# Patient Record
Sex: Male | Born: 1940
Health system: Southern US, Community
[De-identification: ages and names within clinical notes are randomized; demographics above are authoritative.]

## PROBLEM LIST (undated history)

## (undated) DIAGNOSIS — M1710 Unilateral primary osteoarthritis, unspecified knee: Secondary | ICD-10-CM

## (undated) DIAGNOSIS — Z86010 Personal history of colonic polyps: Secondary | ICD-10-CM

## (undated) DIAGNOSIS — E119 Type 2 diabetes mellitus without complications: Secondary | ICD-10-CM

## (undated) DIAGNOSIS — E785 Hyperlipidemia, unspecified: Secondary | ICD-10-CM

## (undated) DIAGNOSIS — H47019 Ischemic optic neuropathy, unspecified eye: Secondary | ICD-10-CM

## (undated) HISTORY — DX: Hyperlipidemia, unspecified: E78.5

## (undated) HISTORY — DX: Personal history of colonic polyps: Z86.010

## (undated) HISTORY — DX: Unilateral primary osteoarthritis, unspecified knee: M17.10

## (undated) HISTORY — DX: Ischemic optic neuropathy, unspecified eye: H47.019

## (undated) HISTORY — DX: Type 2 diabetes mellitus without complications: E11.9

---

## 2002-09-20 ENCOUNTER — Encounter: Payer: Self-pay | Admitting: Orthopedic Surgery

## 2002-09-20 ENCOUNTER — Inpatient Hospital Stay (HOSPITAL_COMMUNITY): Admission: RE | Admit: 2002-09-20 | Discharge: 2002-09-23 | Payer: Self-pay | Admitting: Orthopedic Surgery

## 2002-12-14 ENCOUNTER — Encounter: Payer: Self-pay | Admitting: Orthopedic Surgery

## 2002-12-14 ENCOUNTER — Encounter: Admission: RE | Admit: 2002-12-14 | Discharge: 2002-12-14 | Payer: Self-pay | Admitting: Orthopedic Surgery

## 2003-12-29 DIAGNOSIS — Z96652 Presence of left artificial knee joint: Secondary | ICD-10-CM | POA: Insufficient documentation

## 2003-12-29 HISTORY — PX: TOTAL KNEE ARTHROPLASTY: SHX125

## 2004-12-28 DIAGNOSIS — H47019 Ischemic optic neuropathy, unspecified eye: Secondary | ICD-10-CM

## 2004-12-28 HISTORY — DX: Ischemic optic neuropathy, unspecified eye: H47.019

## 2006-07-21 ENCOUNTER — Ambulatory Visit: Payer: Self-pay

## 2008-02-23 ENCOUNTER — Ambulatory Visit (HOSPITAL_COMMUNITY): Admission: RE | Admit: 2008-02-23 | Discharge: 2008-02-23 | Payer: Self-pay

## 2008-07-16 ENCOUNTER — Ambulatory Visit: Payer: Self-pay | Admitting: Internal Medicine

## 2008-07-30 ENCOUNTER — Ambulatory Visit: Payer: Self-pay | Admitting: Internal Medicine

## 2008-07-30 ENCOUNTER — Encounter: Payer: Self-pay | Admitting: Internal Medicine

## 2008-07-30 DIAGNOSIS — Z860100 Personal history of colon polyps, unspecified: Secondary | ICD-10-CM

## 2008-07-30 HISTORY — DX: Personal history of colonic polyps: Z86.010

## 2008-07-30 HISTORY — DX: Personal history of colon polyps, unspecified: Z86.0100

## 2008-08-01 ENCOUNTER — Encounter: Payer: Self-pay | Admitting: Internal Medicine

## 2008-09-27 ENCOUNTER — Ambulatory Visit: Payer: Self-pay | Admitting: Ophthalmology

## 2008-10-31 ENCOUNTER — Ambulatory Visit: Payer: Self-pay | Admitting: Ophthalmology

## 2011-04-09 ENCOUNTER — Ambulatory Visit: Payer: Self-pay

## 2011-05-15 NOTE — Op Note (Signed)
NAME:  Keith Jordan, Keith Jordan                          ACCOUNT NO.:  [id number]   MEDICAL RECORD NO.:  [id number]                   PATIENT TYPE:  INP   LOCATION:  5039                                 FACILITY:  [facility]   PHYSICIAN:  Elana Alm. Thurston Hole, M.D.              DATE OF BIRTH:  1941-05-11   DATE OF PROCEDURE:  09/20/2002  DATE OF DISCHARGE:                                 OPERATIVE REPORT   PREOPERATIVE DIAGNOSIS:  Left knee degenerative joint disease.   POSTOPERATIVE DIAGNOSIS:  Left knee degenerative joint disease.   PROCEDURE:  Left total knee replacement using Osteonics Scorpio total knee  system with #9 cemented femoral component, #9 cemented tibial component with  12 mm polyethylene tibial flex spacer, and 26-mm polyethylene cemented  patella.   SURGEON:  Elana Alm. Thurston Hole, M.D.   ASSISTANT:  Julien Girt, P.A.   ANESTHESIA:  General.   OPERATIVE TIME:  One hour and 20 minutes.   COMPLICATIONS:  None.   DESCRIPTION OF PROCEDURE:  The patient was brought to the operating room on  September 20, 2002, and placed on the operative table in the supine  position.  After an adequate level of general anesthesia was obtained, his  left knee was examined under anesthesia.  He had range of motion from -5 to  135 degrees with moderate varus deformity.  The stable was stable to  ligamentous exam with normal patella tracking.  He had a Foley catheter  placed under sterile conditions and received Ancef 1 g IV preoperatively for  prophylaxis.  His left leg was prepped using sterile DuraPrep and draped  using sterile technique.  The leg was exsanguinated and a tourniquet  elevated 350 mm.  Initially through a 20 cm longitudinal incision based over  the patella, initial exposure was made.  The underlying subcutaneous tissues  were incised in line with the skin incision.  A median arthrotomy was  performed revealing an excessive amount of normal appearing joint fluid.  The articular  surfaces were inspected.  He had grade IV changes medially,  grade III changes laterally, and grade III and IV changes in the  patellofemoral joint.  The medial and lateral meniscal remnants were removed  as well as the anterior cruciate ligament and the osteophytes removed off  the femoral condyles and tibial plateau.  Intramedullary drill was drilled  up the femoral canal for placement of the distal femoral cutting jig which  was placed in the appropriate amount of rotation and a distal 12 mm cut was  made.  The distal femur was incised.  The #9 was found to be the appropriate  size and a #9 cutting jig was placed and these cuts were made.  The proximal  tibia was exposed.  The tibial spines were removed with an oscillating saw.  An intramedullary drill drilled down the tibial canal for placement of the  proximal tibial cutting jig  and then a 4 mm proximal tibial cut was made in  the appropriate rotation.  After this was done, the Scorpio PCL cutter was  placed back on the femur and these cuts were made.  At this point then, the  #9 femoral trial was placed and the #9 tibial baseplate trial was placed  with a 12 mm polyethylene spacer.  This gave excellent alignment, excellent  stability and range of motion from 0 to 130 degrees.  The tibial baseplate  was then marked for rotation and the keel cut was made.  After this was  done, the patella was sized.  A 26 mm was found to be appropriate size and a  recessed 10 mm x 26 mm cut was made and three locking holes were placed.  After this was done, it was felt that all trial components were excellent  size, fit and stability and they were removed.  The knee was then jet  lavaged irrigated with three liters of saline solution.  The proximal tibia  was then exposed.  The #9 tibial baseplate was cemented back and was then  hammered into position with an excellent fit with excess cement being  removed from around the edges.  The #9 femoral  component was cemented back  and was hammered into position, also with an excellent fit with excess  cement being removed from around the edges.  The 12 mm polyethylene spacer  Omniflex was placed and locked on the tibial baseplate with an excellent  fit.  Range of motion tested 0 to 130 degrees with excellent stability and  correction of his varus malalignment.  The 26 mm polyethylene patella was  then locked into its recessed hole and held there with a clamp.  After the  cement hardened, patellofemoral tracking was evaluated and this was found to  be normal.  At this point, it was felt that all the components were with  excellent size, fit and stability.  Then he was further irrigated with  antibiotic solution.  The arthrotomy was closed with #1 Ethilon suture over  two median Hemovac drains.  Subcutaneous tissue was closed with 0 and 2-0  Vicryl, skin closed with skin staples, and sterile dressings were applied.  The Hemovac injected with 0.25% Marcaine with epinephrine and clamped.  The  tourniquet was released.  The patient then had a femoral nerve block placed  by Anesthesia for postoperative pain control.  He was then awakened and  taken to the recovery room in stable condition.  Needle and sponge counts  were correct x2 at the end of the case.                                               Robert A. Thurston Hole, M.D.    RAW/MEDQ  D:  09/20/2002  T:  09/22/2002  Job:  [id number]

## 2011-05-15 NOTE — Discharge Summary (Signed)
NAME:  LACEY, DOTSON                          ACCOUNT NO.:  [id number]   MEDICAL RECORD NO.:  [id number]                   PATIENT TYPE:  INP   LOCATION:  5039                                 FACILITY:  [facility]   PHYSICIAN:  Elana Alm. Thurston Hole, M.D.              DATE OF BIRTH:  14-Apr-1941   DATE OF ADMISSION:  09/20/2002  DATE OF DISCHARGE:  09/23/2002                                 DISCHARGE SUMMARY   ADMISSION DIAGNOSES:  1. Left knee end-stage degenerative joint disease.  2. High cholesterol.   DISCHARGE DIAGNOSES:  1. End-stage degenerative joint disease, left knee.  2. High cholesterol.   HISTORY OF PRESENT ILLNESS:  The patient is a 70 year old white male with a  history of bilateral end-stage DJD, left greater than right.  He has tried  anti-inflammatories, cortisone injections, and rest without success.  At  this point in time, he has pain at night, pain during the day, and pain at  rest.  He understands the risks, benefits, and possible complications of a  left total knee replacement and is without question.   HOSPITAL COURSE:  On September 20, 2002, the patient underwent a left total  knee replacement by Molly Maduro A. Thurston Hole, M.D.  He tolerated the procedure well.  On postoperative day #1, the patient was doing well without complaints.  Pain was under control with a PCA.  The hemoglobin was 12.7.  The INR was  1.3.  His drain was discontinued.  The dressing was changed.  The surgical  wound was well approximated.  TED hose were placed.  On postoperative day  #2, the patient took five steps, temperature maximum 101.1 degrees,  temperature now 98.2 degrees, the surgical wound was well approximated, his  hemoglobin was 12.6, his INR was 1.3, and he was metabolically stable with a  slightly high glucose of 173.  On postoperative day #3, the patient  progressed well, had no complaints, his surgical wound was well  approximated, his hemoglobin was 11.5, his INR was 1.2, he was  metabolically  stable with a slightly high hemoglobin of 121, and no other abnormalities.   DISPOSITION:  He was discharged to home with home health physical therapy,  home health nursing, a three-in-one bedside commode, a tub seat, and a  standard walker.  He was discharged to his son's home in [place], [place].   FOLLOW-UP:  He will follow up with Molly Maduro A. Thurston Hole, M.D., in two weeks.  The results of his next Coumadin draw will be Tuesday with results called to  Julien Girt, P.A., Dr. Eliott Nine., for Coumadin adjustment.  He  will follow up in our office in a week-and-a-half.     Kirstin Shepperson, P.A.                  Robert A. Thurston Hole, M.D.    KS/MEDQ  D:  10/03/2002  T:  10/06/2002  Job:  [id number]

## 2011-09-18 LAB — CREATININE, SERUM: Creatinine, Ser: 1

## 2011-09-18 LAB — BUN: BUN: 21

## 2012-02-09 ENCOUNTER — Ambulatory Visit (HOSPITAL_COMMUNITY)
Admission: RE | Admit: 2012-02-09 | Discharge: 2012-02-09 | Disposition: A | Discharge status: Home / Self Care | Payer: Medicare Other | Source: Ambulatory Visit

## 2012-02-09 ENCOUNTER — Other Ambulatory Visit (HOSPITAL_COMMUNITY): Payer: Self-pay

## 2012-02-09 DIAGNOSIS — R05 Cough: Secondary | ICD-10-CM

## 2012-02-09 DIAGNOSIS — R0989 Other specified symptoms and signs involving the circulatory and respiratory systems: Secondary | ICD-10-CM | POA: Insufficient documentation

## 2012-02-09 DIAGNOSIS — R059 Cough, unspecified: Secondary | ICD-10-CM | POA: Insufficient documentation

## 2013-07-27 ENCOUNTER — Encounter: Payer: Self-pay | Admitting: Internal Medicine

## 2013-09-26 ENCOUNTER — Encounter: Payer: Self-pay | Admitting: Internal Medicine

## 2013-10-18 ENCOUNTER — Encounter: Payer: Self-pay | Admitting: Internal Medicine

## 2013-12-07 ENCOUNTER — Encounter: Payer: Medicare Other | Admitting: Internal Medicine

## 2013-12-15 ENCOUNTER — Ambulatory Visit (AMBULATORY_SURGERY_CENTER): Payer: Medicare Other

## 2013-12-15 VITALS — Ht 70.0 in | Wt 160.0 lb

## 2013-12-15 DIAGNOSIS — Z860100 Personal history of colon polyps, unspecified: Secondary | ICD-10-CM

## 2013-12-15 MED ORDER — SUPREP BOWEL PREP KIT 17.5-3.13-1.6 GM/177ML PO SOLN: 1.0000 | Freq: Once | ORAL | Status: DC

## 2013-12-18 ENCOUNTER — Encounter: Payer: Self-pay | Admitting: Internal Medicine

## 2013-12-28 HISTORY — PX: COLONOSCOPY: SHX174

## 2014-01-05 ENCOUNTER — Ambulatory Visit (AMBULATORY_SURGERY_CENTER): Payer: Medicare HMO | Admitting: Internal Medicine

## 2014-01-05 ENCOUNTER — Encounter: Payer: Self-pay | Admitting: Internal Medicine

## 2014-01-05 VITALS — BP 107/69 | HR 64 | Temp 97.7°F | Resp 15 | Ht 70.0 in | Wt 160.0 lb

## 2014-01-05 DIAGNOSIS — K5730 Diverticulosis of large intestine without perforation or abscess without bleeding: Secondary | ICD-10-CM

## 2014-01-05 DIAGNOSIS — Z86010 Personal history of colonic polyps: Secondary | ICD-10-CM

## 2014-01-05 MED ORDER — SODIUM CHLORIDE 0.9 % IV SOLN: 500.0000 mL | INTRAVENOUS | Status: DC

## 2014-01-05 NOTE — Assessment & Plan Note (Addendum)
07/2008 - 6 mm sigmoid adenoma 01/05/2014 no polyps - does not need further routine colonoscopy given age and hx of only one small adenoma Gatha Mayer, MD, [facility]

## 2014-01-05 NOTE — Progress Notes (Signed)
Stable to RR 

## 2014-01-05 NOTE — Patient Instructions (Addendum)
No polyps today! You do have a condition called diverticulosis - common and not usually a problem. Please read the handout provided.  I do not recommend further routine colonoscopy given your age and history.  I appreciate the opportunity to care for you. Gatha Mayer, MD, FACG  YOU HAD AN ENDOSCOPIC PROCEDURE TODAY AT [facility]: Refer to the procedure report that was given to you for any specific questions about what was found during the examination.  If the procedure report does not answer your questions, please call your gastroenterologist to clarify.  If you requested that your care partner not be given the details of your procedure findings, then the procedure report has been included in a sealed envelope for you to review at your convenience later.  YOU SHOULD EXPECT: Some feelings of bloating in the abdomen. Passage of more gas than usual.  Walking can help get rid of the air that was put into your GI tract during the procedure and reduce the bloating. If you had a lower endoscopy (such as a colonoscopy or flexible sigmoidoscopy) you may notice spotting of blood in your stool or on the toilet paper. If you underwent a bowel prep for your procedure, then you may not have a normal bowel movement for a few days.  DIET: Your first meal following the procedure should be a light meal and then it is ok to progress to your normal diet.  A half-sandwich or bowl of soup is an example of a good first meal.  Heavy or fried foods are harder to digest and may make you feel nauseous or bloated.  Likewise meals heavy in dairy and vegetables can cause extra gas to form and this can also increase the bloating.  Drink plenty of fluids but you should avoid alcoholic beverages for 24 hours.  ACTIVITY: Your care partner should take you home directly after the procedure.  You should plan to take it easy, moving slowly for the rest of the day.  You can resume normal activity the day after the  procedure however you should NOT DRIVE or use heavy machinery for 24 hours (because of the sedation medicines used during the test).    SYMPTOMS TO REPORT IMMEDIATELY: A gastroenterologist can be reached at any hour.  During normal business hours, 8:30 AM to 5:00 PM Monday through Friday, call [phone].  After hours and on weekends, please call the GI answering service at [phone] who will take a message and have the physician on call contact you.   Following lower endoscopy (colonoscopy or flexible sigmoidoscopy):  Excessive amounts of blood in the stool  Significant tenderness or worsening of abdominal pains  Swelling of the abdomen that is new, acute  Fever of 100F or higher   FOLLOW UP: If any biopsies were taken you will be contacted by phone or by letter within the next 1-3 weeks.  Call your gastroenterologist if you have not heard about the biopsies in 3 weeks.  Our staff will call the home number listed on your records the next business day following your procedure to check on you and address any questions or concerns that you may have at that time regarding the information given to you following your procedure. This is a courtesy call and so if there is no answer at the home number and we have not heard from you through the emergency physician on call, we will assume that you have returned to your regular daily activities without  incident.  SIGNATURES/CONFIDENTIALITY: You and/or your care partner have signed paperwork which will be entered into your electronic medical record.  These signatures attest to the fact that that the information above on your After Visit Summary has been reviewed and is understood.  Full responsibility of the confidentiality of this discharge information lies with you and/or your care-partner.  Diverticulosis-handout given  Given age and last to two colonoscopies, routing screening colonoscopy is not recommended.

## 2014-01-05 NOTE — Op Note (Signed)
[address] [place], [address]   COLONOSCOPY PROCEDURE REPORT  PATIENT: Keith Jordan, Keith Jordan  MR#: [id number] BIRTHDATE: 1941/09/12 , 72  yrs. old GENDER: Male ENDOSCOPIST: Gatha Mayer, MD, [facility] PROCEDURE DATE:  01/05/2014 PROCEDURE:   Colonoscopy, surveillance First Screening Colonoscopy - Avg.  risk and is 50 yrs.  old or older - No.  Prior Negative Screening - Now for repeat screening. N/A  History of Adenoma - Now for follow-up colonoscopy & has been > or = to 3 yrs.  Yes hx of adenoma.  Has been 3 or more years since last colonoscopy.  Polyps Removed Today? No.  Recommend repeat exam, <10 yrs? No. ASA CLASS:   Class II INDICATIONS:Patient's personal history of adenomatous colon polyps.  MEDICATIONS: propofol (Diprivan) 200mg  IV, MAC sedation, administered by CRNA, and These medications were titrated to patient response per physician's verbal order  DESCRIPTION OF PROCEDURE:   After the risks benefits and alternatives of the procedure were thoroughly explained, informed consent was obtained.  A digital rectal exam revealed no abnormalities of the rectum, A digital rectal exam revealed no prostatic nodules, and A digital rectal exam revealed the prostate was not enlarged.   The LB [id number]  endoscope was introduced through the anus and advanced to the cecum, which was identified by both the appendix and ileocecal valve. No adverse events experienced.   The quality of the prep was excellent using Suprep  The instrument was then slowly withdrawn as the colon was fully examined.   COLON FINDINGS: Moderate diverticulosis was noted in the sigmoid colon.   The colon mucosa was otherwise normal.   A right colon retroflexion was performed.  Retroflexed views revealed no abnormalities. The time to cecum=1 minutes 54 seconds.  Withdrawal time=8 minutes 12 seconds.  The scope was withdrawn and the procedure completed. COMPLICATIONS: There were  no complications.  ENDOSCOPIC IMPRESSION: 1.   Moderate diverticulosis was noted in the sigmoid colon 2.   The colon mucosa was otherwise normal - excellent prep - hx 6 mm sigmoid adenoma 2009  RECOMMENDATIONS: GI and colonoscopy f/u prn at his age and with last 2 colonoscopy results would not recommend routine repeat colonoscopy per current guidelines   eSigned:  Gatha Mayer, MD, [facility] 01/05/2014 10:05 AM   cc: The Patient and Laurian Brim, MD

## 2014-01-08 ENCOUNTER — Telehealth: Payer: Self-pay | Admitting: *Deleted

## 2014-01-08 NOTE — Telephone Encounter (Signed)
Number identifier, left message, follow-up  

## 2014-04-16 LAB — HEMOGLOBIN A1C: HEMOGLOBIN A1C: 6.5

## 2014-10-16 LAB — PSA: PSA: 3.05

## 2015-01-15 LAB — HEPATIC FUNCTION PANEL
ALT: 13
AST: 21 U/L
Alkaline Phosphatase: 73 U/L
BILIRUBIN, TOTAL: 0.7

## 2015-04-16 LAB — LIPID PANEL
CHOLESTEROL: 140
Cholesterol: 140
HDL: 35 mg/dL (ref 35–70)
HDL: 35 mg/dL (ref 35–70)
LDL (calc): 86
LDL CALC: 86
TRIGLYCERIDES: 94
Triglycerides: 94

## 2015-04-16 LAB — BASIC METABOLIC PANEL
BUN: 31 mg/dL — AB (ref 4–21)
CREATININE: 1.1
Calcium: 9 mg/dL
Creat: 1.1
GLUCOSE: 118
Glucose: 118
POTASSIUM: 4.5 mmol/L
Potassium: 4.5 mmol/L
SODIUM: 139
SODIUM: 139

## 2015-07-30 ENCOUNTER — Encounter: Payer: Self-pay | Admitting: Internal Medicine

## 2015-10-03 DIAGNOSIS — Z23 Encounter for immunization: Secondary | ICD-10-CM | POA: Diagnosis not present

## 2015-10-15 DIAGNOSIS — I10 Essential (primary) hypertension: Secondary | ICD-10-CM | POA: Diagnosis not present

## 2015-10-15 DIAGNOSIS — E119 Type 2 diabetes mellitus without complications: Secondary | ICD-10-CM | POA: Diagnosis not present

## 2015-10-15 DIAGNOSIS — E785 Hyperlipidemia, unspecified: Secondary | ICD-10-CM | POA: Diagnosis not present

## 2016-03-04 DIAGNOSIS — R69 Illness, unspecified: Secondary | ICD-10-CM | POA: Diagnosis not present

## 2016-03-18 DIAGNOSIS — H47011 Ischemic optic neuropathy, right eye: Secondary | ICD-10-CM | POA: Diagnosis not present

## 2016-05-08 DIAGNOSIS — I10 Essential (primary) hypertension: Secondary | ICD-10-CM | POA: Diagnosis not present

## 2016-06-01 ENCOUNTER — Encounter: Payer: Self-pay | Admitting: Family Medicine

## 2016-06-01 ENCOUNTER — Ambulatory Visit (INDEPENDENT_AMBULATORY_CARE_PROVIDER_SITE_OTHER): Payer: Medicare HMO | Admitting: Family Medicine

## 2016-06-01 VITALS — BP 124/70 | HR 69 | Temp 98.1°F | Ht 70.0 in | Wt 162.8 lb

## 2016-06-01 DIAGNOSIS — H47019 Ischemic optic neuropathy, unspecified eye: Secondary | ICD-10-CM | POA: Diagnosis not present

## 2016-06-01 DIAGNOSIS — E1169 Type 2 diabetes mellitus with other specified complication: Secondary | ICD-10-CM | POA: Insufficient documentation

## 2016-06-01 DIAGNOSIS — M1710 Unilateral primary osteoarthritis, unspecified knee: Secondary | ICD-10-CM

## 2016-06-01 DIAGNOSIS — E785 Hyperlipidemia, unspecified: Secondary | ICD-10-CM

## 2016-06-01 MED ORDER — ATORVASTATIN CALCIUM 10 MG PO TABS: 10.0000 mg | ORAL_TABLET | Freq: Every day | ORAL | Status: DC

## 2016-06-01 NOTE — Assessment & Plan Note (Signed)
Will review records he brings. States last eye exam ~02/2016.

## 2016-06-01 NOTE — Patient Instructions (Signed)
I'll review records from Dr Hardin Negus and return when done. Return as needed or in 3-4 months for medicare wellness visit Good to meet you today, call us with questions.

## 2016-06-01 NOTE — Assessment & Plan Note (Signed)
Continue f/u with ortho. Will let us know if needs new ortho referral.

## 2016-06-01 NOTE — Progress Notes (Signed)
   BP 124/70 mmHg  Pulse 69  Temp(Src) 98.1 F (36.7 C)  Ht 5\' 10"  (1.778 m)  Wt 162 lb 12.8 oz (73.846 kg)  BMI 23.36 kg/m2  SpO2 97%   CC: new pt to establish  Subjective:    Patient ID: Keith Jordan, male    DOB: Nov 20, 1941, 75 y.o.   MRN: [id number]  HPI: Keith Jordan is a 76 y.o. male presenting on 06/01/2016 for Establish Care   Prior saw Dr Hardin Negus who has passed away. Brings records with him, requests we return when completed.  HLD - compliant with atorvastatin 10mg  daily without myalgias.   Some R vision loss - ?due to ministroke. Sees [facility].   Knee OA - s/p L knee replacement. Dr Noemi Chapel may not receive Aenta. Receiving lubricating shots into R knee.   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 Interim medical history since our last visit reviewed. Allergies and medications reviewed and updated. No current outpatient prescriptions on file prior to visit.   No current facility-administered medications on file prior to visit.    Review of Systems Per HPI unless specifically indicated in ROS section     Objective:    BP 124/70 mmHg  Pulse 69  Temp(Src) 98.1 F (36.7 C)  Ht 5\' 10"  (1.778 m)  Wt 162 lb 12.8 oz (73.846 kg)  BMI 23.36 kg/m2  SpO2 97%  Wt Readings from Last 3 Encounters:  06/01/16 162 lb 12.8 oz (73.846 kg)  01/05/14 160 lb (72.576 kg)  12/15/13 160 lb (72.576 kg)    Physical Exam  Constitutional: He appears well-developed and well-nourished. No distress.  HENT:  Mouth/Throat: Oropharynx is clear and moist. No oropharyngeal exudate.  Eyes: Conjunctivae and EOM are normal. Pupils are equal, round, and reactive to light. No scleral icterus.  Neck: Normal range of motion. Neck  supple. Carotid bruit is not present. No thyromegaly present.  Cardiovascular: Normal rate, regular rhythm, normal heart sounds and intact distal pulses.   No murmur heard. Pulmonary/Chest: Effort normal and breath sounds normal. No respiratory distress. He has no wheezes. He has no rales.  Musculoskeletal: He exhibits no edema.  Lymphadenopathy:    He has no cervical adenopathy.  Skin: Skin is warm and dry. No rash noted.  Psychiatric: He has a normal mood and affect.  Nursing note and vitals reviewed.     Assessment & Plan:   Problem List Items Addressed This Visit    Hyperlipidemia - Primary    Chronic, stable on lipitor. Check FLP next visit.      Relevant Medications   atorvastatin (LIPITOR) 10 MG tablet   Primary osteoarthritis of knee    Continue f/u with ortho. Will let us know if needs new ortho referral.      Ischemic optic neuropathy    Will review records he brings. States last eye exam ~02/2016.           Follow up plan: Return in about 3 months (around 09/01/2016), or as needed, for medicare wellness visit.  [name], MD

## 2016-06-01 NOTE — Progress Notes (Signed)
Pre visit review using our clinic review tool, if applicable. No additional management support is needed unless otherwise documented below in the visit note. 

## 2016-06-01 NOTE — Assessment & Plan Note (Signed)
Chronic, stable on lipitor. Check FLP next visit.

## 2016-07-05 ENCOUNTER — Encounter: Payer: Self-pay | Admitting: Family Medicine

## 2016-07-05 DIAGNOSIS — E119 Type 2 diabetes mellitus without complications: Secondary | ICD-10-CM

## 2016-07-05 DIAGNOSIS — R7303 Prediabetes: Secondary | ICD-10-CM

## 2016-07-05 DIAGNOSIS — E1169 Type 2 diabetes mellitus with other specified complication: Secondary | ICD-10-CM | POA: Insufficient documentation

## 2016-07-05 HISTORY — DX: Type 2 diabetes mellitus without complications: E11.9

## 2016-07-05 HISTORY — DX: Prediabetes: R73.03

## 2016-07-28 ENCOUNTER — Encounter: Payer: Self-pay | Admitting: *Deleted

## 2016-08-19 ENCOUNTER — Encounter: Payer: Self-pay | Admitting: *Deleted

## 2016-08-24 ENCOUNTER — Other Ambulatory Visit: Payer: Self-pay | Admitting: Family Medicine

## 2016-08-24 DIAGNOSIS — E785 Hyperlipidemia, unspecified: Secondary | ICD-10-CM

## 2016-08-24 DIAGNOSIS — Z125 Encounter for screening for malignant neoplasm of prostate: Secondary | ICD-10-CM

## 2016-08-24 DIAGNOSIS — R7303 Prediabetes: Secondary | ICD-10-CM

## 2016-08-26 ENCOUNTER — Other Ambulatory Visit (INDEPENDENT_AMBULATORY_CARE_PROVIDER_SITE_OTHER): Payer: Medicare HMO

## 2016-08-26 ENCOUNTER — Other Ambulatory Visit: Payer: Medicare HMO

## 2016-08-26 ENCOUNTER — Ambulatory Visit: Payer: Medicare HMO

## 2016-08-26 DIAGNOSIS — R7303 Prediabetes: Secondary | ICD-10-CM

## 2016-08-26 DIAGNOSIS — Z125 Encounter for screening for malignant neoplasm of prostate: Secondary | ICD-10-CM | POA: Diagnosis not present

## 2016-08-26 DIAGNOSIS — E785 Hyperlipidemia, unspecified: Secondary | ICD-10-CM

## 2016-08-26 LAB — LIPID PANEL
CHOL/HDL RATIO: 5
CHOLESTEROL: 175 mg/dL (ref 0–200)
HDL: 33.5 mg/dL — AB (ref >39.00)
LDL CALC: 114 mg/dL — AB (ref 0–99)
NonHDL: 141.96
Triglycerides: 142 mg/dL (ref 0.0–149.0)
VLDL: 28.4 mg/dL (ref 0.0–40.0)

## 2016-08-26 LAB — BASIC METABOLIC PANEL
BUN: 24 mg/dL — AB (ref 6–23)
CALCIUM: 9.3 mg/dL (ref 8.4–10.5)
CO2: 34 meq/L — AB (ref 19–32)
Chloride: 100 mEq/L (ref 96–112)
Creatinine, Ser: 1.11 mg/dL (ref 0.40–1.50)
GFR: 68.74 mL/min (ref >60.00)
GLUCOSE: 123 mg/dL — AB (ref 70–99)
Potassium: 4.6 mEq/L (ref 3.5–5.1)
SODIUM: 140 meq/L (ref 135–145)

## 2016-08-26 LAB — HEMOGLOBIN A1C: Hgb A1c MFr Bld: 6.3 % (ref 4.6–6.5)

## 2016-08-26 LAB — PSA, MEDICARE: PSA: 3.23 ng/ml (ref 0.10–4.00)

## 2016-09-01 ENCOUNTER — Encounter: Payer: Self-pay | Admitting: Family Medicine

## 2016-09-01 ENCOUNTER — Ambulatory Visit (INDEPENDENT_AMBULATORY_CARE_PROVIDER_SITE_OTHER): Payer: Medicare HMO | Admitting: Family Medicine

## 2016-09-01 VITALS — BP 142/76 | HR 70 | Temp 98.4°F | Ht 68.25 in | Wt 163.0 lb

## 2016-09-01 DIAGNOSIS — E785 Hyperlipidemia, unspecified: Secondary | ICD-10-CM

## 2016-09-01 DIAGNOSIS — Z0000 Encounter for general adult medical examination without abnormal findings: Secondary | ICD-10-CM | POA: Insufficient documentation

## 2016-09-01 DIAGNOSIS — R7303 Prediabetes: Secondary | ICD-10-CM

## 2016-09-01 DIAGNOSIS — Z7189 Other specified counseling: Secondary | ICD-10-CM | POA: Diagnosis not present

## 2016-09-01 DIAGNOSIS — Z0001 Encounter for general adult medical examination with abnormal findings: Secondary | ICD-10-CM | POA: Insufficient documentation

## 2016-09-01 HISTORY — DX: Encounter for general adult medical examination without abnormal findings: Z00.00

## 2016-09-01 HISTORY — DX: Other specified counseling: Z71.89

## 2016-09-01 NOTE — Progress Notes (Signed)
Pre visit review using our clinic review tool, if applicable. No additional management support is needed unless otherwise documented below in the visit note. 

## 2016-09-01 NOTE — Assessment & Plan Note (Signed)
Chronic, stable. Discussed avoiding added sugars in diet.

## 2016-09-01 NOTE — Patient Instructions (Addendum)
Flu shot today Check at [facility] on recent immunizations and let us know to update your chart (pneumonia shot).  Call your insurance about the shingles shot to see if it is covered or how much it would cost and where is cheaper (here or pharmacy).  If you want to receive here, call for nurse visit.  Bring me copy of your advanced directive.  You are doing well today Return as needed or in 1 year for next physical.  Health Maintenance, Male A healthy lifestyle and preventative care can promote health and wellness.  Maintain regular health, dental, and eye exams.  Eat a healthy diet. Foods like vegetables, fruits, whole grains, low-fat dairy products, and lean protein foods contain the nutrients you need and are low in calories. Decrease your intake of foods high in solid fats, added sugars, and salt. Get information about a proper diet from your health care provider, if necessary.  Regular physical exercise is one of the most important things you can do for your health. Most adults should get at least 150 minutes of moderate-intensity exercise (any activity that increases your heart rate and causes you to sweat) each week. In addition, most adults need muscle-strengthening exercises on 2 or more days a week.   Maintain a healthy weight. The body mass index (BMI) is a screening tool to identify possible weight problems. It provides an estimate of body fat based on height and weight. Your health care provider can find your BMI and can help you achieve or maintain a healthy weight. For males 20 years and older:  A BMI below 18.5 is considered underweight.  A BMI of 18.5 to 24.9 is normal.  A BMI of 25 to 29.9 is considered overweight.  A BMI of 30 and above is considered obese.  Maintain normal blood lipids and cholesterol by exercising and minimizing your intake of saturated fat. Eat a balanced diet with plenty of fruits and vegetables. Blood tests for lipids and cholesterol should begin at age 63  and be repeated every 5 years. If your lipid or cholesterol levels are high, you are over age 28, or you are at high risk for heart disease, you may need your cholesterol levels checked more frequently.Ongoing high lipid and cholesterol levels should be treated with medicines if diet and exercise are not working.  If you smoke, find out from your health care provider how to quit. If you do not use tobacco, do not start.  Lung cancer screening is recommended for adults aged 34-80 years who are at high risk for developing lung cancer because of a history of smoking. A yearly low-dose CT scan of the lungs is recommended for people who have at least a 30-pack-year history of smoking and are current smokers or have quit within the past 15 years. A pack year of smoking is smoking an average of 1 pack of cigarettes a day for 1 year (for example, a 30-pack-year history of smoking could mean smoking 1 pack a day for 30 years or 2 packs a day for 15 years). Yearly screening should continue until the smoker has stopped smoking for at least 15 years. Yearly screening should be stopped for people who develop a health problem that would prevent them from having lung cancer treatment.  If you choose to drink alcohol, do not have more than 2 drinks per day. One drink is considered to be 12 oz (360 mL) of beer, 5 oz (150 mL) of wine, or 1.5 oz (45 mL)  of liquor.  Avoid the use of street drugs. Do not share needles with anyone. Ask for help if you need support or instructions about stopping the use of drugs.  High blood pressure causes heart disease and increases the risk of stroke. High blood pressure is more likely to develop in:  People who have blood pressure in the end of the normal range (100-139/85-89 mm Hg).  People who are overweight or obese.  People who are African American.  If you are 34-51 years of age, have your blood pressure checked every 3-5 years. If you are 65 years of age or older, have your  blood pressure checked every year. You should have your blood pressure measured twice--once when you are at a hospital or clinic, and once when you are not at a hospital or clinic. Record the average of the two measurements. To check your blood pressure when you are not at a hospital or clinic, you can use:  An automated blood pressure machine at a pharmacy.  A home blood pressure monitor.  If you are 62-50 years old, ask your health care provider if you should take aspirin to prevent heart disease.  Diabetes screening involves taking a blood sample to check your fasting blood sugar level. This should be done once every 3 years after age 45 if you are at a normal weight and without risk factors for diabetes. Testing should be considered at a younger age or be carried out more frequently if you are overweight and have at least 1 risk factor for diabetes.  Colorectal cancer can be detected and often prevented. Most routine colorectal cancer screening begins at the age of 74 and continues through age 54. However, your health care provider may recommend screening at an earlier age if you have risk factors for colon cancer. On a yearly basis, your health care provider may provide home test kits to check for hidden blood in the stool. A small camera at the end of a tube may be used to directly examine the colon (sigmoidoscopy or colonoscopy) to detect the earliest forms of colorectal cancer. Talk to your health care provider about this at age 49 when routine screening begins. A direct exam of the colon should be repeated every 5-10 years through age 86, unless early forms of precancerous polyps or small growths are found.  People who are at an increased risk for hepatitis B should be screened for this virus. You are considered at high risk for hepatitis B if:  You were born in a country where hepatitis B occurs often. Talk with your health care provider about which countries are considered high risk.  Your  parents were born in a high-risk country and you have not received a shot to protect against hepatitis B (hepatitis B vaccine).  You have HIV or AIDS.  You use needles to inject street drugs.  You live with, or have sex with, someone who has hepatitis B.  You are a man who has sex with other men (MSM).  You get hemodialysis treatment.  You take certain medicines for conditions like cancer, organ transplantation, and autoimmune conditions.  Hepatitis C blood testing is recommended for all people born from [birth year] through 1964 and any individual with known risk factors for hepatitis C.  Healthy men should no longer receive prostate-specific antigen (PSA) blood tests as part of routine cancer screening. Talk to your health care provider about prostate cancer screening.  Testicular cancer screening is not recommended for adolescents or  adult males who have no symptoms. Screening includes self-exam, a health care provider exam, and other screening tests. Consult with your health care provider about any symptoms you have or any concerns you have about testicular cancer.  Practice safe sex. Use condoms and avoid high-risk sexual practices to reduce the spread of sexually transmitted infections (STIs).  You should be screened for STIs, including gonorrhea and chlamydia if:  You are sexually active and are younger than 24 years.  You are older than 24 years, and your health care provider tells you that you are at risk for this type of infection.  Your sexual activity has changed since you were last screened, and you are at an increased risk for chlamydia or gonorrhea. Ask your health care provider if you are at risk.  If you are at risk of being infected with HIV, it is recommended that you take a prescription medicine daily to prevent HIV infection. This is called pre-exposure prophylaxis (PrEP). You are considered at risk if:  You are a man who has sex with other men (MSM).  You are a  heterosexual man who is sexually active with multiple partners.  You take drugs by injection.  You are sexually active with a partner who has HIV.  Talk with your health care provider about whether you are at high risk of being infected with HIV. If you choose to begin PrEP, you should first be tested for HIV. You should then be tested every 3 months for as long as you are taking PrEP.  Use sunscreen. Apply sunscreen liberally and repeatedly throughout the day. You should seek shade when your shadow is shorter than you. Protect yourself by wearing long sleeves, pants, a wide-brimmed hat, and sunglasses year round whenever you are outdoors.  Tell your health care provider of new moles or changes in moles, especially if there is a change in shape or color. Also, tell your health care provider if a mole is larger than the size of a pencil eraser.  A one-time screening for abdominal aortic aneurysm (AAA) and surgical repair of large AAAs by ultrasound is recommended for men aged 13-75 years who are current or former smokers.  Stay current with your vaccines (immunizations).   This information is not intended to replace advice given to you by your health care provider. Make sure you discuss any questions you have with your health care provider.   Document Released: 06/11/2008 Document Revised: 01/04/2015 Document Reviewed: 05/11/2011 Elsevier Interactive Patient Education [employer].

## 2016-09-01 NOTE — Assessment & Plan Note (Signed)
Advanced directive discussion - has at home. Son has copy. Son is Quincey Gornick is HCPOA. Asked to bring copy.  

## 2016-09-01 NOTE — Progress Notes (Signed)
BP (!) 142/76   Pulse 70   Temp 98.4 F (36.9 C) (Oral)   Ht 5' 8.25" (1.734 m)   Wt 163 lb (73.9 kg)   SpO2 97%   BMI 24.60 kg/m    CC: medicare wellness visit Subjective:    Patient ID: Keith Jordan, male    DOB: 1941-04-30, 75 y.o.   MRN: [id number]  HPI: Keith Jordan is a 75 y.o. male presenting on 09/01/2016 for Annual Exam   Pending appt with Keith Jordan for medicare wellness visit next week.   Preventative: COLONOSCOPY Date: 12/2013 no polyps, no rpt due Keith Jordan) Prostate cancer screening - discussed. Will consider DRE Lung cancer screening - never smoker Flu shot - yearly Tetanus shot - unsure [facility], unsure type Shingles shot - advised to check with insurance.  Advanced directive discussion - has at home. Son has copy. Son is Keith Jordan is HCPOA. Asked to bring copy.  Seat belt use discussed Sunscreen use and skin screen discussed  Non smoker Alcohol use - 1 glass wine in evening  Lives with wife, 2 cats  Occ: retired Personal assistant  Activity: works in yard  Diet: some water, fruits/vegetables daily   Relevant past medical, surgical, family and social history reviewed and updated as indicated. Interim medical history since our last visit reviewed. Allergies and medications reviewed and updated. Current Outpatient Prescriptions on File Prior to Visit  Medication Sig  . atorvastatin (LIPITOR) 10 MG tablet Take 1 tablet (10 mg total) by mouth daily.   No current facility-administered medications on file prior to visit.     Review of Systems  Constitutional: Negative for activity change, appetite change, chills, fatigue, fever and unexpected weight change.  HENT: Negative for hearing loss.   Eyes: Negative for visual disturbance.  Respiratory: Negative for cough, chest tightness, shortness of breath and wheezing.   Cardiovascular: Negative for chest pain, palpitations and leg swelling.  Gastrointestinal: Negative for abdominal distention,  abdominal pain, blood in stool, constipation, diarrhea, nausea and vomiting.  Genitourinary: Negative for difficulty urinating and hematuria.  Musculoskeletal: Negative for arthralgias, myalgias and neck pain.  Skin: Negative for rash.  Neurological: Negative for dizziness, seizures, syncope and headaches.  Hematological: Negative for adenopathy. Does not bruise/bleed easily.  Psychiatric/Behavioral: Negative for dysphoric mood. The patient is not nervous/anxious.    Per HPI unless specifically indicated in ROS section     Objective:    BP (!) 142/76   Pulse 70   Temp 98.4 F (36.9 C) (Oral)   Ht 5' 8.25" (1.734 m)   Wt 163 lb (73.9 kg)   SpO2 97%   BMI 24.60 kg/m   Wt Readings from Last 3 Encounters:  09/01/16 163 lb (73.9 kg)  06/01/16 162 lb 12.8 oz (73.8 kg)  01/05/14 160 lb (72.6 kg)    Physical Exam  Constitutional: He is oriented to person, place, and time. He appears well-developed and well-nourished. No distress.  HENT:  Head: Normocephalic and atraumatic.  Right Ear: Hearing, tympanic membrane, external ear and ear canal normal.  Left Ear: Hearing, tympanic membrane, external ear and ear canal normal.  Nose: Nose normal.  Mouth/Throat: Uvula is midline, oropharynx is clear and moist and mucous membranes are normal. No oropharyngeal exudate, posterior oropharyngeal edema or posterior oropharyngeal erythema.  Eyes: Conjunctivae and EOM are normal. Pupils are equal, round, and reactive to light. No scleral icterus.  Neck: Normal range of motion. Neck supple. Carotid bruit is not present. No  thyromegaly present.  Cardiovascular: Normal rate, regular rhythm, normal heart sounds and intact distal pulses.   No murmur heard. Pulses:      Radial pulses are 2+ on the right side, and 2+ on the left side.  Pulmonary/Chest: Effort normal and breath sounds normal. No respiratory distress. He has no wheezes. He has no rales.  Abdominal: Soft. Bowel sounds are normal. He exhibits  no distension and no mass. There is no tenderness. There is no rebound and no guarding.  Genitourinary: Rectum normal and prostate normal. Rectal exam shows no external hemorrhoid, no internal hemorrhoid, no fissure, no mass, no tenderness and anal tone normal. Prostate is not enlarged (20gm) and not tender.  Musculoskeletal: Normal range of motion. He exhibits no edema.  Lymphadenopathy:    He has no cervical adenopathy.  Neurological: He is alert and oriented to person, place, and time.  CN grossly intact, station and gait intact  Skin: Skin is warm and dry. No rash noted.  Psychiatric: He has a normal mood and affect. His behavior is normal. Judgment and thought content normal.  Nursing note and vitals reviewed.  Results for orders placed or performed in visit on 08/26/16  Lipid panel  Result Value Ref Range   Cholesterol 175 0 - 200 mg/dL   Triglycerides 142.0 0.0 - 149.0 mg/dL   HDL 33.50 (L) >39.00 mg/dL   VLDL 28.4 0.0 - 40.0 mg/dL   LDL Cholesterol 114 (H) 0 - 99 mg/dL   Total CHOL/HDL Ratio 5    NonHDL 141.96   Hemoglobin A1c  Result Value Ref Range   Hgb A1c MFr Bld 6.3 4.6 - 6.5 %  Basic metabolic panel  Result Value Ref Range   Sodium 140 135 - 145 mEq/L   Potassium 4.6 3.5 - 5.1 mEq/L   Chloride 100 96 - 112 mEq/L   CO2 34 (H) 19 - 32 mEq/L   Glucose, Bld 123 (H) 70 - 99 mg/dL   BUN 24 (H) 6 - 23 mg/dL   Creatinine, Ser 1.11 0.40 - 1.50 mg/dL   Calcium 9.3 8.4 - 10.5 mg/dL   GFR 68.74 >60.00 mL/min  PSA, Medicare  Result Value Ref Range   PSA 3.23 0.10 - 4.00 ng/ml      Assessment & Plan:   Problem List Items Addressed This Visit    Advanced care planning/counseling discussion    Advanced directive discussion - has at home. Son has copy. Son is Keith Jordan is HCPOA. Asked to bring copy.       Health maintenance examination - Primary    Preventative protocols reviewed and updated unless pt declined. Discussed healthy diet and lifestyle.        Hyperlipidemia    Chronic, stable. Continue lipitor 10mg  daily. Not regular with this.       Prediabetes    Chronic, stable. Discussed avoiding added sugars in diet.        Other Visit Diagnoses   None.      Follow up plan: Return in about 1 year (around 09/01/2017), or as needed, for medicare wellness visit.  [name], MD

## 2016-09-01 NOTE — Assessment & Plan Note (Signed)
Preventative protocols reviewed and updated unless pt declined. Discussed healthy diet and lifestyle.  

## 2016-09-01 NOTE — Assessment & Plan Note (Signed)
Chronic, stable. Continue lipitor 10mg  daily. Not regular with this.

## 2016-09-08 DIAGNOSIS — Z23 Encounter for immunization: Secondary | ICD-10-CM | POA: Diagnosis not present

## 2016-09-09 ENCOUNTER — Ambulatory Visit: Payer: Medicare HMO

## 2016-09-09 DIAGNOSIS — R69 Illness, unspecified: Secondary | ICD-10-CM | POA: Diagnosis not present

## 2016-09-11 ENCOUNTER — Ambulatory Visit (INDEPENDENT_AMBULATORY_CARE_PROVIDER_SITE_OTHER): Payer: Medicare HMO

## 2016-09-11 VITALS — BP 130/78 | HR 79 | Temp 98.8°F | Ht 68.0 in | Wt 162.8 lb

## 2016-09-11 DIAGNOSIS — Z0000 Encounter for general adult medical examination without abnormal findings: Secondary | ICD-10-CM | POA: Diagnosis not present

## 2016-09-11 NOTE — Patient Instructions (Signed)
Mr. Keith Jordan , Thank you for taking time to come for your Medicare Wellness Visit. I appreciate your ongoing commitment to your health goals. Please review the following plan we discussed and let me know if I can assist you in the future.   These are the goals we discussed: Goals    . Increase physical activity          Starting 09/11/2016, I will continue to exercise for at least 30 min 3 days per week.        This is a list of the screening recommended for you and due dates:  Health Maintenance  Topic Date Due  . DTaP/Tdap/Td vaccine (1 - Tdap) 09/11/2017*  . Complete foot exam   09/11/2017*  . Urine Protein Check  09/11/2017*  . Shingles Vaccine  09/11/2017*  . Tetanus Vaccine  09/11/2017*  . Hemoglobin A1C  02/24/2017  . Eye exam for diabetics  03/19/2017  . Pneumonia vaccines (2 of 2 - PPSV23) 09/08/2017  . Colon Cancer Screening  01/06/2024  . Flu Shot  Completed  *Topic was postponed. The date shown is not the original due date.   Preventive Care for Adults  A healthy lifestyle and preventive care can promote health and wellness. Preventive health guidelines for adults include the following key practices.  . A routine yearly physical is a good way to check with your health care provider about your health and preventive screening. It is a chance to share any concerns and updates on your health and to receive a thorough exam.  . Visit your dentist for a routine exam and preventive care every 6 months. Brush your teeth twice a day and floss once a day. Good oral hygiene prevents tooth decay and gum disease.  . The frequency of eye exams is based on your age, health, family medical history, use  of contact lenses, and other factors. Follow your health care provider's ecommendations for frequency of eye exams.  . Eat a healthy diet. Foods like vegetables, fruits, whole grains, low-fat dairy products, and lean protein foods contain the nutrients you need without too many  calories. Decrease your intake of foods high in solid fats, added sugars, and salt. Eat the right amount of calories for you. Get information about a proper diet from your health care provider, if necessary.  . Regular physical exercise is one of the most important things you can do for your health. Most adults should get at least 150 minutes of moderate-intensity exercise (any activity that increases your heart rate and causes you to sweat) each week. In addition, most adults need muscle-strengthening exercises on 2 or more days a week.  Silver Sneakers may be a benefit available to you. To determine eligibility, you may visit the website: www.silversneakers.com or contact program at 361-355-6530 Mon-Fri between 8AM-8PM.   . Maintain a healthy weight. The body mass index (BMI) is a screening tool to identify possible weight problems. It provides an estimate of body fat based on height and weight. Your health care provider can find your BMI and can help you achieve or maintain a healthy weight.   For adults 20 years and older: ? A BMI below 18.5 is considered underweight. ? A BMI of 18.5 to 24.9 is normal. ? A BMI of 25 to 29.9 is considered overweight. ? A BMI of 30 and above is considered obese.   . Maintain normal blood lipids and cholesterol levels by exercising and minimizing your intake of saturated fat. Eat a  balanced diet with plenty of fruit and vegetables. Blood tests for lipids and cholesterol should begin at age 44 and be repeated every 5 years. If your lipid or cholesterol levels are high, you are over 50, or you are at high risk for heart disease, you may need your cholesterol levels checked more frequently. Ongoing high lipid and cholesterol levels should be treated with medicines if diet and exercise are not working.  . If you smoke, find out from your health care provider how to quit. If you do not use tobacco, please do not start.  . If you choose to drink alcohol, please do  not consume more than 2 drinks per day. One drink is considered to be 12 ounces (355 mL) of beer, 5 ounces (148 mL) of wine, or 1.5 ounces (44 mL) of liquor.  . If you are 29-15 years old, ask your health care provider if you should take aspirin to prevent strokes.  . Use sunscreen. Apply sunscreen liberally and repeatedly throughout the day. You should seek shade when your shadow is shorter than you. Protect yourself by wearing long sleeves, pants, a wide-brimmed hat, and sunglasses year round, whenever you are outdoors.  . Once a month, do a whole body skin exam, using a mirror to look at the skin on your back. Tell your health care provider of new moles, moles that have irregular borders, moles that are larger than a pencil eraser, or moles that have changed in shape or color.

## 2016-09-11 NOTE — Progress Notes (Signed)
Subjective:   Keith Jordan is a 75 y.o. male who presents for an Initial Medicare Annual Wellness Visit.  Review of Systems  N/A Cardiac Risk Factors include: advanced age (>31men, >53 women);male gender;dyslipidemia    Objective:    Today's Vitals   09/11/16 1452  BP: 130/78  Pulse: 79  Temp: 98.8 F (37.1 C)  TempSrc: Oral  SpO2: 95%  Weight: 162 lb 12 oz (73.8 kg)  Height: 5\' 8"  (1.727 m)  PainSc: 0-No pain   Body mass index is 24.75 kg/m.  Current Medications (verified) Outpatient Encounter Prescriptions as of 09/11/2016  Medication Sig  . atorvastatin (LIPITOR) 10 MG tablet Take 1 tablet (10 mg total) by mouth daily.   No facility-administered encounter medications on file as of 09/11/2016.     Allergies (verified) Review of patient's allergies indicates no known allergies.   History: Past Medical History:  Diagnosis Date  . Hyperlipidemia   . Ischemic optic neuropathy 2006   [place] at [facility]  . Personal history of colonic adenoma 07/30/2008  . Prediabetes 07/05/2016   A1c 6.5% 03/2014   . Primary osteoarthritis of knee    bilateral s/p L knee replacement, receives R knee injections Francis Gaines)   Past Surgical History:  Procedure Laterality Date  . COLONOSCOPY  12/2013   no polyps, no rpt due [name])  . TOTAL KNEE ARTHROPLASTY Left 2005   [name]   Family History  Problem Relation Age of Onset  . Blindness Mother 7    ?stroke in eyes  . Lung disease Father     Ecologist  . Arthritis Father   . Colon cancer Neg Hx   . Pancreatic cancer Neg Hx   . Stomach cancer Neg Hx   . Esophageal cancer Neg Hx   . Rectal cancer Neg Hx   . Cancer Neg Hx   . Diabetes Neg Hx   . CAD Neg Hx   . Stroke Neg Hx    Social History   Occupational History  . Not on file.   Social History Main Topics  . Smoking status: Never Smoker  . Smokeless tobacco: Never Used  . Alcohol use Yes     Comment: occasional  . Drug use: No  . Sexual activity:  Not on file   Tobacco Counseling Counseling given: No   Activities of Daily Living In your present state of health, do you have any difficulty performing the following activities: 09/11/2016  Hearing? N  Vision? N  Difficulty concentrating or making decisions? N  Walking or climbing stairs? N  Dressing or bathing? N  Doing errands, shopping? N  Preparing Food and eating ? N  Using the Toilet? N  In the past six months, have you accidently leaked urine? N  Do you have problems with loss of bowel control? N  Managing your Medications? N  Managing your Finances? N  Housekeeping or managing your Housekeeping? N  Some recent data might be hidden    Immunizations and Health Maintenance Immunization History  Administered Date(s) Administered  . Influenza Split 10/30/2013   There are no preventive care reminders to display for this patient.  Patient Care Team: [name], MD as PCP - General (Family Medicine) [facility] as Consulting Physician (Orthopedic Surgery)    Assessment:   This is a routine wellness examination for Keith Jordan.   Hearing/Vision screen  Hearing Screening   125Hz  250Hz  500Hz  1000Hz  2000Hz  3000Hz  4000Hz  6000Hz  8000Hz   Right ear:  40 0 0  0    Left ear:   40 0 0  0    Vision Screening Comments: Last vision exam in March 2017 @ [facility]  Dietary issues and exercise activities discussed: Current Exercise Habits: Home exercise routine, Type of exercise: strength training/weights;stretching, Time (Minutes): 30, Frequency (Times/Week): 3, Weekly Exercise (Minutes/Week): 90, Intensity: Moderate, Exercise limited by: None identified  Goals    . Increase physical activity          Starting 09/11/2016, I will continue to exercise for at least 30 min 3 days per week.       Depression Screen PHQ 2/9 Scores 09/11/2016  PHQ - 2 Score 0    Fall Risk Fall Risk  09/11/2016  Falls in the past year? No    Cognitive  Function: MMSE - Mini Mental State Exam 09/11/2016  Orientation to time 5  Orientation to Place 5  Registration 3  Attention/ Calculation 0  Recall 3  Language- name 2 objects 0  Language- repeat 1  Language- follow 3 step command 3  Language- read & follow direction 0  Write a sentence 0  Copy design 0  Total score 20   PLEASE NOTE: A Mini-Cog screen was completed. Maximum score is 20. A value of 0 denotes this part of Folstein MMSE was not completed or the patient failed this part of the Mini-Cog screening.   Mini-Cog Screening Orientation to Time - Max 5 pts Orientation to Place - Max 5 pts Registration - Max 3 pts Recall - Max 3 pts Language Repeat - Max 1 pts Language Follow 3 Step Command - Max 3 pts  Screening Tests Health Maintenance  Topic Date Due  . DTaP/Tdap/Td (1 - Tdap) 09/11/2017 (Originally 03/17/1961)  . FOOT EXAM  09/11/2017 (Originally [birth year]17/1951)  . URINE MICROALBUMIN  09/11/2017 (Originally 03/17/1951)  . ZOSTAVAX  09/11/2017 (Originally 03/17/2002)  . TETANUS/TDAP  09/11/2017 (Originally 03/17/1961)  . HEMOGLOBIN A1C  02/24/2017  . OPHTHALMOLOGY EXAM  03/19/2017  . PNA vac Low Risk Adult (2 of 2 - PPSV23) 09/08/2017  . COLONOSCOPY  01/06/2024  . INFLUENZA VACCINE  Completed        Plan:      I have personally reviewed and addressed the Medicare Annual Wellness questionnaire and have noted the following in the patient's chart:  A. Medical and social history B. Use of alcohol, tobacco or illicit drugs  C. Current medications and supplements D. Functional ability and status E.  Nutritional status F.  Physical activity G. Advance directives H. List of other physicians I.  Hospitalizations, surgeries, and ER visits in previous 12 months J.  [place] to include hearing, vision, cognitive, depression L. Referrals and appointments - none  In addition, I have reviewed and discussed with patient certain preventive protocols, quality  metrics, and best practice recommendations. A written personalized care plan for preventive services as well as general preventive health recommendations were provided to patient.  See attached scanned questionnaire for additional information.   Signed,   Lindell Noe, MHA, BS, LPN Health Advisor

## 2016-09-11 NOTE — Progress Notes (Signed)
Pre visit review using our clinic review tool, if applicable. No additional management support is needed unless otherwise documented below in the visit note. 

## 2016-09-11 NOTE — Progress Notes (Signed)
PCP notes:   Health maintenance:  Tetanus - will discuss with insurance Shingles - will discuss with insurance PCV13 - obtained through pharmacy Flu - received at CPE  Abnormal screenings:   Hearing - failed  Patient concerns:   None  Nurse concerns:  None  Next PCP appt:   None at this time

## 2016-09-12 NOTE — Progress Notes (Signed)
I reviewed health advisor's note, was available for consultation, and agree with documentation and plan.  

## 2017-03-10 DIAGNOSIS — R69 Illness, unspecified: Secondary | ICD-10-CM | POA: Diagnosis not present

## 2017-05-21 ENCOUNTER — Telehealth: Payer: Self-pay

## 2017-05-21 NOTE — Telephone Encounter (Signed)
Left pt message asking to call Ebony Hail back directly at [phone] to schedule AWV.+ labs with Katha Cabal and CPE with PCP.  Due after 09/11/17

## 2017-05-21 NOTE — Telephone Encounter (Signed)
Patient is on the list for [employer] 2018 and may be a good candidate for an AWV. Due around September.  Please let me know if/when appt is scheduled.

## 2017-05-25 NOTE — Telephone Encounter (Signed)
Scheduled 09/14/17 °

## 2017-06-14 ENCOUNTER — Ambulatory Visit (INDEPENDENT_AMBULATORY_CARE_PROVIDER_SITE_OTHER): Payer: Medicare HMO | Admitting: Family Medicine

## 2017-06-14 ENCOUNTER — Encounter: Payer: Self-pay | Admitting: Family Medicine

## 2017-06-14 VITALS — BP 120/64 | HR 78 | Temp 98.8°F | Ht 68.25 in | Wt 163.8 lb

## 2017-06-14 DIAGNOSIS — W57XXXA Bitten or stung by nonvenomous insect and other nonvenomous arthropods, initial encounter: Secondary | ICD-10-CM

## 2017-06-14 DIAGNOSIS — R42 Dizziness and giddiness: Secondary | ICD-10-CM

## 2017-06-14 DIAGNOSIS — L570 Actinic keratosis: Secondary | ICD-10-CM

## 2017-06-14 DIAGNOSIS — S80861A Insect bite (nonvenomous), right lower leg, initial encounter: Secondary | ICD-10-CM

## 2017-06-14 MED ORDER — MECLIZINE HCL 25 MG PO TABS: 25.0000 mg | ORAL_TABLET | Freq: Four times a day (QID) | ORAL | 1 refills | Status: DC | PRN

## 2017-06-14 NOTE — Progress Notes (Signed)
Dr. Frederico Hamman T. Jarrod Bodkins, MD, [facility] [place], [address] Phone: [phone] Fax: [phone]  06/14/2017  Patient: Keith Jordan, MRN: [id number], DOB: March 28, 1941, 76 y.o.  Primary Physician:  [name], MD   Chief Complaint  Patient presents with  . Insect Bite    Tick -Behind Right knee   Subjective:   Keith Jordan is a 76 y.o. very pleasant male patient who presents with the following:  R knee - tick. He pulled it out himself and has not had any systemic fevers or myalgias or arthralgia.   He did get some vertigo on Saturday. Just relaxed in his easy chair.   Tick bite on sat. DOI: 06/13/2015.   He also asks me about several skin lesions  R arm, hand 2 lesions. AK R leg. AK.   Past Medical History, Surgical History, Social History, Family History, Problem List, Medications, and Allergies have been reviewed and updated if relevant.  Patient Active Problem List   Diagnosis Date Noted  . Health maintenance examination 09/01/2016  . Advanced care planning/counseling discussion 09/01/2016  . Prediabetes 07/05/2016  . Hyperlipidemia   . Primary osteoarthritis of knee   . Ischemic optic neuropathy   . Personal history of colonic adenoma 07/30/2008    Past Medical History:  Diagnosis Date  . Hyperlipidemia   . Ischemic optic neuropathy 2006   [place] at [facility]  . Personal history of colonic adenoma 07/30/2008  . Prediabetes 07/05/2016   A1c 6.5% 03/2014   . Primary osteoarthritis of knee    bilateral s/p L knee replacement, receives R knee injections Francis Gaines)    Past Surgical History:  Procedure Laterality Date  . COLONOSCOPY  12/2013   no polyps, no rpt due [name])  . TOTAL KNEE ARTHROPLASTY Left 2005   [name]    Social History   Social History  . Marital status: Married    Spouse name: N/A  . Number of children: N/A  . Years of education: N/A   Occupational  History  . Not on file.   Social History Main Topics  . Smoking status: Never Smoker  . Smokeless tobacco: Never Used  . Alcohol use Yes     Comment: occasional  . Drug use: No  . Sexual activity: Not on file   Other Topics Concern  . Not on file   Social History Narrative   Lives with wife, 2 cats   Occ: retired Personal assistant   Activity: works in yard   Diet: some water, fruits/vegetables daily    Family History  Problem Relation Age of Onset  . Blindness Mother 6       ?stroke in eyes  . Lung disease Father        Ecologist  . Arthritis Father   . Colon cancer Neg Hx   . Pancreatic cancer Neg Hx   . Stomach cancer Neg Hx   . Esophageal cancer Neg Hx   . Rectal cancer Neg Hx   . Cancer Neg Hx   . Diabetes Neg Hx   . CAD Neg Hx   . Stroke Neg Hx     No Known Allergies  Medication list reviewed and updated in full in [place].   GEN: No acute illnesses, no fevers, chills. GI: No n/v/d, eating normally Pulm: No SOB Interactive and getting along well at home.  Otherwise, ROS is as per the HPI.  Objective:  BP 120/64   Pulse 78   Temp 98.8 F (37.1 C) (Oral)   Ht 5' 8.25" (1.734 m)   Wt 163 lb 12 oz (74.3 kg)   BMI 24.72 kg/m   GEN: WDWN, NAD, Non-toxic, A & O x 3 HEENT: Atraumatic, Normocephalic. Neck supple. No masses, No LAD. Ears and Nose: No external deformity. CV: RRR, No M/G/R. No JVD. No thrill. No extra heart sounds. PULM: CTA B, no wheezes, crackles, rhonchi. No retractions. No resp. distress. No accessory muscle use. EXTR: No c/c/e NEURO Normal gait.  PSYCH: Normally interactive. Conversant. Not depressed or anxious appearing.  Calm demeanor.   Skin: R distal forearm, there are 2 areas crusty in character. R lateral distal LE, crusty lesion.  Laboratory and Imaging Data:  Assessment and Plan:   Tick bite of lower leg, right, initial encounter  Actinic keratoses  Vertigo  Not concerning for systemic tick borne illness. Call  if symptomatic. ? If vertigo is related. Vs BPPV.  Skin lesions concerning for AK - recommend removal / cryo or some type of definitive care. If they return, then surgical removal.   Cryotherapy  Reason: Probable AK's based on appearance Location: distal R forearm x 2, R lower leg  Liquid nitrogen was applied using the liquid nitrogen gun without difficulty with an otoscope tip for concentration. Tolerated well without complications.   Follow-up: prn  Future Appointments Date Time Provider [place]  09/14/2017 9:00 AM Eustace Pen, LPN [facility]  09/24/2017 10:30 AM [name], MD [facility]    Meds ordered this encounter  Medications  . meclizine (ANTIVERT) 25 MG tablet    Sig: Take 1 tablet (25 mg total) by mouth 4 (four) times daily as needed for dizziness.    Dispense:  40 tablet    Refill:  1   Signed,  Deaven Barron T. Zakiyyah Savannah, MD   Allergies as of 06/14/2017   No Known Allergies     Medication List       Accurate as of 06/14/17 10:36 AM. Always use your most recent med list.          atorvastatin 10 MG tablet Commonly known as:  LIPITOR Take 1 tablet (10 mg total) by mouth daily.   meclizine 25 MG tablet Commonly known as:  ANTIVERT Take 1 tablet (25 mg total) by mouth 4 (four) times daily as needed for dizziness.

## 2017-06-27 LAB — HM DIABETES EYE EXAM

## 2017-07-01 DIAGNOSIS — M25561 Pain in right knee: Secondary | ICD-10-CM | POA: Diagnosis not present

## 2017-07-01 DIAGNOSIS — Z96652 Presence of left artificial knee joint: Secondary | ICD-10-CM | POA: Diagnosis not present

## 2017-07-01 DIAGNOSIS — M1711 Unilateral primary osteoarthritis, right knee: Secondary | ICD-10-CM | POA: Diagnosis not present

## 2017-07-02 ENCOUNTER — Other Ambulatory Visit: Payer: Self-pay | Admitting: Family Medicine

## 2017-07-08 DIAGNOSIS — M1711 Unilateral primary osteoarthritis, right knee: Secondary | ICD-10-CM | POA: Diagnosis not present

## 2017-07-08 DIAGNOSIS — R262 Difficulty in walking, not elsewhere classified: Secondary | ICD-10-CM | POA: Diagnosis not present

## 2017-07-08 DIAGNOSIS — M25561 Pain in right knee: Secondary | ICD-10-CM | POA: Diagnosis not present

## 2017-07-15 DIAGNOSIS — M25561 Pain in right knee: Secondary | ICD-10-CM | POA: Diagnosis not present

## 2017-07-15 DIAGNOSIS — M1711 Unilateral primary osteoarthritis, right knee: Secondary | ICD-10-CM | POA: Diagnosis not present

## 2017-07-22 DIAGNOSIS — M25561 Pain in right knee: Secondary | ICD-10-CM | POA: Diagnosis not present

## 2017-07-22 DIAGNOSIS — M1711 Unilateral primary osteoarthritis, right knee: Secondary | ICD-10-CM | POA: Diagnosis not present

## 2017-07-27 DIAGNOSIS — H2513 Age-related nuclear cataract, bilateral: Secondary | ICD-10-CM | POA: Diagnosis not present

## 2017-07-27 DIAGNOSIS — H02051 Trichiasis without entropian right upper eyelid: Secondary | ICD-10-CM | POA: Diagnosis not present

## 2017-07-29 DIAGNOSIS — M25561 Pain in right knee: Secondary | ICD-10-CM | POA: Diagnosis not present

## 2017-07-29 DIAGNOSIS — M1711 Unilateral primary osteoarthritis, right knee: Secondary | ICD-10-CM | POA: Diagnosis not present

## 2017-08-05 DIAGNOSIS — M1711 Unilateral primary osteoarthritis, right knee: Secondary | ICD-10-CM | POA: Diagnosis not present

## 2017-08-05 DIAGNOSIS — M25561 Pain in right knee: Secondary | ICD-10-CM | POA: Diagnosis not present

## 2017-09-14 ENCOUNTER — Other Ambulatory Visit: Payer: Self-pay | Admitting: Family Medicine

## 2017-09-14 ENCOUNTER — Ambulatory Visit (INDEPENDENT_AMBULATORY_CARE_PROVIDER_SITE_OTHER): Payer: Medicare HMO

## 2017-09-14 VITALS — BP 114/70 | HR 75 | Temp 98.6°F | Ht 68.5 in | Wt 164.2 lb

## 2017-09-14 DIAGNOSIS — Z125 Encounter for screening for malignant neoplasm of prostate: Secondary | ICD-10-CM

## 2017-09-14 DIAGNOSIS — R7303 Prediabetes: Secondary | ICD-10-CM | POA: Diagnosis not present

## 2017-09-14 DIAGNOSIS — E785 Hyperlipidemia, unspecified: Secondary | ICD-10-CM | POA: Diagnosis not present

## 2017-09-14 DIAGNOSIS — Z23 Encounter for immunization: Secondary | ICD-10-CM | POA: Diagnosis not present

## 2017-09-14 DIAGNOSIS — Z0000 Encounter for general adult medical examination without abnormal findings: Secondary | ICD-10-CM

## 2017-09-14 LAB — LIPID PANEL
CHOL/HDL RATIO: 4
Cholesterol: 166 mg/dL (ref 0–200)
HDL: 37.5 mg/dL — AB (ref >39.00)
LDL CALC: 94 mg/dL (ref 0–99)
NonHDL: 128.17
TRIGLYCERIDES: 171 mg/dL — AB (ref 0.0–149.0)
VLDL: 34.2 mg/dL (ref 0.0–40.0)

## 2017-09-14 LAB — COMPREHENSIVE METABOLIC PANEL
ALT: 11 U/L (ref 0–53)
AST: 19 U/L (ref 0–37)
Albumin: 4.1 g/dL (ref 3.5–5.2)
Alkaline Phosphatase: 66 U/L (ref 39–117)
BUN: 25 mg/dL — ABNORMAL HIGH (ref 6–23)
CALCIUM: 9.5 mg/dL (ref 8.4–10.5)
CHLORIDE: 102 meq/L (ref 96–112)
CO2: 32 meq/L (ref 19–32)
Creatinine, Ser: 1.08 mg/dL (ref 0.40–1.50)
GFR: 70.75 mL/min (ref >60.00)
Glucose, Bld: 133 mg/dL — ABNORMAL HIGH (ref 70–99)
Potassium: 4.1 mEq/L (ref 3.5–5.1)
Sodium: 141 mEq/L (ref 135–145)
Total Bilirubin: 0.8 mg/dL (ref 0.2–1.2)
Total Protein: 7 g/dL (ref 6.0–8.3)

## 2017-09-14 LAB — PSA, MEDICARE: PSA: 3.7 ng/ml (ref 0.10–4.00)

## 2017-09-14 LAB — HEMOGLOBIN A1C: Hgb A1c MFr Bld: 6.5 % (ref 4.6–6.5)

## 2017-09-14 NOTE — Patient Instructions (Signed)
Keith Jordan , Thank you for taking time to come for your Medicare Wellness Visit. I appreciate your ongoing commitment to your health goals. Please review the following plan we discussed and let me know if I can assist you in the future.   These are the goals we discussed: Goals    . Increase physical activity          Starting 09/14/2017, I will continue to exercise for at least 30 min 3 days per week.        This is a list of the screening recommended for you and due dates:  Health Maintenance  Topic Date Due  . DTaP/Tdap/Td vaccine (1 - Tdap) 09/15/2027*  . Tetanus Vaccine  09/15/2027*  . Colon Cancer Screening  01/06/2024  . Flu Shot  Completed  . Pneumonia vaccines  Completed  *Topic was postponed. The date shown is not the original due date.   Preventive Care for Adults  A healthy lifestyle and preventive care can promote health and wellness. Preventive health guidelines for adults include the following key practices.  . A routine yearly physical is a good way to check with your health care provider about your health and preventive screening. It is a chance to share any concerns and updates on your health and to receive a thorough exam.  . Visit your dentist for a routine exam and preventive care every 6 months. Brush your teeth twice a day and floss once a day. Good oral hygiene prevents tooth decay and gum disease.  . The frequency of eye exams is based on your age, health, family medical history, use  of contact lenses, and other factors. Follow your health care provider's ecommendations for frequency of eye exams.  . Eat a healthy diet. Foods like vegetables, fruits, whole grains, low-fat dairy products, and lean protein foods contain the nutrients you need without too many calories. Decrease your intake of foods high in solid fats, added sugars, and salt. Eat the right amount of calories for you. Get information about a proper diet from your health care provider, if  necessary.  . Regular physical exercise is one of the most important things you can do for your health. Most adults should get at least 150 minutes of moderate-intensity exercise (any activity that increases your heart rate and causes you to sweat) each week. In addition, most adults need muscle-strengthening exercises on 2 or more days a week.  Silver Sneakers may be a benefit available to you. To determine eligibility, you may visit the website: www.silversneakers.com or contact program at 660-597-3576 Mon-Fri between 8AM-8PM.   . Maintain a healthy weight. The body mass index (BMI) is a screening tool to identify possible weight problems. It provides an estimate of body fat based on height and weight. Your health care provider can find your BMI and can help you achieve or maintain a healthy weight.   For adults 20 years and older: ? A BMI below 18.5 is considered underweight. ? A BMI of 18.5 to 24.9 is normal. ? A BMI of 25 to 29.9 is considered overweight. ? A BMI of 30 and above is considered obese.   . Maintain normal blood lipids and cholesterol levels by exercising and minimizing your intake of saturated fat. Eat a balanced diet with plenty of fruit and vegetables. Blood tests for lipids and cholesterol should begin at age 35 and be repeated every 5 years. If your lipid or cholesterol levels are high, you are over 50, or you  are at high risk for heart disease, you may need your cholesterol levels checked more frequently. Ongoing high lipid and cholesterol levels should be treated with medicines if diet and exercise are not working.  . If you smoke, find out from your health care provider how to quit. If you do not use tobacco, please do not start.  . If you choose to drink alcohol, please do not consume more than 2 drinks per day. One drink is considered to be 12 ounces (355 mL) of beer, 5 ounces (148 mL) of wine, or 1.5 ounces (44 mL) of liquor.  . If you are 29-16 years old, ask your  health care provider if you should take aspirin to prevent strokes.  . Use sunscreen. Apply sunscreen liberally and repeatedly throughout the day. You should seek shade when your shadow is shorter than you. Protect yourself by wearing long sleeves, pants, a wide-brimmed hat, and sunglasses year round, whenever you are outdoors.  . Once a month, do a whole body skin exam, using a mirror to look at the skin on your back. Tell your health care provider of new moles, moles that have irregular borders, moles that are larger than a pencil eraser, or moles that have changed in shape or color.

## 2017-09-14 NOTE — Progress Notes (Signed)
Subjective:   Keith Jordan is a 76 y.o. male who presents for Medicare Annual/Subsequent preventive examination.  Review of Systems:  N/A Cardiac Risk Factors include: advanced age (>29men, >2 women);dyslipidemia;male gender     Objective:    Vitals: BP 114/70 (BP Location: Right Arm, Patient Position: Sitting, Cuff Size: Normal)   Pulse 75   Temp 98.6 F (37 C) (Oral)   Ht 5' 8.5" (1.74 m) Comment: no shoes  Wt 164 lb 4 oz (74.5 kg)   SpO2 94%   BMI 24.61 kg/m   Body mass index is 24.61 kg/m.  Tobacco History  Smoking Status  . Never Smoker  Smokeless Tobacco  . Never Used     Counseling given: No   Past Medical History:  Diagnosis Date  . Hyperlipidemia   . Ischemic optic neuropathy 2006   [place] at [facility]  . Personal history of colonic adenoma 07/30/2008  . Prediabetes 07/05/2016   A1c 6.5% 03/2014   . Primary osteoarthritis of knee    bilateral s/p L knee replacement, receives R knee injections Francis Gaines)   Past Surgical History:  Procedure Laterality Date  . COLONOSCOPY  12/2013   no polyps, no rpt due [name])  . TOTAL KNEE ARTHROPLASTY Left 2005   [name]   Family History  Problem Relation Age of Onset  . Blindness Mother 31       ?stroke in eyes  . Lung disease Father        Ecologist  . Arthritis Father   . Colon cancer Neg Hx   . Pancreatic cancer Neg Hx   . Stomach cancer Neg Hx   . Esophageal cancer Neg Hx   . Rectal cancer Neg Hx   . Cancer Neg Hx   . Diabetes Neg Hx   . CAD Neg Hx   . Stroke Neg Hx    History  Sexual Activity  . Sexual activity: Not on file    Outpatient Encounter Prescriptions as of 09/14/2017  Medication Sig  . atorvastatin (LIPITOR) 10 MG tablet TAKE 1 TABLET BY MOUTH DAILY  . meclizine (ANTIVERT) 25 MG tablet Take 1 tablet (25 mg total) by mouth 4 (four) times daily as needed for dizziness.   No facility-administered encounter medications on file as of 09/14/2017.     Activities of Daily  Living In your present state of health, do you have any difficulty performing the following activities: 09/14/2017  Hearing? N  Vision? N  Difficulty concentrating or making decisions? N  Walking or climbing stairs? Y  Comment knee pain when walking long distances  Dressing or bathing? N  Doing errands, shopping? N  Preparing Food and eating ? N  Using the Toilet? N  In the past six months, have you accidently leaked urine? N  Do you have problems with loss of bowel control? N  Managing your Medications? N  Managing your Finances? N  Housekeeping or managing your Housekeeping? N  Some recent data might be hidden    Patient Care Team: [name], MD as PCP - General (Family Medicine) Special[facility] as Consulting Physician (Orthopedic Surgery)   Assessment:     Hearing Screening   125Hz  250Hz  500Hz  1000Hz  2000Hz  3000Hz  4000Hz  6000Hz  8000Hz   Right ear:   40 0 0  0    Left ear:   40 0 0  0    Vision Screening Comments: Last vision exam in August 2018 @ [facility]  Exercise Activities and Dietary recommendations Current Exercise Habits: Home exercise routine, Type of exercise: strength training/weights;stretching, Time (Minutes): 30, Frequency (Times/Week): 3, Weekly Exercise (Minutes/Week): 90, Intensity: Moderate, Exercise limited by: None identified  Goals    . Increase physical activity          Starting 09/14/2017, I will continue to exercise for at least 30 min 3 days per week.       Fall Risk Fall Risk  09/14/2017 09/11/2016  Falls in the past year? No No   Depression Screen PHQ 2/9 Scores 09/14/2017 09/11/2016  PHQ - 2 Score 0 0  PHQ- 9 Score 1 -    Cognitive Function MMSE - Mini Mental State Exam 09/14/2017 09/11/2016  Orientation to time 5 5  Orientation to Place 5 5  Registration 3 3  Attention/ Calculation 0 0  Recall 3 3  Language- name 2 objects 0 0  Language- repeat 1 1  Language- follow 3 step command 3 3    Language- read & follow direction 0 0  Write a sentence 0 0  Copy design 0 0  Total score 20 20       PLEASE NOTE: A Mini-Cog screen was completed. Maximum score is 20. A value of 0 denotes this part of Folstein MMSE was not completed or the patient failed this part of the Mini-Cog screening.   Mini-Cog Screening Orientation to Time - Max 5 pts Orientation to Place - Max 5 pts Registration - Max 3 pts Recall - Max 3 pts Language Repeat - Max 1 pts Language Follow 3 Step Command - Max 3 pts   Immunization History  Administered Date(s) Administered  . Influenza Split 10/30/2013  . Influenza,inj,Quad PF,6+ Mos 09/14/2017  . Influenza-Unspecified 09/01/2016  . Pneumococcal Polysaccharide-23 09/14/2017   Screening Tests Health Maintenance  Topic Date Due  . DTaP/Tdap/Td (1 - Tdap) 09/15/2027 (Originally 03/17/1961)  . TETANUS/TDAP  09/15/2027 (Originally 03/17/1961)  . COLONOSCOPY  01/06/2024  . INFLUENZA VACCINE  Completed  . PNA vac Low Risk Adult  Completed      Plan:     I have personally reviewed and addressed the Medicare Annual Wellness questionnaire and have noted the following in the patient's chart:  A. Medical and social history B. Use of alcohol, tobacco or illicit drugs  C. Current medications and supplements D. Functional ability and status E.  Nutritional status F.  Physical activity G. Advance directives H. List of other physicians I.  Hospitalizations, surgeries, and ER visits in previous 12 months J.  [place] to include hearing, vision, cognitive, depression L. Referrals and appointments - none  In addition, I have reviewed and discussed with patient certain preventive protocols, quality metrics, and best practice recommendations. A written personalized care plan for preventive services as well as general preventive health recommendations were provided to patient.  See attached scanned questionnaire for additional information.   Signed,    Lindell Noe, MHA, BS, LPN Health Coach

## 2017-09-14 NOTE — Progress Notes (Signed)
PCP notes:   Health maintenance:  Flu vaccine - administered PPSV23 - administered Tetanus - postponed/insurance  Abnormal screenings:   Hearing - failed  Hearing Screening   125Hz  250Hz  500Hz  1000Hz  2000Hz  3000Hz  4000Hz  6000Hz  8000Hz   Right ear:   40 0 0  0    Left ear:   40 0 0  0     Depression score: 1  Patient concerns:   None  Nurse concerns:  None  Next PCP appt:   09/24/17 @ 1030

## 2017-09-14 NOTE — Progress Notes (Signed)
I reviewed health advisor's note, was available for consultation, and agree with documentation and plan.  

## 2017-09-14 NOTE — Progress Notes (Signed)
Pre visit review using our clinic review tool, if applicable. No additional management support is needed unless otherwise documented below in the visit note. 

## 2017-09-24 ENCOUNTER — Ambulatory Visit (INDEPENDENT_AMBULATORY_CARE_PROVIDER_SITE_OTHER): Payer: Medicare HMO | Admitting: Family Medicine

## 2017-09-24 ENCOUNTER — Encounter: Payer: Self-pay | Admitting: Family Medicine

## 2017-09-24 VITALS — BP 118/60 | HR 64 | Temp 98.3°F | Ht 69.0 in | Wt 164.8 lb

## 2017-09-24 DIAGNOSIS — Z0000 Encounter for general adult medical examination without abnormal findings: Secondary | ICD-10-CM | POA: Diagnosis not present

## 2017-09-24 DIAGNOSIS — Z7189 Other specified counseling: Secondary | ICD-10-CM

## 2017-09-24 DIAGNOSIS — M1710 Unilateral primary osteoarthritis, unspecified knee: Secondary | ICD-10-CM

## 2017-09-24 DIAGNOSIS — E785 Hyperlipidemia, unspecified: Secondary | ICD-10-CM | POA: Diagnosis not present

## 2017-09-24 DIAGNOSIS — E118 Type 2 diabetes mellitus with unspecified complications: Secondary | ICD-10-CM

## 2017-09-24 NOTE — Assessment & Plan Note (Signed)
Chronic, stable. Reviewed sugar levels. rec return 6 mo sooner f/u given A1c in diabetes range.

## 2017-09-24 NOTE — Assessment & Plan Note (Signed)
Chronic, stable. Continue current regimen. The 10-year ASCVD risk score Mikey Bussing DC Brooke Bonito., et al., 2013) is: 22.5%   Values used to calculate the score:     Age: 76 years     Sex: Male     Is Non-Hispanic African American: No     Diabetic: No     Tobacco smoker: No     Systolic Blood Pressure: 056 mmHg     Is BP treated: No     HDL Cholesterol: 37.5 mg/dL     Total Cholesterol: 166 mg/dL

## 2017-09-24 NOTE — Assessment & Plan Note (Signed)
Advanced directive discussion - has at home. Son has copy. Son is Keith Jordan is HCPOA. Asked to bring copy.

## 2017-09-24 NOTE — Assessment & Plan Note (Signed)
S/p L knee replacement 12-14 yrs ago Keith Jordan) noticing return of discomfort. Will return to ortho if needed.

## 2017-09-24 NOTE — Assessment & Plan Note (Signed)
Preventative protocols reviewed and updated unless pt declined. Discussed healthy diet and lifestyle.  

## 2017-09-24 NOTE — Patient Instructions (Addendum)
You are doing well today. Keep an eye on sugar in diet to help control diabetes.  If interested, check with pharmacy about new 2 shot shingles series (shingrix).  Bring [place] copy of your advanced directive.  Bring in copy of [facility].  Good to see you today.  Return in 6 months for diabetes check.   Health Maintenance, Male A healthy lifestyle and preventive care is important for your health and wellness. Ask your health care provider about what schedule of regular examinations is right for you. What should I know about weight and diet? Eat a Healthy Diet  Eat plenty of vegetables, fruits, whole grains, low-fat dairy products, and lean protein.  Do not eat a lot of foods high in solid fats, added sugars, or salt.  Maintain a Healthy Weight Regular exercise can help you achieve or maintain a healthy weight. You should:  Do at least 150 minutes of exercise each week. The exercise should increase your heart rate and make you sweat (moderate-intensity exercise).  Do strength-training exercises at least twice a week.  Watch Your Levels of Cholesterol and Blood Lipids  Have your blood tested for lipids and cholesterol every 5 years starting at 75 years of age. If you are at high risk for heart disease, you should start having your blood tested when you are 75 years old. You may need to have your cholesterol levels checked more often if: ? Your lipid or cholesterol levels are high. ? You are older than 75 years of age. ? You are at high risk for heart disease.  What should I know about cancer screening? Many types of cancers can be detected early and may often be prevented. Lung Cancer  You should be screened every year for lung cancer if: ? You are a current smoker who has smoked for at least 30 years. ? You are a former smoker who has quit within the past 15 years.  Talk to your health care provider about your screening options, when you should start screening, and how often you  should be screened.  Colorectal Cancer  Routine colorectal cancer screening usually begins at 75 years of age and should be repeated every 5-10 years until you are 75 years old. You may need to be screened more often if early forms of precancerous polyps or small growths are found. Your health care provider may recommend screening at an earlier age if you have risk factors for colon cancer.  Your health care provider may recommend using home test kits to check for hidden blood in the stool.  A small camera at the end of a tube can be used to examine your colon (sigmoidoscopy or colonoscopy). This checks for the earliest forms of colorectal cancer.  Prostate and Testicular Cancer  Depending on your age and overall health, your health care provider may do certain tests to screen for prostate and testicular cancer.  Talk to your health care provider about any symptoms or concerns you have about testicular or prostate cancer.  Skin Cancer  Check your skin from head to toe regularly.  Tell your health care provider about any new moles or changes in moles, especially if: ? There is a change in a mole's size, shape, or color. ? You have a mole that is larger than a pencil eraser.  Always use sunscreen. Apply sunscreen liberally and repeat throughout the day.  Protect yourself by wearing long sleeves, pants, a wide-brimmed hat, and sunglasses when outside.  What should I  know about heart disease, diabetes, and high blood pressure?  If you are 20-46 years of age, have your blood pressure checked every 3-5 years. If you are 58 years of age or older, have your blood pressure checked every year. You should have your blood pressure measured twice-once when you are at a hospital or clinic, and once when you are not at a hospital or clinic. Record the average of the two measurements. To check your blood pressure when you are not at a hospital or clinic, you can use: ? An automated blood pressure  machine at a pharmacy. ? A home blood pressure monitor.  Talk to your health care provider about your target blood pressure.  If you are between 21-56 years old, ask your health care provider if you should take aspirin to prevent heart disease.  Have regular diabetes screenings by checking your fasting blood sugar level. ? If you are at a normal weight and have a low risk for diabetes, have this test once every three years after the age of 53. ? If you are overweight and have a high risk for diabetes, consider being tested at a younger age or more often.  A one-time screening for abdominal aortic aneurysm (AAA) by ultrasound is recommended for men aged 46-75 years who are current or former smokers. What should I know about preventing infection? Hepatitis B If you have a higher risk for hepatitis B, you should be screened for this virus. Talk with your health care provider to find out if you are at risk for hepatitis B infection. Hepatitis C Blood testing is recommended for:  Everyone born from [birth year] through 1964.  Anyone with known risk factors for hepatitis C.  Sexually Transmitted Diseases (STDs)  You should be screened each year for STDs including gonorrhea and chlamydia if: ? You are sexually active and are younger than 75 years of age. ? You are older than 75 years of age and your health care provider tells you that you are at risk for this type of infection. ? Your sexual activity has changed since you were last screened and you are at an increased risk for chlamydia or gonorrhea. Ask your health care provider if you are at risk.  Talk with your health care provider about whether you are at high risk of being infected with HIV. Your health care provider may recommend a prescription medicine to help prevent HIV infection.  What else can I do?  Schedule regular health, dental, and eye exams.  Stay current with your vaccines (immunizations).  Do not use any tobacco products,  such as cigarettes, chewing tobacco, and e-cigarettes. If you need help quitting, ask your health care provider.  Limit alcohol intake to no more than 2 drinks per day. One drink equals 12 ounces of beer, 5 ounces of wine, or 1 ounces of hard liquor.  Do not use street drugs.  Do not share needles.  Ask your health care provider for help if you need support or information about quitting drugs.  Tell your health care provider if you often feel depressed.  Tell your health care provider if you have ever been abused or do not feel safe at home. This information is not intended to replace advice given to you by your health care provider. Make sure you discuss any questions you have with your health care provider. Document Released: 06/11/2008 Document Revised: 08/12/2016 Document Reviewed: 09/17/2015 Elsevier Interactive Patient Education  [employer].

## 2017-09-24 NOTE — Progress Notes (Signed)
BP 118/60 (BP Location: Left Arm, Patient Position: Sitting, Cuff Size: Normal)   Pulse 64   Temp 98.3 F (36.8 C) (Oral)   Ht 5\' 9"  (1.753 m)   Wt 164 lb 12 oz (74.7 kg)   SpO2 94%   BMI 24.33 kg/m    CC: CPE Subjective:    Patient ID: Keith Jordan, male    DOB: 30-Dec-1940, 76 y.o.   MRN: [id number]  HPI: Keith Jordan is a 76 y.o. male presenting on 09/24/2017 for Annual Exam (Pt 2)   Saw Katha Cabal last week for medicare wellness visit. Note reviewed. Failed hearing screen. Discussed audiologist eval - declines at this time.   Preventative: COLONOSCOPY Date: 12/2013 no polyps, no rpt due Keith Jordan) Prostate cancer screening - discussed. DRE reassuring last year. Nocturia x2-3 Lung cancer screening - never smoker Flu shot yearly Tetanus shot unsure [facility], [facility] 08/2017.  Shingrix - discussed Advanced directive discussion - has at home. Son has copy. Son Keith Jordan is HCPOA. Asked to bring copy.  Seat belt use discussed Sunscreen use and skin screen discussed  Non smoker  Alcohol use - 1 glass wine in evening  Lives with wife, 2 cats  Occ: retired Personal assistant  Activity: works in yard  Diet: some water, fruits/vegetables daily   Relevant past medical, surgical, family and social history reviewed and updated as indicated. Interim medical history since our last visit reviewed. Allergies and medications reviewed and updated. Outpatient Medications Prior to Visit  Medication Sig Dispense Refill  . atorvastatin (LIPITOR) 10 MG tablet TAKE 1 TABLET BY MOUTH DAILY 90 tablet 0  . meclizine (ANTIVERT) 25 MG tablet Take 1 tablet (25 mg total) by mouth 4 (four) times daily as needed for dizziness. 40 tablet 1   No facility-administered medications prior to visit.      Per HPI unless specifically indicated in ROS section below Review of Systems  Constitutional: Negative for activity change, appetite change, chills, fatigue, fever and unexpected  weight change.  HENT: Negative for hearing loss.   Eyes: Negative for visual disturbance.  Respiratory: Negative for cough, chest tightness, shortness of breath and wheezing.   Cardiovascular: Positive for leg swelling (left ankle, mild). Negative for chest pain and palpitations.  Gastrointestinal: Negative for abdominal distention, abdominal pain, blood in stool, constipation, diarrhea, nausea and vomiting.  Genitourinary: Negative for difficulty urinating and hematuria.  Musculoskeletal: Negative for arthralgias, myalgias and neck pain.  Skin: Negative for rash.  Neurological: Negative for dizziness, seizures, syncope and headaches.  Hematological: Negative for adenopathy. Does not bruise/bleed easily.  Psychiatric/Behavioral: Negative for dysphoric mood. The patient is not nervous/anxious.        Objective:    BP 118/60 (BP Location: Left Arm, Patient Position: Sitting, Cuff Size: Normal)   Pulse 64   Temp 98.3 F (36.8 C) (Oral)   Ht 5\' 9"  (1.753 m)   Wt 164 lb 12 oz (74.7 kg)   SpO2 94%   BMI 24.33 kg/m   Wt Readings from Last 3 Encounters:  09/24/17 164 lb 12 oz (74.7 kg)  09/14/17 164 lb 4 oz (74.5 kg)  06/14/17 163 lb 12 oz (74.3 kg)    Physical Exam  Constitutional: He is oriented to person, place, and time. He appears well-developed and well-nourished. No distress.  HENT:  Head: Normocephalic and atraumatic.  Right Ear: Hearing, tympanic membrane, external ear and ear canal normal.  Left Ear: Hearing, tympanic membrane, external ear and ear  canal normal.  Nose: Nose normal.  Mouth/Throat: Uvula is midline, oropharynx is clear and moist and mucous membranes are normal. No oropharyngeal exudate, posterior oropharyngeal edema or posterior oropharyngeal erythema.  Eyes: Pupils are equal, round, and reactive to light. Conjunctivae and EOM are normal. No scleral icterus.  Neck: Normal range of motion. Neck supple. No thyromegaly present.  Cardiovascular: Normal rate,  regular rhythm, normal heart sounds and intact distal pulses.   No murmur heard. Pulses:      Radial pulses are 2+ on the right side, and 2+ on the left side.  Pulmonary/Chest: Effort normal and breath sounds normal. No respiratory distress. He has no wheezes. He has no rales.  Abdominal: Soft. Bowel sounds are normal. He exhibits no distension and no mass. There is no tenderness. There is no rebound and no guarding.  Genitourinary: Rectum normal and prostate normal. Rectal exam shows no external hemorrhoid, no fissure, no mass, no tenderness and anal tone normal. Prostate is not enlarged (20gm) and not tender.  Musculoskeletal: Normal range of motion. He exhibits no edema.  Lymphadenopathy:    He has no cervical adenopathy.  Neurological: He is alert and oriented to person, place, and time.  CN grossly intact, station and gait intact  Skin: Skin is warm and dry. No rash noted.  Psychiatric: He has a normal mood and affect. His behavior is normal. Judgment and thought content normal.  Nursing note and vitals reviewed.  Results for orders placed or performed in visit on 09/14/17  PSA, Medicare  Result Value Ref Range   PSA 3.70 0.10 - 4.00 ng/ml  Lipid panel  Result Value Ref Range   Cholesterol 166 0 - 200 mg/dL   Triglycerides 171.0 (H) 0.0 - 149.0 mg/dL   HDL 37.50 (L) >39.00 mg/dL   VLDL 34.2 0.0 - 40.0 mg/dL   LDL Cholesterol 94 0 - 99 mg/dL   Total CHOL/HDL Ratio 4    NonHDL 128.17   Comprehensive metabolic panel  Result Value Ref Range   Sodium 141 135 - 145 mEq/L   Potassium 4.1 3.5 - 5.1 mEq/L   Chloride 102 96 - 112 mEq/L   CO2 32 19 - 32 mEq/L   Glucose, Bld 133 (H) 70 - 99 mg/dL   BUN 25 (H) 6 - 23 mg/dL   Creatinine, Ser 1.08 0.40 - 1.50 mg/dL   Total Bilirubin 0.8 0.2 - 1.2 mg/dL   Alkaline Phosphatase 66 39 - 117 U/L   AST 19 0 - 37 U/L   ALT 11 0 - 53 U/L   Total Protein 7.0 6.0 - 8.3 g/dL   Albumin 4.1 3.5 - 5.2 g/dL   Calcium 9.5 8.4 - 10.5 mg/dL   GFR  70.75 >60.00 mL/min  Hemoglobin A1c  Result Value Ref Range   Hgb A1c MFr Bld 6.5 4.6 - 6.5 %      Assessment & Plan:   Problem List Items Addressed This Visit    Advanced care planning/counseling discussion    Advanced directive discussion - has at home. Son has copy. Son is Keith Jordan is HCPOA. Asked to bring copy.       Controlled diabetes mellitus type 2 with complications (HCC)    Chronic, stable. Reviewed sugar levels. rec return 6 mo sooner f/u given A1c in diabetes range.       Health maintenance examination - Primary    Preventative protocols reviewed and updated unless pt declined. Discussed healthy diet and lifestyle.  Hyperlipidemia    Chronic, stable. Continue current regimen. The 10-year ASCVD risk score Mikey Bussing DC Brooke Bonito., et al., 2013) is: 22.5%   Values used to calculate the score:     Age: 89 years     Sex: Male     Is Non-Hispanic African American: No     Diabetic: No     Tobacco smoker: No     Systolic Blood Pressure: 726 mmHg     Is BP treated: No     HDL Cholesterol: 37.5 mg/dL     Total Cholesterol: 166 mg/dL       Primary osteoarthritis of knee    S/p L knee replacement 12-14 yrs ago Keith Jordan) noticing return of discomfort. Will return to ortho if needed.          Follow up plan: Return in about 6 months (around 03/24/2018) for follow up visit.  [name], MD

## 2017-09-28 DIAGNOSIS — R69 Illness, unspecified: Secondary | ICD-10-CM | POA: Diagnosis not present

## 2017-09-29 ENCOUNTER — Other Ambulatory Visit: Payer: Self-pay | Admitting: Family Medicine

## 2017-11-12 DIAGNOSIS — Z0101 Encounter for examination of eyes and vision with abnormal findings: Secondary | ICD-10-CM | POA: Diagnosis not present

## 2018-03-25 ENCOUNTER — Encounter: Payer: Self-pay | Admitting: Family Medicine

## 2018-03-25 ENCOUNTER — Ambulatory Visit (INDEPENDENT_AMBULATORY_CARE_PROVIDER_SITE_OTHER): Payer: Medicare HMO | Admitting: Family Medicine

## 2018-03-25 VITALS — BP 134/62 | HR 73 | Temp 97.9°F | Wt 163.0 lb

## 2018-03-25 DIAGNOSIS — E118 Type 2 diabetes mellitus with unspecified complications: Secondary | ICD-10-CM | POA: Diagnosis not present

## 2018-03-25 LAB — MICROALBUMIN / CREATININE URINE RATIO
CREATININE, U: 175.9 mg/dL
MICROALB UR: 0.8 mg/dL (ref 0.0–1.9)
MICROALB/CREAT RATIO: 0.4 mg/g (ref 0.0–30.0)

## 2018-03-25 LAB — POCT GLYCOSYLATED HEMOGLOBIN (HGB A1C): HEMOGLOBIN A1C: 6.4

## 2018-03-25 NOTE — Patient Instructions (Addendum)
Labs today - A1c and urine microalbumin Check with either your pharmacy or your insurance about your preferred glucose meter.  Depending on A1c results we may refer you to diabetes classes in [place].  Return in 6 months for physical.

## 2018-03-25 NOTE — Assessment & Plan Note (Addendum)
Chronic, stable. Reviewed healthy diet choices to maintain glycemic control. Does not check sugars. Will check on preferred brand for glucometer. POCT A1c today. If elevated, we will refer to diabetes education. pt agrees with plan.

## 2018-03-25 NOTE — Progress Notes (Signed)
BP 134/62 (BP Location: Left Arm, Patient Position: Sitting, Cuff Size: Normal)   Pulse 73   Temp 97.9 F (36.6 C) (Oral)   Wt 163 lb (73.9 kg)   SpO2 96%   BMI 24.07 kg/m    CC: 25mo f/u visit Subjective:    Patient ID: Keith Jordan, male    DOB: 08-16-1941, 77 y.o.   MRN: [id number]  HPI: Keith Jordan is a 77 y.o. male presenting on 03/25/2018 for 6 mo follow-up   DM - does not regularly check sugars. Compliant with antihyperglycemic regimen which includes: diet controlled diabetes. Denies low sugars or hypoglycemic symptoms. Denies paresthesias. Last diabetic eye exam sees yearly. H/o optic neuropathy. Pneumovax: 08/2017. Prevnar: due. Glucometer brand: doesn't have. DSME: he did go to prediabetes classes at [facility].  Lab Results  Component Value Date   HGBA1C 6.5 09/14/2017   Diabetic Foot Exam - Simple   Simple Foot Form Diabetic Foot exam was performed with the following findings:  Yes 03/25/2018 10:04 AM  Visual Inspection No deformities, no ulcerations, no other skin breakdown bilaterally:  Yes Sensation Testing Intact to touch and monofilament testing bilaterally:  Yes Pulse Check Posterior Tibialis and Dorsalis pulse intact bilaterally:  Yes Comments Reticular veins bilateral feet    No results found for: Derl Barrow   Relevant past medical, surgical, family and social history reviewed and updated as indicated. Interim medical history since our last visit reviewed. Allergies and medications reviewed and updated. Outpatient Medications Prior to Visit  Medication Sig Dispense Refill  . atorvastatin (LIPITOR) 10 MG tablet TAKE 1 TABLET BY MOUTH DAILY 90 tablet 3   No facility-administered medications prior to visit.      Per HPI unless specifically indicated in ROS section below Review of Systems     Objective:    BP 134/62 (BP Location: Left Arm, Patient Position: Sitting, Cuff Size: Normal)   Pulse 73   Temp 97.9 F (36.6 C) (Oral)   Wt  163 lb (73.9 kg)   SpO2 96%   BMI 24.07 kg/m   Wt Readings from Last 3 Encounters:  03/25/18 163 lb (73.9 kg)  09/24/17 164 lb 12 oz (74.7 kg)  09/14/17 164 lb 4 oz (74.5 kg)    Physical Exam  Constitutional: He appears well-developed and well-nourished. No distress.  HENT:  Head: Normocephalic and atraumatic.  Right Ear: External ear normal.  Left Ear: External ear normal.  Nose: Nose normal.  Mouth/Throat: Oropharynx is clear and moist. No oropharyngeal exudate.  Eyes: Pupils are equal, round, and reactive to light. Conjunctivae and EOM are normal. No scleral icterus.  Neck: Normal range of motion. Neck supple. No thyromegaly present.  Cardiovascular: Normal rate, regular rhythm, normal heart sounds and intact distal pulses.  No murmur heard. Pulmonary/Chest: Effort normal and breath sounds normal. No respiratory distress. He has no wheezes. He has no rales.  Musculoskeletal: He exhibits no edema.  See HPI for foot exam if done  Lymphadenopathy:    He has no cervical adenopathy.  Skin: Skin is warm and dry. No rash noted.  Psychiatric: He has a normal mood and affect.  Nursing note and vitals reviewed.  Results for orders placed or performed in visit on 03/25/18  HM DIABETES EYE EXAM  Result Value Ref Range   HM Diabetic Eye Exam No Retinopathy No Retinopathy      Assessment & Plan:   Problem List Items Addressed This Visit    Controlled diabetes mellitus type 2  with complications ([facility]) - Primary    Chronic, stable. Reviewed healthy diet choices to maintain glycemic control. Does not check sugars. Will check on preferred brand for glucometer. POCT A1c today. If elevated, we will refer to diabetes education. pt agrees with plan.       Relevant Orders   Hemoglobin A1c   Microalbumin / creatinine urine ratio       No orders of the defined types were placed in this encounter.  Orders Placed This Encounter  Procedures  . Hemoglobin A1c  . Microalbumin / creatinine  urine ratio  . [facility] DIABETES EYE EXAM    This external order was created through the Results Console.    Follow up plan: Return in about 6 months (around 09/25/2018) for annual exam, prior fasting for blood work.  [name], MD

## 2018-03-25 NOTE — Addendum Note (Signed)
Addended by: Ellamae Sia on: 03/25/2018 10:14 AM   Modules accepted: Orders

## 2018-04-07 DIAGNOSIS — R69 Illness, unspecified: Secondary | ICD-10-CM | POA: Diagnosis not present

## 2018-08-31 DIAGNOSIS — H353132 Nonexudative age-related macular degeneration, bilateral, intermediate dry stage: Secondary | ICD-10-CM | POA: Diagnosis not present

## 2018-09-16 ENCOUNTER — Other Ambulatory Visit: Payer: Self-pay | Admitting: Family Medicine

## 2018-09-25 ENCOUNTER — Other Ambulatory Visit: Payer: Self-pay | Admitting: Family Medicine

## 2018-09-25 DIAGNOSIS — E118 Type 2 diabetes mellitus with unspecified complications: Secondary | ICD-10-CM

## 2018-09-25 DIAGNOSIS — E785 Hyperlipidemia, unspecified: Secondary | ICD-10-CM

## 2018-09-25 DIAGNOSIS — Z125 Encounter for screening for malignant neoplasm of prostate: Secondary | ICD-10-CM

## 2018-09-26 ENCOUNTER — Ambulatory Visit (INDEPENDENT_AMBULATORY_CARE_PROVIDER_SITE_OTHER): Payer: Medicare HMO

## 2018-09-26 ENCOUNTER — Encounter (INDEPENDENT_AMBULATORY_CARE_PROVIDER_SITE_OTHER): Payer: Self-pay

## 2018-09-26 VITALS — BP 118/70 | HR 85 | Temp 98.5°F | Ht 68.5 in | Wt 152.5 lb

## 2018-09-26 DIAGNOSIS — Z125 Encounter for screening for malignant neoplasm of prostate: Secondary | ICD-10-CM | POA: Diagnosis not present

## 2018-09-26 DIAGNOSIS — E785 Hyperlipidemia, unspecified: Secondary | ICD-10-CM

## 2018-09-26 DIAGNOSIS — Z0000 Encounter for general adult medical examination without abnormal findings: Secondary | ICD-10-CM | POA: Diagnosis not present

## 2018-09-26 DIAGNOSIS — Z23 Encounter for immunization: Secondary | ICD-10-CM

## 2018-09-26 DIAGNOSIS — E118 Type 2 diabetes mellitus with unspecified complications: Secondary | ICD-10-CM | POA: Diagnosis not present

## 2018-09-26 LAB — COMPREHENSIVE METABOLIC PANEL
ALBUMIN: 4 g/dL (ref 3.5–5.2)
ALT: 15 U/L (ref 0–53)
AST: 25 U/L (ref 0–37)
Alkaline Phosphatase: 72 U/L (ref 39–117)
BILIRUBIN TOTAL: 0.6 mg/dL (ref 0.2–1.2)
BUN: 30 mg/dL — ABNORMAL HIGH (ref 6–23)
CALCIUM: 9.2 mg/dL (ref 8.4–10.5)
CO2: 33 meq/L — AB (ref 19–32)
CREATININE: 1.09 mg/dL (ref 0.40–1.50)
Chloride: 102 mEq/L (ref 96–112)
GFR: 69.81 mL/min (ref >60.00)
Glucose, Bld: 121 mg/dL — ABNORMAL HIGH (ref 70–99)
Potassium: 4.3 mEq/L (ref 3.5–5.1)
Sodium: 141 mEq/L (ref 135–145)
Total Protein: 6.6 g/dL (ref 6.0–8.3)

## 2018-09-26 LAB — LIPID PANEL
CHOL/HDL RATIO: 5
CHOLESTEROL: 137 mg/dL (ref 0–200)
HDL: 28.5 mg/dL — ABNORMAL LOW (ref >39.00)
LDL Cholesterol: 88 mg/dL (ref 0–99)
NonHDL: 108.34
TRIGLYCERIDES: 101 mg/dL (ref 0.0–149.0)
VLDL: 20.2 mg/dL (ref 0.0–40.0)

## 2018-09-26 LAB — MICROALBUMIN / CREATININE URINE RATIO
Creatinine,U: 221.1 mg/dL
Microalb Creat Ratio: 1.3 mg/g (ref 0.0–30.0)
Microalb, Ur: 2.9 mg/dL — ABNORMAL HIGH (ref 0.0–1.9)

## 2018-09-26 LAB — HEMOGLOBIN A1C: Hgb A1c MFr Bld: 6.2 % (ref 4.6–6.5)

## 2018-09-26 LAB — PSA: PSA: 2.94 ng/mL (ref 0.10–4.00)

## 2018-09-26 NOTE — Progress Notes (Signed)
PCP notes:   Health maintenance:  Flu vaccine - adminstered A1C - completed Microalbumin - completed  Abnormal screenings:   Hearing - failed  Hearing Screening   125Hz  250Hz  500Hz  1000Hz  2000Hz  3000Hz  4000Hz  6000Hz  8000Hz   Right ear:   40 0 40  0    Left ear:   40 0 40  0     Patient concerns:   None  Nurse concerns:  None  Next PCP appt:   09/29/2018 @ 0930

## 2018-09-26 NOTE — Patient Instructions (Signed)
Keith Jordan , Thank you for taking time to come for your Medicare Wellness Visit. I appreciate your ongoing commitment to your health goals. Please review the following plan we discussed and let me know if I can assist you in the future.   These are the goals we discussed: Goals    . Increase physical activity     Starting 09/26/2018, I will continue to exercise for at least 30 min 3 days per week.        This is a list of the screening recommended for you and due dates:  Health Maintenance  Topic Date Due  . DTaP/Tdap/Td vaccine (1 - Tdap) 09/15/2027*  . Tetanus Vaccine  09/15/2027*  . Complete foot exam   03/26/2019  . Hemoglobin A1C  03/27/2019  . Eye exam for diabetics  07/29/2019  . Urine Protein Check  09/27/2019  . Flu Shot  Completed  . Pneumonia vaccines  Completed  *Topic was postponed. The date shown is not the original due date.   Preventive Care for Adults  A healthy lifestyle and preventive care can promote health and wellness. Preventive health guidelines for adults include the following key practices.  . A routine yearly physical is a good way to check with your health care provider about your health and preventive screening. It is a chance to share any concerns and updates on your health and to receive a thorough exam.  . Visit your dentist for a routine exam and preventive care every 6 months. Brush your teeth twice a day and floss once a day. Good oral hygiene prevents tooth decay and gum disease.  . The frequency of eye exams is based on your age, health, family medical history, use  of contact lenses, and other factors. Follow your health care provider's recommendations for frequency of eye exams.  . Eat a healthy diet. Foods like vegetables, fruits, whole grains, low-fat dairy products, and lean protein foods contain the nutrients you need without too many calories. Decrease your intake of foods high in solid fats, added sugars, and salt. Eat the right amount  of calories for you. Get information about a proper diet from your health care provider, if necessary.  . Regular physical exercise is one of the most important things you can do for your health. Most adults should get at least 150 minutes of moderate-intensity exercise (any activity that increases your heart rate and causes you to sweat) each week. In addition, most adults need muscle-strengthening exercises on 2 or more days a week.  Silver Sneakers may be a benefit available to you. To determine eligibility, you may visit the website: www.silversneakers.com or contact program at 224-205-2479 Mon-Fri between 8AM-8PM.   . Maintain a healthy weight. The body mass index (BMI) is a screening tool to identify possible weight problems. It provides an estimate of body fat based on height and weight. Your health care provider can find your BMI and can help you achieve or maintain a healthy weight.   For adults 20 years and older: ? A BMI below 18.5 is considered underweight. ? A BMI of 18.5 to 24.9 is normal. ? A BMI of 25 to 29.9 is considered overweight. ? A BMI of 30 and above is considered obese.   . Maintain normal blood lipids and cholesterol levels by exercising and minimizing your intake of saturated fat. Eat a balanced diet with plenty of fruit and vegetables. Blood tests for lipids and cholesterol should begin at age 4 and be repeated  every 5 years. If your lipid or cholesterol levels are high, you are over 50, or you are at high risk for heart disease, you may need your cholesterol levels checked more frequently. Ongoing high lipid and cholesterol levels should be treated with medicines if diet and exercise are not working.  . If you smoke, find out from your health care provider how to quit. If you do not use tobacco, please do not start.  . If you choose to drink alcohol, please do not consume more than 2 drinks per day. One drink is considered to be 12 ounces (355 mL) of beer, 5 ounces  (148 mL) of wine, or 1.5 ounces (44 mL) of liquor.  . If you are 74-53 years old, ask your health care provider if you should take aspirin to prevent strokes.  . Use sunscreen. Apply sunscreen liberally and repeatedly throughout the day. You should seek shade when your shadow is shorter than you. Protect yourself by wearing long sleeves, pants, a wide-brimmed hat, and sunglasses year round, whenever you are outdoors.  . Once a month, do a whole body skin exam, using a mirror to look at the skin on your back. Tell your health care provider of new moles, moles that have irregular borders, moles that are larger than a pencil eraser, or moles that have changed in shape or color.

## 2018-09-26 NOTE — Progress Notes (Signed)
Subjective:   Keith Jordan is a 77 y.o. male who presents for Medicare Annual/Subsequent preventive examination.  Review of Systems:  N/A Cardiac Risk Factors include: advanced age (>15men, >15 women);dyslipidemia;male gender     Objective:    Vitals: BP 118/70 (BP Location: Right Arm, Patient Position: Sitting, Cuff Size: Normal)   Pulse 85   Temp 98.5 F (36.9 C) (Oral)   Ht 5' 8.5" (1.74 m) Comment: no shoes  Wt 152 lb 8 oz (69.2 kg)   SpO2 95%   BMI 22.85 kg/m   Body mass index is 22.85 kg/m.  Advanced Directives 09/26/2018 09/14/2017 09/11/2016  Does Patient Have a Medical Advance Directive? Yes Yes Yes  Type of Paramedic of [place];Living will [place];Living will [place];Living will  Does patient want to make changes to medical advance directive? - - No - Patient declined  Copy of [place] in Chart? No - copy requested No - copy requested No - copy requested    Tobacco Social History   Tobacco Use  Smoking Status Never Smoker  Smokeless Tobacco Never Used     Counseling given: No   Clinical Intake:  Pre-visit preparation completed: Yes  Pain : No/denies pain Pain Score: 0-No pain     Nutritional Status: BMI 25 -29 Overweight Nutritional Risks: None Diabetes: No  How often do you need to have someone help you when you read instructions, pamphlets, or other written materials from your doctor or pharmacy?: 1 - Never What is the last grade level you completed in school?: 12th grade  Interpreter Needed?: No  Comments: pt lives with spouse Information entered by :: LPinson, LPN  Past Medical History:  Diagnosis Date  . Hyperlipidemia   . Ischemic optic neuropathy 2006   [place] at [facility]  . Personal history of colonic adenoma 07/30/2008  . Prediabetes 07/05/2016   A1c 6.5% 03/2014   . Primary osteoarthritis of knee    bilateral s/p L knee replacement, receives  R knee injections Francis Gaines)   Past Surgical History:  Procedure Laterality Date  . COLONOSCOPY  12/2013   no polyps, no rpt due [name])  . TOTAL KNEE ARTHROPLASTY Left 2005   [name]   Family History  Problem Relation Age of Onset  . Blindness Mother 45       ?stroke in eyes  . Lung disease Father        Ecologist  . Arthritis Father   . Colon cancer Neg Hx   . Pancreatic cancer Neg Hx   . Stomach cancer Neg Hx   . Esophageal cancer Neg Hx   . Rectal cancer Neg Hx   . Cancer Neg Hx   . Diabetes Neg Hx   . CAD Neg Hx   . Stroke Neg Hx    Social History   Socioeconomic History  . Marital status: Married    Spouse name: Not on file  . Number of children: Not on file  . Years of education: Not on file  . Highest education level: Not on file  Occupational History  . Not on file  Social Needs  . Financial resource strain: Not on file  . Food insecurity:    Worry: Not on file    Inability: Not on file  . Transportation needs:    Medical: Not on file    Non-medical: Not on file  Tobacco Use  . Smoking status: Never Smoker  .  Smokeless tobacco: Never Used  Substance and Sexual Activity  . Alcohol use: Yes    Comment: occasional  . Drug use: No  . Sexual activity: Not Currently  Lifestyle  . Physical activity:    Days per week: Not on file    Minutes per session: Not on file  . Stress: Not on file  Relationships  . Social connections:    Talks on phone: Not on file    Gets together: Not on file    Attends religious service: Not on file    Active member of club or organization: Not on file    Attends meetings of clubs or organizations: Not on file    Relationship status: Not on file  Other Topics Concern  . Not on file  Social History Narrative   Lives with wife, 2 cats   Occ: retired Personal assistant   Activity: works in yard   Diet: some water, fruits/vegetables daily    Outpatient Encounter Medications as of 09/26/2018  Medication Sig  . atorvastatin  (LIPITOR) 10 MG tablet TAKE 1 TABLET BY MOUTH DAILY   No facility-administered encounter medications on file as of 09/26/2018.     Activities of Daily Living In your present state of health, do you have any difficulty performing the following activities: 09/26/2018  Hearing? N  Vision? N  Difficulty concentrating or making decisions? N  Walking or climbing stairs? N  Dressing or bathing? N  Doing errands, shopping? N  Preparing Food and eating ? N  Using the Toilet? N  In the past six months, have you accidently leaked urine? N  Do you have problems with loss of bowel control? N  Managing your Medications? N  Managing your Finances? N  Housekeeping or managing your Housekeeping? N  Some recent data might be hidden    Patient Care Team: [name], MD as PCP - General (Family Medicine) Specialists, [place] as Consulting Physician (Orthopedic Surgery)   Assessment:   This is a routine wellness examination for Keith Jordan.   Hearing Screening   125Hz  250Hz  500Hz  1000Hz  2000Hz  3000Hz  4000Hz  6000Hz  8000Hz   Right ear:   40 0 40  0    Left ear:   40 0 40  0    Vision Screening Comments: Vision exam in August 2019 @ [facility]   Exercise Activities and Dietary recommendations Current Exercise Habits: Home exercise routine, Type of exercise: strength training/weights, Time (Minutes): 10, Frequency (Times/Week): 3, Weekly Exercise (Minutes/Week): 30, Intensity: Mild, Exercise limited by: None identified  Goals    . Increase physical activity     Starting 09/26/2018, I will continue to exercise for at least 30 min 3 days per week.        Fall Risk Fall Risk  09/26/2018 09/14/2017 09/11/2016  Falls in the past year? No No No   Depression Screen PHQ 2/9 Scores 09/26/2018 09/14/2017 09/11/2016  PHQ - 2 Score 0 0 0  PHQ- 9 Score 0 1 -    Cognitive Function MMSE - Mini Mental State Exam 09/26/2018 09/14/2017 09/11/2016  Orientation to time 5 5 5   Orientation  to Place 5 5 5   Registration 3 3 3   Attention/ Calculation 0 0 0  Recall 3 3 3   Language- name 2 objects 0 0 0  Language- repeat 1 1 1   Language- follow 3 step command 3 3 3   Language- read & follow direction 0 0 0  Write a sentence 0 0 0  Copy design  0 0 0  Total score 20 20 20      PLEASE NOTE: A Mini-Cog screen was completed. Maximum score is 20. A value of 0 denotes this part of [place]stein MMSE was not completed or the patient failed this part of the Mini-Cog screening.   Mini-Cog Screening Orientation to Time - Max 5 pts Orientation to Place - Max 5 pts Registration - Max 3 pts Recall - Max 3 pts Language Repeat - Max 1 pts Language Follow 3 Step Command - Max 3 pts     Immunization History  Administered Date(s) Administered  . Influenza Split 10/30/2013  . Influenza,inj,Quad PF,6+ Mos 09/14/2017, 09/26/2018  . Influenza-Unspecified 09/01/2016  . Pneumococcal Polysaccharide-23 09/14/2017    Screening Tests Health Maintenance  Topic Date Due  . DTaP/Tdap/Td (1 - Tdap) 09/15/2027 (Originally 03/17/1960)  . TETANUS/TDAP  09/15/2027 (Originally 03/17/1960)  . FOOT EXAM  03/26/2019  . HEMOGLOBIN A1C  03/27/2019  . OPHTHALMOLOGY EXAM  07/29/2019  . URINE MICROALBUMIN  09/27/2019  . INFLUENZA VACCINE  Completed  . PNA vac Low Risk Adult  Completed       Plan:   I have personally reviewed, addressed, and noted the following in the patient's chart:  A. Medical and social history B. Use of alcohol, tobacco or illicit drugs  C. Current medications and supplements D. Functional ability and status E.  Nutritional status F.  Physical activity G. Advance directives H. List of other physicians I.  Hospitalizations, surgeries, and ER visits in previous 12 months J.  [place] to include hearing, vision, cognitive, depression L. Referrals and appointments - none  In addition, I have reviewed and discussed with patient certain preventive protocols, quality  metrics, and best practice recommendations. A written personalized care plan for preventive services as well as general preventive health recommendations were provided to patient.  See attached scanned questionnaire for additional information.   Signed,   Lindell Noe, MHA, BS, LPN Health Coach

## 2018-09-27 NOTE — Progress Notes (Signed)
I reviewed health advisor's note, was available for consultation, and agree with documentation and plan.  

## 2018-09-29 ENCOUNTER — Encounter: Payer: Self-pay | Admitting: Family Medicine

## 2018-09-29 ENCOUNTER — Ambulatory Visit (INDEPENDENT_AMBULATORY_CARE_PROVIDER_SITE_OTHER): Payer: Medicare HMO | Admitting: Family Medicine

## 2018-09-29 VITALS — BP 120/62 | HR 76 | Temp 98.3°F | Ht 68.5 in | Wt 152.8 lb

## 2018-09-29 DIAGNOSIS — Z0000 Encounter for general adult medical examination without abnormal findings: Secondary | ICD-10-CM | POA: Diagnosis not present

## 2018-09-29 DIAGNOSIS — E785 Hyperlipidemia, unspecified: Secondary | ICD-10-CM | POA: Diagnosis not present

## 2018-09-29 DIAGNOSIS — N401 Enlarged prostate with lower urinary tract symptoms: Secondary | ICD-10-CM

## 2018-09-29 DIAGNOSIS — R351 Nocturia: Secondary | ICD-10-CM

## 2018-09-29 DIAGNOSIS — N400 Enlarged prostate without lower urinary tract symptoms: Secondary | ICD-10-CM

## 2018-09-29 DIAGNOSIS — R7303 Prediabetes: Secondary | ICD-10-CM

## 2018-09-29 DIAGNOSIS — Z7189 Other specified counseling: Secondary | ICD-10-CM

## 2018-09-29 HISTORY — DX: Benign prostatic hyperplasia without lower urinary tract symptoms: N40.0

## 2018-09-29 MED ORDER — ATORVASTATIN CALCIUM 10 MG PO TABS: 10.0000 mg | ORAL_TABLET | Freq: Every day | ORAL | 3 refills | Status: DC

## 2018-09-29 NOTE — Assessment & Plan Note (Signed)
Preventative protocols reviewed and updated unless pt declined. Discussed healthy diet and lifestyle.  

## 2018-09-29 NOTE — Assessment & Plan Note (Signed)
Advanced directive discussion - has at home. Son has copy. Son Shalik Massingale is HCPOA. Asked to bring copy.  

## 2018-09-29 NOTE — Assessment & Plan Note (Signed)
Noted today. Pt not bothered by nocturia. Will continue to monitor.

## 2018-09-29 NOTE — Assessment & Plan Note (Addendum)
Chronic, stable. Tolerating statin without myalgias. Weight loss contributing to better control. Continue lipitor. The 10-year ASCVD risk score Mikey Bussing DC Brooke Bonito., et al., 2013) is: 42.9%   Values used to calculate the score:     Age: 77 years     Sex: Male     Is Non-Hispanic African American: No     Diabetic: Yes     Tobacco smoker: No     Systolic Blood Pressure: 964 mmHg     Is BP treated: No     HDL Cholesterol: 28.5 mg/dL     Total Cholesterol: 137 mg/dL

## 2018-09-29 NOTE — Assessment & Plan Note (Signed)
A1c continues to improve - will space out visits to [place]. Encouraged limiting simple carbs in diet.

## 2018-09-29 NOTE — Progress Notes (Signed)
BP 120/62 (BP Location: Left Arm, Patient Position: Sitting, Cuff Size: Normal)   Pulse 76   Temp 98.3 F (36.8 C) (Oral)   Ht 5' 8.5" (1.74 m)   Wt 152 lb 12 oz (69.3 kg)   SpO2 96%   BMI 22.89 kg/m    CC: CPE Subjective:    Patient ID: Keith Jordan, male    DOB: [birth year], 77 y.o.   MRN: [id number]  HPI: Keith Jordan is a 77 y.o. male presenting on 09/29/2018 for Annual Exam (Pt 2. )   Saw Katha Cabal last week for medicare wellness visit. Note reviewed.    Preventative: COLONOSCOPY Date: 12/2013 no polyps, no rpt due [name]) Prostate cancer screening - discussed. desires continued screening. Nocturia x2-3  Lung cancer screening - never smoker  Flu shot yearly Tetanus shot unsure Prevnar 2017, pneumovax 2018 Shingrix - discussed Advanced directive discussion - has at home. Son has copy. Son Keith Jordan is HCPOA. Asked to bring copy.  Seat belt use discussed  Sunscreen use discussed, no changing moles on skin  Non smoker  Alcohol use - few glasses of wine, 1 beer a week Dentist Q6 mo  Eye exam yearly   Lives with wife, 2 cats  Occ: retired Personal assistant  Activity: works in yard, new stationary bicycle  Diet: some water, likes flavored sparkling water, fruits/vegetables daily   Relevant past medical, surgical, family and social history reviewed and updated as indicated. Interim medical history since our last visit reviewed. Allergies and medications reviewed and updated. Outpatient Medications Prior to Visit  Medication Sig Dispense Refill  . atorvastatin (LIPITOR) 10 MG tablet TAKE 1 TABLET BY MOUTH DAILY 90 tablet 0   No facility-administered medications prior to visit.      Per HPI unless specifically indicated in ROS section below Review of Systems  Constitutional: Negative for activity change, appetite change, chills, fatigue, fever and unexpected weight change.  HENT: Negative for hearing loss.   Eyes: Negative for visual disturbance.  Respiratory: Negative  for cough, chest tightness, shortness of breath and wheezing.   Cardiovascular: Negative for chest pain, palpitations and leg swelling.  Gastrointestinal: Negative for abdominal distention, abdominal pain, blood in stool, constipation, diarrhea, nausea and vomiting.  Genitourinary: Negative for difficulty urinating and hematuria.  Musculoskeletal: Negative for arthralgias, myalgias and neck pain.  Skin: Negative for rash.  Neurological: Negative for dizziness, seizures, syncope and headaches.  Hematological: Negative for adenopathy. Does not bruise/bleed easily.  Psychiatric/Behavioral: Negative for dysphoric mood. The patient is not nervous/anxious.        Objective:    BP 120/62 (BP Location: Left Arm, Patient Position: Sitting, Cuff Size: Normal)   Pulse 76   Temp 98.3 F (36.8 C) (Oral)   Ht 5' 8.5" (1.74 m)   Wt 152 lb 12 oz (69.3 kg)   SpO2 96%   BMI 22.89 kg/m   Wt Readings from Last 3 Encounters:  09/29/18 152 lb 12 oz (69.3 kg)  09/26/18 152 lb 8 oz (69.2 kg)  03/25/18 163 lb (73.9 kg)    Physical Exam  Constitutional: He is oriented to person, place, and time. He appears well-developed and well-nourished. No distress.  HENT:  Head: Normocephalic and atraumatic.  Right Ear: Hearing, tympanic membrane, external ear and ear canal normal.  Left Ear: Hearing, tympanic membrane, external ear and ear canal normal.  Nose: Nose normal.  Mouth/Throat: Uvula is midline, oropharynx is clear and moist and mucous membranes are normal. No oropharyngeal exudate, posterior  oropharyngeal edema or posterior oropharyngeal erythema.  Eyes: Pupils are equal, round, and reactive to light. Conjunctivae and EOM are normal. No scleral icterus.  Neck: Normal range of motion. Neck supple. Carotid bruit is not present. No thyromegaly present.  Cardiovascular: Normal rate, regular rhythm, normal heart sounds and intact distal pulses.  No murmur heard. Pulses:      Radial pulses are 2+ on the  right side, and 2+ on the left side.  Pulmonary/Chest: Effort normal and breath sounds normal. No respiratory distress. He has no wheezes. He has no rales.  Abdominal: Soft. Bowel sounds are normal. He exhibits no distension and no mass. There is no tenderness. There is no rebound and no guarding.  Genitourinary: Rectum normal. Rectal exam shows no external hemorrhoid, no internal hemorrhoid, no fissure, no mass, no tenderness and anal tone normal. Prostate is enlarged (30gm). Prostate is not tender.  Musculoskeletal: Normal range of motion. He exhibits no edema.  Lymphadenopathy:    He has no cervical adenopathy.  Neurological: He is alert and oriented to person, place, and time.  CN grossly intact, station and gait intact  Skin: Skin is warm and dry. No rash noted.  Psychiatric: He has a normal mood and affect. His behavior is normal. Judgment and thought content normal.  Nursing note and vitals reviewed.  Results for orders placed or performed in visit on 09/26/18  PSA  Result Value Ref Range   PSA 2.94 0.10 - 4.00 ng/mL  Microalbumin / creatinine urine ratio  Result Value Ref Range   Microalb, Ur 2.9 (H) 0.0 - 1.9 mg/dL   Creatinine,U 221.1 mg/dL   Microalb Creat Ratio 1.3 0.0 - 30.0 mg/g  Hemoglobin A1c  Result Value Ref Range   Hgb A1c MFr Bld 6.2 4.6 - 6.5 %  Comprehensive metabolic panel  Result Value Ref Range   Sodium 141 135 - 145 mEq/L   Potassium 4.3 3.5 - 5.1 mEq/L   Chloride 102 96 - 112 mEq/L   CO2 33 (H) 19 - 32 mEq/L   Glucose, Bld 121 (H) 70 - 99 mg/dL   BUN 30 (H) 6 - 23 mg/dL   Creatinine, Ser 1.09 0.40 - 1.50 mg/dL   Total Bilirubin 0.6 0.2 - 1.2 mg/dL   Alkaline Phosphatase 72 39 - 117 U/L   AST 25 0 - 37 U/L   ALT 15 0 - 53 U/L   Total Protein 6.6 6.0 - 8.3 g/dL   Albumin 4.0 3.5 - 5.2 g/dL   Calcium 9.2 8.4 - 10.5 mg/dL   GFR 69.81 >60.00 mL/min  Lipid panel  Result Value Ref Range   Cholesterol 137 0 - 200 mg/dL   Triglycerides 101.0 0.0 - 149.0  mg/dL   HDL 28.50 (L) >39.00 mg/dL   VLDL 20.2 0.0 - 40.0 mg/dL   LDL Cholesterol 88 0 - 99 mg/dL   Total CHOL/HDL Ratio 5    NonHDL 108.34       Assessment & Plan:   Problem List Items Addressed This Visit    Prediabetes    A1c continues to improve - will space out visits to [place]. Encouraged limiting simple carbs in diet.       Hyperlipidemia    Chronic, stable. Tolerating statin without myalgias. Weight loss contributing to better control. Continue lipitor. The 10-year ASCVD risk score Mikey Bussing DC Jr., et al., 2013) is: 42.9%   Values used to calculate the score:     Age: 64 years  Sex: Male     Is Non-Hispanic African American: No     Diabetic: Yes     Tobacco smoker: No     Systolic Blood Pressure: 355 mmHg     Is BP treated: No     HDL Cholesterol: 28.5 mg/dL     Total Cholesterol: 137 mg/dL       Relevant Medications   atorvastatin (LIPITOR) 10 MG tablet   Health maintenance examination - Primary    Preventative protocols reviewed and updated unless pt declined. Discussed healthy diet and lifestyle.       BPH (benign prostatic hyperplasia)    Noted today. Pt not bothered by nocturia. Will continue to monitor.       Advanced care planning/counseling discussion    Advanced directive discussion - has at home. Son has copy. Son Keith Jordan is HCPOA. Asked to bring copy.           Meds ordered this encounter  Medications  . atorvastatin (LIPITOR) 10 MG tablet    Sig: Take 1 tablet (10 mg total) by mouth daily.    Dispense:  90 tablet    Refill:  3   No orders of the defined types were placed in this encounter.   Follow up plan: Return in about 1 year (around 09/30/2019) for annual exam, prior fasting for blood work, medicare wellness visit.  [name], MD

## 2018-09-29 NOTE — Patient Instructions (Addendum)
If interested, check with pharmacy about new 2 shot shingles series (shingrix).  Bring [place] copy of your advanced directive to update your chart Watch weight - let me know if continued loss.  Labs were looking good today - better sugar and cholesterol control. You are doing well today Return as needed or in 1 year for next physical/wellness visit  Health Maintenance, Male A healthy lifestyle and preventive care is important for your health and wellness. Ask your health care provider about what schedule of regular examinations is right for you. What should I know about weight and diet? Eat a Healthy Diet  Eat plenty of vegetables, fruits, whole grains, low-fat dairy products, and lean protein.  Do not eat a lot of foods high in solid fats, added sugars, or salt.  Maintain a Healthy Weight Regular exercise can help you achieve or maintain a healthy weight. You should:  Do at least 150 minutes of exercise each week. The exercise should increase your heart rate and make you sweat (moderate-intensity exercise).  Do strength-training exercises at least twice a week.  Watch Your Levels of Cholesterol and Blood Lipids  Have your blood tested for lipids and cholesterol every 5 years starting at 76 years of age. If you are at high risk for heart disease, you should start having your blood tested when you are 76 years old. You may need to have your cholesterol levels checked more often if: ? Your lipid or cholesterol levels are high. ? You are older than 76 years of age. ? You are at high risk for heart disease.  What should I know about cancer screening? Many types of cancers can be detected early and may often be prevented. Lung Cancer  You should be screened every year for lung cancer if: ? You are a current smoker who has smoked for at least 30 years. ? You are a former smoker who has quit within the past 15 years.  Talk to your health care provider about your screening options, when you  should start screening, and how often you should be screened.  Colorectal Cancer  Routine colorectal cancer screening usually begins at 76 years of age and should be repeated every 5-10 years until you are 76 years old. You may need to be screened more often if early forms of precancerous polyps or small growths are found. Your health care provider may recommend screening at an earlier age if you have risk factors for colon cancer.  Your health care provider may recommend using home test kits to check for hidden blood in the stool.  A small camera at the end of a tube can be used to examine your colon (sigmoidoscopy or colonoscopy). This checks for the earliest forms of colorectal cancer.  Prostate and Testicular Cancer  Depending on your age and overall health, your health care provider may do certain tests to screen for prostate and testicular cancer.  Talk to your health care provider about any symptoms or concerns you have about testicular or prostate cancer.  Skin Cancer  Check your skin from head to toe regularly.  Tell your health care provider about any new moles or changes in moles, especially if: ? There is a change in a mole's size, shape, or color. ? You have a mole that is larger than a pencil eraser.  Always use sunscreen. Apply sunscreen liberally and repeat throughout the day.  Protect yourself by wearing long sleeves, pants, a wide-brimmed hat, and sunglasses when outside.  What  should I know about heart disease, diabetes, and high blood pressure?  If you are 17-40 years of age, have your blood pressure checked every 3-5 years. If you are 46 years of age or older, have your blood pressure checked every year. You should have your blood pressure measured twice-once when you are at a hospital or clinic, and once when you are not at a hospital or clinic. Record the average of the two measurements. To check your blood pressure when you are not at a hospital or clinic, you  can use: ? An automated blood pressure machine at a pharmacy. ? A home blood pressure monitor.  Talk to your health care provider about your target blood pressure.  If you are between 18-44 years old, ask your health care provider if you should take aspirin to prevent heart disease.  Have regular diabetes screenings by checking your fasting blood sugar level. ? If you are at a normal weight and have a low risk for diabetes, have this test once every three years after the age of 49. ? If you are overweight and have a high risk for diabetes, consider being tested at a younger age or more often.  A one-time screening for abdominal aortic aneurysm (AAA) by ultrasound is recommended for men aged 49-75 years who are current or former smokers. What should I know about preventing infection? Hepatitis B If you have a higher risk for hepatitis B, you should be screened for this virus. Talk with your health care provider to find out if you are at risk for hepatitis B infection. Hepatitis C Blood testing is recommended for:  Everyone born from [birth year] through 1964.  Anyone with known risk factors for hepatitis C.  Sexually Transmitted Diseases (STDs)  You should be screened each year for STDs including gonorrhea and chlamydia if: ? You are sexually active and are younger than 76 years of age. ? You are older than 76 years of age and your health care provider tells you that you are at risk for this type of infection. ? Your sexual activity has changed since you were last screened and you are at an increased risk for chlamydia or gonorrhea. Ask your health care provider if you are at risk.  Talk with your health care provider about whether you are at high risk of being infected with HIV. Your health care provider may recommend a prescription medicine to help prevent HIV infection.  What else can I do?  Schedule regular health, dental, and eye exams.  Stay current with your vaccines  (immunizations).  Do not use any tobacco products, such as cigarettes, chewing tobacco, and e-cigarettes. If you need help quitting, ask your health care provider.  Limit alcohol intake to no more than 2 drinks per day. One drink equals 12 ounces of beer, 5 ounces of wine, or 1 ounces of hard liquor.  Do not use street drugs.  Do not share needles.  Ask your health care provider for help if you need support or information about quitting drugs.  Tell your health care provider if you often feel depressed.  Tell your health care provider if you have ever been abused or do not feel safe at home. This information is not intended to replace advice given to you by your health care provider. Make sure you discuss any questions you have with your health care provider. Document Released: 06/11/2008 Document Revised: 08/12/2016 Document Reviewed: 09/17/2015 Elsevier Interactive Patient Education  [employer].

## 2018-10-25 DIAGNOSIS — R69 Illness, unspecified: Secondary | ICD-10-CM | POA: Diagnosis not present

## 2019-02-09 DIAGNOSIS — M25561 Pain in right knee: Secondary | ICD-10-CM | POA: Diagnosis not present

## 2019-02-09 DIAGNOSIS — M1711 Unilateral primary osteoarthritis, right knee: Secondary | ICD-10-CM | POA: Diagnosis not present

## 2019-02-20 DIAGNOSIS — M25561 Pain in right knee: Secondary | ICD-10-CM | POA: Diagnosis not present

## 2019-02-20 DIAGNOSIS — M1711 Unilateral primary osteoarthritis, right knee: Secondary | ICD-10-CM | POA: Diagnosis not present

## 2019-02-27 DIAGNOSIS — M25561 Pain in right knee: Secondary | ICD-10-CM | POA: Diagnosis not present

## 2019-02-27 DIAGNOSIS — M1711 Unilateral primary osteoarthritis, right knee: Secondary | ICD-10-CM | POA: Diagnosis not present

## 2019-03-02 DIAGNOSIS — H1851 Endothelial corneal dystrophy: Secondary | ICD-10-CM | POA: Diagnosis not present

## 2019-03-06 DIAGNOSIS — M1711 Unilateral primary osteoarthritis, right knee: Secondary | ICD-10-CM | POA: Diagnosis not present

## 2019-03-06 DIAGNOSIS — M25561 Pain in right knee: Secondary | ICD-10-CM | POA: Diagnosis not present

## 2019-03-13 DIAGNOSIS — M25561 Pain in right knee: Secondary | ICD-10-CM | POA: Diagnosis not present

## 2019-03-13 DIAGNOSIS — M1711 Unilateral primary osteoarthritis, right knee: Secondary | ICD-10-CM | POA: Diagnosis not present

## 2019-09-27 ENCOUNTER — Other Ambulatory Visit: Payer: Self-pay | Admitting: Family Medicine

## 2019-09-27 DIAGNOSIS — N401 Enlarged prostate with lower urinary tract symptoms: Secondary | ICD-10-CM

## 2019-09-27 DIAGNOSIS — R7303 Prediabetes: Secondary | ICD-10-CM

## 2019-09-27 DIAGNOSIS — E785 Hyperlipidemia, unspecified: Secondary | ICD-10-CM

## 2019-09-28 ENCOUNTER — Other Ambulatory Visit (INDEPENDENT_AMBULATORY_CARE_PROVIDER_SITE_OTHER): Payer: Medicare HMO

## 2019-09-28 DIAGNOSIS — R351 Nocturia: Secondary | ICD-10-CM

## 2019-09-28 DIAGNOSIS — N401 Enlarged prostate with lower urinary tract symptoms: Secondary | ICD-10-CM

## 2019-09-28 DIAGNOSIS — R7303 Prediabetes: Secondary | ICD-10-CM | POA: Diagnosis not present

## 2019-09-28 DIAGNOSIS — E785 Hyperlipidemia, unspecified: Secondary | ICD-10-CM

## 2019-09-28 LAB — COMPREHENSIVE METABOLIC PANEL
ALT: 15 U/L (ref 0–53)
AST: 24 U/L (ref 0–37)
Albumin: 4 g/dL (ref 3.5–5.2)
Alkaline Phosphatase: 74 U/L (ref 39–117)
BUN: 25 mg/dL — ABNORMAL HIGH (ref 6–23)
CO2: 31 mEq/L (ref 19–32)
Calcium: 9.4 mg/dL (ref 8.4–10.5)
Chloride: 102 mEq/L (ref 96–112)
Creatinine, Ser: 1.07 mg/dL (ref 0.40–1.50)
GFR: 66.92 mL/min (ref >60.00)
Glucose, Bld: 115 mg/dL — ABNORMAL HIGH (ref 70–99)
Potassium: 4.3 mEq/L (ref 3.5–5.1)
Sodium: 140 mEq/L (ref 135–145)
Total Bilirubin: 0.8 mg/dL (ref 0.2–1.2)
Total Protein: 6.5 g/dL (ref 6.0–8.3)

## 2019-09-28 LAB — LIPID PANEL
Cholesterol: 123 mg/dL (ref 0–200)
HDL: 32.4 mg/dL — ABNORMAL LOW (ref >39.00)
LDL Cholesterol: 72 mg/dL (ref 0–99)
NonHDL: 90.18
Total CHOL/HDL Ratio: 4
Triglycerides: 93 mg/dL (ref 0.0–149.0)
VLDL: 18.6 mg/dL (ref 0.0–40.0)

## 2019-09-28 LAB — PSA: PSA: 3.12 ng/mL (ref 0.10–4.00)

## 2019-09-28 LAB — HEMOGLOBIN A1C: Hgb A1c MFr Bld: 6.3 % (ref 4.6–6.5)

## 2019-10-04 ENCOUNTER — Ambulatory Visit: Payer: Medicare HMO

## 2019-10-04 ENCOUNTER — Other Ambulatory Visit: Payer: Self-pay

## 2019-10-04 ENCOUNTER — Ambulatory Visit (INDEPENDENT_AMBULATORY_CARE_PROVIDER_SITE_OTHER): Payer: Medicare HMO | Admitting: Family Medicine

## 2019-10-04 ENCOUNTER — Encounter: Payer: Self-pay | Admitting: Family Medicine

## 2019-10-04 VITALS — BP 134/68 | HR 67 | Temp 98.3°F | Ht 68.5 in | Wt 155.3 lb

## 2019-10-04 DIAGNOSIS — Z23 Encounter for immunization: Secondary | ICD-10-CM | POA: Diagnosis not present

## 2019-10-04 DIAGNOSIS — R7303 Prediabetes: Secondary | ICD-10-CM

## 2019-10-04 DIAGNOSIS — Z0000 Encounter for general adult medical examination without abnormal findings: Secondary | ICD-10-CM

## 2019-10-04 DIAGNOSIS — N401 Enlarged prostate with lower urinary tract symptoms: Secondary | ICD-10-CM

## 2019-10-04 DIAGNOSIS — E785 Hyperlipidemia, unspecified: Secondary | ICD-10-CM

## 2019-10-04 DIAGNOSIS — Z7189 Other specified counseling: Secondary | ICD-10-CM

## 2019-10-04 DIAGNOSIS — R351 Nocturia: Secondary | ICD-10-CM

## 2019-10-04 HISTORY — DX: Encounter for general adult medical examination without abnormal findings: Z00.00

## 2019-10-04 NOTE — Assessment & Plan Note (Signed)
Advanced directive discussion - has at home. Son has copy. Son Ziad Sult is HCPOA. Asked to bring copy.  

## 2019-10-04 NOTE — Progress Notes (Signed)
This visit was conducted in person.  BP 134/68 (BP Location: Left Arm, Patient Position: Sitting, Cuff Size: Normal)    Pulse 67    Temp 98.3 F (36.8 C) (Temporal)    Ht 5' 8.5" (1.74 m)    Wt 155 lb 5 oz (70.4 kg)    SpO2 96%    BMI 23.27 kg/m    CC: AMW/CPE Subjective:    Patient ID: Keith Jordan, male    DOB: 04/25/1941, 78 y.o.   MRN: [id number]  HPI: Keith Jordan is a 78 y.o. male presenting on 10/04/2019 for Medicare Wellness   Did not see health advisor this year.    Hearing Screening   125Hz  250Hz  500Hz  1000Hz  2000Hz  3000Hz  4000Hz  6000Hz  8000Hz   Right ear:   40 0 0  0    Left ear:   20 0 0  0    Comments: Pt was previously recommended to have hearing checked.   Visual Acuity Screening   Right eye Left eye Both eyes  Without correction: 20/40 20/20 20/20   With correction:     Comments: Has eye exam scheduled 10/2019.     Office Visit from 10/04/2019 in [place] at [place]  PHQ-2 Total Score  0      Fall Risk  10/04/2019 09/26/2018 09/14/2017 09/11/2016  Falls in the past year? 0 No No No   Noted memory trouble - 1/3 with cue, difficulty with calculation as well.   Preventative: COLONOSCOPY Date: 12/2013 no polyps, no rpt due Keith Jordan) Prostate cancer screening - discussed.desires continued screening. Nocturia x2-3. Denies trouble with stream.  Lung cancer screening - never smoker  Flu shot yearly Tetanus shot unsure Prevnar 2017, pneumovax 2018 Shingrix - discussed - to check with pharmacy. He had mild shingles in the past  Advanced directive discussion - has at home. Keith has copy. Keith Jordan is HCPOA. Asked to bring copy.  Seat belt use discussed  Sunscreen use discussed, no changing moles on skin  Non smoker  Alcohol use - few glasses of wine, 1 beer a week  Dentist Q73mo  Eye exam yearly  Bowel - no constipation Bladder - no incontinence  Lives with wife, 2 cats  Occ: retired Personal assistant  Activity: works in yard, new stationary  bicycle  Diet: some water, likes flavored sparkling water, fruits/vegetables daily     Relevant past medical, surgical, family and social history reviewed and updated as indicated. Interim medical history since our last visit reviewed. Allergies and medications reviewed and updated. Outpatient Medications Prior to Visit  Medication Sig Dispense Refill   atorvastatin (LIPITOR) 10 MG tablet Take 1 tablet (10 mg total) by mouth daily. 90 tablet 3   No facility-administered medications prior to visit.      Per HPI unless specifically indicated in ROS section below Review of Systems  Constitutional: Negative for activity change, appetite change, chills, fatigue, fever and unexpected weight change.  HENT: Negative for hearing loss.   Eyes: Negative for visual disturbance.  Respiratory: Negative for cough, chest tightness, shortness of breath and wheezing.   Cardiovascular: Negative for chest pain, palpitations and leg swelling.  Gastrointestinal: Negative for abdominal distention, abdominal pain, blood in stool, constipation, diarrhea, nausea and vomiting.  Genitourinary: Negative for difficulty urinating and hematuria.  Musculoskeletal: Negative for arthralgias, myalgias and neck pain.  Skin: Negative for rash.  Neurological: Negative for dizziness, seizures, syncope and headaches.  Hematological: Negative for adenopathy. Bruises/bleeds easily.  Psychiatric/Behavioral: Negative for dysphoric mood.  The patient is not nervous/anxious.    Objective:    BP 134/68 (BP Location: Left Arm, Patient Position: Sitting, Cuff Size: Normal)    Pulse 67    Temp 98.3 F (36.8 C) (Temporal)    Ht 5' 8.5" (1.74 m)    Wt 155 lb 5 oz (70.4 kg)    SpO2 96%    BMI 23.27 kg/m   Wt Readings from Last 3 Encounters:  10/04/19 155 lb 5 oz (70.4 kg)  09/29/18 152 lb 12 oz (69.3 kg)  09/26/18 152 lb 8 oz (69.2 kg)    Physical Exam Vitals signs and nursing note reviewed.  Constitutional:      General: He is  not in acute distress.    Appearance: Normal appearance. He is well-developed. He is not ill-appearing.  HENT:     Head: Normocephalic and atraumatic.     Right Ear: Hearing, tympanic membrane, ear canal and external ear normal.     Left Ear: Hearing, tympanic membrane, ear canal and external ear normal.     Nose: Nose normal.     Mouth/Throat:     Mouth: Mucous membranes are moist.     Pharynx: Oropharynx is clear. Uvula midline. No posterior oropharyngeal erythema.  Eyes:     General: No scleral icterus.    Conjunctiva/sclera: Conjunctivae normal.     Pupils: Pupils are equal, round, and reactive to light.  Neck:     Musculoskeletal: Normal range of motion and neck supple.     Vascular: No carotid bruit.  Cardiovascular:     Rate and Rhythm: Normal rate and regular rhythm.     Pulses: Normal pulses.          Radial pulses are 2+ on the right side and 2+ on the left side.     Heart sounds: Normal heart sounds. No murmur.  Pulmonary:     Effort: Pulmonary effort is normal. No respiratory distress.     Breath sounds: Normal breath sounds. No wheezing, rhonchi or rales.  Abdominal:     General: Abdomen is flat. Bowel sounds are normal. There is no distension.     Palpations: Abdomen is soft. There is no mass.     Tenderness: There is no abdominal tenderness. There is no guarding or rebound.     Hernia: No hernia is present.  Musculoskeletal: Normal range of motion.     Right lower leg: No edema.     Left lower leg: No edema.  Lymphadenopathy:     Cervical: No cervical adenopathy.  Skin:    General: Skin is warm and dry.     Findings: No rash.  Neurological:     General: No focal deficit present.     Mental Status: He is alert and oriented to person, place, and time.     Comments:  CN grossly intact, station and gait intact Recall 0/3, 1/3 with cue Calculation 2/5 D-O-L-W - endorses difficulty with spelling  Psychiatric:        Mood and Affect: Mood normal.         Behavior: Behavior normal.        Thought Content: Thought content normal.        Judgment: Judgment normal.       Results for orders placed or performed in visit on 09/28/19  PSA  Result Value Ref Range   PSA 3.12 0.10 - 4.00 ng/mL  Hemoglobin A1c  Result Value Ref Range   Hgb A1c MFr Bld 6.3  4.6 - 6.5 %  Comprehensive metabolic panel  Result Value Ref Range   Sodium 140 135 - 145 mEq/L   Potassium 4.3 3.5 - 5.1 mEq/L   Chloride 102 96 - 112 mEq/L   CO2 31 19 - 32 mEq/L   Glucose, Bld 115 (H) 70 - 99 mg/dL   BUN 25 (H) 6 - 23 mg/dL   Creatinine, Ser 1.07 0.40 - 1.50 mg/dL   Total Bilirubin 0.8 0.2 - 1.2 mg/dL   Alkaline Phosphatase 74 39 - 117 U/L   AST 24 0 - 37 U/L   ALT 15 0 - 53 U/L   Total Protein 6.5 6.0 - 8.3 g/dL   Albumin 4.0 3.5 - 5.2 g/dL   Calcium 9.4 8.4 - 10.5 mg/dL   GFR 66.92 >60.00 mL/min  Lipid panel  Result Value Ref Range   Cholesterol 123 0 - 200 mg/dL   Triglycerides 93.0 0.0 - 149.0 mg/dL   HDL 32.40 (L) >39.00 mg/dL   VLDL 18.6 0.0 - 40.0 mg/dL   LDL Cholesterol 72 0 - 99 mg/dL   Total CHOL/HDL Ratio 4    NonHDL 90.18    Assessment & Plan:   Problem List Items Addressed This Visit    Prediabetes    Encouraged watching added sugars in diet.       Medicare annual wellness visit, subsequent - Primary    I have personally reviewed the Medicare Annual Wellness questionnaire and have noted 1. The patient's medical and social history 2. Their use of alcohol, tobacco or illicit drugs 3. Their current medications and supplements 4. The patient's functional ability including ADL's, fall risks, home safety risks and hearing or visual impairment. Cognitive function has been assessed and addressed as indicated.  5. Diet and physical activity 6. Evidence for depression or mood disorders The patients weight, height, BMI have been recorded in the chart. I have made referrals, counseling and provided education to the patient based on review of the above  and I have provided the pt with a written personalized care plan for preventive services. Provider list updated.. See scanned questionairre as needed for further documentation. Reviewed preventative protocols and updated unless pt declined.       Hyperlipidemia    Chronic, stable on low dose lipitor- continue. The ASCVD Risk score Mikey Bussing DC Jr., et al., 2013) failed to calculate for the following reasons:   The valid total cholesterol range is 130 to 320 mg/dL       Health maintenance examination    Preventative protocols reviewed and updated unless pt declined. Discussed healthy diet and lifestyle.       BPH (benign prostatic hyperplasia)    Stable period. Denies significant daytime or bothersome symptoms. Discussed medication if needed. DRE deferred.      Advanced care planning/counseling discussion    Advanced directive discussion - has at home. Keith has copy. Keith Jordan is HCPOA. Asked to bring copy.        Other Visit Diagnoses    Need for influenza vaccination       Relevant Orders   Flu Vaccine QUAD High Dose(Fluad) (Completed)       No orders of the defined types were placed in this encounter.  Orders Placed This Encounter  Procedures   Flu Vaccine QUAD High Dose(Fluad)    Patient instructions: Flu shot today.  Let us know when ready to see audiologist and we will place referral.  If interested, check with pharmacy about new 2  shot shingles series (shingrix).  Bring [place] copy of your living will.  Try mucinex or guaifenesin for congestion.  We noted a little trouble with the memory today. Continue regular reading, healthy diet, regular exercise routine. If noticing more trouble, let me know sooner for formal memory testing.  You are doing well today. Return as needed or in 1 year for next physical.   Follow up plan: Return in about 1 year (around 10/03/2020) for annual exam, prior fasting for blood work, medicare wellness visit.  [name], MD

## 2019-10-04 NOTE — Assessment & Plan Note (Signed)
Preventative protocols reviewed and updated unless pt declined. Discussed healthy diet and lifestyle.  

## 2019-10-04 NOTE — Assessment & Plan Note (Signed)

## 2019-10-04 NOTE — Assessment & Plan Note (Signed)
Encouraged watching added sugars in diet.  

## 2019-10-04 NOTE — Patient Instructions (Addendum)
Flu shot today.  Let us know when ready to see audiologist and we will place referral.  If interested, check with pharmacy about new 2 shot shingles series (shingrix).  Bring [place] copy of your living will.  Try mucinex or guaifenesin for congestion.  We noted a little trouble with the memory today. Continue regular reading, healthy diet, regular exercise routine. If noticing more trouble, let me know sooner for formal memory testing.  You are doing well today. Return as needed or in 1 year for next physical.   Health Maintenance After Age 77 After age 82, you are at a higher risk for certain long-term diseases and infections as well as injuries from falls. Falls are a major cause of broken bones and head injuries in people who are older than age 68. Getting regular preventive care can help to keep you healthy and well. Preventive care includes getting regular testing and making lifestyle changes as recommended by your health care provider. Talk with your health care provider about:  Which screenings and tests you should have. A screening is a test that checks for a disease when you have no symptoms.  A diet and exercise plan that is right for you. What should I know about screenings and tests to prevent falls? Screening and testing are the best ways to find a health problem early. Early diagnosis and treatment give you the best chance of managing medical conditions that are common after age 55. Certain conditions and lifestyle choices may make you more likely to have a fall. Your health care provider may recommend:  Regular vision checks. Poor vision and conditions such as cataracts can make you more likely to have a fall. If you wear glasses, make sure to get your prescription updated if your vision changes.  Medicine review. Work with your health care provider to regularly review all of the medicines you are taking, including over-the-counter medicines. Ask your health care provider about any side  effects that may make you more likely to have a fall. Tell your health care provider if any medicines that you take make you feel dizzy or sleepy.  Osteoporosis screening. Osteoporosis is a condition that causes the bones to get weaker. This can make the bones weak and cause them to break more easily.  Blood pressure screening. Blood pressure changes and medicines to control blood pressure can make you feel dizzy.  Strength and balance checks. Your health care provider may recommend certain tests to check your strength and balance while standing, walking, or changing positions.  Foot health exam. Foot pain and numbness, as well as not wearing proper footwear, can make you more likely to have a fall.  Depression screening. You may be more likely to have a fall if you have a fear of falling, feel emotionally low, or feel unable to do activities that you used to do.  Alcohol use screening. Using too much alcohol can affect your balance and may make you more likely to have a fall. What actions can I take to lower my risk of falls? General instructions  Talk with your health care provider about your risks for falling. Tell your health care provider if: ? You fall. Be sure to tell your health care provider about all falls, even ones that seem minor. ? You feel dizzy, sleepy, or off-balance.  Take over-the-counter and prescription medicines only as told by your health care provider. These include any supplements.  Eat a healthy diet and maintain a healthy weight. A  healthy diet includes low-fat dairy products, low-fat (lean) meats, and fiber from whole grains, beans, and lots of fruits and vegetables. Home safety  Remove any tripping hazards, such as rugs, cords, and clutter.  Install safety equipment such as grab bars in bathrooms and safety rails on stairs.  Keep rooms and walkways well-lit. Activity   Follow a regular exercise program to stay fit. This will help you maintain your  balance. Ask your health care provider what types of exercise are appropriate for you.  If you need a cane or walker, use it as recommended by your health care provider.  Wear supportive shoes that have nonskid soles. Lifestyle  Do not drink alcohol if your health care provider tells you not to drink.  If you drink alcohol, limit how much you have: ? 0-1 drink a day for women. ? 0-2 drinks a day for men.  Be aware of how much alcohol is in your drink. In the [place] one drink equals one typical bottle of beer (12 oz), one-half glass of wine (5 oz), or one shot of hard liquor (1 oz).  Do not use any products that contain nicotine or tobacco, such as cigarettes and e-cigarettes. If you need help quitting, ask your health care provider. Summary  Having a healthy lifestyle and getting preventive care can help to protect your health and wellness after age 73.  Screening and testing are the best way to find a health problem early and help you avoid having a fall. Early diagnosis and treatment give you the best chance for managing medical conditions that are more common for people who are older than age 70.  Falls are a major cause of broken bones and head injuries in people who are older than age 69. Take precautions to prevent a fall at home.  Work with your health care provider to learn what changes you can make to improve your health and wellness and to prevent falls. This information is not intended to replace advice given to you by your health care provider. Make sure you discuss any questions you have with your health care provider. Document Released: 10/27/2017 Document Revised: 04/06/2019 Document Reviewed: 10/27/2017 Elsevier Patient Education  2020 [employer].

## 2019-10-04 NOTE — Assessment & Plan Note (Signed)
Chronic, stable on low dose lipitor - continue. The ASCVD Risk score (Goff DC Jr., et al., 2013) failed to calculate for the following reasons:   The valid total cholesterol range is 130 to 320 mg/dL  

## 2019-10-04 NOTE — Assessment & Plan Note (Signed)
Stable period. Denies significant daytime or bothersome symptoms. Discussed medication if needed. DRE deferred.

## 2019-11-01 DIAGNOSIS — H18513 Endothelial corneal dystrophy, bilateral: Secondary | ICD-10-CM | POA: Diagnosis not present

## 2019-11-01 DIAGNOSIS — H353132 Nonexudative age-related macular degeneration, bilateral, intermediate dry stage: Secondary | ICD-10-CM | POA: Diagnosis not present

## 2019-12-13 ENCOUNTER — Other Ambulatory Visit: Payer: Self-pay | Admitting: Family Medicine

## 2020-01-23 ENCOUNTER — Other Ambulatory Visit: Payer: Self-pay

## 2020-01-23 ENCOUNTER — Encounter: Payer: Self-pay | Admitting: General Surgery

## 2020-01-23 ENCOUNTER — Ambulatory Visit: Payer: Medicare HMO | Admitting: General Surgery

## 2020-01-23 VITALS — BP 152/76 | HR 91 | Temp 98.1°F | Ht 68.5 in | Wt 160.0 lb

## 2020-01-23 DIAGNOSIS — L723 Sebaceous cyst: Secondary | ICD-10-CM | POA: Diagnosis not present

## 2020-01-23 MED ORDER — CEPHALEXIN 500 MG PO CAPS: 500.0000 mg | ORAL_CAPSULE | Freq: Four times a day (QID) | ORAL | 0 refills | Status: AC

## 2020-01-23 NOTE — Progress Notes (Signed)
Patient ID: LITO PENT, male   DOB: 1941-12-21, 79 y.o.   MRN: [id number]  Chief Complaint  Patient presents with  . New Patient (Initial Visit)    cyst on hip    HPI Keith Jordan is a 79 y.o. male.  He is here today for evaluation of a cyst on his right hip.  He states that it started out as about the size of a pinhead, but has progressively enlarged.  It is tender and rubs against his clothing.  He also finds it difficult to sleep due to the discomfort.  He reports having a similar cyst on the back of his neck that does not bother him.  He also reports having had one removed from the back of his neck in the past.  He denies any fevers or chills.  No nausea or vomiting.  There has been no drainage from the site.  The discomfort is localized to the area of the cyst.   Past Medical History:  Diagnosis Date  . Hyperlipidemia   . Ischemic optic neuropathy 2006   [place] at [facility]  . Personal history of colonic adenoma 07/30/2008  . Prediabetes 07/05/2016   A1c 6.5% 03/2014   . Primary osteoarthritis of knee    bilateral s/p L knee replacement, receives R knee injections Francis Gaines)    Past Surgical History:  Procedure Laterality Date  . COLONOSCOPY  12/2013   no polyps, no rpt due [name])  . TOTAL KNEE ARTHROPLASTY Left 2005   [name]    Family History  Problem Relation Age of Onset  . Blindness Mother 58       ?stroke in eyes  . Lung disease Father        Ecologist  . Arthritis Father   . Colon cancer Neg Hx   . Pancreatic cancer Neg Hx   . Stomach cancer Neg Hx   . Esophageal cancer Neg Hx   . Rectal cancer Neg Hx   . Cancer Neg Hx   . Diabetes Neg Hx   . CAD Neg Hx   . Stroke Neg Hx     Social History Social History   Tobacco Use  . Smoking status: Never Smoker  . Smokeless tobacco: Never Used  Substance Use Topics  . Alcohol use: Yes    Comment: occasional  . Drug use: No    No Known Allergies  Current Outpatient Medications  Medication  Sig Dispense Refill  . aspirin EC 81 MG tablet Take 81 mg by mouth daily.    Marland Kitchen atorvastatin (LIPITOR) 10 MG tablet TAKE 1 TABLET BY MOUTH EVERY DAY 90 tablet 3  . Multiple Vitamins-Minerals (PRESERVISION AREDS) CAPS daily.    . cephALEXin (KEFLEX) 500 MG capsule Take 1 capsule (500 mg total) by mouth 4 (four) times daily for 10 days. 40 capsule 0   No current facility-administered medications for this visit.    Review of Systems Review of Systems  All other systems reviewed and are negative. Or as discussed in the history of present illness  Blood pressure (!) 152/76, pulse 91, temperature 98.1 F (36.7 C), height 5' 8.5" (1.74 m), weight 160 lb (72.6 kg), SpO2 95 %.  Physical Exam Physical Exam Constitutional:      General: He is not in acute distress.    Appearance: He is normal weight.  HENT:     Head: Normocephalic and atraumatic.     Nose:     Comments: Covered with a mask  secondary to COVID-19 precautions    Mouth/Throat:     Comments: Covered with a mask secondary to COVID-19 precautions Eyes:     General: No scleral icterus.       Right eye: No discharge.        Left eye: No discharge.  Cardiovascular:     Rate and Rhythm: Normal rate and regular rhythm.  Pulmonary:     Effort: Pulmonary effort is normal. No respiratory distress.  Abdominal:     General: Abdomen is flat. Bowel sounds are normal.     Palpations: Abdomen is soft.  Genitourinary:    Comments: Deferred Musculoskeletal:     Cervical back: Normal range of motion. No rigidity.     Comments: Trace bilateral lower extremity ankle edema.  Lymphadenopathy:     Cervical: No cervical adenopathy.  Skin:         Comments: On the back of the neck, there is a small, approximately 1 cm, well-circumscribed mobile mass in the subcutaneum.  It is most consistent with a sebaceous cyst.  On the posterior right hip, there is a similar lesion, however it is about 6 cm in diameter and has some surrounding erythema and  induration.  Neurological:     General: No focal deficit present.     Mental Status: He is alert and oriented to person, place, and time.  Psychiatric:        Mood and Affect: Mood normal.        Behavior: Behavior normal.     Data Reviewed There are no pertinent data available for review  Assessment This is a 79 year old man with the lesion on his posterior right hip most consistent with an infected sebaceous cyst.  I have offered him excision today in clinic.  The risks of the procedure were discussed with him, including bleeding, infection, recurrence, scar, pain, need for additional procedures.  He would like to proceed.  Plan Sebaceous Cyst Excision Procedure Note  Pre-operative Diagnosis: Sebaceous cyst  Post-operative Diagnosis: same, infected  Locations:right Posterior hip  Indications: Tender and growing sebaceous cyst  Anesthesia: Lidocaine 1% with epinephrine without added sodium bicarbonate  Procedure Details  History of allergy to iodine: no  Patient informed of the risks (including bleeding and infection) and benefits of the  procedure and Written informed consent obtained.  The lesion and surrounding area was given a sterile prep using chlorhexidine and draped in the usual sterile fashion. An incision was made over the cyst, which was dissected free of the surrounding tissue and removed.  The cyst was filled with typical sebaceous material, as well as malodorous purulent material.  A culture was obtained.  The wound was closed with 3-0 Nylon using a baseball stitch. Antibiotic ointment and a sterile dressing applied.  The specimen was not sent for pathologic examination. The patient tolerated the procedure well.  EBL: Less than 2 ml  Findings: Cyst removed with capsule intact.  Purulent drainage identified and cultured.  Condition: Stable  Complications: none.  Plan: 1. Instructed to keep the wound dry and covered for 24-48h and clean thereafter. 2.  Warning signs of infection were reviewed.   3. Recommended that the patient use OTC analgesics as needed for pain.  4. Return for suture removal in 1 week. 5.  10-day course of Keflex prescribed.  Fredirick Maudlin 01/23/2020, 11:14 AM

## 2020-01-23 NOTE — Patient Instructions (Addendum)
We have removed a Cyst in our office today.  Take you antibiotics as prescribed. You may take Ibuprofen or Tylenol as needed. May use an ice pack to the area.   You may shower in 48 hours, this is on 01/25/20-remove the dressing before showering. Rinse well, and pat dry. Place a dry dressing over the area and change once daily and as needed. May use antibiotic ointment to the area if needed.   Avoid Strenuous activities that will make you sweat during the next 48 hours to avoid the glue coming off prematurely. Avoid activities that will place pressure to this area of the body for 1-2 weeks to avoid re-injury to incision site. Follow up here in one week for suture removal.     Excision of Skin Cysts or Lesions Excision of a skin lesion refers to the removal of a section of skin by making small cuts (incisions) in the skin. This procedure may be done to remove a cancerous (malignant) or noncancerous (benign) growth on the skin. It is typically done to treat or prevent cancer or infection. It may also be done to improve cosmetic appearance. The procedure may be done to remove:  Cancerous growths, such as basal cell carcinoma, squamous cell carcinoma, or melanoma.  Noncancerous growths, such as a cyst or lipoma.  Growths, such as moles or skin tags, which may be removed for cosmetic reasons.  Various excision or surgical techniques may be used depending on your condition, the location of the lesion, and your overall health. Tell a health care provider about:  Any allergies you have.  All medicines you are taking, including vitamins, herbs, eye drops, creams, and over-the-counter medicines.  Any problems you or family members have had with anesthetic medicines.  Any blood disorders you have.  Any surgeries you have had.  Any medical conditions you have.  Whether you are pregnant or may be pregnant. What are the risks? Generally, this is a safe procedure. However, problems may occur,  including:  Bleeding.  Infection.  Scarring.  Recurrence of the cyst, lipoma, or cancer.  Changes in skin sensation or appearance, such as discoloration or swelling.  Reaction to the anesthetics.  Allergic reaction to surgical materials or ointments.  Damage to nerves, blood vessels, muscles, or other structures.  Continued pain.  What happens before the procedure?  Ask your health care provider about: ? Changing or stopping your regular medicines. This is especially important if you are taking diabetes medicines or blood thinners. ? Taking medicines such as aspirin and ibuprofen. These medicines can thin your blood. Do not take these medicines before your procedure if your health care provider instructs you not to.  You may be asked to take certain medicines.  You may be asked to stop smoking.  You may have an exam or testing.  Plan to have someone take you home after the procedure.  Plan to have someone help you with activities during recovery. What happens during the procedure?  To reduce your risk of infection: ? Your health care team will wash or sanitize their hands. ? Your skin will be washed with soap.  You will be given a medicine to numb the area (local anesthetic).  One of the following excision techniques will be performed.  At the end of any of these procedures, antibiotic ointment will be applied as needed. Each of the following techniques may vary among health care providers and hospitals. Complete Surgical Excision The area of skin that needs to be  removed will be marked with a pen. Using a small scalpel or scissors, the surgeon will gently cut around and under the lesion until it is completely removed. The lesion will be placed in a fluid and sent to the lab for examination. If necessary, bleeding will be controlled with a device that delivers heat (electrocautery). The edges of the wound may be stitched (sutured) together, and a bandage (dressing)  will be applied. This procedure may be performed to treat a cancerous growth or a noncancerous cyst or lesion. Excision of a Cyst The surgeon will make an incision on the cyst. The entire cyst will be removed through the incision. The incision may be closed with sutures. Shave Excision During shave excision, the surgeon will use a small blade or an electrically heated loop instrument to shave off the lesion. This may be done to remove a mole or a skin tag. The wound will usually be left to heal on its own without sutures. Punch Excision During punch excision, the surgeon will use a small tool that is like a cookie cutter or a hole punch to cut a circle shape out of the skin. The outer edges of the skin will be sutured together. This may be done to remove a mole or a scar or to perform a biopsy of the lesion. Mohs Micrographic Surgery During Mohs micrographic surgery, layers of the lesion will be removed with a scalpel or a loop instrument and will be examined right away under a microscope. Layers will be removed until all of the abnormal or cancerous tissue has been removed. This procedure is minimally invasive, and it ensures the best cosmetic outcome. It involves the removal of as little normal tissue as possible. Mohs is usually done to treat skin cancer, such as basal cell carcinoma or squamous cell carcinoma, particularly on the face and ears. Depending on the size of the surgical wound, it may be sutured closed. What happens after the procedure?  Return to your normal activities as told by your health care provider.  Talk with your health care provider to discuss any test results, treatment options, and if necessary, the need for more tests. This information is not intended to replace advice given to you by your health care provider. Make sure you discuss any questions you have with your health care provider. Document Released: 03/10/2010 Document Revised: 05/21/2016 Document Reviewed:  01/30/2015 Elsevier Interactive Patient Education  [employer].

## 2020-01-29 HISTORY — PX: CYST EXCISION: SHX5701

## 2020-01-30 ENCOUNTER — Other Ambulatory Visit: Payer: Self-pay

## 2020-01-30 ENCOUNTER — Ambulatory Visit (INDEPENDENT_AMBULATORY_CARE_PROVIDER_SITE_OTHER): Payer: Self-pay | Admitting: General Surgery

## 2020-01-30 ENCOUNTER — Encounter: Payer: Self-pay | Admitting: General Surgery

## 2020-01-30 VITALS — BP 132/79 | HR 67 | Temp 97.7°F | Resp 15 | Ht 68.5 in | Wt 159.6 lb

## 2020-01-30 DIAGNOSIS — L723 Sebaceous cyst: Secondary | ICD-10-CM | POA: Insufficient documentation

## 2020-01-30 HISTORY — DX: Sebaceous cyst: L72.3

## 2020-01-30 NOTE — Progress Notes (Signed)
Keith Jordan is here today for a wound check.  He had an infected sebaceous cyst that I excised last week in clinic.  Due to the infection, the wound was closed loosely with nylon suture.  He is here to have those removed today.  He says the area is still somewhat tender, particularly when he sits on it.  There has been a small amount of serosanguineous drainage on the gauze dressing.  He still has a few days left of Keflex.  Cultures taken at the time of the excision were negative for any aerobic organisms, but the anaerobic culture is still pending.  He denies any fevers or chills.  No nausea or vomiting.  Today's Vitals   01/30/20 1000  BP: 132/79  Pulse: 67  Resp: 15  Temp: 97.7 F (36.5 C)  SpO2: 100%  Weight: 159 lb 9.6 oz (72.4 kg)  Height: 5' 8.5" (1.74 m)   Body mass index is 23.91 kg/m. Focused examination of the surgical site: The gauze dressing was removed.  There is no purulent or serosanguineous drainage present.  The sutures were intact.  The area surrounding them is bruised.  The sutures were removed and the skin is well approximated.  Steri-Strips were applied, followed by a gauze dressing.  Impression and plan: This is a 79 year old man who had an infected sebaceous cyst on his right buttock.  I suspect that there may be a small hematoma beneath the surface and reassured [name] that even if it self-expresses, there is nothing to worry about.  I anticipate that the bruising and swelling will continue to resolve with time.  He should complete his course of antibiotics.  He may shower normally and allow the Steri-Strips to fall off on their own.  If things are not improving in 2 to 3 weeks, he was instructed to contact our office for recheck.  At this time, I plan to see him on an as-needed basis.

## 2020-01-30 NOTE — Patient Instructions (Signed)
We have placed steri strips on the wound. These will start to fall of on their own in 1-2 weeks. You may shower. Just do not scrub the area. Finish all of your antibiotics.   Call if not improved in about 2 weeks  Follow-up with our office as needed.  Please call and ask to speak with a nurse if you develop questions or concerns.

## 2020-02-01 DIAGNOSIS — R69 Illness, unspecified: Secondary | ICD-10-CM | POA: Diagnosis not present

## 2020-02-01 LAB — ANAEROBIC AND AEROBIC CULTURE: Result 2: NEGATIVE — AB

## 2020-03-11 DIAGNOSIS — Z0100 Encounter for examination of eyes and vision without abnormal findings: Secondary | ICD-10-CM | POA: Diagnosis not present

## 2020-05-01 DIAGNOSIS — H353132 Nonexudative age-related macular degeneration, bilateral, intermediate dry stage: Secondary | ICD-10-CM | POA: Diagnosis not present

## 2020-07-18 DIAGNOSIS — M25561 Pain in right knee: Secondary | ICD-10-CM | POA: Diagnosis not present

## 2020-07-18 DIAGNOSIS — M1711 Unilateral primary osteoarthritis, right knee: Secondary | ICD-10-CM | POA: Diagnosis not present

## 2020-08-01 DIAGNOSIS — R69 Illness, unspecified: Secondary | ICD-10-CM | POA: Diagnosis not present

## 2020-08-09 DIAGNOSIS — M1711 Unilateral primary osteoarthritis, right knee: Secondary | ICD-10-CM | POA: Diagnosis not present

## 2020-08-09 DIAGNOSIS — M25561 Pain in right knee: Secondary | ICD-10-CM | POA: Diagnosis not present

## 2020-08-15 DIAGNOSIS — M1711 Unilateral primary osteoarthritis, right knee: Secondary | ICD-10-CM | POA: Diagnosis not present

## 2020-08-15 DIAGNOSIS — M25561 Pain in right knee: Secondary | ICD-10-CM | POA: Diagnosis not present

## 2020-08-22 DIAGNOSIS — M1711 Unilateral primary osteoarthritis, right knee: Secondary | ICD-10-CM | POA: Diagnosis not present

## 2020-08-22 DIAGNOSIS — M25561 Pain in right knee: Secondary | ICD-10-CM | POA: Diagnosis not present

## 2020-08-29 DIAGNOSIS — M25561 Pain in right knee: Secondary | ICD-10-CM | POA: Diagnosis not present

## 2020-08-29 DIAGNOSIS — M1711 Unilateral primary osteoarthritis, right knee: Secondary | ICD-10-CM | POA: Diagnosis not present

## 2020-10-02 NOTE — Progress Notes (Signed)
    Keith Jordan T. Danaye Sobh, MD, [place]  Primary Care and Sports Medicine [facility] at [facility] [place], [address]  Phone: [phone]  FAX: [phone]  Keith Jordan - 79 y.o. male  MRN [id number]  Date of Birth: 08-23-1941  Date: 10/03/2020  PCP: [name], MD  Referral: [name], MD  Chief Complaint  Patient presents with  . Ankle Pain    Left-Dropped wrench on ankle last week    This visit occurred during the SARS-CoV-2 public health emergency.  Safety protocols were in place, including screening questions prior to the visit, additional usage of staff PPE, and extensive cleaning of exam room while observing appropriate contact time as indicated for disinfecting solutions.   Subjective:   Keith Jordan is a 79 y.o. very pleasant male patient with Body mass index is 22.89 kg/m. who presents with the following:  He is a very nice young 79 year old gentleman and he presents today with some left ankle pain.  About 1 week ago he dropped a wrench on his medial ankle and since then he has had some swelling and pain near the medial malleolus.  He is able to walk without much of a significant limp or pain.  He has had to take some Tylenol a few times, but that is it.  He does not have any pain in the midfoot or forefoot.  No significant pain at the toes or proximal to the ankle.  Dropped a wrench on it.  Review of Systems is noted in the HPI, as appropriate   Objective:   BP 130/70   Pulse 68   Temp 97.8 F (36.6 C) (Temporal)   Ht 5' 8.5" (1.74 m)   Wt 152 lb 12 oz (69.3 kg)   SpO2 99%   BMI 22.89 kg/m   Patient has full range of motion at the ankle as well as in the forefoot.  Nontender throughout the entirety of the forefoot and midfoot.  Calcaneus is nontender.  No tenderness at the ATFL, CFL, or deltoid ligaments.  Drawer testing is intact.  He does have a palpable hematoma on the medial aspect of  the lower extremity, and this is fairly adjacent to the medial malleolus.  The lateral malleolus and every other piece of anatomy that there is right there is entirely nontender.  Radiology: No results found.  Assessment and Plan:     ICD-10-CM   1. Acute left ankle pain  M25.572 DG Ankle Complete Left   The patient's plain film is entirely normal without any evidence of fracture or dislocation. We reviewed these face-to-face in the office.  Hematoma medial ankle. Light compression with continue range of motion walking. This should ultimately do well.  No orders of the defined types were placed in this encounter.  There are no discontinued medications. Orders Placed This Encounter  Procedures  . DG Ankle Complete Left    Follow-up: No follow-ups on file.  Signed,  Maud Deed. Eveleigh Crumpler, MD   Outpatient Encounter Medications as of 10/03/2020  Medication Sig  . aspirin EC 81 MG tablet Take 81 mg by mouth daily.  Marland Kitchen atorvastatin (LIPITOR) 10 MG tablet TAKE 1 TABLET BY MOUTH EVERY DAY  . Multiple Vitamins-Minerals (PRESERVISION AREDS) CAPS daily.   No facility-administered encounter medications on file as of 10/03/2020.

## 2020-10-03 ENCOUNTER — Ambulatory Visit (INDEPENDENT_AMBULATORY_CARE_PROVIDER_SITE_OTHER): Payer: Medicare HMO | Admitting: Family Medicine

## 2020-10-03 ENCOUNTER — Other Ambulatory Visit: Payer: Self-pay | Admitting: Family Medicine

## 2020-10-03 ENCOUNTER — Encounter: Payer: Self-pay | Admitting: Family Medicine

## 2020-10-03 ENCOUNTER — Other Ambulatory Visit: Payer: Self-pay

## 2020-10-03 ENCOUNTER — Ambulatory Visit (INDEPENDENT_AMBULATORY_CARE_PROVIDER_SITE_OTHER)
Admission: RE | Admit: 2020-10-03 | Discharge: 2020-10-03 | Disposition: A | Discharge status: Home / Self Care | Payer: Medicare HMO | Source: Ambulatory Visit | Attending: Family Medicine | Admitting: Family Medicine

## 2020-10-03 ENCOUNTER — Other Ambulatory Visit (INDEPENDENT_AMBULATORY_CARE_PROVIDER_SITE_OTHER): Payer: Medicare HMO

## 2020-10-03 VITALS — BP 130/70 | HR 68 | Temp 97.8°F | Ht 68.5 in | Wt 152.8 lb

## 2020-10-03 DIAGNOSIS — E785 Hyperlipidemia, unspecified: Secondary | ICD-10-CM | POA: Diagnosis not present

## 2020-10-03 DIAGNOSIS — R7303 Prediabetes: Secondary | ICD-10-CM

## 2020-10-03 DIAGNOSIS — M25572 Pain in left ankle and joints of left foot: Secondary | ICD-10-CM

## 2020-10-03 DIAGNOSIS — N401 Enlarged prostate with lower urinary tract symptoms: Secondary | ICD-10-CM

## 2020-10-03 DIAGNOSIS — M7989 Other specified soft tissue disorders: Secondary | ICD-10-CM | POA: Diagnosis not present

## 2020-10-03 DIAGNOSIS — R351 Nocturia: Secondary | ICD-10-CM

## 2020-10-03 LAB — COMPREHENSIVE METABOLIC PANEL
ALT: 10 U/L (ref 0–53)
AST: 22 U/L (ref 0–37)
Albumin: 3.9 g/dL (ref 3.5–5.2)
Alkaline Phosphatase: 81 U/L (ref 39–117)
BUN: 24 mg/dL — ABNORMAL HIGH (ref 6–23)
CO2: 34 mEq/L — ABNORMAL HIGH (ref 19–32)
Calcium: 9.1 mg/dL (ref 8.4–10.5)
Chloride: 102 mEq/L (ref 96–112)
Creatinine, Ser: 1.1 mg/dL (ref 0.40–1.50)
GFR: 63.71 mL/min (ref >60.00)
Glucose, Bld: 110 mg/dL — ABNORMAL HIGH (ref 70–99)
Potassium: 4.3 mEq/L (ref 3.5–5.1)
Sodium: 143 mEq/L (ref 135–145)
Total Bilirubin: 0.8 mg/dL (ref 0.2–1.2)
Total Protein: 6.3 g/dL (ref 6.0–8.3)

## 2020-10-03 LAB — LIPID PANEL
Cholesterol: 120 mg/dL (ref 0–200)
HDL: 27.9 mg/dL — ABNORMAL LOW (ref >39.00)
LDL Cholesterol: 71 mg/dL (ref 0–99)
NonHDL: 91.81
Total CHOL/HDL Ratio: 4
Triglycerides: 106 mg/dL (ref 0.0–149.0)
VLDL: 21.2 mg/dL (ref 0.0–40.0)

## 2020-10-03 LAB — PSA: PSA: 3.01 ng/mL (ref 0.10–4.00)

## 2020-10-03 LAB — HEMOGLOBIN A1C: Hgb A1c MFr Bld: 6.3 % (ref 4.6–6.5)

## 2020-10-04 ENCOUNTER — Ambulatory Visit (INDEPENDENT_AMBULATORY_CARE_PROVIDER_SITE_OTHER): Payer: Medicare HMO

## 2020-10-04 DIAGNOSIS — Z0000 Encounter for general adult medical examination without abnormal findings: Secondary | ICD-10-CM | POA: Diagnosis not present

## 2020-10-04 NOTE — Progress Notes (Signed)
PCP notes:  Health Maintenance: Flu- due   Abnormal Screenings: none   Patient concerns: none   Nurse concerns: none   Next PCP appt.: 10/11/2020 @ 10:30 am

## 2020-10-04 NOTE — Progress Notes (Signed)
Subjective:   Keith Jordan is a 79 y.o. male who presents for Medicare Annual/Subsequent preventive examination.  Review of Systems: N/A      I connected with the patient today by telephone and verified that I am speaking with the correct person using two identifiers. Location patient: home Location nurse: work Persons participating in the telephone visit: patient, nurse.   I discussed the limitations, risks, security and privacy concerns of performing an evaluation and management service by telephone and the availability of in person appointments. I also discussed with the patient that there may be a patient responsible charge related to this service. The patient expressed understanding and verbally consented to this telephonic visit.        Cardiac Risk Factors include: advanced age (>64men, >28 women);male gender;Other (see comment), Risk factor comments: hyperlipidemia     Objective:    Today's Vitals   There is no height or weight on file to calculate BMI.  Advanced Directives 10/04/2020 09/26/2018 09/14/2017 09/11/2016  Does Patient Have a Medical Advance Directive? Yes Yes Yes Yes  Type of Paramedic of [place];Living will [place];Living will [place];Living will [place];Living will  Does patient want to make changes to medical advance directive? - - - No - Patient declined  Copy of [place] in Chart? No - copy requested No - copy requested No - copy requested No - copy requested    Current Medications (verified) Outpatient Encounter Medications as of 10/04/2020  Medication Sig  . aspirin EC 81 MG tablet Take 81 mg by mouth daily.  Marland Kitchen atorvastatin (LIPITOR) 10 MG tablet TAKE 1 TABLET BY MOUTH EVERY DAY  . Multiple Vitamins-Minerals (PRESERVISION AREDS) CAPS daily.   No facility-administered encounter medications on file as of 10/04/2020.    Allergies  (verified) Patient has no known allergies.   History: Past Medical History:  Diagnosis Date  . Hyperlipidemia   . Ischemic optic neuropathy 2006   [place] at [facility]  . Personal history of colonic adenoma 07/30/2008  . Prediabetes 07/05/2016   A1c 6.5% 03/2014   . Primary osteoarthritis of knee    bilateral s/p L knee replacement, receives R knee injections Francis Gaines)   Past Surgical History:  Procedure Laterality Date  . COLONOSCOPY  12/2013   no polyps, no rpt due [name])  . CYST EXCISION  01/2020  . TOTAL KNEE ARTHROPLASTY Left 2005   [name]   Family History  Problem Relation Age of Onset  . Blindness Mother 16       ?stroke in eyes  . Lung disease Father        Ecologist  . Arthritis Father   . Colon cancer Neg Hx   . Pancreatic cancer Neg Hx   . Stomach cancer Neg Hx   . Esophageal cancer Neg Hx   . Rectal cancer Neg Hx   . Cancer Neg Hx   . Diabetes Neg Hx   . CAD Neg Hx   . Stroke Neg Hx    Social History   Socioeconomic History  . Marital status: Married    Spouse name: Not on file  . Number of children: Not on file  . Years of education: Not on file  . Highest education level: Not on file  Occupational History  . Not on file  Tobacco Use  . Smoking status: Never Smoker  . Smokeless tobacco: Never Used  Vaping Use  .  Vaping Use: Never used  Substance and Sexual Activity  . Alcohol use: Yes    Comment: occasional  . Drug use: No  . Sexual activity: Not Currently  Other Topics Concern  . Not on file  Social History Narrative   Lives with wife, 2 cats   Occ: retired Personal assistant   Activity: works in yard   Diet: some water, fruits/vegetables daily   Social Determinants of Radio broadcast assistant Strain: [place]   . Difficulty of Paying Living Expenses: Not hard at all  Food Insecurity: No Food Insecurity  . Worried About Charity fundraiser in the Last Year: Never true  . Ran Out of Food in the Last Year: Never true   Transportation Needs: No Transportation Needs  . Lack of Transportation (Medical): No  . Lack of Transportation (Non-Medical): No  Physical Activity: Inactive  . Days of Exercise per Week: 0 days  . Minutes of Exercise per Session: 0 min  Stress: No Stress Concern Present  . Feeling of Stress : Not at all  Social Connections:   . Frequency of Communication with Friends and Family: Not on file  . Frequency of Social Gatherings with Friends and Family: Not on file  . Attends Religious Services: Not on file  . Active Member of Clubs or Organizations: Not on file  . Attends Archivist Meetings: Not on file  . Marital Status: Not on file    Tobacco Counseling Counseling given: Not Answered   Clinical Intake:  Pre-visit preparation completed: Yes  Pain : 0-10 Pain Type: Acute pain Pain Location: Ankle Pain Orientation: Left Pain Descriptors / Indicators: Aching Pain Onset: 1 to 4 weeks ago Pain Frequency: Intermittent     Nutritional Risks: None Diabetes: No  How often do you need to have someone help you when you read instructions, pamphlets, or other written materials from your doctor or pharmacy?: 1 - Never What is the last grade level you completed in school?: 12th  Diabetic: No Nutrition Risk Assessment:  Has the patient had any N/V/D within the last 2 months?  No  Does the patient have any non-healing wounds?  No  Has the patient had any unintentional weight loss or weight gain?  No   Diabetes:  Is the patient diabetic?  No  If diabetic, was a CBG obtained today?  N/A Did the patient bring in their glucometer from home?  N/A How often do you monitor your CBG's? N/A.   Financial Strains and Diabetes Management:  Are you having any financial strains with the device, your supplies or your medication? N/A.  Does the patient want to be seen by Chronic Care Management for management of their diabetes?  N/A Would the patient like to be referred to a  Nutritionist or for Diabetic Management?  N/A   Interpreter Needed?: No  Information entered by :: CJohnson, LPN   Activities of Daily Living In your present state of health, do you have any difficulty performing the following activities: 10/04/2020  Hearing? Y  Comment wears hearing aids to watch TV  Vision? N  Difficulty concentrating or making decisions? N  Walking or climbing stairs? N  Dressing or bathing? N  Doing errands, shopping? N  Preparing Food and eating ? N  Using the Toilet? N  In the past six months, have you accidently leaked urine? Y  Do you have problems with loss of bowel control? N  Managing your Medications? N  Managing your Finances?  N  Housekeeping or managing your Housekeeping? N  Some recent data might be hidden    Patient Care Team: [name], MD as PCP - General (Family Medicine) Specialists, [place] as Consulting Physician (Orthopedic Surgery)  Indicate any recent Medical Services you may have received from other than [facility] providers in the past year (date may be approximate).     Assessment:   This is a routine wellness examination for [place].  Hearing/Vision screen  Hearing Screening   125Hz  250Hz  500Hz  1000Hz  2000Hz  3000Hz  4000Hz  6000Hz  8000Hz   Right ear:           Left ear:           Vision Screening Comments: Patient gets annual eye exams   Dietary issues and exercise activities discussed: Current Exercise Habits: The patient does not participate in regular exercise at present, Exercise limited by: None identified  Goals    . Increase physical activity     Starting 09/26/2018, I will continue to exercise for at least 30 min 3 days per week.     . Patient Stated     10/04/2020, I will maintain and continue medications as prescribed.       Depression Screen PHQ 2/9 Scores 10/04/2020 10/04/2019 09/26/2018 09/14/2017 09/11/2016  PHQ - 2 Score 0 0 0 0 0  PHQ- 9 Score 0 - 0 1 -    Fall Risk Fall Risk  10/04/2020  01/23/2020 10/04/2019 09/26/2018 09/14/2017  Falls in the past year? 0 0 0 No No  Number falls in past yr: 0 0 - - -  Injury with Fall? 0 0 - - -  Risk for fall due to : Medication side effect - - - -  Follow up Falls evaluation completed;Falls prevention discussed - - - -    Any stairs in or around the home? Yes  If so, are there any without handrails? No  Home free of loose throw rugs in walkways, pet beds, electrical cords, etc? Yes  Adequate lighting in your home to reduce risk of falls? Yes   ASSISTIVE DEVICES UTILIZED TO PREVENT FALLS:  Life alert? No  Use of a cane, walker or w/c? No  Grab bars in the bathroom? No  Shower chair or bench in shower? No  Elevated toilet seat or a handicapped toilet? No   TIMED UP AND GO:  Was the test performed? N/A, telephonic visit .    Cognitive Function: MMSE - Mini Mental State Exam 10/04/2020 09/26/2018 09/14/2017 09/11/2016  Orientation to time 5 5 5 5   Orientation to Place 5 5 5 5   Registration 3 3 3 3   Attention/ Calculation 5 0 0 0  Recall 3 3 3 3   Language- name 2 objects - 0 0 0  Language- repeat 1 1 1 1   Language- follow 3 step command - 3 3 3   Language- read & follow direction - 0 0 0  Write a sentence - 0 0 0  Copy design - 0 0 0  Total score - 20 20 20   Mini Cog  Mini-Cog screen was completed. Maximum score is 22. A value of 0 denotes this part of the MMSE was not completed or the patient failed this part of the Mini-Cog screening.       Immunizations Immunization History  Administered Date(s) Administered  . Fluad Quad(high Dose 65+) 10/04/2019  . Influenza Split 10/30/2013  . Influenza,inj,Quad PF,6+ Mos 09/14/2017, 09/26/2018  . Influenza-Unspecified 09/01/2016  . PFIZER SARS-COV-2 Vaccination 02/03/2020,  02/24/2020  . Pneumococcal Conjugate-13 09/08/2016  . Pneumococcal Polysaccharide-23 09/14/2017    TDAP status: Due, Education has been provided regarding the importance of this vaccine. Advised may receive  this vaccine at local pharmacy or Health Dept. Aware to provide a copy of the vaccination record if obtained from local pharmacy or Health Dept. Verbalized acceptance and understanding. Flu Vaccine status: due, will get at upcoming physical Pneumococcal vaccine status: Up to date Covid-19 vaccine status: Completed vaccines  Qualifies for Shingles Vaccine? Yes   Zostavax completed No   Shingrix Completed?: No.    Education has been provided regarding the importance of this vaccine. Patient has been advised to call insurance company to determine out of pocket expense if they have not yet received this vaccine. Advised may also receive vaccine at local pharmacy or Health Dept. Verbalized acceptance and understanding.  Screening Tests Health Maintenance  Topic Date Due  . Hepatitis C Screening  Never done  . URINE MICROALBUMIN  09/27/2019  . INFLUENZA VACCINE  07/28/2020  . TETANUS/TDAP  09/15/2027 (Originally 03/17/1960)  . COVID-19 Vaccine  Completed  . PNA vac Low Risk Adult  Completed    Health Maintenance  Health Maintenance Due  Topic Date Due  . Hepatitis C Screening  Never done  . URINE MICROALBUMIN  09/27/2019  . INFLUENZA VACCINE  07/28/2020    Colorectal cancer screening: No longer required.   Lung Cancer Screening: (Low Dose CT Chest recommended if Age 47-80 years, 30 pack-year currently smoking OR have quit w/in 15 years.) does not qualify.    Additional Screening:  Hepatitis C Screening: does qualify; Completed due  Vision Screening: Recommended annual ophthalmology exams for early detection of glaucoma and other disorders of the eye. Is the patient up to date with their annual eye exam?  Yes  Who is the provider or what is the name of the office in which the patient attends annual eye exams? Dr. Edison Pace, [facility] If pt is not established with a provider, would they like to be referred to a provider to establish care? No .   Dental Screening: Recommended  annual dental exams for proper oral hygiene  Community Resource Referral / Chronic Care Management: CRR required this visit?  No   CCM required this visit?  No      Plan:     I have personally reviewed and noted the following in the patient's chart:   . Medical and social history . Use of alcohol, tobacco or illicit drugs  . Current medications and supplements . Functional ability and status . Nutritional status . Physical activity . Advanced directives . List of other physicians . Hospitalizations, surgeries, and ER visits in previous 12 months . Vitals . Screenings to include cognitive, depression, and falls . Referrals and appointments  In addition, I have reviewed and discussed with patient certain preventive protocols, quality metrics, and best practice recommendations. A written personalized care plan for preventive services as well as general preventive health recommendations were provided to patient.   Due to this being a telephonic visit, the after visit summary with patients personalized plan was offered to patient via office or my-chart. Patient preferred to pick up at office at next visit or via mychart.   Andrez Grime, LPN   27/0/3500

## 2020-10-04 NOTE — Patient Instructions (Signed)
Mr. Keith Jordan , Thank you for taking time to come for your Medicare Wellness Visit. I appreciate your ongoing commitment to your health goals. Please review the following plan we discussed and let me know if I can assist you in the future.   Screening recommendations/referrals: Colonoscopy: no longer required Recommended yearly ophthalmology/optometry visit for glaucoma screening and checkup Recommended yearly dental visit for hygiene and checkup  Vaccinations: Influenza vaccine: due, will get at upcoming physical  Pneumococcal vaccine: Completed series Tdap vaccine: decline (insurance) Shingles vaccine: due, check with your insurance regarding coverage if interested   Covid-19: Completed series  Advanced directives: Please bring a copy of your POA (Power of [place]) and/or Living Will to your next appointment.  Conditions/risks identified: hyperlipidemia  Next appointment: Follow up in one year for your annual wellness visit.   Preventive Care 15 Years and Older, Male Preventive care refers to lifestyle choices and visits with your health care provider that can promote health and wellness. What does preventive care include?  A yearly physical exam. This is also called an annual well check.  Dental exams once or twice a year.  Routine eye exams. Ask your health care provider how often you should have your eyes checked.  Personal lifestyle choices, including:  Daily care of your teeth and gums.  Regular physical activity.  Eating a healthy diet.  Avoiding tobacco and drug use.  Limiting alcohol use.  Practicing safe sex.  Taking low doses of aspirin every day.  Taking vitamin and mineral supplements as recommended by your health care provider. What happens during an annual well check? The services and screenings done by your health care provider during your annual well check will depend on your age, overall health, lifestyle risk factors, and family history of  disease. Counseling  Your health care provider may ask you questions about your:  Alcohol use.  Tobacco use.  Drug use.  Emotional well-being.  Home and relationship well-being.  Sexual activity.  Eating habits.  History of falls.  Memory and ability to understand (cognition).  Work and work Statistician. Screening  You may have the following tests or measurements:  Height, weight, and BMI.  Blood pressure.  Lipid and cholesterol levels. These may be checked every 5 years, or more frequently if you are over 74 years old.  Skin check.  Lung cancer screening. You may have this screening every year starting at age 75 if you have a 30-pack-year history of smoking and currently smoke or have quit within the past 15 years.  Fecal occult blood test (FOBT) of the stool. You may have this test every year starting at age 70.  Flexible sigmoidoscopy or colonoscopy. You may have a sigmoidoscopy every 5 years or a colonoscopy every 10 years starting at age 108.  Prostate cancer screening. Recommendations will vary depending on your family history and other risks.  Hepatitis C blood test.  Hepatitis B blood test.  Sexually transmitted disease (STD) testing.  Diabetes screening. This is done by checking your blood sugar (glucose) after you have not eaten for a while (fasting). You may have this done every 1-3 years.  Abdominal aortic aneurysm (AAA) screening. You may need this if you are a current or former smoker.  Osteoporosis. You may be screened starting at age 31 if you are at high risk. Talk with your health care provider about your test results, treatment options, and if necessary, the need for more tests. Vaccines  Your health care provider may recommend certain  vaccines, such as:  Influenza vaccine. This is recommended every year.  Tetanus, diphtheria, and acellular pertussis (Tdap, Td) vaccine. You may need a Td booster every 10 years.  Zoster vaccine. You may  need this after age 63.  Pneumococcal 13-valent conjugate (PCV13) vaccine. One dose is recommended after age 25.  Pneumococcal polysaccharide (PPSV23) vaccine. One dose is recommended after age 22. Talk to your health care provider about which screenings and vaccines you need and how often you need them. This information is not intended to replace advice given to you by your health care provider. Make sure you discuss any questions you have with your health care provider. Document Released: 01/10/2016 Document Revised: 09/02/2016 Document Reviewed: 10/15/2015 Elsevier Interactive Patient Education  2017 [employer] Prevention in the Home Falls can cause injuries. They can happen to people of all ages. There are many things you can do to make your home safe and to help prevent falls. What can I do on the outside of my home?  Regularly fix the edges of walkways and driveways and fix any cracks.  Remove anything that might make you trip as you walk through a door, such as a raised step or threshold.  Trim any bushes or trees on the path to your home.  Use bright outdoor lighting.  Clear any walking paths of anything that might make someone trip, such as rocks or tools.  Regularly check to see if handrails are loose or broken. Make sure that both sides of any steps have handrails.  Any raised decks and porches should have guardrails on the edges.  Have any leaves, snow, or ice cleared regularly.  Use sand or salt on walking paths during winter.  Clean up any spills in your garage right away. This includes oil or grease spills. What can I do in the bathroom?  Use night lights.  Install grab bars by the toilet and in the tub and shower. Do not use towel bars as grab bars.  Use non-skid mats or decals in the tub or shower.  If you need to sit down in the shower, use a plastic, non-slip stool.  Keep the floor dry. Clean up any water that spills on the floor as soon as it  happens.  Remove soap buildup in the tub or shower regularly.  Attach bath mats securely with double-sided non-slip rug tape.  Do not have throw rugs and other things on the floor that can make you trip. What can I do in the bedroom?  Use night lights.  Make sure that you have a light by your bed that is easy to reach.  Do not use any sheets or blankets that are too big for your bed. They should not hang down onto the floor.  Have a firm chair that has side arms. You can use this for support while you get dressed.  Do not have throw rugs and other things on the floor that can make you trip. What can I do in the kitchen?  Clean up any spills right away.  Avoid walking on wet floors.  Keep items that you use a lot in easy-to-reach places.  If you need to reach something above you, use a strong step stool that has a grab bar.  Keep electrical cords out of the way.  Do not use floor polish or wax that makes floors slippery. If you must use wax, use non-skid floor wax.  Do not have throw rugs and other things  on the floor that can make you trip. What can I do with my stairs?  Do not leave any items on the stairs.  Make sure that there are handrails on both sides of the stairs and use them. Fix handrails that are broken or loose. Make sure that handrails are as long as the stairways.  Check any carpeting to make sure that it is firmly attached to the stairs. Fix any carpet that is loose or worn.  Avoid having throw rugs at the top or bottom of the stairs. If you do have throw rugs, attach them to the floor with carpet tape.  Make sure that you have a light switch at the top of the stairs and the bottom of the stairs. If you do not have them, ask someone to add them for you. What else can I do to help prevent falls?  Wear shoes that:  Do not have high heels.  Have rubber bottoms.  Are comfortable and fit you well.  Are closed at the toe. Do not wear sandals.  If you  use a stepladder:  Make sure that it is fully opened. Do not climb a closed stepladder.  Make sure that both sides of the stepladder are locked into place.  Ask someone to hold it for you, if possible.  Clearly mark and make sure that you can see:  Any grab bars or handrails.  First and last steps.  Where the edge of each step is.  Use tools that help you move around (mobility aids) if they are needed. These include:  Canes.  Walkers.  Scooters.  Crutches.  Turn on the lights when you go into a dark area. Replace any light bulbs as soon as they burn out.  Set up your furniture so you have a clear path. Avoid moving your furniture around.  If any of your floors are uneven, fix them.  If there are any pets around you, be aware of where they are.  Review your medicines with your doctor. Some medicines can make you feel dizzy. This can increase your chance of falling. Ask your doctor what other things that you can do to help prevent falls. This information is not intended to replace advice given to you by your health care provider. Make sure you discuss any questions you have with your health care provider. Document Released: 10/10/2009 Document Revised: 05/21/2016 Document Reviewed: 01/18/2015 Elsevier Interactive Patient Education  2017 [employer].

## 2020-10-11 ENCOUNTER — Encounter: Payer: Self-pay | Admitting: Family Medicine

## 2020-10-11 ENCOUNTER — Ambulatory Visit (INDEPENDENT_AMBULATORY_CARE_PROVIDER_SITE_OTHER): Payer: Medicare HMO | Admitting: Family Medicine

## 2020-10-11 ENCOUNTER — Other Ambulatory Visit: Payer: Self-pay

## 2020-10-11 ENCOUNTER — Ambulatory Visit (INDEPENDENT_AMBULATORY_CARE_PROVIDER_SITE_OTHER)
Admission: RE | Admit: 2020-10-11 | Discharge: 2020-10-11 | Disposition: A | Discharge status: Home / Self Care | Payer: Medicare HMO | Source: Ambulatory Visit | Attending: Family Medicine | Admitting: Family Medicine

## 2020-10-11 VITALS — BP 128/60 | HR 71 | Temp 97.9°F | Ht 68.0 in | Wt 151.1 lb

## 2020-10-11 DIAGNOSIS — E785 Hyperlipidemia, unspecified: Secondary | ICD-10-CM | POA: Diagnosis not present

## 2020-10-11 DIAGNOSIS — Z0000 Encounter for general adult medical examination without abnormal findings: Secondary | ICD-10-CM | POA: Diagnosis not present

## 2020-10-11 DIAGNOSIS — H47019 Ischemic optic neuropathy, unspecified eye: Secondary | ICD-10-CM | POA: Diagnosis not present

## 2020-10-11 DIAGNOSIS — Z7189 Other specified counseling: Secondary | ICD-10-CM

## 2020-10-11 DIAGNOSIS — Z23 Encounter for immunization: Secondary | ICD-10-CM

## 2020-10-11 DIAGNOSIS — R351 Nocturia: Secondary | ICD-10-CM | POA: Diagnosis not present

## 2020-10-11 DIAGNOSIS — R0989 Other specified symptoms and signs involving the circulatory and respiratory systems: Secondary | ICD-10-CM | POA: Diagnosis not present

## 2020-10-11 DIAGNOSIS — N401 Enlarged prostate with lower urinary tract symptoms: Secondary | ICD-10-CM | POA: Diagnosis not present

## 2020-10-11 DIAGNOSIS — R0689 Other abnormalities of breathing: Secondary | ICD-10-CM

## 2020-10-11 DIAGNOSIS — R7303 Prediabetes: Secondary | ICD-10-CM | POA: Diagnosis not present

## 2020-10-11 HISTORY — DX: Other abnormalities of breathing: R06.89

## 2020-10-11 MED ORDER — PRESERVISION AREDS PO CAPS: 1.0000 | ORAL_CAPSULE | Freq: Two times a day (BID) | ORAL | 3 refills | Status: DC

## 2020-10-11 MED ORDER — ATORVASTATIN CALCIUM 10 MG PO TABS
10.0000 mg | ORAL_TABLET | Freq: Every day | ORAL | 3 refills | Status: DC
Start: 2020-10-11 — End: 2021-10-13

## 2020-10-11 MED ORDER — ASPIRIN EC 81 MG PO TBEC
81.0000 mg | DELAYED_RELEASE_TABLET | ORAL | Status: DC
Start: 2020-10-14 — End: 2021-05-14

## 2020-10-11 NOTE — Assessment & Plan Note (Signed)
Stable period off medication. PSA stable. Discussed stopping PSA screening at this time and just monitoring symptoms.

## 2020-10-11 NOTE — Progress Notes (Signed)
This visit was conducted in person.  BP 128/60 (BP Location: Left Arm, Patient Position: Sitting, Cuff Size: Normal)   Pulse 71   Temp 97.9 F (36.6 C) (Temporal)   Ht 5\' 8"  (1.727 m)   Wt 151 lb 2 oz (68.5 kg)   SpO2 95%   BMI 22.98 kg/m    CC: CPE Subjective:    Patient ID: Keith Jordan, male    DOB: 10-06-1941, 79 y.o.   MRN: [id number]  HPI: Keith Jordan is a 79 y.o. male presenting on 10/11/2020 for Annual Exam (Prt 2. )   Saw health advisor last week for medicare wellness visit. Note reviewed.   No exam data present    Clinical Support from 10/04/2020 in [place] at [facility] Total Score 0      Fall Risk  10/04/2020 01/23/2020 10/04/2019 09/26/2018 09/14/2017  Falls in the past year? 0 0 0 No No  Number falls in past yr: 0 0 - - -  Injury with Fall? 0 0 - - -  Risk for fall due to : Medication side effect - - - -  Follow up Falls evaluation completed;Falls prevention discussed - - - -   Asks about aspirin dosing - on baby aspirin over the last 10 years. No fmhx CAD or stroke. No personal h/o heart attack or stroke.   Asks about podiatry referral. Great toe can lock L>R.  Seen last week by sports medicine after dropping wrench on medial malleolus of left foot with residual swelling/hematoma. Recovering slowly.   Preventative: COLONOSCOPY Date: 12/2013 no polyps, no rpt due [name]) Prostate cancer screening - discussed.Desires continued screening.Nocturia x2-3. Denies trouble with stream. Nocturia x2.  Lung cancer screening - never smoker  Flu shot yearly Tetanus shot unsure Prevnar 2017, pneumovax 2018 [place] 01/2020 x2  Shingrix - discussed - too expensive. He had mild shingles in the past.  Advanced directive discussion - has at home. Son has copy. Son Arlyn Buerkle is HCPOA. Asked to bring copy.  Seat belt use discussed  Sunscreen usediscussed, no changing moles on skin Non smoker  Alcohol use - about once a  week Dentist Q31mo  Eye exam yearly Bowel - no constipation  Bladder - occasional leaking   Lives with wife, 2 cats  Occ: retired Personal assistant  Activity: works in yard, new stationary bicycle Diet: some water,likes flavored sparkling water,fruits/vegetables daily     Relevant past medical, surgical, family and social history reviewed and updated as indicated. Interim medical history since our last visit reviewed. Allergies and medications reviewed and updated. Outpatient Medications Prior to Visit  Medication Sig Dispense Refill  . aspirin EC 81 MG tablet Take 81 mg by mouth daily.    Marland Kitchen atorvastatin (LIPITOR) 10 MG tablet TAKE 1 TABLET BY MOUTH EVERY DAY 90 tablet 3  . Multiple Vitamins-Minerals (PRESERVISION AREDS) CAPS daily.     No facility-administered medications prior to visit.     Per HPI unless specifically indicated in ROS section below Review of Systems  Constitutional: Negative for activity change, appetite change, chills, fatigue, fever and unexpected weight change.  HENT: Negative for hearing loss.   Eyes: Negative for visual disturbance.  Respiratory: Positive for cough (coughs up mucous). Negative for chest tightness, shortness of breath and wheezing.   Cardiovascular: Negative for chest pain, palpitations and leg swelling.  Gastrointestinal: Negative for abdominal distention, abdominal pain, blood in stool, constipation, diarrhea, nausea and vomiting.  Genitourinary: Negative for  difficulty urinating and hematuria.  Musculoskeletal: Negative for arthralgias, myalgias and neck pain.  Skin: Negative for rash.  Neurological: Negative for dizziness, seizures, syncope and headaches.  Hematological: Negative for adenopathy. Bruises/bleeds easily (?aspirin related).  Psychiatric/Behavioral: Negative for dysphoric mood. The patient is not nervous/anxious.    Objective:  BP 128/60 (BP Location: Left Arm, Patient Position: Sitting, Cuff Size: Normal)   Pulse 71   Temp  97.9 F (36.6 C) (Temporal)   Ht 5\' 8"  (1.727 m)   Wt 151 lb 2 oz (68.5 kg)   SpO2 95%   BMI 22.98 kg/m   Wt Readings from Last 3 Encounters:  10/11/20 151 lb 2 oz (68.5 kg)  10/03/20 152 lb 12 oz (69.3 kg)  01/30/20 159 lb 9.6 oz (72.4 kg)      Physical Exam Vitals and nursing note reviewed.  Constitutional:      General: He is not in acute distress.    Appearance: Normal appearance. He is well-developed. He is not ill-appearing.  HENT:     Head: Normocephalic and atraumatic.     Right Ear: Hearing normal.     Left Ear: Hearing normal.     Ears:     Comments: Wearing hearing aides    Mouth/Throat:     Pharynx: Uvula midline.  Eyes:     General: No scleral icterus.    Extraocular Movements: Extraocular movements intact.     Conjunctiva/sclera: Conjunctivae normal.     Pupils: Pupils are equal, round, and reactive to light.  Cardiovascular:     Rate and Rhythm: Normal rate and regular rhythm.     Pulses: Normal pulses.          Radial pulses are 2+ on the right side and 2+ on the left side.     Heart sounds: Normal heart sounds. No murmur heard.   Pulmonary:     Effort: Pulmonary effort is normal. No respiratory distress.     Breath sounds: No wheezing, rhonchi or rales.     Comments: Coarse crackles throughout lungs Abdominal:     General: Abdomen is flat. Bowel sounds are normal. There is no distension.     Palpations: Abdomen is soft. There is no mass.     Tenderness: There is no abdominal tenderness. There is no guarding or rebound.     Hernia: No hernia is present.  Musculoskeletal:        General: Normal range of motion.     Cervical back: Normal range of motion and neck supple.     Right lower leg: No edema.     Left lower leg: No edema.  Lymphadenopathy:     Cervical: No cervical adenopathy.  Skin:    General: Skin is warm and dry.     Findings: No rash.  Neurological:     General: No focal deficit present.     Mental Status: He is alert and oriented  to person, place, and time.     Comments: CN grossly intact, station and gait intact  Psychiatric:        Mood and Affect: Mood normal.        Behavior: Behavior normal.        Thought Content: Thought content normal.        Judgment: Judgment normal.       Results for orders placed or performed in visit on 10/03/20  PSA  Result Value Ref Range   PSA 3.01 0.10 - 4.00 ng/mL  Hemoglobin A1c  Result Value Ref Range   Hgb A1c MFr Bld 6.3 4.6 - 6.5 %  Comprehensive metabolic panel  Result Value Ref Range   Sodium 143 135 - 145 mEq/L   Potassium 4.3 3.5 - 5.1 mEq/L   Chloride 102 96 - 112 mEq/L   CO2 34 (H) 19 - 32 mEq/L   Glucose, Bld 110 (H) 70 - 99 mg/dL   BUN 24 (H) 6 - 23 mg/dL   Creatinine, Ser 1.10 0.40 - 1.50 mg/dL   Total Bilirubin 0.8 0.2 - 1.2 mg/dL   Alkaline Phosphatase 81 39 - 117 U/L   AST 22 0 - 37 U/L   ALT 10 0 - 53 U/L   Total Protein 6.3 6.0 - 8.3 g/dL   Albumin 3.9 3.5 - 5.2 g/dL   GFR 63.71 >60.00 mL/min   Calcium 9.1 8.4 - 10.5 mg/dL  Lipid panel  Result Value Ref Range   Cholesterol 120 0 - 200 mg/dL   Triglycerides 106.0 0 - 149 mg/dL   HDL 27.90 (L) >39.00 mg/dL   VLDL 21.2 0.0 - 40.0 mg/dL   LDL Cholesterol 71 0 - 99 mg/dL   Total CHOL/HDL Ratio 4    NonHDL 91.81    Assessment & Plan:  This visit occurred during the SARS-CoV-2 public health emergency.  Safety protocols were in place, including screening questions prior to the visit, additional usage of staff PPE, and extensive cleaning of exam room while observing appropriate contact time as indicated for disinfecting solutions.   Problem List Items Addressed This Visit    Prediabetes    Encouraged limiting added sugars and sweetened beverages.       Ischemic optic neuropathy    Continues seeing eye doctor.       Hyperlipidemia    Chronic, stable on low dose lipitor - continue. The ASCVD Risk score Mikey Bussing DC Jr., et al., 2013) failed to calculate for the following reasons:   The valid  total cholesterol range is 130 to 320 mg/dL       Relevant Medications   aspirin EC 81 MG tablet (Start on 10/14/2020)   atorvastatin (LIPITOR) 10 MG tablet   Health maintenance examination - Primary    Preventative protocols reviewed and updated unless pt declined. Discussed healthy diet and lifestyle.       BPH (benign prostatic hyperplasia)    Stable period off medication. PSA stable. Discussed stopping PSA screening at this time and just monitoring symptoms.       Advanced care planning/counseling discussion    Advanced directive discussion - has at home. Son has copy. Son Torin Modica is HCPOA. Asked to bring copy.       Abnormal breath sounds    Lung exam suspicious for ILD - check CXR. Pt largely asxs, endorses intermittent productive cough.       Relevant Orders   DG Chest 2 View    Other Visit Diagnoses    Need for influenza vaccination       Relevant Orders   Flu Vaccine QUAD High Dose(Fluad) (Completed)       Meds ordered this encounter  Medications  . aspirin EC 81 MG tablet    Sig: Take 1 tablet (81 mg total) by mouth 2 (two) times a week.  Marland Kitchen atorvastatin (LIPITOR) 10 MG tablet    Sig: Take 1 tablet (10 mg total) by mouth daily.    Dispense:  90 tablet    Refill:  3  . Multiple Vitamins-Minerals (PRESERVISION AREDS)  CAPS    Sig: Take 1 capsule by mouth in the morning and at bedtime.    Dispense:  180 capsule    Refill:  3   Orders Placed This Encounter  Procedures  . DG Chest 2 View    Standing Status:   Future    Number of Occurrences:   1    Standing Expiration Date:   10/11/2021    Order Specific Question:   Reason for Exam (SYMPTOM  OR DIAGNOSIS REQUIRED)    Answer:   coarse breath sounds throughout eval for ILD    Order Specific Question:   Preferred imaging location?    Answer:   [facility]  . Flu Vaccine QUAD High Dose(Fluad)    Patient instructions: Flu shot today  Bring [place] copy of your living will.  Chest xray today. Let me  know if any new breathing symptoms develop.  You are doing well today. Return as needed or in 1 year for next physical.   Follow up plan: Return in about 1 year (around 10/11/2021) for annual exam, prior fasting for blood work, medicare wellness visit.  [name], MD

## 2020-10-11 NOTE — Assessment & Plan Note (Signed)
Encouraged limiting added sugars and sweetened beverages 

## 2020-10-11 NOTE — Progress Notes (Signed)
Refill left on vm at pharmacy.  

## 2020-10-11 NOTE — Patient Instructions (Addendum)
Flu shot today  Bring [place] copy of your living will.  Chest xray today. Let me know if any new breathing symptoms develop.  You are doing well today. Return as needed or in 1 year for next physical.   Health Maintenance After Age 78 After age 14, you are at a higher risk for certain long-term diseases and infections as well as injuries from falls. Falls are a major cause of broken bones and head injuries in people who are older than age 17. Getting regular preventive care can help to keep you healthy and well. Preventive care includes getting regular testing and making lifestyle changes as recommended by your health care provider. Talk with your health care provider about:  Which screenings and tests you should have. A screening is a test that checks for a disease when you have no symptoms.  A diet and exercise plan that is right for you. What should I know about screenings and tests to prevent falls? Screening and testing are the best ways to find a health problem early. Early diagnosis and treatment give you the best chance of managing medical conditions that are common after age 54. Certain conditions and lifestyle choices may make you more likely to have a fall. Your health care provider may recommend:  Regular vision checks. Poor vision and conditions such as cataracts can make you more likely to have a fall. If you wear glasses, make sure to get your prescription updated if your vision changes.  Medicine review. Work with your health care provider to regularly review all of the medicines you are taking, including over-the-counter medicines. Ask your health care provider about any side effects that may make you more likely to have a fall. Tell your health care provider if any medicines that you take make you feel dizzy or sleepy.  Osteoporosis screening. Osteoporosis is a condition that causes the bones to get weaker. This can make the bones weak and cause them to break more easily.  Blood  pressure screening. Blood pressure changes and medicines to control blood pressure can make you feel dizzy.  Strength and balance checks. Your health care provider may recommend certain tests to check your strength and balance while standing, walking, or changing positions.  Foot health exam. Foot pain and numbness, as well as not wearing proper footwear, can make you more likely to have a fall.  Depression screening. You may be more likely to have a fall if you have a fear of falling, feel emotionally low, or feel unable to do activities that you used to do.  Alcohol use screening. Using too much alcohol can affect your balance and may make you more likely to have a fall. What actions can I take to lower my risk of falls? General instructions  Talk with your health care provider about your risks for falling. Tell your health care provider if: ? You fall. Be sure to tell your health care provider about all falls, even ones that seem minor. ? You feel dizzy, sleepy, or off-balance.  Take over-the-counter and prescription medicines only as told by your health care provider. These include any supplements.  Eat a healthy diet and maintain a healthy weight. A healthy diet includes low-fat dairy products, low-fat (lean) meats, and fiber from whole grains, beans, and lots of fruits and vegetables. Home safety  Remove any tripping hazards, such as rugs, cords, and clutter.  Install safety equipment such as grab bars in bathrooms and safety rails on stairs.  Keep  rooms and walkways well-lit. Activity   Follow a regular exercise program to stay fit. This will help you maintain your balance. Ask your health care provider what types of exercise are appropriate for you.  If you need a cane or walker, use it as recommended by your health care provider.  Wear supportive shoes that have nonskid soles. Lifestyle  Do not drink alcohol if your health care provider tells you not to drink.  If you  drink alcohol, limit how much you have: ? 0-1 drink a day for women. ? 0-2 drinks a day for men.  Be aware of how much alcohol is in your drink. In the [place] one drink equals one typical bottle of beer (12 oz), one-half glass of wine (5 oz), or one shot of hard liquor (1 oz).  Do not use any products that contain nicotine or tobacco, such as cigarettes and e-cigarettes. If you need help quitting, ask your health care provider. Summary  Having a healthy lifestyle and getting preventive care can help to protect your health and wellness after age 9.  Screening and testing are the best way to find a health problem early and help you avoid having a fall. Early diagnosis and treatment give you the best chance for managing medical conditions that are more common for people who are older than age 31.  Falls are a major cause of broken bones and head injuries in people who are older than age 49. Take precautions to prevent a fall at home.  Work with your health care provider to learn what changes you can make to improve your health and wellness and to prevent falls. This information is not intended to replace advice given to you by your health care provider. Make sure you discuss any questions you have with your health care provider. Document Revised: 04/06/2019 Document Reviewed: 10/27/2017 Elsevier Patient Education  2020 [employer].

## 2020-10-11 NOTE — Assessment & Plan Note (Signed)
Advanced directive discussion - has at home. Son has copy. Son Keith Jordan is HCPOA. Asked to bring copy.

## 2020-10-11 NOTE — Assessment & Plan Note (Signed)
Continues seeing eye doctor.

## 2020-10-11 NOTE — Assessment & Plan Note (Signed)
Preventative protocols reviewed and updated unless pt declined. Discussed healthy diet and lifestyle.  

## 2020-10-11 NOTE — Assessment & Plan Note (Signed)
Chronic, stable on low dose lipitor - continue. The ASCVD Risk score (Goff DC Jr., et al., 2013) failed to calculate for the following reasons:   The valid total cholesterol range is 130 to 320 mg/dL  

## 2020-10-11 NOTE — Assessment & Plan Note (Signed)
Lung exam suspicious for ILD - check CXR. Pt largely asxs, endorses intermittent productive cough.

## 2020-11-07 DIAGNOSIS — H353132 Nonexudative age-related macular degeneration, bilateral, intermediate dry stage: Secondary | ICD-10-CM | POA: Diagnosis not present

## 2021-01-29 ENCOUNTER — Other Ambulatory Visit: Payer: Self-pay

## 2021-01-29 ENCOUNTER — Telehealth (INDEPENDENT_AMBULATORY_CARE_PROVIDER_SITE_OTHER): Payer: Medicare HMO | Admitting: Family Medicine

## 2021-01-29 ENCOUNTER — Encounter: Payer: Self-pay | Admitting: Family Medicine

## 2021-01-29 DIAGNOSIS — J0100 Acute maxillary sinusitis, unspecified: Secondary | ICD-10-CM | POA: Diagnosis not present

## 2021-01-29 DIAGNOSIS — J019 Acute sinusitis, unspecified: Secondary | ICD-10-CM | POA: Insufficient documentation

## 2021-01-29 HISTORY — DX: Acute sinusitis, unspecified: J01.90

## 2021-01-29 MED ORDER — AMOXICILLIN-POT CLAVULANATE 875-125 MG PO TABS: 1.0000 | ORAL_TABLET | Freq: Two times a day (BID) | ORAL | 0 refills | Status: DC

## 2021-01-29 NOTE — Patient Instructions (Signed)
Take the augmentin for sinus infection (take it with food) Drink lots of fluids mucinex is helpful for congestion  Try nasal saline spray for congestion as well  Breathing steam can help also   Update if not starting to improve in a week or if worsening

## 2021-01-29 NOTE — Progress Notes (Signed)
Virtual Visit via Telephone Note  I connected with Keith Jordan on 01/29/21 at 11:00 AM EST by telephone and verified that I am speaking with the correct person using two identifiers.  Location: Patient: home Provider: office   I discussed the limitations, risks, security and privacy concerns of performing an evaluation and management service by telephone and the availability of in person appointments. I also discussed with the patient that there may be a patient responsible charge related to this service. The patient expressed understanding and agreed to proceed.  Parties involved in encounter  Patient: Keith Jordan  Provider:  Loura Pardon MD   History of Present Illness: 80 yo pr of Dr Darnell Level presents with congestion and facial pressure for 2 weeks     Over 3 weeks ago he had congestion in his chest and some phlegm Then it went into his head and caused sinus and ear congestion   This am more sinus symptoms   (chest feels better overall- just a little tender) Sinus pain this am -not getting better  Both sides equal - right around his eyes (and eyes feel sensitive and heavy)   Very little nasal d/c - some mucous, clear to yellow Runny at times   He did not get tested for covid    Otc: ny quil to start  mucinex -helped  No nasal sprays     covid immunized -last feb with booster   Patient Active Problem List   Diagnosis Date Noted  . Acute sinusitis 01/29/2021  . Abnormal breath sounds 10/11/2020  . Sebaceous cyst 01/30/2020  . Medicare annual wellness visit, subsequent 10/04/2019  . BPH (benign prostatic hyperplasia) 09/29/2018  . Health maintenance examination 09/01/2016  . Advanced care planning/counseling discussion 09/01/2016  . Prediabetes 07/05/2016  . Hyperlipidemia   . Primary osteoarthritis of knee   . Ischemic optic neuropathy   . Personal history of colonic adenoma 07/30/2008   Past Medical History:  Diagnosis Date  . Hyperlipidemia   . Ischemic optic  neuropathy 2006   [place] at [facility]  . Personal history of colonic adenoma 07/30/2008  . Prediabetes 07/05/2016   A1c 6.5% 03/2014   . Primary osteoarthritis of knee    bilateral s/p L knee replacement, receives R knee injections Francis Gaines)   Past Surgical History:  Procedure Laterality Date  . COLONOSCOPY  12/2013   no polyps, no rpt due [name])  . CYST EXCISION  01/2020  . TOTAL KNEE ARTHROPLASTY Left 2005   [name]   Social History   Tobacco Use  . Smoking status: Never Smoker  . Smokeless tobacco: Never Used  Vaping Use  . Vaping Use: Never used  Substance Use Topics  . Alcohol use: Yes    Comment: occasional  . Drug use: No   Family History  Problem Relation Age of Onset  . Blindness Mother 4       ?stroke in eyes  . Lung disease Father        Ecologist  . Arthritis Father   . Colon cancer Neg Hx   . Pancreatic cancer Neg Hx   . Stomach cancer Neg Hx   . Esophageal cancer Neg Hx   . Rectal cancer Neg Hx   . Cancer Neg Hx   . Diabetes Neg Hx   . CAD Neg Hx   . Stroke Neg Hx    No Known Allergies Current Outpatient Medications on File Prior to Visit  Medication Sig Dispense Refill  .  aspirin EC 81 MG tablet Take 1 tablet (81 mg total) by mouth 2 (two) times a week.    Marland Kitchen atorvastatin (LIPITOR) 10 MG tablet Take 1 tablet (10 mg total) by mouth daily. 90 tablet 3  . Multiple Vitamins-Minerals (PRESERVISION AREDS) CAPS Take 1 capsule by mouth in the morning and at bedtime. 180 capsule 3   No current facility-administered medications on file prior to visit.   Review of Systems  Constitutional: Negative for chills, fever and malaise/fatigue.  HENT: Positive for congestion and sinus pain. Negative for ear pain and sore throat.   Eyes: Negative for blurred vision, discharge and redness.  Respiratory: Positive for cough and sputum production. Negative for shortness of breath, wheezing and stridor.   Cardiovascular: Negative for chest pain, palpitations and  leg swelling.  Gastrointestinal: Negative for abdominal pain, diarrhea, nausea and vomiting.  Musculoskeletal: Negative for myalgias.  Skin: Negative for rash.  Neurological: Positive for headaches. Negative for dizziness.    Observations/Objective: Pt sounds well , in no distress Slightly hoarse voice No cough or wheeze audible in conversation  Nl cognition/good historian Nl mood   Assessment and Plan: Problem List Items Addressed This Visit      Respiratory   Acute sinusitis    3 weeks s/p uri (never ruled out covid)  Sinus pain/pressure persist  Taking mucinex Suggested nasal saline and steam for congestion  Px augmentin to take bid Update if not starting to improve in a week or if worsening    Meds ordered this encounter  Medications  . amoxicillin-clavulanate (AUGMENTIN) 875-125 MG tablet    Sig: Take 1 tablet by mouth 2 (two) times daily.    Dispense:  14 tablet    Refill:  0         Relevant Medications   amoxicillin-clavulanate (AUGMENTIN) 875-125 MG tablet       Follow Up Instructions: Take the augmentin for sinus infection (take it with food) Drink lots of fluids mucinex is helpful for congestion  Try nasal saline spray for congestion as well  Breathing steam can help also   Update if not starting to improve in a week or if worsening     I discussed the assessment and treatment plan with the patient. The patient was provided an opportunity to ask questions and all were answered. The patient agreed with the plan and demonstrated an understanding of the instructions.   The patient was advised to call back or seek an in-person evaluation if the symptoms worsen or if the condition fails to improve as anticipated.  I provided 16 minutes of non-face-to-face time during this encounter.   Loura Pardon, MD

## 2021-01-29 NOTE — Assessment & Plan Note (Signed)
3 weeks s/p uri (never ruled out covid)  Sinus pain/pressure persist  Taking mucinex Suggested nasal saline and steam for congestion  Px augmentin to take bid Update if not starting to improve in a week or if worsening    Meds ordered this encounter  Medications  . amoxicillin-clavulanate (AUGMENTIN) 875-125 MG tablet    Sig: Take 1 tablet by mouth 2 (two) times daily.    Dispense:  14 tablet    Refill:  0

## 2021-01-30 ENCOUNTER — Encounter (HOSPITAL_COMMUNITY): Payer: Self-pay | Admitting: Emergency Medicine

## 2021-01-30 ENCOUNTER — Ambulatory Visit (HOSPITAL_COMMUNITY)
Admission: EM | Admit: 2021-01-30 | Discharge: 2021-01-30 | Disposition: A | Discharge status: Home / Self Care | Payer: Medicare HMO | Source: Home / Self Care | Attending: Family Medicine | Admitting: Family Medicine

## 2021-01-30 ENCOUNTER — Encounter: Payer: Self-pay | Admitting: Family Medicine

## 2021-01-30 ENCOUNTER — Emergency Department (HOSPITAL_COMMUNITY)
Admission: EM | Admit: 2021-01-30 | Discharge: 2021-01-30 | Disposition: A | Discharge status: Home / Self Care | Payer: Medicare HMO | Attending: Emergency Medicine | Admitting: Emergency Medicine

## 2021-01-30 ENCOUNTER — Ambulatory Visit (INDEPENDENT_AMBULATORY_CARE_PROVIDER_SITE_OTHER): Payer: Medicare HMO

## 2021-01-30 ENCOUNTER — Other Ambulatory Visit: Payer: Self-pay

## 2021-01-30 DIAGNOSIS — D72829 Elevated white blood cell count, unspecified: Secondary | ICD-10-CM

## 2021-01-30 DIAGNOSIS — Z79899 Other long term (current) drug therapy: Secondary | ICD-10-CM | POA: Insufficient documentation

## 2021-01-30 DIAGNOSIS — C9110 Chronic lymphocytic leukemia of B-cell type not having achieved remission: Secondary | ICD-10-CM

## 2021-01-30 DIAGNOSIS — R002 Palpitations: Secondary | ICD-10-CM

## 2021-01-30 DIAGNOSIS — D696 Thrombocytopenia, unspecified: Secondary | ICD-10-CM | POA: Insufficient documentation

## 2021-01-30 DIAGNOSIS — D72820 Lymphocytosis (symptomatic): Secondary | ICD-10-CM | POA: Diagnosis not present

## 2021-01-30 DIAGNOSIS — R0989 Other specified symptoms and signs involving the circulatory and respiratory systems: Secondary | ICD-10-CM

## 2021-01-30 HISTORY — DX: Elevated white blood cell count, unspecified: D72.829

## 2021-01-30 LAB — CBC WITH DIFFERENTIAL/PLATELET
Abs Immature Granulocytes: 0 10*3/uL (ref 0.00–0.07)
Basophils Absolute: 0 10*3/uL (ref 0.0–0.1)
Basophils Relative: 0 %
Eosinophils Absolute: 0 10*3/uL (ref 0.0–0.5)
Eosinophils Relative: 0 %
HCT: 37.5 % — ABNORMAL LOW (ref 39.0–52.0)
Hemoglobin: 12.4 g/dL — ABNORMAL LOW (ref 13.0–17.0)
Lymphocytes Relative: 95 %
Lymphs Abs: 140.5 10*3/uL — ABNORMAL HIGH (ref 0.7–4.0)
MCH: 32.5 pg (ref 26.0–34.0)
MCHC: 33.1 g/dL (ref 30.0–36.0)
MCV: 98.2 fL (ref 80.0–100.0)
Monocytes Absolute: 0 10*3/uL — ABNORMAL LOW (ref 0.1–1.0)
Monocytes Relative: 0 %
Neutro Abs: 7.4 10*3/uL (ref 1.7–7.7)
Neutrophils Relative %: 5 %
Platelets: 123 10*3/uL — ABNORMAL LOW (ref 150–400)
RBC: 3.82 MIL/uL — ABNORMAL LOW (ref 4.22–5.81)
RDW: 14.7 % (ref 11.5–15.5)
WBC: 147.9 10*3/uL (ref 4.0–10.5)
nRBC: 0 % (ref 0.0–0.2)
nRBC: 0 /100 WBC

## 2021-01-30 LAB — TROPONIN I (HIGH SENSITIVITY)
Troponin I (High Sensitivity): 11 ng/L (ref <18)
Troponin I (High Sensitivity): 11 ng/L (ref <18)

## 2021-01-30 LAB — HEPATIC FUNCTION PANEL
ALT: 15 U/L (ref 0–44)
AST: 32 U/L (ref 15–41)
Albumin: 3.7 g/dL (ref 3.5–5.0)
Alkaline Phosphatase: 68 U/L (ref 38–126)
Bilirubin, Direct: 0.2 mg/dL (ref 0.0–0.2)
Indirect Bilirubin: 0.7 mg/dL (ref 0.3–0.9)
Total Bilirubin: 0.9 mg/dL (ref 0.3–1.2)
Total Protein: 6.4 g/dL — ABNORMAL LOW (ref 6.5–8.1)

## 2021-01-30 LAB — BASIC METABOLIC PANEL
Anion gap: 10 (ref 5–15)
Anion gap: 11 (ref 5–15)
BUN: 28 mg/dL — ABNORMAL HIGH (ref 8–23)
BUN: 29 mg/dL — ABNORMAL HIGH (ref 8–23)
CO2: 28 mmol/L (ref 22–32)
CO2: 27 mmol/L (ref 22–32)
Calcium: 9.5 mg/dL (ref 8.9–10.3)
Calcium: 9.2 mg/dL (ref 8.9–10.3)
Chloride: 102 mmol/L (ref 98–111)
Chloride: 102 mmol/L (ref 98–111)
Creatinine, Ser: 1.22 mg/dL (ref 0.61–1.24)
Creatinine, Ser: 1.19 mg/dL (ref 0.61–1.24)
GFR, Estimated: >60 mL/min (ref >60)
GFR, Estimated: >60 mL/min (ref >60)
Glucose, Bld: 130 mg/dL — ABNORMAL HIGH (ref 70–99)
Glucose, Bld: 169 mg/dL — ABNORMAL HIGH (ref 70–99)
Potassium: 5.2 mmol/L — ABNORMAL HIGH (ref 3.5–5.1)
Potassium: 4.1 mmol/L (ref 3.5–5.1)
Sodium: 140 mmol/L (ref 135–145)
Sodium: 140 mmol/L (ref 135–145)

## 2021-01-30 LAB — CBC
HCT: 41.2 % (ref 39.0–52.0)
Hemoglobin: 13.5 g/dL (ref 13.0–17.0)
MCH: 31.5 pg (ref 26.0–34.0)
MCHC: 32.8 g/dL (ref 30.0–36.0)
MCV: 96.3 fL (ref 80.0–100.0)
Platelets: 132 10*3/uL — ABNORMAL LOW (ref 150–400)
RBC: 4.28 MIL/uL (ref 4.22–5.81)
RDW: 14.9 % (ref 11.5–15.5)
WBC: 186.3 10*3/uL (ref 4.0–10.5)
nRBC: 0 % (ref 0.0–0.2)

## 2021-01-30 LAB — TSH: TSH: 1.969 u[IU]/mL (ref 0.350–4.500)

## 2021-01-30 LAB — LACTATE DEHYDROGENASE: LDH: 189 U/L (ref 98–192)

## 2021-01-30 LAB — PATHOLOGIST SMEAR REVIEW

## 2021-01-30 NOTE — Discharge Instructions (Signed)
Please read and follow all provided instructions.  Your diagnoses today include:  1. Chronic lymphocytic leukemia ([place])   2. Thrombocytopenia (H[facility])   3. Palpitations     Tests performed today include: Blood cell counts (white, red, and platelets) - white blood cells (specifically your lymphocyte cells) are extremely high and your platelets are slightly low.  Electrolytes  Kidney function test Blood test for heart irritation - was normal  Vital signs. See below for your results today.   Medications prescribed:   None  Take any prescribed medications only as directed.  Home care instructions:  Follow any educational materials contained in this packet.  BE VERY CAREFUL not to take multiple medicines containing Tylenol (also called acetaminophen). Doing so can lead to an overdose which can damage your liver and cause liver failure and possibly death.   Follow-up instructions: Call the hematologist office tomorrow to schedule an appointment.  They should be to get you in early next week.  Return instructions:   Please return to the Emergency Department if you experience worsening symptoms.   Please return if you have any other emergent concerns.  Additional Information:  Your vital signs today were: BP 125/60 (BP Location: Right Arm)   Pulse 78   Temp 98.9 F (37.2 C)   Resp 16   SpO2 96%  If your blood pressure (BP) was elevated above 135/85 this visit, please have this repeated by your doctor within one month. --------------

## 2021-01-30 NOTE — ED Provider Notes (Signed)
[place]   [id number] 01/30/21 Arrival Time: 7408  ASSESSMENT & PLAN:  1. Palpitations   2. Chest congestion   3. Leukocytosis, unspecified type   4. Thrombocytopenia ([place])     Called lab; they are unable to add differential and smear. Spoke with pt. Discussed concern for leukemia. No previous CBC in chart. To ED for further evaluation.  Labs Reviewed  CBC - Abnormal; Notable for the following components:      Result Value   WBC 186.3 (*)    Platelets 132 (*)    All other components within normal limits  TSH  BASIC METABOLIC PANEL    ECG: Performed today and interpreted by me: normal EKG, normal sinus rhythm; PVC noted; no STEMI.  I have personally viewed the imaging studies ordered this visit. Normal CXR.   Chest pain precautions given.  Reviewed expectations re: course of current medical issues. Questions answered. Outlined signs and symptoms indicating need for more acute intervention. Patient verbalized understanding. After Visit Summary given.   SUBJECTIVE:  History from: patient. Keith Jordan is a 80 y.o. male who reports "feeling like my heart is skipping beats"; first noted approx 3 w ago; sporadic and transient; worse last evening; without assoc CP/SOB/n/v/diaphoresis. No h/o similar. Mild "chest congestion" over this time period. Mild fatigue. Denies: lower extremity edema, near-syncope, orthopnea, PND, and syncope. No specific aggravating or alleviating factors reported. Recent illnesses: none. Fever: none. Ambulatory without assistance. Self/OTC treatment: none; no new medications. Normal bowel/bladder habits. Minimal caffeine use.  Social History   Tobacco Use  Smoking Status Never Smoker  Smokeless Tobacco Never Used   Social History   Substance and Sexual Activity  Alcohol Use Yes   Comment: occasional     OBJECTIVE:  Vitals:   01/30/21 1102  BP: (!) 146/79  Pulse: 76  Resp: 17  Temp: 98.5 F (36.9 C)  TempSrc:  Oral  SpO2: 96%    General appearance: alert, oriented, no acute distress Eyes: PERRLA; EOMI; conjunctivae normal HENT: normocephalic; atraumatic Neck: supple with FROM Lungs: without labored respirations; speaks full sentences without difficulty; CTAB Heart: regular rate and rhythm; occasional PVC heard Chest Wall: without tenderness to palpation Abdomen: soft, non-tender; no guarding or rebound tenderness Extremities: without edema; without calf swelling or tenderness; symmetrical without gross deformities Skin: warm and dry; without rash or lesions Neuro: normal gait Psychological: alert and cooperative; normal mood and affect   Imaging: DG Chest 2 View  Result Date: 01/30/2021 CLINICAL DATA:  Chest congestion and palpitations. EXAM: CHEST - 2 VIEW COMPARISON:  Chest x-ray dated October 11, 2020. FINDINGS: The heart size and mediastinal contours are within normal limits. Both lungs are clear. The visualized skeletal structures are unremarkable. IMPRESSION: No active cardiopulmonary disease. Electronically Signed   By: Titus Dubin M.D.   On: 01/30/2021 11:32    Clinical Course as of 01/30/21 1256  Thu Jan 30, 2021  1238 WBC(!!): 186.3 Noted. [BH]    Clinical Course User Index [BH] Vanessa Kick, MD    No Known Allergies  Past Medical History:  Diagnosis Date  . Hyperlipidemia   . Ischemic optic neuropathy 2006   [facility] at [facility]  . Personal history of colonic adenoma 07/30/2008  . Prediabetes 07/05/2016   A1c 6.5% 03/2014   . Primary osteoarthritis of knee    bilateral s/p L knee replacement, receives R knee injections [name], Noemi [place])   Social History   Socioeconomic History  . Marital status: Married  Spouse name: Not on file  . Number of children: Not on file  . Years of education: Not on file  . Highest education level: Not on file  Occupational History  . Not on file  Tobacco Use  . Smoking status: Never Smoker  . Smokeless tobacco: Never Used   Vaping Use  . Vaping Use: Never used  Substance and Sexual Activity  . Alcohol use: Yes    Comment: occasional  . Drug use: No  . Sexual activity: Not Currently  Other Topics Concern  . Not on file  Social History Narrative   Lives with wife, 2 cats   Occ: retired Personal assistant   Activity: works in yard   Diet: some water, fruits/vegetables daily   Social Determinants of Radio broadcast assistant Strain: [place]   . Difficulty of Paying Living Expenses: Not hard at all  Food Insecurity: No Food Insecurity  . Worried About Charity fundraiser in the Last Year: Never true  . Ran Out of Food in the Last Year: Never true  Transportation Needs: No Transportation Needs  . Lack of Transportation (Medical): No  . Lack of Transportation (Non-Medical): No  Physical Activity: Inactive  . Days of Exercise per Week: 0 days  . Minutes of Exercise per Session: 0 min  Stress: No Stress Concern Present  . Feeling of Stress : Not at all  Social Connections: Not on file  Intimate Partner Violence: Not At Risk  . Fear of Current or Ex-Partner: No  . Emotionally Abused: No  . Physically Abused: No  . Sexually Abused: No   Family History  Problem Relation Age of Onset  . Blindness Mother 30       ?stroke in eyes  . Lung disease Father        Ecologist  . Arthritis Father   . Colon cancer Neg Hx   . Pancreatic cancer Neg Hx   . Stomach cancer Neg Hx   . Esophageal cancer Neg Hx   . Rectal cancer Neg Hx   . Cancer Neg Hx   . Diabetes Neg Hx   . CAD Neg Hx   . Stroke Neg Hx    Past Surgical History:  Procedure Laterality Date  . COLONOSCOPY  12/2013   no polyps, no rpt due [name])  . CYST EXCISION  01/2020  . TOTAL KNEE ARTHROPLASTY Left 2005   Veda Canning, Aaron Edelman, MD 01/30/21 1256

## 2021-01-30 NOTE — ED Provider Notes (Signed)
[facility] EMERGENCY DEPARTMENT Provider Note   CSN: [id number] Arrival date & time: 01/30/21  1329     History Chief Complaint  Patient presents with  . Abnormal Lab    Keith Jordan is a 80 y.o. male.  Patient with history of hyperlipidemia, 3 week history of chest congestion, phlegm, sinus pressure and was started on Augmentin yesterday by PCP (has taken one dose thus far), seen at urgent care today after having palpitations for about 2.5 hrs last night and was incidentally found to have WBC 186k and platelet level of 132. EKG showed normal sinus rhythm with no ischemic changes and chest x-ray did not show any abnormalities.  Patient states that overall he has been feeling fairly well recently.  He was out working in his yard yesterday.  He states that the congestion symptoms have been intermittent -- Mucinex seems to help at times.  He also has had facial pressure and congestion which is now largely resolved.  He reports having some minor night sweats over the past several weeks starting before the new year.  He denies unusual easy bruising or bleeding.  Does states that he takes aspirin.  His energy level has been good.  He has been eating and drinking well.  Denies associated chest pain, shortness of breath.  No abdominal pain, nausea, vomiting, diarrhea or blood in the stool.  No urine problems.  He does not have a history of any blood disorders.  States his last medical checkup was about 3 months ago.        Past Medical History:  Diagnosis Date  . Hyperlipidemia   . Ischemic optic neuropathy 2006   [place] [facility]  . Personal history of colonic adenoma 07/30/2008  . Prediabetes 07/05/2016   A1c 6.5% 03/2014   . Primary osteoarthritis of knee    bilateral s/p L knee replacement, receives R knee injections Francis Gaines)    Patient Active Problem List   Diagnosis Date Noted  . Acute sinusitis 01/29/2021  . Abnormal breath sounds 10/11/2020  .  Sebaceous cyst 01/30/2020  . Medicare annual wellness visit, subsequent 10/04/2019  . BPH (benign prostatic hyperplasia) 09/29/2018  . Health maintenance examination 09/01/2016  . Advanced care planning/counseling discussion 09/01/2016  . Prediabetes 07/05/2016  . Hyperlipidemia   . Primary osteoarthritis of knee   . Ischemic optic neuropathy   . Personal history of colonic adenoma 07/30/2008    Past Surgical History:  Procedure Laterality Date  . COLONOSCOPY  12/2013   no polyps, no rpt due [name])  . CYST EXCISION  01/2020  . TOTAL KNEE ARTHROPLASTY Left 2005   [name]       Family History  Problem Relation Age of Onset  . Blindness Mother 36       ?stroke in eyes  . Lung disease Father        Ecologist  . Arthritis Father   . Colon cancer Neg Hx   . Pancreatic cancer Neg Hx   . Stomach cancer Neg Hx   . Esophageal cancer Neg Hx   . Rectal cancer Neg Hx   . Cancer Neg Hx   . Diabetes Neg Hx   . CAD Neg Hx   . Stroke Neg Hx     Social History   Tobacco Use  . Smoking status: Never Smoker  . Smokeless tobacco: Never Used  Vaping Use  . Vaping Use: Never used  Substance Use Topics  . Alcohol use:  Yes    Comment: occasional  . Drug use: No    Home Medications Prior to Admission medications   Medication Sig Start Date End Date Taking? Authorizing Provider  amoxicillin-clavulanate (AUGMENTIN) 875-125 MG tablet Take 1 tablet by mouth 2 (two) times daily. 01/29/21   Tower, Wynelle Fanny, MD  aspirin EC 81 MG tablet Take 1 tablet (81 mg total) by mouth 2 (two) times a week. 10/14/20   [name], MD  atorvastatin (LIPITOR) 10 MG tablet Take 1 tablet (10 mg total) by mouth daily. 10/11/20   [name], MD  Multiple Vitamins-Minerals (PRESERVISION AREDS) CAPS Take 1 capsule by mouth in the morning and at bedtime. 10/11/20   [name], MD    Allergies    Patient has no known allergies.  Review of Systems   Review of Systems  Constitutional:  Positive for diaphoresis. Negative for appetite change, chills, fatigue, fever and unexpected weight change.  HENT: Positive for congestion. Negative for rhinorrhea and sore throat.   Eyes: Negative for redness.  Respiratory: Positive for cough. Negative for shortness of breath.   Cardiovascular: Negative for chest pain.  Gastrointestinal: Negative for abdominal pain, diarrhea, nausea and vomiting.  Genitourinary: Negative for dysuria and hematuria.  Musculoskeletal: Negative for myalgias.  Skin: Negative for rash.  Neurological: Negative for headaches.  Hematological: Negative for adenopathy. Does not bruise/bleed easily.    Physical Exam Updated Vital Signs BP (!) 136/92 (BP Location: Left Arm)   Pulse 75   Temp 98.9 F (37.2 C)   Resp 18   SpO2 96%   Physical Exam Vitals and nursing note reviewed.  Constitutional:      Appearance: He is well-developed and well-nourished.  HENT:     Head: Normocephalic and atraumatic.  Eyes:     General:        Right eye: No discharge.        Left eye: No discharge.     Conjunctiva/sclera: Conjunctivae normal.  Cardiovascular:     Rate and Rhythm: Normal rate and regular rhythm.     Heart sounds: Normal heart sounds.     Comments: Regular rate and rhythm on exam. Pulmonary:     Effort: Pulmonary effort is normal.     Breath sounds: Normal breath sounds.  Abdominal:     Palpations: Abdomen is soft.     Tenderness: There is no abdominal tenderness.  Musculoskeletal:     Cervical back: Normal range of motion and neck supple.  Skin:    General: Skin is warm and dry.  Neurological:     General: No focal deficit present.     Mental Status: He is alert.     Cranial Nerves: No cranial nerve deficit.  Psychiatric:        Mood and Affect: Mood and affect normal.     ED Results / Procedures / Treatments   Labs (all labs ordered are listed, but only abnormal results are displayed) Labs Reviewed  BASIC METABOLIC PANEL - Abnormal;  Notable for the following components:      Result Value   Glucose, Bld 169 (*)    BUN 29 (*)    All other components within normal limits  CBC WITH DIFFERENTIAL/PLATELET - Abnormal; Notable for the following components:   WBC 147.9 (*)    RBC 3.82 (*)    Hemoglobin 12.4 (*)    HCT 37.5 (*)    Platelets 123 (*)    Lymphs Abs 140.5 (*)    Monocytes Absolute  0.0 (*)    All other components within normal limits  PATHOLOGIST SMEAR REVIEW  HEPATIC FUNCTION PANEL  LACTATE DEHYDROGENASE  TROPONIN I (HIGH SENSITIVITY)  TROPONIN I (HIGH SENSITIVITY)    EKG EKG Interpretation  Date/Time:  Thursday January 30 2021 13:59:12 EST Ventricular Rate:  72 PR Interval:  200 QRS Duration: 90 QT Interval:  380 QTC Calculation: 416 R Axis:   82 Text Interpretation: Normal sinus rhythm Normal ECG Confirmed by Davonna Belling [phone]) on 01/30/2021 2:53:33 PM   Radiology DG Chest 2 View  Result Date: 01/30/2021 CLINICAL DATA:  Chest congestion and palpitations. EXAM: CHEST - 2 VIEW COMPARISON:  Chest x-ray dated October 11, 2020. FINDINGS: The heart size and mediastinal contours are within normal limits. Both lungs are clear. The visualized skeletal structures are unremarkable. IMPRESSION: No active cardiopulmonary disease. Electronically Signed   By: Titus Dubin M.D.   On: 01/30/2021 11:32    Procedures Procedures   Medications Ordered in ED Medications - No data to display  ED Course  I have reviewed the triage vital signs and the nursing notes.  Pertinent labs & imaging results that were available during my care of the patient were reviewed by me and considered in my medical decision making (see chart for details).  Patient seen and examined.  Reviewed records from PCP and urgent care.  Currently awaiting repeat CBC with differential.  Vital signs reviewed and are as follows: BP (!) 136/92 (BP Location: Left Arm)   Pulse 75   Temp 98.9 F (37.2 C)   Resp 18   SpO2 96%   4:52  PM CBC shows lymphocytosis.  Flow cytometry is concerning for CLL.  I have discussed the case with Dr. Lorenso Courier of hematology/oncology.  Agrees that this is very likely chronic lymphocytic leukemia.  They will help make arrangements to follow-up with the patient early next week.  At this point, there is no indication for admission.  Patient will follow up with PCP if he continues to have heart palpitations.  Remainder of work-up today is very reassuring.  Patient encouraged to return or seek medical attention if he develops chest pain, shortness of breath, lightheadedness or syncope.  Otherwise he is strongly encouraged to follow-up with Dr. Lorenso Courier next week.  Questions answered.  Patient is relieved to be going home tonight.    MDM Rules/Calculators/A&P                          Patient with chest congestion and upper respiratory symptoms over the past several weeks with palpitations last night.  Patient was found today with extremely elevated lymphocyte count.  Likely new diagnosis of CLL.  No indications for admission otherwise and patient discussed with oncology on-call.  Plan for close in-office follow-up.   Final Clinical Impression(s) / ED Diagnoses Final diagnoses:  Chronic lymphocytic leukemia ([place])  Thrombocytopenia ([place])  Palpitations    Rx / DC Orders ED Discharge Orders    None       [place] Cater, Hershal Coria 01/30/21 1655    Davonna Belling, MD 01/30/21 2307

## 2021-01-30 NOTE — ED Triage Notes (Signed)
Pt arrives to ED after being seen at urgent care for a 3 week hx of palpations, chest congestion and was found to have leukocytosis & thrombocytopenia.   WBC 186.3 & Platelets 132

## 2021-01-30 NOTE — Discharge Instructions (Addendum)
You have been seen at the [facility] today for feeling like your heart is skipping beats. Your evaluation today was not suggestive of any emergent condition requiring medical intervention at this time. Your ECG (heart tracing) did not show any worrisome changes. However, some medical problems make take more time to appear. Therefore, it's very important that you pay attention to any new symptoms or worsening of your current condition.  Please proceed directly to the Emergency Department immediately should you feel worse in any way or have any of the following symptoms: chest pain, pain that spreads to your arm, neck, jaw, back or abdomen, shortness of breath, or nausea and vomiting.  You have had labs (blood work) drawn today. We will call you with any significant abnormalities or if there is need to begin or change treatment or pursue further follow up.  You may also review your test results online through [facility]. If you do not have a MyChart account, instructions to sign up should be on your discharge paperwork.

## 2021-01-30 NOTE — ED Triage Notes (Signed)
Pt presents with chest congestion xs 3 weeks. States feels as if heart is skipping beats.

## 2021-01-31 ENCOUNTER — Telehealth: Payer: Self-pay | Admitting: Hematology and Oncology

## 2021-01-31 LAB — SURGICAL PATHOLOGY

## 2021-01-31 NOTE — Telephone Encounter (Signed)
Keith Jordan cld to confirm his appt w/Dr. Lorenso Courier on 2/9 at 930am. Pt aware to arrive 20 minutes early.

## 2021-02-04 NOTE — Progress Notes (Signed)
[place] [phone]) [phone]   Fax:([phone]) [place] NOTE  Patient Care Team: [name], MD as PCP - General (Family Medicine) Specialists, [facility] as Consulting Physician (Orthopedic Surgery)  Hematological/Oncological History # Lymphocytosis Concerning for Lymphoproliferative Disorder 01/30/2021: WBC 186.3, Hgb 13.5, MCV 96.3, Plt 132. Referred to Emergency Department. 02/05/2021: establish care with Dr. Lorenso Courier   CHIEF COMPLAINTS/PURPOSE OF CONSULTATION:  "Lymphocytosis "  HISTORY OF PRESENTING ILLNESS:  Keith Jordan 80 y.o. male with medical history significant for HLD, pre DM type II, and osteoarthritis of the knee who presents for evaluation of marked lymphocytosis.   On review of the previous records [name] was seen by his primary care provider on 01/29/2021.  At that time he was noted to have congestion and facial pressure for 2 weeks time as well as some chest congestion and phlegm production for the last 3 weeks.  He had a CBC ordered which showed a white blood cell count of 186.3, hemoglobin of 13.5, MCV of 96.3, platelets of 132.  Due to concern for this patient's markedly high leukocytosis he was sent to the emergency department.  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  The patient presents today for further evaluation and management.  On exam today [name] notes that he originally presented to an urgent care for chest congestion which had been going on for approximately 3 weeks time. He notes that he felt like he was having some palpitations in his chest and he was having some light sweats at night. He notes that he was not having any drenching or clothing/sheets changing sweats. He reports that his appetite has been good and he has been eating well. He is not having any swelling of his  abdomen or of his lower extremities. Importantly he also denies any fevers, chills, sweats, nausea, vomiting or diarrhea.  On further discussion he reports that his father has a history of black lung and his mother died of old age. He has no brothers or sisters and has two healthy children. He is a never smoker and does not drink any alcohol. A full ten point ROS is listed below.  MEDICAL HISTORY:  Past Medical History:  Diagnosis Date  . Hyperlipidemia   . Ischemic optic neuropathy 2006   [place] at [facility]  . Personal history of colonic adenoma 07/30/2008  . Prediabetes 07/05/2016   A1c 6.5% 03/2014   . Primary osteoarthritis of knee    bilateral s/p L knee replacement, receives R knee injections [name], Noemi Chapel)    SURGICAL HISTORY: Past Surgical History:  Procedure Laterality Date  . COLONOSCOPY  12/2013   no polyps, no rpt due [name])  . CYST EXCISION  01/2020  . TOTAL KNEE ARTHROPLASTY Left 2005   [name]    SOCIAL HISTORY: Social History   Socioeconomic History  . Marital status: Married    Spouse name: Not on file  . Number of children: Not on file  . Years of education: Not on file  . Highest education level: Not on file  Occupational History  . Not on file  Tobacco Use  . Smoking status: Never Smoker  . Smokeless tobacco: Never Used  Vaping Use  . Vaping Use: Never used  Substance and Sexual Activity  . Alcohol use: Yes    Comment: occasional  . Drug use: No  . Sexual activity: Not Currently  Other Topics Concern  . Not on file  Social History Narrative   Lives with wife, 2 cats   Occ: retired Personal assistant   Activity: works in yard   Diet: some water, fruits/vegetables daily   Social Determinants of Radio broadcast assistant Strain: [place]   . Difficulty of Paying Living Expenses: Not hard at all  Food Insecurity: No Food Insecurity  . Worried About Charity fundraiser in the Last Year: Never true  . Ran Out of Food in the Last Year: Never true   Transportation Needs: No Transportation Needs  . Lack of Transportation (Medical): No  . Lack of Transportation (Non-Medical): No  Physical Activity: Inactive  . Days of Exercise per Week: 0 days  . Minutes of Exercise per Session: 0 min  Stress: No Stress Concern Present  . Feeling of Stress : Not at all  Social Connections: Not on file  Intimate Partner Violence: Not At Risk  . Fear of Current or Ex-Partner: No  . Emotionally Abused: No  . Physically Abused: No  . Sexually Abused: No    FAMILY HISTORY: Family History  Problem Relation Age of Onset  . Blindness Mother 21       ?stroke in eyes  . Lung disease Father        Ecologist  . Arthritis Father   . Colon cancer Neg Hx   . Pancreatic cancer Neg Hx   . Stomach cancer Neg Hx   . Esophageal cancer Neg Hx   . Rectal cancer Neg Hx   . Cancer Neg Hx   . Diabetes Neg Hx   . CAD Neg Hx   . Stroke Neg Hx     ALLERGIES:  has No Known Allergies.  MEDICATIONS:  Current Outpatient Medications  Medication Sig Dispense Refill  . acetaminophen (TYLENOL) 500 MG tablet Take 500 mg by mouth every 6 (six) hours as needed for mild pain.    Marland Kitchen amoxicillin-clavulanate (AUGMENTIN) 875-125 MG tablet Take 1 tablet by mouth 2 (two) times daily. 14 tablet 0  . Ascorbic Acid (VITAMIN C) 1000 MG tablet Take 500 mg by mouth 3 (three) times a week.    Marland Kitchen aspirin EC 81 MG tablet Take 1 tablet (81 mg total) by mouth 2 (two) times a week. (Patient taking differently: Take 81 mg by mouth 3 (three) times a week.)    . atorvastatin (LIPITOR) 10 MG tablet Take 1 tablet (10 mg total) by mouth daily. 90 tablet 3  . carboxymethylcellulose (REFRESH PLUS) 0.5 % SOLN Place 1 drop into both eyes 2 (two) times daily as needed (dry eyes).    . Multiple Vitamins-Minerals (PRESERVISION AREDS) CAPS Take 1 capsule by mouth in the morning and at bedtime. 180 capsule 3  . sodium chloride (OCEAN) 0.65 % SOLN nasal spray Place 1 spray into both nostrils as needed for  congestion.     No current facility-administered medications for this visit.    REVIEW OF SYSTEMS:   Constitutional: ( - ) fevers, ( - )  chills , ( - ) night sweats Eyes: ( - ) blurriness of vision, ( - ) double vision, ( - ) watery eyes Ears, nose, mouth, throat, and face: ( - ) mucositis, ( - ) sore throat Respiratory: ( - ) cough, ( - ) dyspnea, ( - ) wheezes Cardiovascular: ( - ) palpitation, ( - ) chest discomfort, ( - ) lower extremity swelling Gastrointestinal:  ( - ) nausea, ( - )  heartburn, ( - ) change in bowel habits Skin: ( - ) abnormal skin rashes Lymphatics: ( - ) new lymphadenopathy, ( - ) easy bruising Neurological: ( - ) numbness, ( - ) tingling, ( - ) new weaknesses Behavioral/Psych: ( - ) mood change, ( - ) new changes  All other systems were reviewed with the patient and are negative.  PHYSICAL EXAMINATION: ECOG PERFORMANCE STATUS: 1 - Symptomatic but completely ambulatory  Vitals:   02/05/21 0930  BP: 137/63  Pulse: 78  Resp: 15  Temp: 97.8 F (36.6 C)  SpO2: 98%   Filed Weights   02/05/21 0930  Weight: 151 lb 3.2 oz (68.6 kg)    GENERAL: well appearing elderly Caucasian male in NAD  SKIN: skin color, texture, turgor are normal, no rashes or significant lesions EYES: conjunctiva are pink and non-injected, sclera clear NECK: supple, non-tender LYMPH:  no palpable lymphadenopathy in the cervical, axillary or supraclavicular lymph nodes.  LUNGS: clear to auscultation and percussion with normal breathing effort HEART: regular rate & rhythm and no murmurs and no lower extremity edema ABDOMEN: soft, non-tender, non-distended, normal bowel sounds. Palpable spleen, moderate enlargement. No hepatomegaly.  Musculoskeletal: no cyanosis of digits and no clubbing  PSYCH: alert & oriented x 3, fluent speech NEURO: no focal motor/sensory deficits  LABORATORY DATA:  I have reviewed the data as listed CBC Latest Ref Rng & Units 02/05/2021 01/30/2021 01/30/2021  WBC  4.0 - 10.5 K/uL 173.3(HH) 147.9(HH) 186.3(HH)  Hemoglobin 13.0 - 17.0 g/dL 12.9(L) 12.4(L) 13.5  Hematocrit 39.0 - 52.0 % 40.7 37.5(L) 41.2  Platelets 150 - 400 K/uL 136(L) 123(L) 132(L)    CMP Latest Ref Rng & Units 02/05/2021 01/30/2021 01/30/2021  Glucose 70 - 99 mg/dL 145(H) - 169(H)  BUN 8 - 23 mg/dL 25(H) - 29(H)  Creatinine 0.61 - 1.24 mg/dL 1.18 - 1.19  Sodium 135 - 145 mmol/L 140 - 140  Potassium 3.5 - 5.1 mmol/L 4.4 - 4.1  Chloride 98 - 111 mmol/L 104 - 102  CO2 22 - 32 mmol/L 29 - 27  Calcium 8.9 - 10.3 mg/dL 9.5 - 9.2  Total Protein 6.5 - 8.1 g/dL 7.2 6.4(L) -  Total Bilirubin 0.3 - 1.2 mg/dL 0.6 0.9 -  Alkaline Phos 38 - 126 U/L 96 68 -  AST 15 - 41 U/L 31 32 -  ALT 0 - 44 U/L 15 15 -     PATHOLOGY: Surgical Pathology  CASE: [id number]  PATIENT: Huntley Estelle  Flow Pathology Report   Clinical history: Atypical lymphocytosis   DIAGNOSIS:   - Monoclonal B-cell population identified  - See comment   COMMENT:   Flow cytometric analysis of the lymphoid population shows a major  monoclonal, kappa restricted B-cell population representing 78% of  lymphocytes with expression of B-cell antigens including CD20 but with  lack of CD5, CD10, CD25, CD34 or CD103 expression. Review of peripheral  blood shows significant atypical lymphocytosis characterized by  predominance of small to medium sized lymphocytes with round to slightly  irregular nuclei, coarse chromatin, inconspicuous nucleoli and scanty to  moderately abundant agranular blue cytoplasm with projections. The main  considerations include marginal zone lymphoma, splenic lymphoma, and  less likely CD5-negative chronic lymphocytic leukemia. Clinical  correlation is recommended.   GATING AND PHENOTYPIC ANALYSIS:   Gated population: Flow cytometric immunophenotyping is performed using  antibodies to the antigens listed in the table below. Electronic gates  are placed around a cell cluster displaying light  scatter properties  corresponding to: lymphocytes (total lymphocyte count is 147.9 K/uL)   Abnormal Cells in gated population: 78%   Phenotype of Abnormal Cells: CD11c, CD19, CD20, CD22, CD200, Kappa            Lymphoid Antigens    Myeloid Antigens  Miscellaneous  CD2 NEG CD10 NEG CD11b   ND  CD45 POS  CD3 NEG CD19 POS CD11c   POS HLA-Dr  ND  CD4 NEG CD20 POS CD13 ND  CD34 NEG  CD5 NEG CD22 POS CD14 ND  CD38 NEG  CD7 NEG CD79b   ND  CD15 ND  CD138   ND  CD8 NEG CD103   NEG CD16 ND  TdT ND  CD25 NEG CD200   POS CD33 ND  CD123   ND  TCRab   ND  sKappa  POS CD64 ND  CD41 ND  TCRgd   NEG sLambda  NEG CD117   ND  CD61 ND  CD56 NEG cKappa  ND  MPO ND  CD71 ND  CD57 ND  cLambda  ND    CD235aND   GROSS DESCRIPTION:   One lavender top tube submitted from [facility] for lymphoma  testing.   Final Diagnosis performed by Susanne Greenhouse, MD.  Electronically signed  01/31/2021  Technical and / or Professional components performed at [facility], [place] [address] [place], [place] [address].  The above tests were developed and their performance characteristics  determined by the [facility] system for the physical and  immunophenotypic characterization of cell populations. They have not  been cleared by the U.S. Food and Drug administration. The FDA has  determined that such clearance or approval is not necessary. This test  is used for clinical purposes. It should not be regarded as  investigational or for research   RADIOGRAPHIC STUDIES: DG Chest 2 View  Result Date: 01/30/2021 CLINICAL DATA:  Chest congestion and palpitations. EXAM: CHEST - 2 VIEW COMPARISON:  Chest x-ray dated October 11, 2020. FINDINGS: The heart size and mediastinal contours are within normal limits. Both lungs are clear. The visualized skeletal structures are unremarkable. IMPRESSION: No active  cardiopulmonary disease. Electronically Signed   By: Titus Dubin M.D.   On: 01/30/2021 11:32    ASSESSMENT & PLAN TREY GULBRANSON 80 y.o. male with medical history significant for HLD, pre DM type II, and osteoarthritis of the knee who presents for evaluation of marked lymphocytosis.   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 Given his relative stability other than marked leukocytosis we will conduct a full work-up to include a CT chest abdomen pelvis as well as a bone marrow biopsy in order to assure the diagnosis. The initial flow cytometry was strongly consistent with a splenic marginal zone lymphoma. As such we will prepare to start therapy with rituximab 325 mg per metered squared weekly x4 doses in order to effectively treat this. We will sure that we receive all the biopsy material and complete staging scans before the start of this treatment. In the event he was found to have a more aggressive lymphoma we would need to change the plans, however given the initial results we have so far I would recommend that we conduct a full work-up at a time.  At this time there is no evidence of spontaneous tumor lysis syndrome and no evidence of leukostasis. He is having some rare palpitations, but is not having  any visual changes, shortness of breath, or other concerning findings. We gave him strict return precautions if he was to have the symptoms. We will have him back once her work-up is completed and anticipate will be starting therapy within 1 to 2 weeks.  # Lymphocytosis Concerning for Lymphoproliferative Disorder #Splenic Marginal Zone Lymphoma --findings are most concerning for a splenic marginal zone lymphoma. Will personally review peripheral blood film today --Baseline testing of hepatitis B, hepatitis C, LDH, and SPEP with serum free light chains. --Guidelines recommend bone marrow biopsy with a CT chest  abdomen pelvis to search for other sites of disease. --Treatment of choice with confirmed splenic marginal zone lymphoma would be rituximab 375 mg/m q. 7 days x 4 doses. --RTC in 2 weeks with interval workup.   Orders Placed This Encounter  Procedures  . CT CHEST ABDOMEN PELVIS W CONTRAST    Standing Status:   Future    Standing Expiration Date:   02/05/2022    Order Specific Question:   If indicated for the ordered procedure, I authorize the administration of contrast media per Radiology protocol    Answer:   Yes    Order Specific Question:   Preferred imaging location?    Answer:   [facility]    Order Specific Question:   Is Oral Contrast requested for this exam?    Answer:   Yes, Per Radiology protocol    Order Specific Question:   Reason for Exam (SYMPTOM  OR DIAGNOSIS REQUIRED)    Answer:   concern for splenic marginal zone lymphoma  . CT Biopsy    Standing Status:   Future    Standing Expiration Date:   02/05/2022    Order Specific Question:   Lab orders requested (DO NOT place separate lab orders, these will be automatically ordered during procedure specimen collection):    Answer:   Surgical Pathology    Order Specific Question:   Reason for Exam (SYMPTOM  OR DIAGNOSIS REQUIRED)    Answer:   concern for marginal zone lymphoma, requesting bone marrow biopsy    Order Specific Question:   Preferred location?    Answer:   [facility]  . CT BONE MARROW BIOPSY & ASPIRATION    Standing Status:   Future    Standing Expiration Date:   02/05/2022    Order Specific Question:   Reason for Exam (SYMPTOM  OR DIAGNOSIS REQUIRED)    Answer:   concern for marginal zone lymphoma, requesting bone marrow biopsy    Order Specific Question:   Preferred location?    Answer:   [facility]  . CBC with Differential ([facility] Only)    Standing Status:   Future    Number of Occurrences:   1    Standing Expiration Date:   02/05/2022  . Lactate dehydrogenase (LDH)     Standing Status:   Future    Number of Occurrences:   1    Standing Expiration Date:   02/05/2022  . CMP ([facility] only)    Standing Status:   Future    Number of Occurrences:   1    Standing Expiration Date:   02/05/2022  . Uric acid    Standing Status:   Future    Number of Occurrences:   1    Standing Expiration Date:   02/05/2022  . Save Smear (SSMR)    Standing Status:   Future    Number of Occurrences:   1  Standing Expiration Date:   02/05/2022  . Sedimentation rate    Standing Status:   Future    Number of Occurrences:   1    Standing Expiration Date:   02/05/2022  . Hepatitis B surface antibody    Standing Status:   Future    Standing Expiration Date:   02/05/2022  . Hepatitis B surface antigen    Standing Status:   Future    Standing Expiration Date:   02/05/2022  . Hepatitis B core antibody, total    Standing Status:   Future    Standing Expiration Date:   02/05/2022  . Hepatitis C antibody    Standing Status:   Future    Standing Expiration Date:   02/05/2022  . Multiple Myeloma Panel (SPEP&IFE w/QIG)    Standing Status:   Future    Standing Expiration Date:   02/05/2022  . Kappa/lambda light chains    Standing Status:   Future    Standing Expiration Date:   02/05/2022    All questions were answered. The patient knows to call the clinic with any problems, questions or concerns.  A total of more than 60 minutes were spent on this encounter and over half of that time was spent on counseling and coordination of care as outlined above.   Ledell Peoples, MD Department of Hematology/Oncology [facility] Phone: [phone] Pager: [phone] Email: [email or web address]  02/08/2021 5:10 PM

## 2021-02-05 ENCOUNTER — Encounter: Payer: Self-pay | Admitting: Hematology and Oncology

## 2021-02-05 ENCOUNTER — Other Ambulatory Visit: Payer: Self-pay

## 2021-02-05 ENCOUNTER — Telehealth: Payer: Self-pay

## 2021-02-05 ENCOUNTER — Inpatient Hospital Stay: Payer: Medicare HMO

## 2021-02-05 ENCOUNTER — Inpatient Hospital Stay: Payer: Medicare HMO | Attending: Hematology and Oncology | Admitting: Hematology and Oncology

## 2021-02-05 VITALS — BP 137/63 | HR 78 | Temp 97.8°F | Resp 15 | Ht 68.0 in | Wt 151.2 lb

## 2021-02-05 DIAGNOSIS — Z5112 Encounter for antineoplastic immunotherapy: Secondary | ICD-10-CM | POA: Diagnosis not present

## 2021-02-05 DIAGNOSIS — E119 Type 2 diabetes mellitus without complications: Secondary | ICD-10-CM | POA: Insufficient documentation

## 2021-02-05 DIAGNOSIS — D72820 Lymphocytosis (symptomatic): Secondary | ICD-10-CM | POA: Insufficient documentation

## 2021-02-05 DIAGNOSIS — R0989 Other specified symptoms and signs involving the circulatory and respiratory systems: Secondary | ICD-10-CM | POA: Insufficient documentation

## 2021-02-05 DIAGNOSIS — C8307 Small cell B-cell lymphoma, spleen: Secondary | ICD-10-CM

## 2021-02-05 DIAGNOSIS — Z7189 Other specified counseling: Secondary | ICD-10-CM

## 2021-02-05 DIAGNOSIS — E785 Hyperlipidemia, unspecified: Secondary | ICD-10-CM | POA: Insufficient documentation

## 2021-02-05 DIAGNOSIS — R002 Palpitations: Secondary | ICD-10-CM | POA: Diagnosis not present

## 2021-02-05 DIAGNOSIS — M170 Bilateral primary osteoarthritis of knee: Secondary | ICD-10-CM | POA: Diagnosis not present

## 2021-02-05 HISTORY — DX: Small cell b-cell lymphoma, spleen: C83.07

## 2021-02-05 LAB — CBC WITH DIFFERENTIAL (CANCER CENTER ONLY)
Abs Immature Granulocytes: 0.65 10*3/uL — ABNORMAL HIGH (ref 0.00–0.07)
Basophils Absolute: 0.1 10*3/uL (ref 0.0–0.1)
Basophils Relative: 0 %
Eosinophils Absolute: 0.6 10*3/uL — ABNORMAL HIGH (ref 0.0–0.5)
Eosinophils Relative: 0 %
HCT: 40.7 % (ref 39.0–52.0)
Hemoglobin: 12.9 g/dL — ABNORMAL LOW (ref 13.0–17.0)
Immature Granulocytes: 0 %
Lymphocytes Relative: 94 %
Lymphs Abs: 160.9 10*3/uL — ABNORMAL HIGH (ref 0.7–4.0)
MCH: 30.9 pg (ref 26.0–34.0)
MCHC: 31.7 g/dL (ref 30.0–36.0)
MCV: 97.6 fL (ref 80.0–100.0)
Monocytes Absolute: 6.7 10*3/uL — ABNORMAL HIGH (ref 0.1–1.0)
Monocytes Relative: 4 %
Neutro Abs: 4.3 10*3/uL (ref 1.7–7.7)
Neutrophils Relative %: 2 %
Platelet Count: 136 10*3/uL — ABNORMAL LOW (ref 150–400)
RBC: 4.17 MIL/uL — ABNORMAL LOW (ref 4.22–5.81)
RDW: 14.6 % (ref 11.5–15.5)
WBC Count: 173.3 10*3/uL (ref 4.0–10.5)
nRBC: 0 % (ref 0.0–0.2)

## 2021-02-05 LAB — CMP (CANCER CENTER ONLY)
ALT: 15 U/L (ref 0–44)
AST: 31 U/L (ref 15–41)
Albumin: 4.2 g/dL (ref 3.5–5.0)
Alkaline Phosphatase: 96 U/L (ref 38–126)
Anion gap: 7 (ref 5–15)
BUN: 25 mg/dL — ABNORMAL HIGH (ref 8–23)
CO2: 29 mmol/L (ref 22–32)
Calcium: 9.5 mg/dL (ref 8.9–10.3)
Chloride: 104 mmol/L (ref 98–111)
Creatinine: 1.18 mg/dL (ref 0.61–1.24)
GFR, Estimated: >60 mL/min (ref >60)
Glucose, Bld: 145 mg/dL — ABNORMAL HIGH (ref 70–99)
Potassium: 4.4 mmol/L (ref 3.5–5.1)
Sodium: 140 mmol/L (ref 135–145)
Total Bilirubin: 0.6 mg/dL (ref 0.3–1.2)
Total Protein: 7.2 g/dL (ref 6.5–8.1)

## 2021-02-05 LAB — URIC ACID: Uric Acid, Serum: 5.7 mg/dL (ref 3.7–8.6)

## 2021-02-05 LAB — LACTATE DEHYDROGENASE: LDH: 205 U/L — ABNORMAL HIGH (ref 98–192)

## 2021-02-05 LAB — SEDIMENTATION RATE: Sed Rate: 5 mm/hr (ref 0–16)

## 2021-02-05 LAB — SAVE SMEAR(SSMR), FOR PROVIDER SLIDE REVIEW

## 2021-02-05 NOTE — Progress Notes (Signed)
START ON PATHWAY REGIMEN - Lymphoma and CLL     Administer weekly:     Rituximab-xxxx   **Always confirm dose/schedule in your pharmacy ordering system**  Patient Characteristics: Marginal Zone Lymphoma, Systemic, First Line, Symptomatic Disease Type: Marginal Zone Lymphoma Disease Type: Not Applicable Disease Type: Not Applicable Localized or Systemic Disease<= Systemic Line of Therapy: First Line Asymptomatic or Symptomatic<= Symptomatic Intent of Therapy: Curative Intent, Discussed with Patient

## 2021-02-05 NOTE — Telephone Encounter (Signed)
CRITICAL VALUE STICKER  CRITICAL VALUE: WBC = 173.3  RECEIVER (on-site recipient of call): Yetta Glassman, [place] NOTIFIED: 02/05/21 at 10:50am  MESSENGER (representative from lab): Ulice Dash  MD NOTIFIED: Dr. Lorenso Courier  TIME OF NOTIFICATION: 02/05/21 at 10:53am  RESPONSE: Notification given to Dorina Hoyer., RN for follow-up with provider.

## 2021-02-08 ENCOUNTER — Encounter: Payer: Self-pay | Admitting: Hematology and Oncology

## 2021-02-12 ENCOUNTER — Ambulatory Visit (HOSPITAL_COMMUNITY)
Admission: RE | Admit: 2021-02-12 | Discharge: 2021-02-12 | Disposition: A | Discharge status: Home / Self Care | Payer: Medicare HMO | Source: Ambulatory Visit | Attending: Hematology and Oncology | Admitting: Hematology and Oncology

## 2021-02-12 ENCOUNTER — Other Ambulatory Visit: Payer: Self-pay

## 2021-02-12 DIAGNOSIS — I700 Atherosclerosis of aorta: Secondary | ICD-10-CM | POA: Diagnosis not present

## 2021-02-12 DIAGNOSIS — I2510 Atherosclerotic heart disease of native coronary artery without angina pectoris: Secondary | ICD-10-CM | POA: Diagnosis not present

## 2021-02-12 DIAGNOSIS — K5730 Diverticulosis of large intestine without perforation or abscess without bleeding: Secondary | ICD-10-CM | POA: Diagnosis not present

## 2021-02-12 DIAGNOSIS — N400 Enlarged prostate without lower urinary tract symptoms: Secondary | ICD-10-CM | POA: Diagnosis not present

## 2021-02-12 DIAGNOSIS — Q2546 Tortuous aortic arch: Secondary | ICD-10-CM | POA: Diagnosis not present

## 2021-02-12 DIAGNOSIS — K449 Diaphragmatic hernia without obstruction or gangrene: Secondary | ICD-10-CM | POA: Diagnosis not present

## 2021-02-12 DIAGNOSIS — C8307 Small cell B-cell lymphoma, spleen: Secondary | ICD-10-CM | POA: Insufficient documentation

## 2021-02-12 DIAGNOSIS — N200 Calculus of kidney: Secondary | ICD-10-CM | POA: Diagnosis not present

## 2021-02-12 MED ORDER — IOHEXOL 300 MG/ML  SOLN
100.0000 mL | Freq: Once | INTRAMUSCULAR | Status: AC | PRN
  Administered 2021-02-12: 100 mL via INTRAVENOUS

## 2021-02-19 ENCOUNTER — Inpatient Hospital Stay: Payer: Medicare HMO

## 2021-02-19 ENCOUNTER — Other Ambulatory Visit: Payer: Self-pay

## 2021-02-20 ENCOUNTER — Other Ambulatory Visit: Payer: Self-pay | Admitting: Radiology

## 2021-02-20 ENCOUNTER — Other Ambulatory Visit: Payer: Self-pay | Admitting: Student

## 2021-02-21 ENCOUNTER — Ambulatory Visit (HOSPITAL_COMMUNITY)
Admission: RE | Admit: 2021-02-21 | Discharge: 2021-02-21 | Disposition: A | Discharge status: Home / Self Care | Payer: Medicare HMO | Source: Ambulatory Visit | Attending: Hematology and Oncology | Admitting: Hematology and Oncology

## 2021-02-21 ENCOUNTER — Encounter (HOSPITAL_COMMUNITY): Payer: Self-pay

## 2021-02-21 ENCOUNTER — Other Ambulatory Visit: Payer: Self-pay

## 2021-02-21 DIAGNOSIS — C8307 Small cell B-cell lymphoma, spleen: Secondary | ICD-10-CM

## 2021-02-21 DIAGNOSIS — M170 Bilateral primary osteoarthritis of knee: Secondary | ICD-10-CM | POA: Diagnosis not present

## 2021-02-21 DIAGNOSIS — D696 Thrombocytopenia, unspecified: Secondary | ICD-10-CM | POA: Insufficient documentation

## 2021-02-21 DIAGNOSIS — E119 Type 2 diabetes mellitus without complications: Secondary | ICD-10-CM | POA: Diagnosis not present

## 2021-02-21 DIAGNOSIS — D72820 Lymphocytosis (symptomatic): Secondary | ICD-10-CM | POA: Diagnosis not present

## 2021-02-21 DIAGNOSIS — C8517 Unspecified B-cell lymphoma, spleen: Secondary | ICD-10-CM | POA: Insufficient documentation

## 2021-02-21 DIAGNOSIS — C8590 Non-Hodgkin lymphoma, unspecified, unspecified site: Secondary | ICD-10-CM | POA: Diagnosis not present

## 2021-02-21 DIAGNOSIS — D649 Anemia, unspecified: Secondary | ICD-10-CM | POA: Diagnosis not present

## 2021-02-21 DIAGNOSIS — R0989 Other specified symptoms and signs involving the circulatory and respiratory systems: Secondary | ICD-10-CM | POA: Diagnosis not present

## 2021-02-21 DIAGNOSIS — D6489 Other specified anemias: Secondary | ICD-10-CM | POA: Diagnosis not present

## 2021-02-21 DIAGNOSIS — Z5112 Encounter for antineoplastic immunotherapy: Secondary | ICD-10-CM | POA: Diagnosis not present

## 2021-02-21 DIAGNOSIS — C8519 Unspecified B-cell lymphoma, extranodal and solid organ sites: Secondary | ICD-10-CM | POA: Diagnosis not present

## 2021-02-21 DIAGNOSIS — R002 Palpitations: Secondary | ICD-10-CM | POA: Diagnosis not present

## 2021-02-21 DIAGNOSIS — E785 Hyperlipidemia, unspecified: Secondary | ICD-10-CM | POA: Diagnosis not present

## 2021-02-21 DIAGNOSIS — C8510 Unspecified B-cell lymphoma, unspecified site: Secondary | ICD-10-CM | POA: Insufficient documentation

## 2021-02-21 LAB — CBC WITH DIFFERENTIAL/PLATELET
Abs Immature Granulocytes: 0.79 10*3/uL — ABNORMAL HIGH (ref 0.00–0.07)
Basophils Absolute: 0.1 10*3/uL (ref 0.0–0.1)
Basophils Relative: 0 %
Eosinophils Absolute: 0.5 10*3/uL (ref 0.0–0.5)
Eosinophils Relative: 0 %
HCT: 40.4 % (ref 39.0–52.0)
Hemoglobin: 12.8 g/dL — ABNORMAL LOW (ref 13.0–17.0)
Immature Granulocytes: 1 %
Lymphocytes Relative: 93 %
Lymphs Abs: 157 10*3/uL — ABNORMAL HIGH (ref 0.7–4.0)
MCH: 31.1 pg (ref 26.0–34.0)
MCHC: 31.7 g/dL (ref 30.0–36.0)
MCV: 98.1 fL (ref 80.0–100.0)
Monocytes Absolute: 5.9 10*3/uL — ABNORMAL HIGH (ref 0.1–1.0)
Monocytes Relative: 4 %
Neutro Abs: 4.2 10*3/uL (ref 1.7–7.7)
Neutrophils Relative %: 2 %
Platelets: 136 10*3/uL — ABNORMAL LOW (ref 150–400)
RBC: 4.12 MIL/uL — ABNORMAL LOW (ref 4.22–5.81)
RDW: 14.8 % (ref 11.5–15.5)
WBC Morphology: ABNORMAL
WBC: 168.5 10*3/uL (ref 4.0–10.5)
nRBC: 0 % (ref 0.0–0.2)

## 2021-02-21 MED ORDER — FENTANYL CITRATE (PF) 100 MCG/2ML IJ SOLN
INTRAMUSCULAR | Status: AC
  Filled 2021-02-21: qty 2

## 2021-02-21 MED ORDER — MIDAZOLAM HCL 2 MG/2ML IJ SOLN
INTRAMUSCULAR | Status: AC | PRN
  Administered 2021-02-21 (×2): 1 mg via INTRAVENOUS

## 2021-02-21 MED ORDER — MIDAZOLAM HCL 2 MG/2ML IJ SOLN
INTRAMUSCULAR | Status: AC
  Filled 2021-02-21: qty 4

## 2021-02-21 MED ORDER — LIDOCAINE HCL (PF) 1 % IJ SOLN
INTRAMUSCULAR | Status: AC | PRN
  Administered 2021-02-21: 10 mL

## 2021-02-21 MED ORDER — SODIUM CHLORIDE 0.9 % IV SOLN: INTRAVENOUS | Status: DC

## 2021-02-21 MED ORDER — FENTANYL CITRATE (PF) 100 MCG/2ML IJ SOLN
INTRAMUSCULAR | Status: AC | PRN
  Administered 2021-02-21 (×2): 50 ug via INTRAVENOUS

## 2021-02-21 NOTE — Consult Note (Signed)
Chief Complaint: Patient was seen in consultation today for CT guided bone marrow biopsy  Referring Physician(s): Bonne Terre T IV  Supervising Physician: Corrie Mckusick  Patient Status: [facility] - Out-pt  History of Present Illness: Keith Jordan is a 80 y.o. male with past medical history of hyperlipidemia, prediabetes, osteoarthritis was seen recently by his primary care physician for nasal congestion and facial pressure as well as chest congestion/phlegm production.  CBC ordered at the time revealed white blood cell count of 186.3.  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  Recent CT chest abdomen pelvis on 2/16 revealed marked splenomegaly without splenic lesions or infarct, right renal calculus but no obstructing ureteral calculi or bladder calculi, enlarged prostate gland , advanced sigmoid diverticulosis, and stable right middle lobe pulmonary nodule.  He is now being referred by oncology for CT-guided bone marrow biopsy for further evaluation.  Past Medical History:  Diagnosis Date  . Hyperlipidemia   . Ischemic optic neuropathy 2006   [place] at [facility]  . Personal history of colonic adenoma 07/30/2008  . Prediabetes 07/05/2016   A1c 6.5% 03/2014   . Primary osteoarthritis of knee    bilateral s/p L knee replacement, receives R knee injections Francis Gaines)    Past Surgical History:  Procedure Laterality Date  . COLONOSCOPY  12/2013   no polyps, no rpt due [name])  . CYST EXCISION  01/2020  . TOTAL KNEE ARTHROPLASTY Left 2005   [name]    Allergies: Patient has no known allergies.  Medications: Prior to Admission medications   Medication Sig Start Date End Date Taking? Authorizing Provider  acetaminophen (TYLENOL) 500 MG tablet Take 500 mg by mouth every 6 (six) hours as needed for mild pain.   Yes  [provider]  Ascorbic Acid (VITAMIN C) 1000 MG tablet Take 500 mg by mouth 3 (three) times a week.   Yes [provider]  aspirin EC 81 MG tablet Take 1 tablet (81 mg total) by mouth 2 (two) times a week. Patient taking differently: Take 81 mg by mouth 3 (three) times a week. 10/14/20  Yes [name], MD  atorvastatin (LIPITOR) 10 MG tablet Take 1 tablet (10 mg total) by mouth daily. 10/11/20  Yes [name], MD  carboxymethylcellulose (REFRESH PLUS) 0.5 % SOLN Place 1 drop into both eyes 2 (two) times daily as needed (dry eyes).   Yes [provider]  Multiple Vitamins-Minerals (PRESERVISION AREDS) CAPS Take 1 capsule by mouth in the morning and at bedtime. 10/11/20  Yes [name], MD  sodium chloride (OCEAN) 0.65 % SOLN nasal spray Place 1 spray into both nostrils as needed for congestion.   Yes [provider]  amoxicillin-clavulanate (AUGMENTIN) 875-125 MG tablet Take 1 tablet by mouth 2 (two) times daily. 01/29/21   Tower, Wynelle Fanny, MD     Family History  Problem Relation Age of Onset  . Blindness Mother 90       ?stroke in eyes  . Lung disease Father        Ecologist  . Arthritis Father   . Colon cancer Neg Hx   . Pancreatic cancer Neg Hx   . Stomach cancer Neg Hx   . Esophageal cancer Neg Hx   . Rectal cancer Neg Hx   . Cancer Neg Hx   . Diabetes Neg Hx   .  CAD Neg Hx   . Stroke Neg Hx     Social History   Socioeconomic History  . Marital status: Married    Spouse name: Not on file  . Number of children: Not on file  . Years of education: Not on file  . Highest education level: Not on file  Occupational History  . Not on file  Tobacco Use  . Smoking status: Never Smoker  . Smokeless tobacco: Never Used  Vaping Use  . Vaping Use: Never used  Substance and Sexual Activity  . Alcohol use: Yes    Comment: occasional  . Drug use: No  . Sexual activity: Not Currently  Other Topics Concern  . Not on file   Social History Narrative   Lives with wife, 2 cats   Occ: retired Personal assistant   Activity: works in yard   Diet: some water, fruits/vegetables daily   Social Determinants of Radio broadcast assistant Strain: [place]   . Difficulty of Paying Living Expenses: Not hard at all  Food Insecurity: No Food Insecurity  . Worried About Charity fundraiser in the Last Year: Never true  . Ran Out of Food in the Last Year: Never true  Transportation Needs: No Transportation Needs  . Lack of Transportation (Medical): No  . Lack of Transportation (Non-Medical): No  Physical Activity: Inactive  . Days of Exercise per Week: 0 days  . Minutes of Exercise per Session: 0 min  Stress: No Stress Concern Present  . Feeling of Stress : Not at all  Social Connections: Not on file      Review of Systems denies fever, chest pain, dyspnea, cough, abdominal/back pain, nausea, vomiting; he has had a "head cold" and some occasional night sweats.  Vital Signs: BP (!) 149/67   Pulse 78   Temp 98.1 F (36.7 C) (Oral)   Resp 16   Ht 5' 8"  (1.727 m)   Wt 151 lb (68.5 kg)   SpO2 99%   BMI 22.96 kg/m   Physical Exam awake, alert.  Chest with distant but clear breath sounds bilaterally.  Heart with regular rate and rhythm.  Abdomen soft, positive bowel sounds, nontender, moderate splenomegaly.  No lower extremity edema.  Imaging: DG Chest 2 View  Result Date: 01/30/2021 CLINICAL DATA:  Chest congestion and palpitations. EXAM: CHEST - 2 VIEW COMPARISON:  Chest x-ray dated October 11, 2020. FINDINGS: The heart size and mediastinal contours are within normal limits. Both lungs are clear. The visualized skeletal structures are unremarkable. IMPRESSION: No active cardiopulmonary disease. Electronically Signed   By: Titus Dubin M.D.   On: 01/30/2021 11:32   CT CHEST ABDOMEN PELVIS W CONTRAST  Result Date: 02/13/2021 CLINICAL DATA:  Splenic marginal zone lymphoma. EXAM: CT CHEST, ABDOMEN, AND PELVIS WITH  CONTRAST TECHNIQUE: Multidetector CT imaging of the chest, abdomen and pelvis was performed following the standard protocol during bolus administration of intravenous contrast. CONTRAST:  176m OMNIPAQUE IOHEXOL 300 MG/ML  SOLN COMPARISON:  02/23/2008 FINDINGS: CT CHEST FINDINGS Cardiovascular: The heart is normal in size. No pericardial effusion. The aorta is normal in caliber. Mild tortuosity and scattered calcifications at the aortic arch. The branch vessels are patent. Coronary artery calcifications are noted. Mediastinum/Nodes: No mediastinal or hilar mass or lymphadenopathy. The esophagus is unremarkable. Lungs/Pleura: No acute pulmonary findings. No worrisome pulmonary lesions. 4.5 mm subpleural nodule in the right middle lobe on image 112/4 is stable and considered benign. No pleural effusion or pleural nodules. Musculoskeletal: No  chest wall mass, supraclavicular or axillary adenopathy. Small scattered benign-appearing nodes are noted. The bony thorax is intact. No lytic or sclerotic bone lesions are identified. CT ABDOMEN PELVIS FINDINGS Hepatobiliary: No hepatic lesions or intrahepatic biliary dilatation. The gallbladder is unremarkable. No common bile duct dilatation. Pancreas: No mass, inflammation or ductal dilatation. Spleen: Marked splenomegaly. The spleen measures 19 x 15 x 10 cm. Accessory spleens are noted. No splenic lesions or evidence of splenic infarct. Adrenals/Urinary Tract: The adrenal glands are unremarkable. There is a right renal calculus but no obstructing ureteral calculi. No worrisome renal or bladder lesions are identified. Stomach/Bowel: The stomach, duodenum, small bowel and colon are unremarkable. No acute inflammatory changes, mass lesions or obstructive findings. A small hiatal hernia is noted. The terminal ileum and appendix are normal. Advanced sigmoid colon diverticulosis but no findings for acute diverticulitis. Vascular/Lymphatic: Atherosclerotic calcifications involving  the aorta and iliac arteries but no aneurysm or dissection. The branch vessels are patent. The major venous structures are patent. No abdominal/pelvic lymphadenopathy. Small scattered lymph nodes are noted. Reproductive: Enlarged prostate gland with median lobe hypertrophy impressing on the base of the bladder. The seminal vesicles are unremarkable. Other: No pelvic mass or adenopathy. No free pelvic fluid collections. No inguinal mass or adenopathy. No abdominal wall hernia or subcutaneous lesions. Musculoskeletal: No significant bony findings. IMPRESSION: 1. Marked splenomegaly.  No splenic lesions or splenic infarct. 2. No adenopathy in the chest, abdomen or pelvis. 3. Right renal calculus but no obstructing ureteral calculi or bladder calculi. 4. Enlarged prostate gland with median lobe hypertrophy impressing on the base of the bladder. 5. Advanced sigmoid colon diverticulosis but no findings for acute diverticulitis. 6. Stable 4.5 mm right middle lobe pulmonary nodule, considered benign. 7. Aortic atherosclerosis. Aortic Atherosclerosis (ICD10-I70.0). Electronically Signed   By: Marijo Sanes M.D.   On: 02/13/2021 11:00    Labs:  CBC: Recent Labs    01/30/21 1204 01/30/21 1418 02/05/21 1023  WBC 186.3* 147.9* 173.3*  HGB 13.5 12.4* 12.9*  HCT 41.2 37.5* 40.7  PLT 132* 123* 136*    COAGS: No results for input(s): INR, APTT in the last 8760 hours.  BMP: Recent Labs    10/03/20 1115 01/30/21 1204 01/30/21 1418 02/05/21 1023  NA 143 140 140 140  K 4.3 5.2* 4.1 4.4  CL 102 102 102 104  CO2 34* 28 27 29   GLUCOSE 110* 130* 169* 145*  BUN 24* 28* 29* 25*  CALCIUM 9.1 9.5 9.2 9.5  CREATININE 1.10 1.22 1.19 1.18  GFRNONAA  --  >60 >60 >60    LIVER FUNCTION TESTS: Recent Labs    10/03/20 1115 01/30/21 1607 02/05/21 1023  BILITOT 0.8 0.9 0.6  AST 22 32 31  ALT 10 15 15   ALKPHOS 81 68 96  PROT 6.3 6.4* 7.2  ALBUMIN 3.9 3.7 4.2    TUMOR MARKERS: No results for input(s):  AFPTM, CEA, CA199, CHROMGRNA in the last 8760 hours.  Assessment and Plan: 80 y.o. male with past medical history of hyperlipidemia, prediabetes, osteoarthritis was seen recently by his primary care physician for nasal congestion and facial pressure as well as chest congestion/phlegm production.  CBC ordered at the time revealed white blood cell count of 186.3.  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  Recent CT chest abdomen pelvis on 2/16 revealed marked splenomegaly without splenic  lesions or infarct, right renal calculus but no obstructing ureteral calculi or bladder calculi, enlarged prostate gland , advanced sigmoid diverticulosis, and stable right middle lobe pulmonary nodule.  He is now being referred by oncology for CT-guided bone marrow biopsy for further evaluation.Risks and benefits of procedure was discussed with the patient  including, but not limited to bleeding, infection, damage to adjacent structures or low yield requiring additional tests.  All of the questions were answered and there is agreement to proceed.  Consent signed and in chart.     Thank you for this interesting consult.  I greatly enjoyed meeting ABISAI DEER and look forward to participating in their care.  A copy of this report was sent to the requesting provider on this date.  Electronically Signed: D. Rowe Robert, PA-C 02/21/2021, 9:38 AM   I spent a total of 20 minutes    in face to face in clinical consultation, greater than 50% of which was counseling/coordinating care for CT-guided bone marrow biopsy

## 2021-02-21 NOTE — Discharge Instructions (Signed)
Bone Marrow Aspiration and Bone Marrow Biopsy, Adult, Care After This sheet gives you information about how to care for yourself after your procedure. Your health care provider may also give you more specific instructions. If you have problems or questions, contact your health care provider. What can I expect after the procedure? After the procedure, it is common to have:  Mild pain and tenderness.  Swelling.  Bruising. Follow these instructions at home: Puncture site care  Follow instructions from your health care provider about how to take care of the puncture site. Make sure you: ? Wash your hands with soap and water before and after you change your bandage (dressing). If soap and water are not available, use hand sanitizer. ? Change your dressing as told by your health care provider.  Check your puncture site every day for signs of infection. Check for: ? More redness, swelling, or pain. ? Fluid or blood. ? Warmth. ? Pus or a bad smell.   Activity  Return to your normal activities as told by your health care provider. Ask your health care provider what activities are safe for you.  Do not lift anything that is heavier than 10 lb (4.5 kg), or the limit that you are told, until your health care provider says that it is safe.  Do not drive for 24 hours if you were given a sedative during your procedure. General instructions  Take over-the-counter and prescription medicines only as told by your health care provider.  Do not take baths, swim, or use a hot tub until your health care provider approves. Ask your health care provider if you may take showers. You may only be allowed to take sponge baths.  If directed, put ice on the affected area. To do this: ? Put ice in a plastic bag. ? Place a towel between your skin and the bag. ? Leave the ice on for 20 minutes, 2-3 times a day.  Keep all follow-up visits as told by your health care provider. This is important.   Contact a  health care provider if:  Your pain is not controlled with medicine.  You have a fever.  You have more redness, swelling, or pain around the puncture site.  You have fluid or blood coming from the puncture site.  Your puncture site feels warm to the touch.  You have pus or a bad smell coming from the puncture site. Summary  After the procedure, it is common to have mild pain, tenderness, swelling, and bruising.  Follow instructions from your health care provider about how to take care of the puncture site and what activities are safe for you.  Take over-the-counter and prescription medicines only as told by your health care provider.  Contact a health care provider if you have any signs of infection, such as fluid or blood coming from the puncture site. This information is not intended to replace advice given to you by your health care provider. Make sure you discuss any questions you have with your health care provider.    Moderate Conscious Sedation, Adult, Care After This sheet gives you information about how to care for yourself after your procedure. Your health care provider may also give you more specific instructions. If you have problems or questions, contact your health care provider. What can I expect after the procedure? After the procedure, it is common to have:  Sleepiness for several hours.  Impaired judgment for several hours.  Difficulty with balance.  Vomiting if you eat   too soon. Follow these instructions at home: For the time period you were told by your health care provider:  Rest.  Do not participate in activities where you could fall or become injured.  Do not drive or use machinery.  Do not drink alcohol.  Do not take sleeping pills or medicines that cause drowsiness.  Do not make important decisions or sign legal documents.  Do not take care of children on your own.      Eating and drinking  Follow the diet recommended by your health  care provider.  Drink enough fluid to keep your urine pale yellow.  If you vomit: ? Drink water, juice, or soup when you can drink without vomiting. ? Make sure you have little or no nausea before eating solid foods.   General instructions  Take over-the-counter and prescription medicines only as told by your health care provider.  Have a responsible adult stay with you for the time you are told. It is important to have someone help care for you until you are awake and alert.  Do not smoke.  Keep all follow-up visits as told by your health care provider. This is important. Contact a health care provider if:  You are still sleepy or having trouble with balance after 24 hours.  You feel light-headed.  You keep feeling nauseous or you keep vomiting.  You develop a rash.  You have a fever.  You have redness or swelling around the IV site. Get help right away if:  You have trouble breathing.  You have new-onset confusion at home. Summary  After the procedure, it is common to feel sleepy, have impaired judgment, or feel nauseous if you eat too soon.  Rest after you get home. Know the things you should not do after the procedure.  Follow the diet recommended by your health care provider and drink enough fluid to keep your urine pale yellow.  Get help right away if you have trouble breathing or new-onset confusion at home. This information is not intended to replace advice given to you by your health care provider. Make sure you discuss any questions you have with your health care provider. Document Revised: 04/12/2020 Document Reviewed: 11/09/2019 Elsevier Patient Education  2021 [employer]  

## 2021-02-21 NOTE — Procedures (Signed)
Interventional Radiology Procedure Note ? ?Procedure: CT guided aspirate and core biopsy of right iliac bone ?Complications: None ?Recommendations: ?- Bedrest supine x 1 hrs ?- OTC's PRN  Pain ?- Follow biopsy results ? ?Signed, ? ?Skyleen Bentley S. Nycere Presley, DO ? ? ?

## 2021-02-23 ENCOUNTER — Other Ambulatory Visit: Payer: Self-pay | Admitting: Hematology and Oncology

## 2021-02-23 DIAGNOSIS — D72820 Lymphocytosis (symptomatic): Secondary | ICD-10-CM

## 2021-02-23 NOTE — Progress Notes (Signed)
[place] Telephone:([phone]) [phone]   Fax:(336) [phone]  PROGRESS NOTE  Patient Care Team: [name], MD as PCP - General (Family Medicine) Specialists, [facility] as Consulting Physician (Orthopedic Surgery)  Hematological/Oncological History # Splenic Marginal Zone Lymphoma 01/30/2021: WBC 186.3, Hgb 13.5, MCV 96.3, Plt 132. Referred to Emergency Department. 02/05/2021: establish care with Dr. Lorenso Courier  02/24/2021: Week 1 of Monotherapy Rituximab  Interval History:  Keith Jordan 80 y.o. male with medical history significant for splenic marginal zone lymphoma who presents for a follow up visit. The patient's last visit was on 02/05/2021. In the interim since the last visit he has completed his bone marrow biopsy and CT C/A/P which confirms only splenic involvement of his lymphoma.   On exam today Keith Jordan notes that he tolerated the bone marrow biopsy well without any difficulty.  He is not have any bleeding, bruising on his backside.  Otherwise he has not been having any issues with fevers, chills, sweats, nausea, vomiting or diarrhea.  He has had no other major changes in his health since our last discussion.  He notes that he does have some symptoms concerning for a head cold with some runny nose and ear popping with nasal congestion, but no other infectious symptoms such as sore throat or cough.  A full 10 point ROS is listed below.  MEDICAL HISTORY:  Past Medical History:  Diagnosis Date  . Hyperlipidemia   . Ischemic optic neuropathy 2006   [place] [facility]  . Personal history of colonic adenoma 07/30/2008  . Prediabetes 07/05/2016   A1c 6.5% 03/2014   . Primary osteoarthritis of knee    bilateral s/p L knee replacement, receives R knee injections [name], Noemi Chapel)    SURGICAL HISTORY: Past Surgical History:  Procedure Laterality Date  . COLONOSCOPY  12/2013   no polyps, no rpt due [name])  . CYST EXCISION  01/2020  . TOTAL KNEE  ARTHROPLASTY Left 2005   [name]    SOCIAL HISTORY: Social History   Socioeconomic History  . Marital status: Married    Spouse name: Not on file  . Number of children: Not on file  . Years of education: Not on file  . Highest education level: Not on file  Occupational History  . Not on file  Tobacco Use  . Smoking status: Never Smoker  . Smokeless tobacco: Never Used  Vaping Use  . Vaping Use: Never used  Substance and Sexual Activity  . Alcohol use: Yes    Comment: occasional  . Drug use: No  . Sexual activity: Not Currently  Other Topics Concern  . Not on file  Social History Narrative   Lives with wife, 2 cats   Occ: retired Personal assistant   Activity: works in yard   Diet: some water, fruits/vegetables daily   Social Determinants of Radio broadcast assistant Strain: [place]   . Difficulty of Paying Living Expenses: Not hard at all  Food Insecurity: No Food Insecurity  . Worried About Charity fundraiser in the Last Year: Never true  . Ran Out of Food in the Last Year: Never true  Transportation Needs: No Transportation Needs  . Lack of Transportation (Medical): No  . Lack of Transportation (Non-Medical): No  Physical Activity: Inactive  . Days of Exercise per Week: 0 days  . Minutes of Exercise per Session: 0 min  Stress: No Stress Concern Present  . Feeling of Stress : Not at all  Social Connections:  Not on file  Intimate Partner Violence: Not At Risk  . Fear of Current or Ex-Partner: No  . Emotionally Abused: No  . Physically Abused: No  . Sexually Abused: No    FAMILY HISTORY: Family History  Problem Relation Age of Onset  . Blindness Mother 73       ?stroke in eyes  . Lung disease Father        Ecologist  . Arthritis Father   . Colon cancer Neg Hx   . Pancreatic cancer Neg Hx   . Stomach cancer Neg Hx   . Esophageal cancer Neg Hx   . Rectal cancer Neg Hx   . Cancer Neg Hx   . Diabetes Neg Hx   . CAD Neg Hx   . Stroke Neg Hx     ALLERGIES:   has No Known Allergies.  MEDICATIONS:  Current Outpatient Medications  Medication Sig Dispense Refill  . allopurinol (ZYLOPRIM) 300 MG tablet Take 1 tablet (300 mg total) by mouth daily. 30 tablet 1  . acetaminophen (TYLENOL) 500 MG tablet Take 500 mg by mouth every 6 (six) hours as needed for mild pain.    . Ascorbic Acid (VITAMIN C) 1000 MG tablet Take 500 mg by mouth 3 (three) times a week.    Marland Kitchen aspirin EC 81 MG tablet Take 1 tablet (81 mg total) by mouth 2 (two) times a week. (Patient taking differently: Take 81 mg by mouth 3 (three) times a week.)    . atorvastatin (LIPITOR) 10 MG tablet Take 1 tablet (10 mg total) by mouth daily. 90 tablet 3  . carboxymethylcellulose (REFRESH PLUS) 0.5 % SOLN Place 1 drop into both eyes 2 (two) times daily as needed (dry eyes).    . Multiple Vitamins-Minerals (PRESERVISION AREDS) CAPS Take 1 capsule by mouth in the morning and at bedtime. 180 capsule 3  . sodium chloride (OCEAN) 0.65 % SOLN nasal spray Place 1 spray into both nostrils as needed for congestion.     No current facility-administered medications for this visit.    REVIEW OF SYSTEMS:   Constitutional: ( - ) fevers, ( - )  chills , ( - ) night sweats Eyes: ( - ) blurriness of vision, ( - ) double vision, ( - ) watery eyes Ears, nose, mouth, throat, and face: ( - ) mucositis, ( - ) sore throat Respiratory: ( - ) cough, ( - ) dyspnea, ( - ) wheezes Cardiovascular: ( - ) palpitation, ( - ) chest discomfort, ( - ) lower extremity swelling Gastrointestinal:  ( - ) nausea, ( - ) heartburn, ( - ) change in bowel habits Skin: ( - ) abnormal skin rashes Lymphatics: ( - ) new lymphadenopathy, ( - ) easy bruising Neurological: ( - ) numbness, ( - ) tingling, ( - ) new weaknesses Behavioral/Psych: ( - ) mood change, ( - ) new changes  All other systems were reviewed with the patient and are negative.  PHYSICAL EXAMINATION: ECOG PERFORMANCE STATUS: 1 - Symptomatic but completely  ambulatory  Vitals:   02/24/21 1105  BP: (!) 147/67  Pulse: 72  Resp: 17  Temp: 98.3 F (36.8 C)  SpO2: 99%   Filed Weights   02/24/21 1105  Weight: 150 lb (68 kg)    GENERAL: well appearing elderly Caucasian male alert, no distress and comfortable SKIN: skin color, texture, turgor are normal, no rashes or significant lesions EYES: conjunctiva are pink and non-injected, sclera clear LUNGS: clear to auscultation and  percussion with normal breathing effort HEART: regular rate & rhythm and no murmurs and no lower extremity edema Musculoskeletal: no cyanosis of digits and no clubbing  PSYCH: alert & oriented x 3, fluent speech NEURO: no focal motor/sensory deficits  LABORATORY DATA:  I have reviewed the data as listed CBC Latest Ref Rng & Units 02/24/2021 02/21/2021 02/05/2021  WBC 4.0 - 10.5 K/uL 165.8(HH) 168.5(HH) 173.3(HH)  Hemoglobin 13.0 - 17.0 g/dL 12.5(L) 12.8(L) 12.9(L)  Hematocrit 39.0 - 52.0 % 39.5 40.4 40.7  Platelets 150 - 400 K/uL 120(L) 136(L) 136(L)    CMP Latest Ref Rng & Units 02/24/2021 02/05/2021 01/30/2021  Glucose 70 - 99 mg/dL 156(H) 145(H) -  BUN 8 - 23 mg/dL 23 25(H) -  Creatinine 0.61 - 1.24 mg/dL 1.16 1.18 -  Sodium 135 - 145 mmol/L 141 140 -  Potassium 3.5 - 5.1 mmol/L 4.0 4.4 -  Chloride 98 - 111 mmol/L 105 104 -  CO2 22 - 32 mmol/L 27 29 -  Calcium 8.9 - 10.3 mg/dL 9.4 9.5 -  Total Protein 6.5 - 8.1 g/dL 7.0 7.2 6.4(L)  Total Bilirubin 0.3 - 1.2 mg/dL 0.6 0.6 0.9  Alkaline Phos 38 - 126 U/L 89 96 68  AST 15 - 41 U/L 24 31 32  ALT 0 - 44 U/L 10 15 15    RADIOGRAPHIC STUDIES: CT CHEST ABDOMEN PELVIS W CONTRAST  Result Date: 02/13/2021 CLINICAL DATA:  Splenic marginal zone lymphoma. EXAM: CT CHEST, ABDOMEN, AND PELVIS WITH CONTRAST TECHNIQUE: Multidetector CT imaging of the chest, abdomen and pelvis was performed following the standard protocol during bolus administration of intravenous contrast. CONTRAST:  124m OMNIPAQUE IOHEXOL 300 MG/ML  SOLN  COMPARISON:  02/23/2008 FINDINGS: CT CHEST FINDINGS Cardiovascular: The heart is normal in size. No pericardial effusion. The aorta is normal in caliber. Mild tortuosity and scattered calcifications at the aortic arch. The branch vessels are patent. Coronary artery calcifications are noted. Mediastinum/Nodes: No mediastinal or hilar mass or lymphadenopathy. The esophagus is unremarkable. Lungs/Pleura: No acute pulmonary findings. No worrisome pulmonary lesions. 4.5 mm subpleural nodule in the right middle lobe on image 112/4 is stable and considered benign. No pleural effusion or pleural nodules. Musculoskeletal: No chest wall mass, supraclavicular or axillary adenopathy. Small scattered benign-appearing nodes are noted. The bony thorax is intact. No lytic or sclerotic bone lesions are identified. CT ABDOMEN PELVIS FINDINGS Hepatobiliary: No hepatic lesions or intrahepatic biliary dilatation. The gallbladder is unremarkable. No common bile duct dilatation. Pancreas: No mass, inflammation or ductal dilatation. Spleen: Marked splenomegaly. The spleen measures 19 x 15 x 10 cm. Accessory spleens are noted. No splenic lesions or evidence of splenic infarct. Adrenals/Urinary Tract: The adrenal glands are unremarkable. There is a right renal calculus but no obstructing ureteral calculi. No worrisome renal or bladder lesions are identified. Stomach/Bowel: The stomach, duodenum, small bowel and colon are unremarkable. No acute inflammatory changes, mass lesions or obstructive findings. A small hiatal hernia is noted. The terminal ileum and appendix are normal. Advanced sigmoid colon diverticulosis but no findings for acute diverticulitis. Vascular/Lymphatic: Atherosclerotic calcifications involving the aorta and iliac arteries but no aneurysm or dissection. The branch vessels are patent. The major venous structures are patent. No abdominal/pelvic lymphadenopathy. Small scattered lymph nodes are noted. Reproductive: Enlarged  prostate gland with median lobe hypertrophy impressing on the base of the bladder. The seminal vesicles are unremarkable. Other: No pelvic mass or adenopathy. No free pelvic fluid collections. No inguinal mass or adenopathy. No abdominal wall hernia or subcutaneous  lesions. Musculoskeletal: No significant bony findings. IMPRESSION: 1. Marked splenomegaly.  No splenic lesions or splenic infarct. 2. No adenopathy in the chest, abdomen or pelvis. 3. Right renal calculus but no obstructing ureteral calculi or bladder calculi. 4. Enlarged prostate gland with median lobe hypertrophy impressing on the base of the bladder. 5. Advanced sigmoid colon diverticulosis but no findings for acute diverticulitis. 6. Stable 4.5 mm right middle lobe pulmonary nodule, considered benign. 7. Aortic atherosclerosis. Aortic Atherosclerosis (ICD10-I70.0). Electronically Signed   By: Marijo Sanes M.D.   On: 02/13/2021 11:00   CT Biopsy  Result Date: 02/21/2021 INDICATION: 80 year old male with a history lymphoma referred for bone marrow biopsy EXAM: CT BONE MARROW BIOPSY AND ASPIRATION; CT BIOPSY MEDICATIONS: None. ANESTHESIA/SEDATION: Moderate (conscious) sedation was employed during this procedure. A total of Versed 2.0 mg and Fentanyl 100 mcg was administered intravenously. Moderate Sedation Time: 10 minutes. The patient's level of consciousness and vital signs were monitored continuously by radiology nursing throughout the procedure under my direct supervision. FLUOROSCOPY TIME:  CT COMPLICATIONS: None PROCEDURE: The procedure risks, benefits, and alternatives were explained to the patient. Questions regarding the procedure were encouraged and answered. The patient understands and consents to the procedure. Scout CT of the pelvis was performed for surgical planning purposes. The right posterior pelvis was prepped with Chlorhexidine in a sterile fashion, and a sterile drape was applied covering the operative field. A sterile gown and  sterile gloves were used for the procedure. Local anesthesia was provided with 1% Lidocaine. Right posterior iliac bone was targeted for biopsy. The skin and subcutaneous tissues were infiltrated with 1% lidocaine without epinephrine. A small stab incision was made with an 11 blade scalpel, and an 11 gauge Murphy needle was advanced with CT guidance to the posterior cortex. Manual forced was used to advance the needle through the posterior cortex and the stylet was removed. A bone marrow aspirate was retrieved and passed to a cytotechnologist in the room. The Murphy needle was then advanced without the stylet for a core biopsy. The core biopsy was retrieved and also passed to a cytotechnologist. Manual pressure was used for hemostasis and a sterile dressing was placed. No complications were encountered no significant blood loss was encountered. Patient tolerated the procedure well and remained hemodynamically stable throughout. IMPRESSION: Status post CT-guided bone marrow biopsy, with tissue specimen sent to pathology for complete histopathologic analysis Signed, Dulcy Fanny. Earleen Newport, DO [facility] Electronically Signed   By: Corrie Mckusick D.O.   On: 02/21/2021 12:27   CT BONE MARROW BIOPSY & ASPIRATION  Result Date: 02/21/2021 INDICATION: 80 year old male with a history lymphoma referred for bone marrow biopsy EXAM: CT BONE MARROW BIOPSY AND ASPIRATION; CT BIOPSY MEDICATIONS: None. ANESTHESIA/SEDATION: Moderate (conscious) sedation was employed during this procedure. A total of Versed 2.0 mg and Fentanyl 100 mcg was administered intravenously. Moderate Sedation Time: 10 minutes. The patient's level of consciousness and vital signs were monitored continuously by radiology nursing throughout the procedure under my direct supervision. FLUOROSCOPY TIME:  CT COMPLICATIONS: None PROCEDURE: The procedure risks, benefits, and alternatives were explained to the  patient. Questions regarding the procedure were encouraged and answered. The patient understands and consents to the procedure. Scout CT of the pelvis was performed for surgical planning purposes. The right posterior pelvis was prepped with Chlorhexidine in a sterile fashion, and a sterile drape was applied covering the operative field. A sterile gown and sterile gloves were used for the procedure. Local anesthesia  was provided with 1% Lidocaine. Right posterior iliac bone was targeted for biopsy. The skin and subcutaneous tissues were infiltrated with 1% lidocaine without epinephrine. A small stab incision was made with an 11 blade scalpel, and an 11 gauge Murphy needle was advanced with CT guidance to the posterior cortex. Manual forced was used to advance the needle through the posterior cortex and the stylet was removed. A bone marrow aspirate was retrieved and passed to a cytotechnologist in the room. The Murphy needle was then advanced without the stylet for a core biopsy. The core biopsy was retrieved and also passed to a cytotechnologist. Manual pressure was used for hemostasis and a sterile dressing was placed. No complications were encountered no significant blood loss was encountered. Patient tolerated the procedure well and remained hemodynamically stable throughout. IMPRESSION: Status post CT-guided bone marrow biopsy, with tissue specimen sent to pathology for complete histopathologic analysis Signed, Dulcy Fanny. Earleen Newport, DO [facility] Electronically Signed   By: Corrie Mckusick D.O.   On: 02/21/2021 12:27   VAS [place] LOWER EXTREMITY VENOUS (DVT)  Result Date: 02/27/2021  Lower Venous DVT Study Indications: Swelling.  Risk Factors: Cancer. Comparison Study: No prior studies. Performing Technologist: Oliver Hum RVT  Examination Guidelines: A complete evaluation includes B-mode imaging, spectral Doppler, color Doppler, and power Doppler as needed  of all accessible portions of each vessel. Bilateral testing is considered an integral part of a complete examination. Limited examinations for reoccurring indications may be performed as noted. The reflux portion of the exam is performed with the patient in reverse Trendelenburg.  +-----+---------------+---------+-----------+----------+--------------+ RIGHTCompressibilityPhasicitySpontaneityPropertiesThrombus Aging +-----+---------------+---------+-----------+----------+--------------+ CFV  Full           Yes      Yes                                 +-----+---------------+---------+-----------+----------+--------------+   +---------+---------------+---------+-----------+----------+--------------+ LEFT     CompressibilityPhasicitySpontaneityPropertiesThrombus Aging +---------+---------------+---------+-----------+----------+--------------+ CFV      Full           Yes      Yes                                 +---------+---------------+---------+-----------+----------+--------------+ SFJ      Full                                                        +---------+---------------+---------+-----------+----------+--------------+ FV Prox  Full                                                        +---------+---------------+---------+-----------+----------+--------------+ FV Mid   Full                                                        +---------+---------------+---------+-----------+----------+--------------+ FV DistalFull                                                        +---------+---------------+---------+-----------+----------+--------------+  PFV      Full                                                        +---------+---------------+---------+-----------+----------+--------------+ Jordan      Full           Yes      Yes                                 +---------+---------------+---------+-----------+----------+--------------+ PTV       Full                                                        +---------+---------------+---------+-----------+----------+--------------+ PERO     Full                                                        +---------+---------------+---------+-----------+----------+--------------+     Summary: RIGHT: - No evidence of common femoral vein obstruction.  LEFT: - There is no evidence of deep vein thrombosis in the lower extremity.  - No cystic structure found in the popliteal fossa.  *See table(s) above for measurements and observations. Electronically signed by Monica Martinez MD on 02/27/2021 at 3:48:37 PM.    Final     ASSESSMENT & PLAN Keith Jordan 80 y.o. male with medical history significant for splenic marginal zone lymphoma who presents for a follow up visit.   I have reviewed labs, the records, schedule the patient the findings most consistent with a splenic marginal zone lymphoma.  This is confirmed with a CT chest abdomen pelvis which showed only splenic involvement and a bone marrow biopsy which confirmed the diagnosis.  The patient has only leukocytosis and splenomegaly.  As such we will proceed with monotherapy rituximab 375 mg per metered squared q. 7 days x 4 doses.  We will continue to monitor him closely in clinic while he is receiving this treatment.  Today is week 1 of monotherapy rituximab.  We will plan to see him back in clinic for week 3 in 2 weeks time.  #Splenic Marginal Zone Lymphoma --findings are consistent with a splenic marginal zone lymphoma. --patient completed bone marrow biopsy and a CT chest abdomen pelvis. Disease appear limited to peripheral blood and spleen.  --Treatment of choice with confirmed splenic marginal zone lymphoma is rituximab 375 mg/m q. 7 days x 4 doses. --today is Week 1 of monotherapy rituximab --RTC in 2 weeks with interval Week 2 of treatment.   No orders of the defined types were placed in this encounter.   All questions were  answered. The patient knows to call the clinic with any problems, questions or concerns.  A total of more than 30 minutes were spent on this encounter and over half of that time was spent on counseling and coordination of care as outlined above.   Ledell Peoples, MD Department of Hematology/Oncology [facility] at [facility]  Hospital Phone: [phone] Pager: [phone] Email: [email or web address]  03/02/2021 12:17 PM   Literature Support:  Kalpadakis C, Pangalis GA, Dimopoulou MN, Vassilakopoulos TP, Kyrtsonis MC, Korkolopoulou P, Kontopidou FN, Siakantaris MP, Dimitriadou EM, Kokoris SI, Tsaftaridis P, [place], Angelopoulou [place]. Rituximab monotherapy is highly effective in splenic marginal zone lymphoma. Hematol Oncol. 2007 Sep;25(3):127-31.  --The overall response rate was 100%. After treatment, all patients had a complete resolution of splenomegaly along with restoration of their blood counts. Eleven patients (69%) achieved a CR, three (19%) unconfirmed CR and two (12%) a PR. Among the complete responders seven patients had also a molecular remission.

## 2021-02-24 ENCOUNTER — Other Ambulatory Visit: Payer: Self-pay

## 2021-02-24 ENCOUNTER — Inpatient Hospital Stay: Payer: Medicare HMO

## 2021-02-24 ENCOUNTER — Encounter: Payer: Self-pay | Admitting: Hematology and Oncology

## 2021-02-24 ENCOUNTER — Ambulatory Visit (HOSPITAL_BASED_OUTPATIENT_CLINIC_OR_DEPARTMENT_OTHER): Payer: Medicare HMO | Admitting: Medical

## 2021-02-24 ENCOUNTER — Inpatient Hospital Stay: Payer: Medicare HMO | Admitting: Hematology and Oncology

## 2021-02-24 VITALS — BP 147/67 | HR 72 | Temp 98.3°F | Resp 17 | Ht 68.0 in | Wt 150.0 lb

## 2021-02-24 VITALS — BP 123/55 | HR 105 | Temp 98.0°F | Resp 20

## 2021-02-24 DIAGNOSIS — D72820 Lymphocytosis (symptomatic): Secondary | ICD-10-CM

## 2021-02-24 DIAGNOSIS — C8307 Small cell B-cell lymphoma, spleen: Secondary | ICD-10-CM

## 2021-02-24 DIAGNOSIS — Z7189 Other specified counseling: Secondary | ICD-10-CM

## 2021-02-24 DIAGNOSIS — Z5112 Encounter for antineoplastic immunotherapy: Secondary | ICD-10-CM | POA: Diagnosis not present

## 2021-02-24 DIAGNOSIS — R0989 Other specified symptoms and signs involving the circulatory and respiratory systems: Secondary | ICD-10-CM | POA: Diagnosis not present

## 2021-02-24 DIAGNOSIS — E119 Type 2 diabetes mellitus without complications: Secondary | ICD-10-CM | POA: Diagnosis not present

## 2021-02-24 DIAGNOSIS — R002 Palpitations: Secondary | ICD-10-CM | POA: Diagnosis not present

## 2021-02-24 DIAGNOSIS — E785 Hyperlipidemia, unspecified: Secondary | ICD-10-CM | POA: Diagnosis not present

## 2021-02-24 DIAGNOSIS — M170 Bilateral primary osteoarthritis of knee: Secondary | ICD-10-CM | POA: Diagnosis not present

## 2021-02-24 LAB — CMP (CANCER CENTER ONLY)
ALT: 10 U/L (ref 0–44)
AST: 24 U/L (ref 15–41)
Albumin: 4 g/dL (ref 3.5–5.0)
Alkaline Phosphatase: 89 U/L (ref 38–126)
Anion gap: 9 (ref 5–15)
BUN: 23 mg/dL (ref 8–23)
CO2: 27 mmol/L (ref 22–32)
Calcium: 9.4 mg/dL (ref 8.9–10.3)
Chloride: 105 mmol/L (ref 98–111)
Creatinine: 1.16 mg/dL (ref 0.61–1.24)
GFR, Estimated: >60 mL/min (ref >60)
Glucose, Bld: 156 mg/dL — ABNORMAL HIGH (ref 70–99)
Potassium: 4 mmol/L (ref 3.5–5.1)
Sodium: 141 mmol/L (ref 135–145)
Total Bilirubin: 0.6 mg/dL (ref 0.3–1.2)
Total Protein: 7 g/dL (ref 6.5–8.1)

## 2021-02-24 LAB — CBC WITH DIFFERENTIAL (CANCER CENTER ONLY)
Abs Immature Granulocytes: 0.74 10*3/uL — ABNORMAL HIGH (ref 0.00–0.07)
Basophils Absolute: 0.1 10*3/uL (ref 0.0–0.1)
Basophils Relative: 0 %
Eosinophils Absolute: 0.5 10*3/uL (ref 0.0–0.5)
Eosinophils Relative: 0 %
HCT: 39.5 % (ref 39.0–52.0)
Hemoglobin: 12.5 g/dL — ABNORMAL LOW (ref 13.0–17.0)
Immature Granulocytes: 0 %
Lymphocytes Relative: 91 %
Lymphs Abs: 149 10*3/uL — ABNORMAL HIGH (ref 0.7–4.0)
MCH: 31.3 pg (ref 26.0–34.0)
MCHC: 31.6 g/dL (ref 30.0–36.0)
MCV: 98.8 fL (ref 80.0–100.0)
Monocytes Absolute: 10.7 10*3/uL — ABNORMAL HIGH (ref 0.1–1.0)
Monocytes Relative: 6 %
Neutro Abs: 4.7 10*3/uL (ref 1.7–7.7)
Neutrophils Relative %: 3 %
Platelet Count: 120 10*3/uL — ABNORMAL LOW (ref 150–400)
RBC: 4 MIL/uL — ABNORMAL LOW (ref 4.22–5.81)
RDW: 14.6 % (ref 11.5–15.5)
WBC Count: 165.8 10*3/uL (ref 4.0–10.5)
nRBC: 0 % (ref 0.0–0.2)

## 2021-02-24 LAB — SAVE SMEAR(SSMR), FOR PROVIDER SLIDE REVIEW

## 2021-02-24 LAB — HEPATITIS B SURFACE ANTIGEN: Hepatitis B Surface Ag: NONREACTIVE

## 2021-02-24 LAB — HEPATITIS B CORE ANTIBODY, TOTAL: Hep B Core Total Ab: NONREACTIVE

## 2021-02-24 LAB — LACTATE DEHYDROGENASE: LDH: 202 U/L — ABNORMAL HIGH (ref 98–192)

## 2021-02-24 LAB — HEPATITIS C ANTIBODY: HCV Ab: NONREACTIVE

## 2021-02-24 LAB — HEPATITIS B SURFACE ANTIBODY,QUALITATIVE: Hep B S Ab: NONREACTIVE

## 2021-02-24 MED ORDER — DIPHENHYDRAMINE HCL 25 MG PO CAPS
ORAL_CAPSULE | ORAL | Status: AC
  Filled 2021-02-24: qty 2

## 2021-02-24 MED ORDER — SODIUM CHLORIDE 0.9 % IV SOLN
375.0000 mg/m2 | Freq: Once | INTRAVENOUS | Status: AC
  Administered 2021-02-24: 700 mg via INTRAVENOUS
  Filled 2021-02-24: qty 20

## 2021-02-24 MED ORDER — ALLOPURINOL 300 MG PO TABS: 300.0000 mg | ORAL_TABLET | Freq: Every day | ORAL | 1 refills | Status: DC

## 2021-02-24 MED ORDER — ACETAMINOPHEN 325 MG PO TABS
ORAL_TABLET | ORAL | Status: AC
  Filled 2021-02-24: qty 2

## 2021-02-24 MED ORDER — DIPHENHYDRAMINE HCL 25 MG PO CAPS
50.0000 mg | ORAL_CAPSULE | Freq: Once | ORAL | Status: AC
  Administered 2021-02-24: 50 mg via ORAL

## 2021-02-24 MED ORDER — SODIUM CHLORIDE 0.9 % IV SOLN
Freq: Once | INTRAVENOUS | Status: AC
  Filled 2021-02-24: qty 250

## 2021-02-24 MED ORDER — ACETAMINOPHEN 325 MG PO TABS
650.0000 mg | ORAL_TABLET | Freq: Once | ORAL | Status: AC
Start: 2021-02-24 — End: 2021-02-24
  Administered 2021-02-24: 650 mg via ORAL

## 2021-02-24 NOTE — Progress Notes (Signed)
Met with patient at registration to introduce myself as Financial Resource Specialist and to offer available resources.  Discussed one-time $1000 [employer] grant and qualifications to assist with personal expenses while going through treatment.  Gave him my card if interested in applying and for any additional financial questions or concerns.  

## 2021-02-24 NOTE — Patient Instructions (Signed)
Rituximab; Hyaluronidase injection What is this medicine? RITUXIMAB; HYALURONIDASE (ri TUX i mab / hye al ur ON i dase) is used to treat non-Hodgkin lymphoma and chronic lymphocytic leukemia. Rituximab is a monoclonal antibody. Hyaluronidase is used to improve the effects of rituximab. This medicine may be used for other purposes; ask your health care provider or pharmacist if you have questions. COMMON BRAND NAME(S): Rituxan Hycela What should I tell my health care provider before I take this medicine? They need to know if you have any of these conditions:  heart disease  infection (especially a virus infection such as hepatitis B, chickenpox, cold sores, or herpes)  immune system problems  irregular heartbeat  kidney disease  lung or breathing disease, like asthma  recently received or scheduled to receive a vaccine  an unusual or allergic reaction to rituximab, rituximab/hyaluronidase, mouse proteins, other medicines, foods, dyes, or preservatives  pregnant or trying to get pregnant  breast-feeding How should I use this medicine? This medicine is for injection under the skin. It is given by a health care professional in a hospital or clinic setting. A special MedGuide will be given to you before each treatment. Be sure to read this information carefully each time. Talk to your pediatrician regarding the use of this medicine in children. Special care may be needed. Overdosage: If you think you have taken too much of this medicine contact a poison control center or emergency room at once. NOTE: This medicine is only for you. Do not share this medicine with others. What if I miss a dose? It is important not to miss your dose. Call your doctor or health care professional if you are unable to keep an appointment. What may interact with this medicine? This medicine may interact with the following medications:  cisplatin  live virus vaccines This list may not describe all  possible interactions. Give your health care provider a list of all the medicines, herbs, non-prescription drugs, or dietary supplements you use. Also tell them if you smoke, drink alcohol, or use illegal drugs. Some items may interact with your medicine. What should I watch for while using this medicine? Your condition will be monitored carefully while you are receiving this medicine. You may need blood work done while you are taking this medicine. This medicine can cause serious allergic reactions. To reduce your risk you may need to take medicine before treatment with this medicine. Take your medicine as directed. In some patients, this medicine may cause a serious brain infection that may cause death. If you have any problems seeing, thinking, speaking, walking, or standing, tell your doctor right away. If you cannot reach your doctor, urgently seek other source of medical care. Call your doctor or health care professional for advice if you get a fever, chills or sore throat, or other symptoms of a cold or flu. Do not treat yourself. This drug decreases your body's ability to fight infections. Try to avoid being around people who are sick. Do not become pregnant while taking this medicine or for 12 months after stopping it. Women should inform their doctor if they wish to become pregnant or think they might be pregnant. There is a potential for serious side effects to an unborn child. Talk to your health care professional or pharmacist for more information. Do not breast-feed an infant while taking this medicine or for at least 6 months after stopping it. What side effects may I notice from receiving this medicine? Side effects that you should report   to your doctor or health care professional as soon as possible:  allergic reactions like skin rash, itching or hives; swelling of the face, lips, or tongue  breathing problems  chest pain  changes in vision  diarrhea  dizziness  headache with  fever, neck stiffness, sensitivity to light, nausea, or confusion  fast, irregular heartbeat  loss of memory  low blood counts - this medicine may decrease the number of white blood cells, red blood cells and platelets. You may be at increased risk for infections and bleeding.  mouth sores  problems with balance, talking, or walking  redness, blistering, peeling or loosening of the skin, including inside the mouth  signs of infection - fever or chills, cough, sore throat, pain or difficulty passing urine  signs and symptoms of kidney injury like trouble passing urine or change in the amount of urine  signs and symptoms of liver injury like dark yellow or brown urine; general ill feeling or flu-like symptoms; light-colored stools; loss of appetite; nausea; right upper belly pain; unusually weak or tired; yellowing of the eyes or skin  stomach pain  swelling of the ankles, feet, hands  unusual bleeding or bruising  vomiting Side effects that usually do not require medical attention (report these to your doctor or health care professional if they continue or are bothersome):  headache  joint pain  muscle cramps or muscle pain  nausea  pain, redness, or irritation at site where injected  tiredness This list may not describe all possible side effects. Call your doctor for medical advice about side effects. You may report side effects to FDA at 1-800-FDA-1088. Where should I keep my medicine? This drug is given in a hospital or clinic and will not be stored at home. NOTE: This sheet is a summary. It may not cover all possible information. If you have questions about this medicine, talk to your doctor, pharmacist, or health care provider.  2021 [employer]vier/Gold Standard (2017-11-26 13:12:25)  

## 2021-02-25 ENCOUNTER — Telehealth: Payer: Self-pay | Admitting: *Deleted

## 2021-02-25 LAB — KAPPA/LAMBDA LIGHT CHAINS
Kappa free light chain: 44.9 mg/L — ABNORMAL HIGH (ref 3.3–19.4)
Kappa, lambda light chain ratio: 3.01 — ABNORMAL HIGH (ref 0.26–1.65)
Lambda free light chains: 14.9 mg/L (ref 5.7–26.3)

## 2021-02-25 LAB — SURGICAL PATHOLOGY

## 2021-02-25 NOTE — Telephone Encounter (Signed)
RN received message from after-hours nurse line. Pt call them at 6:35pm. Stated he "left the cancer center after treatment and became very dizzy, heart was pounding and had chills."  Was encouraged by after-hours staff to go to the ED. He did not want to go to the ED, said he might go to UC.   Called patient. He states he is feeling better today. No chills. Is requesting a prescription for "inner ear". States he discussed dizziness briefly with Dr Lorenso Courier yesterday. Has taken Antivet in the past.

## 2021-02-27 ENCOUNTER — Other Ambulatory Visit: Payer: Self-pay | Admitting: *Deleted

## 2021-02-27 ENCOUNTER — Encounter: Payer: Medicare HMO | Admitting: Medical

## 2021-02-27 ENCOUNTER — Ambulatory Visit (HOSPITAL_COMMUNITY)
Admission: RE | Admit: 2021-02-27 | Discharge: 2021-02-27 | Disposition: A | Discharge status: Home / Self Care | Payer: Medicare HMO | Source: Ambulatory Visit | Attending: Hematology and Oncology | Admitting: Hematology and Oncology

## 2021-02-27 ENCOUNTER — Other Ambulatory Visit: Payer: Self-pay

## 2021-02-27 ENCOUNTER — Telehealth: Payer: Self-pay | Admitting: *Deleted

## 2021-02-27 DIAGNOSIS — M7989 Other specified soft tissue disorders: Secondary | ICD-10-CM | POA: Diagnosis not present

## 2021-02-27 DIAGNOSIS — M79662 Pain in left lower leg: Secondary | ICD-10-CM | POA: Diagnosis not present

## 2021-02-27 LAB — MULTIPLE MYELOMA PANEL, SERUM
Albumin SerPl Elph-Mcnc: 3.8 g/dL (ref 2.9–4.4)
Albumin/Glob SerPl: 1.4 (ref 0.7–1.7)
Alpha 1: 0.3 g/dL (ref 0.0–0.4)
Alpha2 Glob SerPl Elph-Mcnc: 0.8 g/dL (ref 0.4–1.0)
B-Globulin SerPl Elph-Mcnc: 1.4 g/dL — ABNORMAL HIGH (ref 0.7–1.3)
Gamma Glob SerPl Elph-Mcnc: 0.5 g/dL (ref 0.4–1.8)
Globulin, Total: 2.8 g/dL (ref 2.2–3.9)
IgA: 64 mg/dL (ref 61–437)
IgG (Immunoglobin G), Serum: 1832 mg/dL — ABNORMAL HIGH (ref 603–1613)
IgM (Immunoglobulin M), Srm: 29 mg/dL (ref 15–143)
Total Protein ELP: 6.6 g/dL (ref 6.0–8.5)

## 2021-02-27 NOTE — Telephone Encounter (Signed)
Received call from patient.  He states he received Rituxan on 02/24/21. And started Allopurinol.    Today he states he is experiencing tingling in his left hand finger tips and c/o calf tenderness in his left leg. He also states he thinks his left leg has some swelling.  Denies any previous h/o DVT Advised that I would let Dr. Lorenso Courier know and call him back with any recommendations.

## 2021-02-27 NOTE — Telephone Encounter (Signed)
TCT patient. Advised that Dr. Lorenso Courier would like to have him seen in our symptom management clinic today after getting a lower extremity doppler study to r/o DVT. Advised that his doppler study is @ 3:30 pm and then to come over to the cancer center after that is done to see Sandi Mealy, PA in the symptom management center.  Mr. Barg voiced understanding.  He is leaving now for [facility]

## 2021-02-27 NOTE — Progress Notes (Signed)
Left lower extremity venous duplex has been completed. Preliminary results can be found in CV Proc through chart review.  Results were given to Sandi Mealy PA.  02/27/21 3:31 PM Carlos Levering RVT

## 2021-02-27 NOTE — Progress Notes (Signed)
Mr. [facility] was seen as he was referred leaving the cancer center after he had received his first treatment of Rituxan.  He became dizzy and had to sit down on a bench in front of the cancer center.  He did not fall.  He did not injure himself.  His vital signs showed a blood pressure of 119/71 with a pulse of 96 with oxygen saturation of 79%.  His hands were extremely cold.  A warm pack was placed on his hands with his pulse remaining stable at around 96 and his oxygen saturation increasing to 96%.  He was helped to his car and was released home.  He and his wife were told to call 911 or present to the emergency room should he have additional episodes of dizziness or falls.  He was told to push fluids.  They were told to call our office as needed.  They expressed understanding and agreement with this plan.  Sandi Mealy, MHS, PA-C Physician Assistant

## 2021-03-02 ENCOUNTER — Encounter: Payer: Self-pay | Admitting: Hematology and Oncology

## 2021-03-03 ENCOUNTER — Inpatient Hospital Stay: Payer: Medicare HMO

## 2021-03-03 ENCOUNTER — Other Ambulatory Visit: Payer: Self-pay

## 2021-03-03 ENCOUNTER — Inpatient Hospital Stay: Payer: Medicare HMO | Attending: Hematology and Oncology

## 2021-03-03 ENCOUNTER — Other Ambulatory Visit: Payer: Self-pay | Admitting: Hematology and Oncology

## 2021-03-03 VITALS — BP 131/64 | HR 82 | Temp 99.3°F | Resp 18 | Wt 147.2 lb

## 2021-03-03 DIAGNOSIS — M170 Bilateral primary osteoarthritis of knee: Secondary | ICD-10-CM | POA: Diagnosis not present

## 2021-03-03 DIAGNOSIS — D72820 Lymphocytosis (symptomatic): Secondary | ICD-10-CM

## 2021-03-03 DIAGNOSIS — Z5112 Encounter for antineoplastic immunotherapy: Secondary | ICD-10-CM | POA: Diagnosis not present

## 2021-03-03 DIAGNOSIS — C8307 Small cell B-cell lymphoma, spleen: Secondary | ICD-10-CM | POA: Diagnosis present

## 2021-03-03 DIAGNOSIS — R002 Palpitations: Secondary | ICD-10-CM | POA: Diagnosis not present

## 2021-03-03 DIAGNOSIS — R0989 Other specified symptoms and signs involving the circulatory and respiratory systems: Secondary | ICD-10-CM | POA: Diagnosis not present

## 2021-03-03 DIAGNOSIS — E785 Hyperlipidemia, unspecified: Secondary | ICD-10-CM | POA: Diagnosis not present

## 2021-03-03 DIAGNOSIS — E119 Type 2 diabetes mellitus without complications: Secondary | ICD-10-CM | POA: Insufficient documentation

## 2021-03-03 LAB — CBC WITH DIFFERENTIAL (CANCER CENTER ONLY)
Abs Immature Granulocytes: 0.57 10*3/uL — ABNORMAL HIGH (ref 0.00–0.07)
Basophils Absolute: 0.1 10*3/uL (ref 0.0–0.1)
Basophils Relative: 0 %
Eosinophils Absolute: 0.6 10*3/uL — ABNORMAL HIGH (ref 0.0–0.5)
Eosinophils Relative: 1 %
HCT: 42.4 % (ref 39.0–52.0)
Hemoglobin: 13.7 g/dL (ref 13.0–17.0)
Immature Granulocytes: 0 %
Lymphocytes Relative: 91 %
Lymphs Abs: 119.3 10*3/uL — ABNORMAL HIGH (ref 0.7–4.0)
MCH: 31 pg (ref 26.0–34.0)
MCHC: 32.3 g/dL (ref 30.0–36.0)
MCV: 95.9 fL (ref 80.0–100.0)
Monocytes Absolute: 5.3 10*3/uL — ABNORMAL HIGH (ref 0.1–1.0)
Monocytes Relative: 4 %
Neutro Abs: 4.6 10*3/uL (ref 1.7–7.7)
Neutrophils Relative %: 4 %
Platelet Count: 119 10*3/uL — ABNORMAL LOW (ref 150–400)
RBC: 4.42 MIL/uL (ref 4.22–5.81)
RDW: 14.4 % (ref 11.5–15.5)
WBC Count: 130.4 10*3/uL (ref 4.0–10.5)
nRBC: 0 % (ref 0.0–0.2)

## 2021-03-03 LAB — CMP (CANCER CENTER ONLY)
ALT: 16 U/L (ref 0–44)
AST: 26 U/L (ref 15–41)
Albumin: 4 g/dL (ref 3.5–5.0)
Alkaline Phosphatase: 80 U/L (ref 38–126)
Anion gap: 10 (ref 5–15)
BUN: 20 mg/dL (ref 8–23)
CO2: 26 mmol/L (ref 22–32)
Calcium: 9.2 mg/dL (ref 8.9–10.3)
Chloride: 103 mmol/L (ref 98–111)
Creatinine: 1.16 mg/dL (ref 0.61–1.24)
GFR, Estimated: >60 mL/min
Glucose, Bld: 242 mg/dL — ABNORMAL HIGH (ref 70–99)
Potassium: 4 mmol/L (ref 3.5–5.1)
Sodium: 139 mmol/L (ref 135–145)
Total Bilirubin: 0.8 mg/dL (ref 0.3–1.2)
Total Protein: 6.9 g/dL (ref 6.5–8.1)

## 2021-03-03 LAB — LACTATE DEHYDROGENASE: LDH: 178 U/L (ref 98–192)

## 2021-03-03 MED ORDER — SODIUM CHLORIDE 0.9 % IV SOLN
375.0000 mg/m2 | Freq: Once | INTRAVENOUS | Status: AC
  Administered 2021-03-03: 700 mg via INTRAVENOUS
  Filled 2021-03-03: qty 50

## 2021-03-03 MED ORDER — DIPHENHYDRAMINE HCL 25 MG PO CAPS
ORAL_CAPSULE | ORAL | Status: AC
  Filled 2021-03-03: qty 2

## 2021-03-03 MED ORDER — ACETAMINOPHEN 325 MG PO TABS
650.0000 mg | ORAL_TABLET | Freq: Once | ORAL | Status: AC
Start: 2021-03-03 — End: 2021-03-03
  Administered 2021-03-03: 650 mg via ORAL

## 2021-03-03 MED ORDER — SODIUM CHLORIDE 0.9 % IV SOLN
Freq: Once | INTRAVENOUS | Status: AC
Start: 2021-03-03 — End: 2021-03-03
  Filled 2021-03-03: qty 250

## 2021-03-03 MED ORDER — DIPHENHYDRAMINE HCL 25 MG PO CAPS
50.0000 mg | ORAL_CAPSULE | Freq: Once | ORAL | Status: AC
  Administered 2021-03-03: 50 mg via ORAL

## 2021-03-03 MED ORDER — ACETAMINOPHEN 325 MG PO TABS
ORAL_TABLET | ORAL | Status: AC
  Filled 2021-03-03: qty 2

## 2021-03-03 NOTE — Patient Instructions (Signed)
[facility] Discharge Instructions for Patients Receiving Chemotherapy  Today you received the following chemotherapy agents: Rituximab  To help prevent nausea and vomiting after your treatment, we encourage you to take your nausea medication as needed   If you develop nausea and vomiting that is not controlled by your nausea medication, call the clinic.   BELOW ARE SYMPTOMS THAT SHOULD BE REPORTED IMMEDIATELY:  *FEVER GREATER THAN 100.5 F  *CHILLS WITH OR WITHOUT FEVER  NAUSEA AND VOMITING THAT IS NOT CONTROLLED WITH YOUR NAUSEA MEDICATION  *UNUSUAL SHORTNESS OF BREATH  *UNUSUAL BRUISING OR BLEEDING  TENDERNESS IN MOUTH AND THROAT WITH OR WITHOUT PRESENCE OF ULCERS  *URINARY PROBLEMS  *BOWEL PROBLEMS  UNUSUAL RASH Items with * indicate a potential emergency and should be followed up as soon as possible.  Feel free to call the clinic should you have any questions or concerns. The clinic phone number is [phone].  Please show the [facility] at check-in to the Emergency Department and triage nurse.

## 2021-03-06 ENCOUNTER — Encounter (HOSPITAL_COMMUNITY): Payer: Self-pay | Admitting: Hematology and Oncology

## 2021-03-06 LAB — SURGICAL PATHOLOGY

## 2021-03-10 ENCOUNTER — Encounter: Payer: Self-pay | Admitting: Hematology and Oncology

## 2021-03-10 ENCOUNTER — Inpatient Hospital Stay: Payer: Medicare HMO

## 2021-03-10 ENCOUNTER — Other Ambulatory Visit: Payer: Self-pay

## 2021-03-10 ENCOUNTER — Inpatient Hospital Stay: Payer: Medicare HMO | Admitting: Hematology and Oncology

## 2021-03-10 VITALS — BP 107/54 | HR 90 | Temp 98.8°F | Resp 18

## 2021-03-10 VITALS — BP 157/67 | HR 77 | Temp 97.4°F | Resp 17 | Ht 68.0 in | Wt 152.6 lb

## 2021-03-10 DIAGNOSIS — Z7189 Other specified counseling: Secondary | ICD-10-CM

## 2021-03-10 DIAGNOSIS — M170 Bilateral primary osteoarthritis of knee: Secondary | ICD-10-CM | POA: Diagnosis not present

## 2021-03-10 DIAGNOSIS — E785 Hyperlipidemia, unspecified: Secondary | ICD-10-CM | POA: Diagnosis not present

## 2021-03-10 DIAGNOSIS — Z5112 Encounter for antineoplastic immunotherapy: Secondary | ICD-10-CM | POA: Diagnosis not present

## 2021-03-10 DIAGNOSIS — C8307 Small cell B-cell lymphoma, spleen: Secondary | ICD-10-CM

## 2021-03-10 DIAGNOSIS — D72820 Lymphocytosis (symptomatic): Secondary | ICD-10-CM

## 2021-03-10 DIAGNOSIS — R0989 Other specified symptoms and signs involving the circulatory and respiratory systems: Secondary | ICD-10-CM | POA: Diagnosis not present

## 2021-03-10 DIAGNOSIS — E119 Type 2 diabetes mellitus without complications: Secondary | ICD-10-CM | POA: Diagnosis not present

## 2021-03-10 DIAGNOSIS — R002 Palpitations: Secondary | ICD-10-CM | POA: Diagnosis not present

## 2021-03-10 LAB — CBC WITH DIFFERENTIAL (CANCER CENTER ONLY)
Abs Immature Granulocytes: 0.24 10*3/uL — ABNORMAL HIGH (ref 0.00–0.07)
Basophils Absolute: 0.1 10*3/uL (ref 0.0–0.1)
Basophils Relative: 0 %
Eosinophils Absolute: 0.8 10*3/uL — ABNORMAL HIGH (ref 0.0–0.5)
Eosinophils Relative: 1 %
HCT: 41.1 % (ref 39.0–52.0)
Hemoglobin: 13.4 g/dL (ref 13.0–17.0)
Immature Granulocytes: 0 %
Lymphocytes Relative: 90 %
Lymphs Abs: 99.6 10*3/uL — ABNORMAL HIGH (ref 0.7–4.0)
MCH: 30.9 pg (ref 26.0–34.0)
MCHC: 32.6 g/dL (ref 30.0–36.0)
MCV: 94.7 fL (ref 80.0–100.0)
Monocytes Absolute: 5.2 10*3/uL — ABNORMAL HIGH (ref 0.1–1.0)
Monocytes Relative: 5 %
Neutro Abs: 3.9 10*3/uL (ref 1.7–7.7)
Neutrophils Relative %: 4 %
Platelet Count: 142 10*3/uL — ABNORMAL LOW (ref 150–400)
RBC: 4.34 MIL/uL (ref 4.22–5.81)
RDW: 14.3 % (ref 11.5–15.5)
WBC Count: 109.8 10*3/uL (ref 4.0–10.5)
nRBC: 0 % (ref 0.0–0.2)

## 2021-03-10 LAB — CMP (CANCER CENTER ONLY)
ALT: 18 U/L (ref 0–44)
AST: 26 U/L (ref 15–41)
Albumin: 3.8 g/dL (ref 3.5–5.0)
Alkaline Phosphatase: 91 U/L (ref 38–126)
Anion gap: 7 (ref 5–15)
BUN: 22 mg/dL (ref 8–23)
CO2: 27 mmol/L (ref 22–32)
Calcium: 8.8 mg/dL — ABNORMAL LOW (ref 8.9–10.3)
Chloride: 103 mmol/L (ref 98–111)
Creatinine: 1.2 mg/dL (ref 0.61–1.24)
GFR, Estimated: >60 mL/min (ref >60)
Glucose, Bld: 246 mg/dL — ABNORMAL HIGH (ref 70–99)
Potassium: 3.8 mmol/L (ref 3.5–5.1)
Sodium: 137 mmol/L (ref 135–145)
Total Bilirubin: 0.5 mg/dL (ref 0.3–1.2)
Total Protein: 6.6 g/dL (ref 6.5–8.1)

## 2021-03-10 LAB — LACTATE DEHYDROGENASE: LDH: 162 U/L (ref 98–192)

## 2021-03-10 MED ORDER — DIPHENHYDRAMINE HCL 25 MG PO CAPS
50.0000 mg | ORAL_CAPSULE | Freq: Once | ORAL | Status: AC
  Administered 2021-03-10: 50 mg via ORAL

## 2021-03-10 MED ORDER — DIPHENHYDRAMINE HCL 25 MG PO CAPS
ORAL_CAPSULE | ORAL | Status: AC
  Filled 2021-03-10: qty 2

## 2021-03-10 MED ORDER — SODIUM CHLORIDE 0.9 % IV SOLN: 375.0000 mg/m2 | Freq: Once | INTRAVENOUS | Status: DC

## 2021-03-10 MED ORDER — SODIUM CHLORIDE 0.9 % IV SOLN
Freq: Once | INTRAVENOUS | Status: AC
  Filled 2021-03-10: qty 250

## 2021-03-10 MED ORDER — ACETAMINOPHEN 325 MG PO TABS
650.0000 mg | ORAL_TABLET | Freq: Once | ORAL | Status: AC
  Administered 2021-03-10: 650 mg via ORAL

## 2021-03-10 MED ORDER — ACETAMINOPHEN 325 MG PO TABS
ORAL_TABLET | ORAL | Status: AC
  Filled 2021-03-10: qty 2

## 2021-03-10 MED ORDER — SODIUM CHLORIDE 0.9 % IV SOLN
375.0000 mg/m2 | Freq: Once | INTRAVENOUS | Status: AC
  Administered 2021-03-10: 700 mg via INTRAVENOUS
  Filled 2021-03-10: qty 20

## 2021-03-10 NOTE — Patient Instructions (Signed)
[facility] Discharge Instructions for Patients Receiving Chemotherapy  Today you received the following chemotherapy agents: Rituximab  To help prevent nausea and vomiting after your treatment, we encourage you to take your nausea medication as needed   If you develop nausea and vomiting that is not controlled by your nausea medication, call the clinic.   BELOW ARE SYMPTOMS THAT SHOULD BE REPORTED IMMEDIATELY:  *FEVER GREATER THAN 100.5 F  *CHILLS WITH OR WITHOUT FEVER  NAUSEA AND VOMITING THAT IS NOT CONTROLLED WITH YOUR NAUSEA MEDICATION  *UNUSUAL SHORTNESS OF BREATH  *UNUSUAL BRUISING OR BLEEDING  TENDERNESS IN MOUTH AND THROAT WITH OR WITHOUT PRESENCE OF ULCERS  *URINARY PROBLEMS  *BOWEL PROBLEMS  UNUSUAL RASH Items with * indicate a potential emergency and should be followed up as soon as possible.  Feel free to call the clinic should you have any questions or concerns. The clinic phone number is [phone].  Please show the [facility] at check-in to the Emergency Department and triage nurse.

## 2021-03-10 NOTE — Progress Notes (Signed)
[place] [phone]   [phone]  PROGRESS NOTE  Patient Care Team: [name], MD as PCP - General (Family Medicine) Specialists, Raliegh I[facility] as Consulting Physician (Orthopedic Surgery)  Hematological/Oncological History # Splenic Marginal Zone Lymphoma 01/30/2021: WBC 186.3, Hgb 13.5, MCV 96.3, Plt 132. Referred to Emergency Department. 02/05/2021: establish care with Dr. Lorenso Courier  02/24/2021: Week 1 of Monotherapy Rituximab 03/03/2021:  Week 2 of Monotherapy Rituximab 03/10/2021:  Week 3 of Monotherapy Rituximab  Interval History:  Keith Jordan 80 y.o. male with medical history significant for splenic marginal zone lymphoma who presents for a follow up visit. The patient's last visit was on 02/24/2021. In the interim since the last visit he has completed 2 weeks of monotherapy rituximab.   On exam today Keith Jordan he had a rough time with week one of his rituximab therapy.  He reports that after the treatment he was leaving the facility and he got dizzy and had a drop in his oxygen levels and had chills.  Reports that he did have some aches and pains in his muscles after the treatment, but overall tolerated it well without any nausea, vomiting, or diarrhea.  His second treatment went well without any complication.  Reports that his appetite is good.  He does report that he is having dry mouth and some occasional itching of his eyes.  A full 10 point ROS is listed below.  MEDICAL HISTORY:  Past Medical History:  Diagnosis Date  . Hyperlipidemia   . Ischemic optic neuropathy 2006   [place] at [facility]  . Personal history of colonic adenoma 07/30/2008  . Prediabetes 07/05/2016   A1c 6.5% 03/2014   . Primary osteoarthritis of knee    bilateral s/p L knee replacement, receives R knee injections [name], Noemi Chapel)    SURGICAL HISTORY: Past Surgical History:  Procedure Laterality Date  . COLONOSCOPY  12/2013   no polyps, no  rpt due [name])  . CYST EXCISION  01/2020  . TOTAL KNEE ARTHROPLASTY Left 2005   [name]    SOCIAL HISTORY: Social History   Socioeconomic History  . Marital status: Married    Spouse name: Not on file  . Number of children: Not on file  . Years of education: Not on file  . Highest education level: Not on file  Occupational History  . Not on file  Tobacco Use  . Smoking status: Never Smoker  . Smokeless tobacco: Never Used  Vaping Use  . Vaping Use: Never used  Substance and Sexual Activity  . Alcohol use: Yes    Comment: occasional  . Drug use: No  . Sexual activity: Not Currently  Other Topics Concern  . Not on file  Social History Narrative   Lives with wife, 2 cats   Occ: retired Personal assistant   Activity: works in yard   Diet: some water, fruits/vegetables daily   Social Determinants of Radio broadcast assistant Strain: [place]   . Difficulty of Paying Living Expenses: Not hard at all  Food Insecurity: No Food Insecurity  . Worried About Charity fundraiser in the Last Year: Never true  . Ran Out of Food in the Last Year: Never true  Transportation Needs: No Transportation Needs  . Lack of Transportation (Medical): No  . Lack of Transportation (Non-Medical): No  Physical Activity: Inactive  . Days of Exercise per Week: 0 days  . Minutes of Exercise per Session: 0 min  Stress: No Stress  Concern Present  . Feeling of Stress : Not at all  Social Connections: Not on file  Intimate Partner Violence: Not At Risk  . Fear of Current or Ex-Partner: No  . Emotionally Abused: No  . Physically Abused: No  . Sexually Abused: No    FAMILY HISTORY: Family History  Problem Relation Age of Onset  . Blindness Mother 52       ?stroke in eyes  . Lung disease Father        Ecologist  . Arthritis Father   . Colon cancer Neg Hx   . Pancreatic cancer Neg Hx   . Stomach cancer Neg Hx   . Esophageal cancer Neg Hx   . Rectal cancer Neg Hx   . Cancer Neg Hx   . Diabetes  Neg Hx   . CAD Neg Hx   . Stroke Neg Hx     ALLERGIES:  has No Known Allergies.  MEDICATIONS:  Current Outpatient Medications  Medication Sig Dispense Refill  . acetaminophen (TYLENOL) 500 MG tablet Take 500 mg by mouth every 6 (six) hours as needed for mild pain.    Marland Kitchen allopurinol (ZYLOPRIM) 300 MG tablet Take 1 tablet (300 mg total) by mouth daily. 30 tablet 1  . Ascorbic Acid (VITAMIN C) 1000 MG tablet Take 500 mg by mouth 3 (three) times a week.    Marland Kitchen aspirin EC 81 MG tablet Take 1 tablet (81 mg total) by mouth 2 (two) times a week. (Patient taking differently: Take 81 mg by mouth 3 (three) times a week.)    . atorvastatin (LIPITOR) 10 MG tablet Take 1 tablet (10 mg total) by mouth daily. 90 tablet 3  . carboxymethylcellulose (REFRESH PLUS) 0.5 % SOLN Place 1 drop into both eyes 2 (two) times daily as needed (dry eyes).    . Multiple Vitamins-Minerals (PRESERVISION AREDS) CAPS Take 1 capsule by mouth in the morning and at bedtime. 180 capsule 3  . sodium chloride (OCEAN) 0.65 % SOLN nasal spray Place 1 spray into both nostrils as needed for congestion.     No current facility-administered medications for this visit.    REVIEW OF SYSTEMS:   Constitutional: ( - ) fevers, ( - )  chills , ( - ) night sweats Eyes: ( - ) blurriness of vision, ( - ) double vision, ( - ) watery eyes Ears, nose, mouth, throat, and face: ( - ) mucositis, ( - ) sore throat Respiratory: ( - ) cough, ( - ) dyspnea, ( - ) wheezes Cardiovascular: ( - ) palpitation, ( - ) chest discomfort, ( - ) lower extremity swelling Gastrointestinal:  ( - ) nausea, ( - ) heartburn, ( - ) change in bowel habits Skin: ( - ) abnormal skin rashes Lymphatics: ( - ) new lymphadenopathy, ( - ) easy bruising Neurological: ( - ) numbness, ( - ) tingling, ( - ) new weaknesses Behavioral/Psych: ( - ) mood change, ( - ) new changes  All other systems were reviewed with the patient and are negative.  PHYSICAL EXAMINATION: ECOG  PERFORMANCE STATUS: 1 - Symptomatic but completely ambulatory  Vitals:   03/10/21 0950  BP: (!) 157/67  Pulse: 77  Resp: 17  Temp: (!) 97.4 F (36.3 C)  SpO2: 99%   Filed Weights   03/10/21 0950  Weight: 152 lb 9.6 oz (69.2 kg)    GENERAL: well appearing elderly Caucasian male alert, no distress and comfortable SKIN: skin color, texture, turgor are normal, no  rashes or significant lesions EYES: conjunctiva are pink and non-injected, sclera clear LUNGS: clear to auscultation and percussion with normal breathing effort HEART: regular rate & rhythm and no murmurs and no lower extremity edema Musculoskeletal: no cyanosis of digits and no clubbing  PSYCH: alert & oriented x 3, fluent speech NEURO: no focal motor/sensory deficits  LABORATORY DATA:  I have reviewed the data as listed CBC Latest Ref Rng & Units 03/10/2021 03/03/2021 02/24/2021  WBC 4.0 - 10.5 K/uL 109.8(HH) 130.4(HH) 165.8(HH)  Hemoglobin 13.0 - 17.0 g/dL 13.4 13.7 12.5(L)  Hematocrit 39.0 - 52.0 % 41.1 42.4 39.5  Platelets 150 - 400 K/uL 142(L) 119(L) 120(L)    CMP Latest Ref Rng & Units 03/03/2021 02/24/2021 02/05/2021  Glucose 70 - 99 mg/dL 242(H) 156(H) 145(H)  BUN 8 - 23 mg/dL 20 23 25(H)  Creatinine 0.61 - 1.24 mg/dL 1.16 1.16 1.18  Sodium 135 - 145 mmol/L 139 141 140  Potassium 3.5 - 5.1 mmol/L 4.0 4.0 4.4  Chloride 98 - 111 mmol/L 103 105 104  CO2 22 - 32 mmol/L _0 Calcium 8.9 - 10.3 mg/dL 9.2 9.4 9.5  Total Protein 6.5 - 8.1 g/dL 6.9 7.0 7.2  Total Bilirubin 0.3 - 1.2 mg/dL 0.8 0.6 0.6  Alkaline Phos 38 - 126 U/L 80 89 96  AST 15 - 41 U/L _1 ALT 0 - 44 U/L _2 RADIOGRAPHIC STUDIES: CT CHEST ABDOMEN PELVIS W CONTRAST  Result Date: 02/13/2021 CLINICAL DATA:  Splenic marginal zone lymphoma. EXAM: CT CHEST, ABDOMEN, AND PELVIS WITH CONTRAST TECHNIQUE: Multidetector CT imaging of the chest, abdomen and pelvis was performed following the standard protocol during bolus administration of  intravenous contrast. CONTRAST:  198m OMNIPAQUE IOHEXOL 300 MG/ML  SOLN COMPARISON:  02/23/2008 FINDINGS: CT CHEST FINDINGS Cardiovascular: The heart is normal in size. No pericardial effusion. The aorta is normal in caliber. Mild tortuosity and scattered calcifications at the aortic arch. The branch vessels are patent. Coronary artery calcifications are noted. Mediastinum/Nodes: No mediastinal or hilar mass or lymphadenopathy. The esophagus is unremarkable. Lungs/Pleura: No acute pulmonary findings. No worrisome pulmonary lesions. 4.5 mm subpleural nodule in the right middle lobe on image 112/4 is stable and considered benign. No pleural effusion or pleural nodules. Musculoskeletal: No chest wall mass, supraclavicular or axillary adenopathy. Small scattered benign-appearing nodes are noted. The bony thorax is intact. No lytic or sclerotic bone lesions are identified. CT ABDOMEN PELVIS FINDINGS Hepatobiliary: No hepatic lesions or intrahepatic biliary dilatation. The gallbladder is unremarkable. No common bile duct dilatation. Pancreas: No mass, inflammation or ductal dilatation. Spleen: Marked splenomegaly. The spleen measures 19 x 15 x 10 cm. Accessory spleens are noted. No splenic lesions or evidence of splenic infarct. Adrenals/Urinary Tract: The adrenal glands are unremarkable. There is a right renal calculus but no obstructing ureteral calculi. No worrisome renal or bladder lesions are identified. Stomach/Bowel: The stomach, duodenum, small bowel and colon are unremarkable. No acute inflammatory changes, mass lesions or obstructive findings. A small hiatal hernia is noted. The terminal ileum and appendix are normal. Advanced sigmoid colon diverticulosis but no findings for acute diverticulitis. Vascular/Lymphatic: Atherosclerotic calcifications involving the aorta and iliac arteries but no aneurysm or dissection. The branch vessels are patent. The major venous structures are patent. No abdominal/pelvic  lymphadenopathy. Small scattered lymph nodes are noted. Reproductive: Enlarged prostate gland with median lobe hypertrophy impressing on the base of the bladder. The seminal vesicles are unremarkable. Other: No pelvic mass or  adenopathy. No free pelvic fluid collections. No inguinal mass or adenopathy. No abdominal wall hernia or subcutaneous lesions. Musculoskeletal: No significant bony findings. IMPRESSION: 1. Marked splenomegaly.  No splenic lesions or splenic infarct. 2. No adenopathy in the chest, abdomen or pelvis. 3. Right renal calculus but no obstructing ureteral calculi or bladder calculi. 4. Enlarged prostate gland with median lobe hypertrophy impressing on the base of the bladder. 5. Advanced sigmoid colon diverticulosis but no findings for acute diverticulitis. 6. Stable 4.5 mm right middle lobe pulmonary nodule, considered benign. 7. Aortic atherosclerosis. Aortic Atherosclerosis (ICD10-I70.0). Electronically Signed   By: Marijo Sanes M.D.   On: 02/13/2021 11:00   CT Biopsy  Result Date: 02/21/2021 INDICATION: 80 year old male with a history lymphoma referred for bone marrow biopsy EXAM: CT BONE MARROW BIOPSY AND ASPIRATION; CT BIOPSY MEDICATIONS: None. ANESTHESIA/SEDATION: Moderate (conscious) sedation was employed during this procedure. A total of Versed 2.0 mg and Fentanyl 100 mcg was administered intravenously. Moderate Sedation Time: 10 minutes. The patient's level of consciousness and vital signs were monitored continuously by radiology nursing throughout the procedure under my direct supervision. FLUOROSCOPY TIME:  CT COMPLICATIONS: None PROCEDURE: The procedure risks, benefits, and alternatives were explained to the patient. Questions regarding the procedure were encouraged and answered. The patient understands and consents to the procedure. Scout CT of the pelvis was performed for surgical planning purposes. The right posterior pelvis was prepped with Chlorhexidine in a sterile fashion,  and a sterile drape was applied covering the operative field. A sterile gown and sterile gloves were used for the procedure. Local anesthesia was provided with 1% Lidocaine. Right posterior iliac bone was targeted for biopsy. The skin and subcutaneous tissues were infiltrated with 1% lidocaine without epinephrine. A small stab incision was made with an 11 blade scalpel, and an 11 gauge Murphy needle was advanced with CT guidance to the posterior cortex. Manual forced was used to advance the needle through the posterior cortex and the stylet was removed. A bone marrow aspirate was retrieved and passed to a cytotechnologist in the room. The Murphy needle was then advanced without the stylet for a core biopsy. The core biopsy was retrieved and also passed to a cytotechnologist. Manual pressure was used for hemostasis and a sterile dressing was placed. No complications were encountered no significant blood loss was encountered. Patient tolerated the procedure well and remained hemodynamically stable throughout. IMPRESSION: Status post CT-guided bone marrow biopsy, with tissue specimen sent to pathology for complete histopathologic analysis Signed, Dulcy Fanny. Earleen Newport, DO [facility] Radiology Electronically Signed   By: Corrie Mckusick D.O.   On: 02/21/2021 12:27   CT BONE MARROW BIOPSY & ASPIRATION  Result Date: 02/21/2021 INDICATION: 80 year old male with a history lymphoma referred for bone marrow biopsy EXAM: CT BONE MARROW BIOPSY AND ASPIRATION; CT BIOPSY MEDICATIONS: None. ANESTHESIA/SEDATION: Moderate (conscious) sedation was employed during this procedure. A total of Versed 2.0 mg and Fentanyl 100 mcg was administered intravenously. Moderate Sedation Time: 10 minutes. The patient's level of consciousness and vital signs were monitored continuously by radiology nursing throughout the procedure under my direct supervision. FLUOROSCOPY TIME:  CT COMPLICATIONS: None  PROCEDURE: The procedure risks, benefits, and alternatives were explained to the patient. Questions regarding the procedure were encouraged and answered. The patient understands and consents to the procedure. Scout CT of the pelvis was performed for surgical planning purposes. The right posterior pelvis was prepped with Chlorhexidine in a sterile fashion, and a sterile drape was applied  covering the operative field. A sterile gown and sterile gloves were used for the procedure. Local anesthesia was provided with 1% Lidocaine. Right posterior iliac bone was targeted for biopsy. The skin and subcutaneous tissues were infiltrated with 1% lidocaine without epinephrine. A small stab incision was made with an 11 blade scalpel, and an 11 gauge Murphy needle was advanced with CT guidance to the posterior cortex. Manual forced was used to advance the needle through the posterior cortex and the stylet was removed. A bone marrow aspirate was retrieved and passed to a cytotechnologist in the room. The Murphy needle was then advanced without the stylet for a core biopsy. The core biopsy was retrieved and also passed to a cytotechnologist. Manual pressure was used for hemostasis and a sterile dressing was placed. No complications were encountered no significant blood loss was encountered. Patient tolerated the procedure well and remained hemodynamically stable throughout. IMPRESSION: Status post CT-guided bone marrow biopsy, with tissue specimen sent to pathology for complete histopathologic analysis Signed, Dulcy Fanny. Earleen Newport, DO [facility] Radiology Electronically Signed   By: Corrie Mckusick D.O.   On: 02/21/2021 12:27   VAS [place] LOWER EXTREMITY VENOUS (DVT)  Result Date: 02/27/2021  Lower Venous DVT Study Indications: Swelling.  Risk Factors: Cancer. Comparison Study: No prior studies. Performing Technologist: Oliver Hum RVT  Examination Guidelines: A complete evaluation  includes B-mode imaging, spectral Doppler, color Doppler, and power Doppler as needed of all accessible portions of each vessel. Bilateral testing is considered an integral part of a complete examination. Limited examinations for reoccurring indications may be performed as noted. The reflux portion of the exam is performed with the patient in reverse Trendelenburg.  +-----+---------------+---------+-----------+----------+--------------+ RIGHTCompressibilityPhasicitySpontaneityPropertiesThrombus Aging +-----+---------------+---------+-----------+----------+--------------+ CFV  Full           Yes      Yes                                 +-----+---------------+---------+-----------+----------+--------------+   +---------+---------------+---------+-----------+----------+--------------+ LEFT     CompressibilityPhasicitySpontaneityPropertiesThrombus Aging +---------+---------------+---------+-----------+----------+--------------+ CFV      Full           Yes      Yes                                 +---------+---------------+---------+-----------+----------+--------------+ SFJ      Full                                                        +---------+---------------+---------+-----------+----------+--------------+ FV Prox  Full                                                        +---------+---------------+---------+-----------+----------+--------------+ FV Mid   Full                                                        +---------+---------------+---------+-----------+----------+--------------+  FV DistalFull                                                        +---------+---------------+---------+-----------+----------+--------------+ PFV      Full                                                        +---------+---------------+---------+-----------+----------+--------------+ Jordan      Full           Yes      Yes                                  +---------+---------------+---------+-----------+----------+--------------+ PTV      Full                                                        +---------+---------------+---------+-----------+----------+--------------+ PERO     Full                                                        +---------+---------------+---------+-----------+----------+--------------+     Summary: RIGHT: - No evidence of common femoral vein obstruction.  LEFT: - There is no evidence of deep vein thrombosis in the lower extremity.  - No cystic structure found in the popliteal fossa.  *See table(s) above for measurements and observations. Electronically signed by Monica Martinez MD on 02/27/2021 at 3:48:37 PM.    Final     ASSESSMENT & PLAN Keith Jordan 80 y.o. male with medical history significant for splenic marginal zone lymphoma who presents for a follow up visit.   I have reviewed labs, the records, schedule the patient the findings most consistent with a splenic marginal zone lymphoma.  This is confirmed with a CT chest abdomen pelvis which showed only splenic involvement and a bone marrow biopsy which confirmed the diagnosis.  The patient has only leukocytosis and splenomegaly.  As such we will proceed with monotherapy rituximab 375 mg per metered squared q. 7 days x 4 doses.  We will continue to monitor him closely in clinic while he is receiving this treatment.  Today is week 3 of monotherapy rituximab.  We will plan to see him back in clinic in 2 weeks time.  #Splenic Marginal Zone Lymphoma --findings are consistent with a splenic marginal zone lymphoma. --patient completed bone marrow biopsy and a CT chest abdomen pelvis. Disease appear limited to peripheral blood and spleen.  --Treatment of choice with confirmed splenic marginal zone lymphoma is rituximab 375 mg/m q. 7 days x 4 doses. --today is Week 3 of monotherapy rituximab --RTC in 2 weeks after completion of therapy.   No orders of the  defined types were placed in this encounter.   All questions were answered. The patient knows to call the  clinic with any problems, questions or concerns.  A total of more than 30 minutes were spent on this encounter and over half of that time was spent on counseling and coordination of care as outlined above.   Ledell Peoples, MD Department of Hematology/Oncology [place] at [facility] Phone: [phone] Pager: [phone] Email: [email or web address]  03/10/2021 10:04 AM   Literature Support:  Kalpadakis C, Pangalis GA, Dimopoulou MN, Vassilakopoulos TP, Kyrtsonis MC, Korkolopoulou P, Kontopidou FN, Siakantaris MP, Dimitriadou EM, Kokoris SI, Tsaftaridis P, Avalon, Angelopoulou Sequim. Rituximab monotherapy is highly effective in splenic marginal zone lymphoma. Hematol Oncol. 2007 Sep;25(3):127-31.  --The overall response rate was 100%. After treatment, all patients had a complete resolution of splenomegaly along with restoration of their blood counts. Eleven patients (69%) achieved a CR, three (19%) unconfirmed CR and two (12%) a PR. Among the complete responders seven patients had also a molecular remission.

## 2021-03-10 NOTE — Progress Notes (Deleted)
Rapid Infusion Rituximab Pharmacist Evaluation  Keith Jordan is a 80 y.o. male being treated with rituximab for NHL. This patient may be considered for RIR.   A pharmacist has verified the patient tolerated rituximab infusions per the [facility] standard infusion protocol without grade 3-4 infusion reactions. The treatment plan will be updated to reflect RIR if the patient qualifies per the checklist below:   Age > 79 years old Yes   Clinically significant cardiovascular disease No   Circulating lymphocyte count < 5000/uL prior to cycle two Yes  Lab Results  Component Value Date   LYMPHSABS 99.6 (H) 03/10/2021    Prior documented grade 3-4 infusion reaction to rituximab No   Prior documented grade 1-2 infusion reaction to rituximab (If YES, Pharmacist will confirm with Physician if patient is still a candidate for RIR) No   Previous rituximab infusion within the past 6 months Yes   Treatment Plan updated orders to reflect RIR Yes    Keith Jordan does meet the criteria for Rapid Infusion Rituximab. This patient is going to be switched to rapid infusion rituximab.   Elsie Lincoln, PharmD 03/10/21 10:54 AM

## 2021-03-11 ENCOUNTER — Telehealth: Payer: Self-pay | Admitting: Hematology and Oncology

## 2021-03-11 NOTE — Telephone Encounter (Signed)
Scheduled per los. Called and spoke with patient. Confirmed appt 

## 2021-03-14 ENCOUNTER — Other Ambulatory Visit: Payer: Self-pay | Admitting: Hematology and Oncology

## 2021-03-17 ENCOUNTER — Other Ambulatory Visit: Payer: Self-pay

## 2021-03-17 ENCOUNTER — Inpatient Hospital Stay: Payer: Medicare HMO

## 2021-03-17 VITALS — BP 137/70 | HR 85 | Temp 98.6°F | Resp 18

## 2021-03-17 DIAGNOSIS — D72820 Lymphocytosis (symptomatic): Secondary | ICD-10-CM

## 2021-03-17 DIAGNOSIS — E119 Type 2 diabetes mellitus without complications: Secondary | ICD-10-CM | POA: Diagnosis not present

## 2021-03-17 DIAGNOSIS — C8307 Small cell B-cell lymphoma, spleen: Secondary | ICD-10-CM

## 2021-03-17 DIAGNOSIS — M170 Bilateral primary osteoarthritis of knee: Secondary | ICD-10-CM | POA: Diagnosis not present

## 2021-03-17 DIAGNOSIS — R002 Palpitations: Secondary | ICD-10-CM | POA: Diagnosis not present

## 2021-03-17 DIAGNOSIS — R0989 Other specified symptoms and signs involving the circulatory and respiratory systems: Secondary | ICD-10-CM | POA: Diagnosis not present

## 2021-03-17 DIAGNOSIS — Z5112 Encounter for antineoplastic immunotherapy: Secondary | ICD-10-CM | POA: Diagnosis not present

## 2021-03-17 DIAGNOSIS — E785 Hyperlipidemia, unspecified: Secondary | ICD-10-CM | POA: Diagnosis not present

## 2021-03-17 LAB — CMP (CANCER CENTER ONLY)
ALT: 19 U/L (ref 0–44)
AST: 26 U/L (ref 15–41)
Albumin: 4.2 g/dL (ref 3.5–5.0)
Alkaline Phosphatase: 93 U/L (ref 38–126)
Anion gap: 9 (ref 5–15)
BUN: 25 mg/dL — ABNORMAL HIGH (ref 8–23)
CO2: 26 mmol/L (ref 22–32)
Calcium: 9.4 mg/dL (ref 8.9–10.3)
Chloride: 102 mmol/L (ref 98–111)
Creatinine: 1.17 mg/dL (ref 0.61–1.24)
GFR, Estimated: >60 mL/min (ref >60)
Glucose, Bld: 229 mg/dL — ABNORMAL HIGH (ref 70–99)
Potassium: 4.1 mmol/L (ref 3.5–5.1)
Sodium: 137 mmol/L (ref 135–145)
Total Bilirubin: 0.8 mg/dL (ref 0.3–1.2)
Total Protein: 7.2 g/dL (ref 6.5–8.1)

## 2021-03-17 LAB — CBC WITH DIFFERENTIAL (CANCER CENTER ONLY)
Abs Immature Granulocytes: 0.26 10*3/uL — ABNORMAL HIGH (ref 0.00–0.07)
Basophils Absolute: 0.1 10*3/uL (ref 0.0–0.1)
Basophils Relative: 0 %
Eosinophils Absolute: 0.7 10*3/uL — ABNORMAL HIGH (ref 0.0–0.5)
Eosinophils Relative: 1 %
HCT: 44.7 % (ref 39.0–52.0)
Hemoglobin: 14.8 g/dL (ref 13.0–17.0)
Immature Granulocytes: 0 %
Lymphocytes Relative: 90 %
Lymphs Abs: 85.5 10*3/uL — ABNORMAL HIGH (ref 0.7–4.0)
MCH: 30.8 pg (ref 26.0–34.0)
MCHC: 33.1 g/dL (ref 30.0–36.0)
MCV: 93.1 fL (ref 80.0–100.0)
Monocytes Absolute: 3.1 10*3/uL — ABNORMAL HIGH (ref 0.1–1.0)
Monocytes Relative: 3 %
Neutro Abs: 5.1 10*3/uL (ref 1.7–7.7)
Neutrophils Relative %: 6 %
Platelet Count: 146 10*3/uL — ABNORMAL LOW (ref 150–400)
RBC: 4.8 MIL/uL (ref 4.22–5.81)
RDW: 14.3 % (ref 11.5–15.5)
WBC Count: 94.8 10*3/uL (ref 4.0–10.5)
nRBC: 0 % (ref 0.0–0.2)

## 2021-03-17 LAB — LACTATE DEHYDROGENASE: LDH: 186 U/L (ref 98–192)

## 2021-03-17 MED ORDER — DIPHENHYDRAMINE HCL 25 MG PO CAPS
ORAL_CAPSULE | ORAL | Status: AC
  Filled 2021-03-17: qty 2

## 2021-03-17 MED ORDER — RITUXIMAB-PVVR CHEMO 500 MG/50ML IV SOLN
375.0000 mg/m2 | Freq: Once | INTRAVENOUS | Status: AC
  Administered 2021-03-17: 700 mg via INTRAVENOUS
  Filled 2021-03-17: qty 20

## 2021-03-17 MED ORDER — ACETAMINOPHEN 325 MG PO TABS
650.0000 mg | ORAL_TABLET | Freq: Once | ORAL | Status: AC
  Administered 2021-03-17: 650 mg via ORAL

## 2021-03-17 MED ORDER — ACETAMINOPHEN 325 MG PO TABS
ORAL_TABLET | ORAL | Status: AC
  Filled 2021-03-17: qty 2

## 2021-03-17 MED ORDER — SODIUM CHLORIDE 0.9 % IV SOLN
Freq: Once | INTRAVENOUS | Status: AC
Start: 2021-03-17 — End: 2021-03-17
  Filled 2021-03-17: qty 250

## 2021-03-17 MED ORDER — DIPHENHYDRAMINE HCL 25 MG PO CAPS
50.0000 mg | ORAL_CAPSULE | Freq: Once | ORAL | Status: AC
  Administered 2021-03-17: 50 mg via ORAL

## 2021-03-17 NOTE — Progress Notes (Signed)
Critical lab value called from lab. WBC elevated at 94.8. Drucie Ip, RN made aware.

## 2021-03-17 NOTE — Patient Instructions (Signed)
[facility] Discharge Instructions for Patients Receiving Chemotherapy  Today you received the following chemotherapy agents rituxan  To help prevent nausea and vomiting after your treatment, we encourage you to take your nausea medication as directed.   If you develop nausea and vomiting that is not controlled by your nausea medication, call the clinic.   BELOW ARE SYMPTOMS THAT SHOULD BE REPORTED IMMEDIATELY:  *FEVER GREATER THAN 100.5 F  *CHILLS WITH OR WITHOUT FEVER  NAUSEA AND VOMITING THAT IS NOT CONTROLLED WITH YOUR NAUSEA MEDICATION  *UNUSUAL SHORTNESS OF BREATH  *UNUSUAL BRUISING OR BLEEDING  TENDERNESS IN MOUTH AND THROAT WITH OR WITHOUT PRESENCE OF ULCERS  *URINARY PROBLEMS  *BOWEL PROBLEMS  UNUSUAL RASH Items with * indicate a potential emergency and should be followed up as soon as possible.  Feel free to call the clinic should you have any questions or concerns. The clinic phone number is [phone].  Please show the CHEMO ALERT CARD at check-in to the Emergency Department and triage nurse.   

## 2021-03-19 ENCOUNTER — Other Ambulatory Visit: Payer: Self-pay | Admitting: Hematology and Oncology

## 2021-03-19 DIAGNOSIS — D72820 Lymphocytosis (symptomatic): Secondary | ICD-10-CM

## 2021-03-20 ENCOUNTER — Ambulatory Visit: Payer: Medicare HMO | Admitting: Family Medicine

## 2021-03-24 ENCOUNTER — Inpatient Hospital Stay: Payer: Medicare HMO

## 2021-03-24 ENCOUNTER — Encounter: Payer: Self-pay | Admitting: Physician Assistant

## 2021-03-24 ENCOUNTER — Inpatient Hospital Stay: Payer: Medicare HMO | Admitting: Physician Assistant

## 2021-03-24 ENCOUNTER — Other Ambulatory Visit: Payer: Self-pay

## 2021-03-24 VITALS — BP 142/69 | HR 78 | Temp 98.4°F | Resp 13 | Ht 68.0 in | Wt 147.7 lb

## 2021-03-24 DIAGNOSIS — M170 Bilateral primary osteoarthritis of knee: Secondary | ICD-10-CM | POA: Diagnosis not present

## 2021-03-24 DIAGNOSIS — I499 Cardiac arrhythmia, unspecified: Secondary | ICD-10-CM | POA: Insufficient documentation

## 2021-03-24 DIAGNOSIS — D72820 Lymphocytosis (symptomatic): Secondary | ICD-10-CM | POA: Diagnosis not present

## 2021-03-24 DIAGNOSIS — R110 Nausea: Secondary | ICD-10-CM

## 2021-03-24 DIAGNOSIS — E785 Hyperlipidemia, unspecified: Secondary | ICD-10-CM | POA: Diagnosis not present

## 2021-03-24 DIAGNOSIS — C8307 Small cell B-cell lymphoma, spleen: Secondary | ICD-10-CM

## 2021-03-24 DIAGNOSIS — E119 Type 2 diabetes mellitus without complications: Secondary | ICD-10-CM | POA: Diagnosis not present

## 2021-03-24 DIAGNOSIS — R002 Palpitations: Secondary | ICD-10-CM | POA: Diagnosis not present

## 2021-03-24 DIAGNOSIS — R0989 Other specified symptoms and signs involving the circulatory and respiratory systems: Secondary | ICD-10-CM | POA: Diagnosis not present

## 2021-03-24 DIAGNOSIS — Z5112 Encounter for antineoplastic immunotherapy: Secondary | ICD-10-CM | POA: Diagnosis not present

## 2021-03-24 HISTORY — DX: Nausea: R11.0

## 2021-03-24 HISTORY — DX: Cardiac arrhythmia, unspecified: I49.9

## 2021-03-24 LAB — CBC WITH DIFFERENTIAL (CANCER CENTER ONLY)
Abs Immature Granulocytes: 0.12 10*3/uL — ABNORMAL HIGH (ref 0.00–0.07)
Basophils Absolute: 0.3 10*3/uL — ABNORMAL HIGH (ref 0.0–0.1)
Basophils Relative: 1 %
Eosinophils Absolute: 0.7 10*3/uL — ABNORMAL HIGH (ref 0.0–0.5)
Eosinophils Relative: 1 %
HCT: 44.9 % (ref 39.0–52.0)
Hemoglobin: 14.6 g/dL (ref 13.0–17.0)
Immature Granulocytes: 0 %
Lymphocytes Relative: 89 %
Lymphs Abs: 53.4 10*3/uL — ABNORMAL HIGH (ref 0.7–4.0)
MCH: 30.4 pg (ref 26.0–34.0)
MCHC: 32.5 g/dL (ref 30.0–36.0)
MCV: 93.3 fL (ref 80.0–100.0)
Monocytes Absolute: 1.9 10*3/uL — ABNORMAL HIGH (ref 0.1–1.0)
Monocytes Relative: 3 %
Neutro Abs: 3.4 10*3/uL (ref 1.7–7.7)
Neutrophils Relative %: 6 %
Platelet Count: 136 10*3/uL — ABNORMAL LOW (ref 150–400)
RBC: 4.81 MIL/uL (ref 4.22–5.81)
RDW: 14.4 % (ref 11.5–15.5)
WBC Count: 59.8 10*3/uL (ref 4.0–10.5)
nRBC: 0 % (ref 0.0–0.2)

## 2021-03-24 LAB — CMP (CANCER CENTER ONLY)
ALT: 16 U/L (ref 0–44)
AST: 23 U/L (ref 15–41)
Albumin: 4.2 g/dL (ref 3.5–5.0)
Alkaline Phosphatase: 91 U/L (ref 38–126)
Anion gap: 11 (ref 5–15)
BUN: 28 mg/dL — ABNORMAL HIGH (ref 8–23)
CO2: 30 mmol/L (ref 22–32)
Calcium: 9.3 mg/dL (ref 8.9–10.3)
Chloride: 99 mmol/L (ref 98–111)
Creatinine: 1.29 mg/dL — ABNORMAL HIGH (ref 0.61–1.24)
GFR, Estimated: 56 mL/min — ABNORMAL LOW (ref >60)
Glucose, Bld: 197 mg/dL — ABNORMAL HIGH (ref 70–99)
Potassium: 4.2 mmol/L (ref 3.5–5.1)
Sodium: 140 mmol/L (ref 135–145)
Total Bilirubin: 0.9 mg/dL (ref 0.3–1.2)
Total Protein: 6.8 g/dL (ref 6.5–8.1)

## 2021-03-24 LAB — LACTATE DEHYDROGENASE: LDH: 177 U/L (ref 98–192)

## 2021-03-24 MED ORDER — ONDANSETRON HCL 8 MG PO TABS: 8.0000 mg | ORAL_TABLET | Freq: Three times a day (TID) | ORAL | 0 refills | Status: DC | PRN

## 2021-03-24 NOTE — Progress Notes (Signed)
[place] [phone]) [phone]   [phone]  PROGRESS NOTE  Patient Care Team: [name], MD as PCP - General (Family Medicine) Specialists, [facility] as Consulting Physician (Orthopedic Surgery)  Hematological/Oncological History # Splenic Marginal Zone Lymphoma 01/30/2021: WBC 186.3, Hgb 13.5, MCV 96.3, Plt 132. Referred to Emergency Department. 02/05/2021: establish care with Dr. Lorenso Courier  02/24/2021: Week 1 of Monotherapy Rituximab 03/03/2021:  Week 2 of Monotherapy Rituximab 03/10/2021:  Week 3 of Monotherapy Rituximab 03/17/2021: Week 4 of Monotherapy Rituximab  Interval History:  Keith Jordan 80 y.o. male with medical history significant for splenic marginal zone lymphoma who presents for a follow up visit. The patient's last visit was on 03/10/2021. In the interim since the last visit he has completed 4 weeks of monotherapy rituximab.   On exam today Keith Jordan notes that his energy and appetite are slowly improving after completion of Rituximab therapy last week. He reported flu like symptoms for 2-3 days after the infusion that resolved on its own. He continues to complete his ADLs on his own.  Patient reports mild nausea without any vomiting episodes. He denies any abdominal pain and denies any changes to his bowel habits. Patient reports episodes of irregular heart beat, last 30-45 minutes. He denies any shortness of breath or chest pain. He has no other complaints.   A full 10 point ROS is listed below.  MEDICAL HISTORY:  Past Medical History:  Diagnosis Date  . Hyperlipidemia   . Ischemic optic neuropathy 2006   [place] [facility]  . Personal history of colonic adenoma 07/30/2008  . Prediabetes 07/05/2016   A1c 6.5% 03/2014   . Primary osteoarthritis of knee    bilateral s/p L knee replacement, receives R knee injections [name], Noemi Chapel)    SURGICAL HISTORY: Past Surgical History:  Procedure Laterality Date  .  COLONOSCOPY  12/2013   no polyps, no rpt due [name])  . CYST EXCISION  01/2020  . TOTAL KNEE ARTHROPLASTY Left 2005   [name]    SOCIAL HISTORY: Social History   Socioeconomic History  . Marital status: Married    Spouse name: Not on file  . Number of children: Not on file  . Years of education: Not on file  . Highest education level: Not on file  Occupational History  . Not on file  Tobacco Use  . Smoking status: Never Smoker  . Smokeless tobacco: Never Used  Vaping Use  . Vaping Use: Never used  Substance and Sexual Activity  . Alcohol use: Yes    Comment: occasional  . Drug use: No  . Sexual activity: Not Currently  Other Topics Concern  . Not on file  Social History Narrative   Lives with wife, 2 cats   Occ: retired Personal assistant   Activity: works in yard   Diet: some water, fruits/vegetables daily   Social Determinants of Radio broadcast assistant Strain: [place]   . Difficulty of Paying Living Expenses: Not hard at all  Food Insecurity: No Food Insecurity  . Worried About Charity fundraiser in the Last Year: Never true  . Ran Out of Food in the Last Year: Never true  Transportation Needs: No Transportation Needs  . Lack of Transportation (Medical): No  . Lack of Transportation (Non-Medical): No  Physical Activity: Inactive  . Days of Exercise per Week: 0 days  . Minutes of Exercise per Session: 0 min  Stress: No Stress Concern Present  . Feeling of  Stress : Not at all  Social Connections: Not on file  Intimate Partner Violence: Not At Risk  . Fear of Current or Ex-Partner: No  . Emotionally Abused: No  . Physically Abused: No  . Sexually Abused: No    FAMILY HISTORY: Family History  Problem Relation Age of Onset  . Blindness Mother 20       ?stroke in eyes  . Lung disease Father        Ecologist  . Arthritis Father   . Colon cancer Neg Hx   . Pancreatic cancer Neg Hx   . Stomach cancer Neg Hx   . Esophageal cancer Neg Hx   . Rectal cancer  Neg Hx   . Cancer Neg Hx   . Diabetes Neg Hx   . CAD Neg Hx   . Stroke Neg Hx     ALLERGIES:  has No Known Allergies.  MEDICATIONS:  Current Outpatient Medications  Medication Sig Dispense Refill  . acetaminophen (TYLENOL) 500 MG tablet Take 500 mg by mouth every 6 (six) hours as needed for mild pain.    Marland Kitchen allopurinol (ZYLOPRIM) 300 MG tablet TAKE 1 TABLET BY MOUTH EVERY DAY 30 tablet 1  . Ascorbic Acid (VITAMIN C) 1000 MG tablet Take 500 mg by mouth 3 (three) times a week.    Marland Kitchen aspirin EC 81 MG tablet Take 1 tablet (81 mg total) by mouth 2 (two) times a week. (Patient taking differently: Take 81 mg by mouth 3 (three) times a week.)    . atorvastatin (LIPITOR) 10 MG tablet Take 1 tablet (10 mg total) by mouth daily. 90 tablet 3  . carboxymethylcellulose (REFRESH PLUS) 0.5 % SOLN Place 1 drop into both eyes 2 (two) times daily as needed (dry eyes).    . Multiple Vitamins-Minerals (PRESERVISION AREDS) CAPS Take 1 capsule by mouth in the morning and at bedtime. 180 capsule 3  . sodium chloride (OCEAN) 0.65 % SOLN nasal spray Place 1 spray into both nostrils as needed for congestion.     No current facility-administered medications for this visit.    REVIEW OF SYSTEMS:   Constitutional: ( - ) fevers, ( - )  chills , ( - ) night sweats Eyes: ( - ) blurriness of vision, ( - ) double vision, ( - ) watery eyes Ears, nose, mouth, throat, and face: ( - ) mucositis, ( - ) sore throat Respiratory: ( - ) cough, ( - ) dyspnea, ( - ) wheezes Cardiovascular: ( - ) palpitation, ( - ) chest discomfort, ( - ) lower extremity swelling Gastrointestinal:  ( + ) nausea, ( - ) heartburn, ( - ) change in bowel habits Skin: ( - ) abnormal skin rashes Lymphatics: ( - ) new lymphadenopathy, ( - ) easy bruising Neurological: ( - ) numbness, ( + ) tingling, ( - ) new weaknesses Behavioral/Psych: ( - ) mood change, ( - ) new changes  All other systems were reviewed with the patient and are negative.  PHYSICAL  EXAMINATION: ECOG PERFORMANCE STATUS: 1 - Symptomatic but completely ambulatory  Vitals:   03/24/21 1024  BP: (!) 142/69  Pulse: 78  Resp: 13  Temp: 98.4 F (36.9 C)  SpO2: 98%   Filed Weights   03/24/21 1024  Weight: 147 lb 11.2 oz (67 kg)    GENERAL: well appearing elderly Caucasian male alert, no distress and comfortable SKIN: skin color, texture, turgor are normal, no rashes or significant lesions EYES: conjunctiva are pink and  non-injected, sclera clear LUNGS: clear to auscultation and percussion with normal breathing effort HEART: regular rate & rhythm and no murmurs and no lower extremity edema Musculoskeletal: no cyanosis of digits and no clubbing  PSYCH: alert & oriented x 3, fluent speech NEURO: no focal motor/sensory deficits  LABORATORY DATA:  I have reviewed the data as listed CBC Latest Ref Rng & Units 03/24/2021 03/17/2021 03/10/2021  WBC 4.0 - 10.5 K/uL 59.8(HH) 94.8(HH) 109.8(HH)  Hemoglobin 13.0 - 17.0 g/dL 14.6 14.8 13.4  Hematocrit 39.0 - 52.0 % 44.9 44.7 41.1  Platelets 150 - 400 K/uL 136(L) 146(L) 142(L)    CMP Latest Ref Rng & Units 03/24/2021 03/17/2021 03/10/2021  Glucose 70 - 99 mg/dL 197(H) 229(H) 246(H)  BUN 8 - 23 mg/dL 28(H) 25(H) 22  Creatinine 0.61 - 1.24 mg/dL 1.29(H) 1.17 1.20  Sodium 135 - 145 mmol/L 140 137 137  Potassium 3.5 - 5.1 mmol/L 4.2 4.1 3.8  Chloride 98 - 111 mmol/L 99 102 103  CO2 22 - 32 mmol/L $RemoveB'30 26 27  'gFRPHgsK$ Calcium 8.9 - 10.3 mg/dL 9.3 9.4 8.8(L)  Total Protein 6.5 - 8.1 g/dL 6.8 7.2 6.6  Total Bilirubin 0.3 - 1.2 mg/dL 0.9 0.8 0.5  Alkaline Phos 38 - 126 U/L 91 93 91  AST 15 - 41 U/L $Remo'23 26 26  'KzYvn$ ALT 0 - 44 U/L $Remo'16 19 18   'PcCTf$ RADIOGRAPHIC STUDIES: VAS [place] LOWER EXTREMITY VENOUS (DVT)  Result Date: 02/27/2021  Lower Venous DVT Study Indications: Swelling.  Risk Factors: Cancer. Comparison Study: No prior studies. Performing Technologist: Oliver Hum RVT  Examination Guidelines: A complete evaluation includes B-mode imaging,  spectral Doppler, color Doppler, and power Doppler as needed of all accessible portions of each vessel. Bilateral testing is considered an integral part of a complete examination. Limited examinations for reoccurring indications may be performed as noted. The reflux portion of the exam is performed with the patient in reverse Trendelenburg.  +-----+---------------+---------+-----------+----------+--------------+ RIGHTCompressibilityPhasicitySpontaneityPropertiesThrombus Aging +-----+---------------+---------+-----------+----------+--------------+ CFV  Full           Yes      Yes                                 +-----+---------------+---------+-----------+----------+--------------+   +---------+---------------+---------+-----------+----------+--------------+ LEFT     CompressibilityPhasicitySpontaneityPropertiesThrombus Aging +---------+---------------+---------+-----------+----------+--------------+ CFV      Full           Yes      Yes                                 +---------+---------------+---------+-----------+----------+--------------+ SFJ      Full                                                        +---------+---------------+---------+-----------+----------+--------------+ FV Prox  Full                                                        +---------+---------------+---------+-----------+----------+--------------+ FV Mid   Full                                                        +---------+---------------+---------+-----------+----------+--------------+  FV DistalFull                                                        +---------+---------------+---------+-----------+----------+--------------+ PFV      Full                                                        +---------+---------------+---------+-----------+----------+--------------+ Jordan      Full           Yes      Yes                                  +---------+---------------+---------+-----------+----------+--------------+ PTV      Full                                                        +---------+---------------+---------+-----------+----------+--------------+ PERO     Full                                                        +---------+---------------+---------+-----------+----------+--------------+     Summary: RIGHT: - No evidence of common femoral vein obstruction.  LEFT: - There is no evidence of deep vein thrombosis in the lower extremity.  - No cystic structure found in the popliteal fossa.  *See table(s) above for measurements and observations. Electronically signed by Monica Martinez MD on 02/27/2021 at 3:48:37 PM.    Final     ASSESSMENT & PLAN Keith Jordan 80 y.o. male with medical history significant for splenic marginal zone lymphoma. CT CAP from 02/13/21  showed only splenic involvement and a bone marrow biopsy from 02/21/21 confirmed the diagnosis.  The patient has only leukocytosis and splenomegaly.  The recommendation is monotherapy rituximab 375 mg per metered squared q. 7 days x 4 doses.   Patient returns today after completion of 4th dose of rituxumab. Patient is doing well without any significant limitations. Labs were reviewed that shows continued improvement of leukocytosis and stable thrombocytopenia. Plan for patient to return to the clinic in 2 weeks for repeat lab check.   #Splenic Marginal Zone Lymphoma --findings are consistent with a splenic marginal zone lymphoma. --patient completed bone marrow biopsy and a CT chest abdomen pelvis. Disease appear limited to peripheral blood and spleen.  --Treatment of choice with confirmed splenic marginal zone lymphoma is rituximab 375 mg/m q. 7 days x 4 doses --Due to persistent lymphocytosis, recommend to continue with allopurinol to prevent TLS. --RTC in 2 weeks with labs.   #Nausea --sent prescription of Zofran 8 mg q 8 hours PRN.   #Irregular heart  beat --Physical exam finding was consistent with regular rhythm and rate.  --Most recent EKG from 01/31/21 showed sinus rhythm.  --Advised referral to cardiology for further workup including Holter monitor  No orders of the defined types were placed in this encounter.   All questions were answered. The patient knows to call the clinic with any problems, questions or concerns.  A total of more than 30 minutes were spent on this encounter and over half of that time was spent on counseling and coordination of care as outlined above.   Dede Query PA-C Department of Hematology/Oncology [place] at [facility] Phone: [phone]   03/24/2021 12:29 PM   Patient was seen with Dr. Lorenso Courier.   I have read the above note and personally examined the patient. I agree with the assessment and plan as noted above.  Briefly Keith Jordan is a 80 year old male with medical history significant for splenic marginal zone lymphoma who has completed 4 weeks of monotherapy rituximab. He notes some chills for a few days after his infusions, but otherwise tolerated it well. He has no questions or complaints today. Will plan to see him back in 2 weeks with repeat labs to assure his WBC is downtrending.    Ledell Peoples, MD Department of Hematology/Oncology [place] at [facility] Phone: [phone] Pager: [phone] Email: [email or web address]

## 2021-03-26 ENCOUNTER — Telehealth: Payer: Self-pay | Admitting: Physician Assistant

## 2021-03-26 NOTE — Telephone Encounter (Signed)
Scheduled per los. Called and spoke with patient. Confirmed appt 

## 2021-04-03 ENCOUNTER — Other Ambulatory Visit: Payer: Self-pay

## 2021-04-03 ENCOUNTER — Encounter: Payer: Self-pay | Admitting: Internal Medicine

## 2021-04-03 ENCOUNTER — Ambulatory Visit (INDEPENDENT_AMBULATORY_CARE_PROVIDER_SITE_OTHER): Payer: Medicare HMO | Admitting: Internal Medicine

## 2021-04-03 VITALS — BP 124/70 | HR 78 | Temp 98.0°F | Ht 68.0 in | Wt 152.0 lb

## 2021-04-03 DIAGNOSIS — H938X3 Other specified disorders of ear, bilateral: Secondary | ICD-10-CM

## 2021-04-03 HISTORY — DX: Other specified disorders of ear, bilateral: H93.8X3

## 2021-04-03 MED ORDER — PREDNISONE 20 MG PO TABS: 40.0000 mg | ORAL_TABLET | Freq: Every day | ORAL | 0 refills | Status: DC

## 2021-04-03 NOTE — Assessment & Plan Note (Signed)
No infection Not a reaction to rituxan Likely eustachian tube dysfunction --but unclear why Will try 3 days of prednisone Consider ENT evaluation if not improving

## 2021-04-03 NOTE — Patient Instructions (Signed)
If your ears don't get better with the prednisone, the next step would be to see a ENT specialist.

## 2021-04-03 NOTE — Progress Notes (Signed)
Subjective:    Patient ID: Keith Jordan, male    DOB: 1941-07-06, 80 y.o.   MRN: [id number]  HPI Here due to ear pressure This visit occurred during the SARS-CoV-2 public health emergency.  Safety protocols were in place, including screening questions prior to the visit, additional usage of staff PPE, and extensive cleaning of exam room while observing appropriate contact time as indicated for disinfecting solutions.   Started about 2 weeks ago Feels like a head cold--but has pressure in his ears Some balance issues --upon standing No vertigo No fever No recent travel or flight Worst in AM--then improves  Recently finished rituxan for lymphoma Some infusion reactions  Current Outpatient Medications on File Prior to Visit  Medication Sig Dispense Refill  . acetaminophen (TYLENOL) 500 MG tablet Take 500 mg by mouth every 6 (six) hours as needed for mild pain.    Marland Kitchen allopurinol (ZYLOPRIM) 300 MG tablet TAKE 1 TABLET BY MOUTH EVERY DAY 30 tablet 1  . Ascorbic Acid (VITAMIN C) 1000 MG tablet Take 500 mg by mouth 3 (three) times a week.    Marland Kitchen aspirin EC 81 MG tablet Take 1 tablet (81 mg total) by mouth 2 (two) times a week. (Patient taking differently: Take 81 mg by mouth 3 (three) times a week.)    . atorvastatin (LIPITOR) 10 MG tablet Take 1 tablet (10 mg total) by mouth daily. 90 tablet 3  . carboxymethylcellulose (REFRESH PLUS) 0.5 % SOLN Place 1 drop into both eyes 2 (two) times daily as needed (dry eyes).    . Multiple Vitamins-Minerals (PRESERVISION AREDS) CAPS Take 1 capsule by mouth in the morning and at bedtime. 180 capsule 3  . ondansetron (ZOFRAN) 8 MG tablet Take 1 tablet (8 mg total) by mouth every 8 (eight) hours as needed for nausea or vomiting. 30 tablet 0  . sodium chloride (OCEAN) 0.65 % SOLN nasal spray Place 1 spray into both nostrils as needed for congestion.     No current facility-administered medications on file prior to visit.    No Known Allergies  Past  Medical History:  Diagnosis Date  . Hyperlipidemia   . Ischemic optic neuropathy 2006   [facility] [facility]  . Personal history of colonic adenoma 07/30/2008  . Prediabetes 07/05/2016   A1c 6.5% 03/2014   . Primary osteoarthritis of knee    bilateral s/p L knee replacement, receives R knee injections Francis Gaines)    Past Surgical History:  Procedure Laterality Date  . COLONOSCOPY  12/2013   no polyps, no rpt due [name])  . CYST EXCISION  01/2020  . TOTAL KNEE ARTHROPLASTY Left 2005   [name]    Family History  Problem Relation Age of Onset  . Blindness Mother 74       ?stroke in eyes  . Lung disease Father        Ecologist  . Arthritis Father   . Colon cancer Neg Hx   . Pancreatic cancer Neg Hx   . Stomach cancer Neg Hx   . Esophageal cancer Neg Hx   . Rectal cancer Neg Hx   . Cancer Neg Hx   . Diabetes Neg Hx   . CAD Neg Hx   . Stroke Neg Hx     Social History   Socioeconomic History  . Marital status: Married    Spouse name: Not on file  . Number of children: Not on file  . Years of education: Not on file  .  Highest education level: Not on file  Occupational History  . Not on file  Tobacco Use  . Smoking status: Never Smoker  . Smokeless tobacco: Never Used  Vaping Use  . Vaping Use: Never used  Substance and Sexual Activity  . Alcohol use: Yes    Comment: occasional  . Drug use: No  . Sexual activity: Not Currently  Other Topics Concern  . Not on file  Social History Narrative   Lives with wife, 2 cats   Occ: retired Personal assistant   Activity: works in yard   Diet: some water, fruits/vegetables daily   Social Determinants of Radio broadcast assistant Strain: [place]   . Difficulty of Paying Living Expenses: Not hard at all  Food Insecurity: No Food Insecurity  . Worried About Charity fundraiser in the Last Year: Never true  . Ran Out of Food in the Last Year: Never true  Transportation Needs: No Transportation Needs  . Lack of  Transportation (Medical): No  . Lack of Transportation (Non-Medical): No  Physical Activity: Inactive  . Days of Exercise per Week: 0 days  . Minutes of Exercise per Session: 0 min  Stress: No Stress Concern Present  . Feeling of Stress : Not at all  Social Connections: Not on file  Intimate Partner Violence: Not At Risk  . Fear of Current or Ex-Partner: No  . Emotionally Abused: No  . Physically Abused: No  . Sexually Abused: No   Review of Systems Chronic tinnitus Hearing is stable---has hearing aides No head or nasal congestion--but occasional frontal tenderness    Objective:   Physical Exam Constitutional:      Appearance: Normal appearance.  HENT:     Right Ear: Ear canal and external ear normal.     Left Ear: Ear canal and external ear normal.     Ears:     Comments: No tragal tenderness No redness in TMs-----?fluid on right and possibly on left Eyes:     Comments: No nystagmus  Neurological:     General: No focal deficit present.     Mental Status: He is alert.     Comments: Normal gait Babinski absent             Assessment & Plan:

## 2021-04-07 ENCOUNTER — Inpatient Hospital Stay: Payer: Medicare HMO | Attending: Hematology and Oncology | Admitting: Physician Assistant

## 2021-04-07 ENCOUNTER — Inpatient Hospital Stay: Payer: Medicare HMO

## 2021-04-07 ENCOUNTER — Encounter: Payer: Self-pay | Admitting: Physician Assistant

## 2021-04-07 ENCOUNTER — Other Ambulatory Visit: Payer: Self-pay

## 2021-04-07 VITALS — BP 141/69 | HR 82 | Temp 97.9°F | Resp 16 | Ht 68.0 in | Wt 150.9 lb

## 2021-04-07 DIAGNOSIS — R002 Palpitations: Secondary | ICD-10-CM | POA: Diagnosis not present

## 2021-04-07 DIAGNOSIS — D72820 Lymphocytosis (symptomatic): Secondary | ICD-10-CM | POA: Diagnosis not present

## 2021-04-07 DIAGNOSIS — M170 Bilateral primary osteoarthritis of knee: Secondary | ICD-10-CM | POA: Insufficient documentation

## 2021-04-07 DIAGNOSIS — C8307 Small cell B-cell lymphoma, spleen: Secondary | ICD-10-CM | POA: Insufficient documentation

## 2021-04-07 DIAGNOSIS — E119 Type 2 diabetes mellitus without complications: Secondary | ICD-10-CM | POA: Insufficient documentation

## 2021-04-07 DIAGNOSIS — R0989 Other specified symptoms and signs involving the circulatory and respiratory systems: Secondary | ICD-10-CM | POA: Insufficient documentation

## 2021-04-07 DIAGNOSIS — D72829 Elevated white blood cell count, unspecified: Secondary | ICD-10-CM | POA: Insufficient documentation

## 2021-04-07 DIAGNOSIS — E785 Hyperlipidemia, unspecified: Secondary | ICD-10-CM | POA: Diagnosis not present

## 2021-04-07 LAB — CMP (CANCER CENTER ONLY)
ALT: 11 U/L (ref 0–44)
AST: 21 U/L (ref 15–41)
Albumin: 4 g/dL (ref 3.5–5.0)
Alkaline Phosphatase: 77 U/L (ref 38–126)
Anion gap: 12 (ref 5–15)
BUN: 22 mg/dL (ref 8–23)
CO2: 26 mmol/L (ref 22–32)
Calcium: 9 mg/dL (ref 8.9–10.3)
Chloride: 103 mmol/L (ref 98–111)
Creatinine: 1.13 mg/dL (ref 0.61–1.24)
GFR, Estimated: >60 mL/min (ref >60)
Glucose, Bld: 163 mg/dL — ABNORMAL HIGH (ref 70–99)
Potassium: 4.6 mmol/L (ref 3.5–5.1)
Sodium: 141 mmol/L (ref 135–145)
Total Bilirubin: 0.7 mg/dL (ref 0.3–1.2)
Total Protein: 6.6 g/dL (ref 6.5–8.1)

## 2021-04-07 LAB — CBC WITH DIFFERENTIAL (CANCER CENTER ONLY)
Abs Immature Granulocytes: 0.28 10*3/uL — ABNORMAL HIGH (ref 0.00–0.07)
Basophils Absolute: 0.1 10*3/uL (ref 0.0–0.1)
Basophils Relative: 0 %
Eosinophils Absolute: 0.5 10*3/uL (ref 0.0–0.5)
Eosinophils Relative: 1 %
HCT: 40.3 % (ref 39.0–52.0)
Hemoglobin: 13 g/dL (ref 13.0–17.0)
Immature Granulocytes: 0 %
Lymphocytes Relative: 93 %
Lymphs Abs: 92.3 10*3/uL — ABNORMAL HIGH (ref 0.7–4.0)
MCH: 30.9 pg (ref 26.0–34.0)
MCHC: 32.3 g/dL (ref 30.0–36.0)
MCV: 95.7 fL (ref 80.0–100.0)
Monocytes Absolute: 2.1 10*3/uL — ABNORMAL HIGH (ref 0.1–1.0)
Monocytes Relative: 2 %
Neutro Abs: 4.1 10*3/uL (ref 1.7–7.7)
Neutrophils Relative %: 4 %
Platelet Count: 150 10*3/uL (ref 150–400)
RBC: 4.21 MIL/uL — ABNORMAL LOW (ref 4.22–5.81)
RDW: 15.6 % — ABNORMAL HIGH (ref 11.5–15.5)
WBC Count: 99.3 10*3/uL (ref 4.0–10.5)
nRBC: 0 % (ref 0.0–0.2)

## 2021-04-07 LAB — LACTATE DEHYDROGENASE: LDH: 160 U/L (ref 98–192)

## 2021-04-07 NOTE — Progress Notes (Signed)
[place] [phone]   [phone]  PROGRESS NOTE  Patient Care Team: [name], MD as PCP - General (Family Medicine) Specialists, [facility] as Consulting Physician (Orthopedic Surgery)  Hematological/Oncological History # Splenic Marginal Zone Lymphoma 01/30/2021: WBC 186.3, Hgb 13.5, MCV 96.3, Plt 132. Referred to Emergency Department. 02/05/2021: establish care with Dr. Lorenso Courier  02/24/2021: Week 1 of Monotherapy Rituximab 03/03/2021:  Week 2 of Monotherapy Rituximab 03/10/2021:  Week 3 of Monotherapy Rituximab 03/17/2021: Week 4 of Monotherapy Rituximab  Interval History:  Keith Jordan 80 y.o. male with medical history significant for splenic marginal zone lymphoma who presents for a follow up visit. The patient's last visit was on 03/24/2021. In the interim since the last visit he has completed 4 weeks of monotherapy rituximab.   On exam today Mr. Rase notes that his energy and appetite continue to improve after completion of Rituximab therapy last week. He is able to complete his ADLs on his own.  Patient reports intermittent episodes of nausea without any vomiting episodes. He denies any abdominal pain and denies any changes to his bowel habits. Patient reports increased frequency of  irregular heart beats, now occurring every other day. He denies any shortness of breath or chest pain. Last week, patient experienced low back pain for 1-2 days that improved with massage. Patient has hot flashes but denies any fevers, chills or night sweats. He has no other complaints.   A full 10 point ROS is listed below.  MEDICAL HISTORY:  Past Medical History:  Diagnosis Date  . Hyperlipidemia   . Ischemic optic neuropathy 2006   [place] [facility]  . Personal history of colonic adenoma 07/30/2008  . Prediabetes 07/05/2016   A1c 6.5% 03/2014   . Primary osteoarthritis of knee    bilateral s/p L knee replacement, receives R knee  injections [name], Noemi Chapel)    SURGICAL HISTORY: Past Surgical History:  Procedure Laterality Date  . COLONOSCOPY  12/2013   no polyps, no rpt due [name])  . CYST EXCISION  01/2020  . TOTAL KNEE ARTHROPLASTY Left 2005   [name]    SOCIAL HISTORY: Social History   Socioeconomic History  . Marital status: Married    Spouse name: Not on file  . Number of children: Not on file  . Years of education: Not on file  . Highest education level: Not on file  Occupational History  . Not on file  Tobacco Use  . Smoking status: Never Smoker  . Smokeless tobacco: Never Used  Vaping Use  . Vaping Use: Never used  Substance and Sexual Activity  . Alcohol use: Yes    Comment: occasional  . Drug use: No  . Sexual activity: Not Currently  Other Topics Concern  . Not on file  Social History Narrative   Lives with wife, 2 cats   Occ: retired Personal assistant   Activity: works in yard   Diet: some water, fruits/vegetables daily   Social Determinants of Radio broadcast assistant Strain: [place]   . Difficulty of Paying Living Expenses: Not hard at all  Food Insecurity: No Food Insecurity  . Worried About Charity fundraiser in the Last Year: Never true  . Ran Out of Food in the Last Year: Never true  Transportation Needs: No Transportation Needs  . Lack of Transportation (Medical): No  . Lack of Transportation (Non-Medical): No  Physical Activity: Inactive  . Days of Exercise per Week: 0 days  .  Minutes of Exercise per Session: 0 min  Stress: No Stress Concern Present  . Feeling of Stress : Not at all  Social Connections: Not on file  Intimate Partner Violence: Not At Risk  . Fear of Current or Ex-Partner: No  . Emotionally Abused: No  . Physically Abused: No  . Sexually Abused: No    FAMILY HISTORY: Family History  Problem Relation Age of Onset  . Blindness Mother 38       ?stroke in eyes  . Lung disease Father        Ecologist  . Arthritis Father   . Colon cancer Neg Hx    . Pancreatic cancer Neg Hx   . Stomach cancer Neg Hx   . Esophageal cancer Neg Hx   . Rectal cancer Neg Hx   . Cancer Neg Hx   . Diabetes Neg Hx   . CAD Neg Hx   . Stroke Neg Hx     ALLERGIES:  has No Known Allergies.  MEDICATIONS:  Current Outpatient Medications  Medication Sig Dispense Refill  . acetaminophen (TYLENOL) 500 MG tablet Take 500 mg by mouth every 6 (six) hours as needed for mild pain.    Marland Kitchen allopurinol (ZYLOPRIM) 300 MG tablet TAKE 1 TABLET BY MOUTH EVERY DAY 30 tablet 1  . Ascorbic Acid (VITAMIN C) 1000 MG tablet Take 500 mg by mouth 3 (three) times a week.    Marland Kitchen aspirin EC 81 MG tablet Take 1 tablet (81 mg total) by mouth 2 (two) times a week. (Patient taking differently: Take 81 mg by mouth 3 (three) times a week.)    . atorvastatin (LIPITOR) 10 MG tablet Take 1 tablet (10 mg total) by mouth daily. 90 tablet 3  . carboxymethylcellulose (REFRESH PLUS) 0.5 % SOLN Place 1 drop into both eyes 2 (two) times daily as needed (dry eyes).    . Multiple Vitamins-Minerals (PRESERVISION AREDS) CAPS Take 1 capsule by mouth in the morning and at bedtime. 180 capsule 3  . ondansetron (ZOFRAN) 8 MG tablet Take 1 tablet (8 mg total) by mouth every 8 (eight) hours as needed for nausea or vomiting. 30 tablet 0  . predniSONE (DELTASONE) 20 MG tablet Take 2 tablets (40 mg total) by mouth daily. 6 tablet 0  . sodium chloride (OCEAN) 0.65 % SOLN nasal spray Place 1 spray into both nostrils as needed for congestion.     No current facility-administered medications for this visit.    REVIEW OF SYSTEMS:   Constitutional: ( - ) fevers, ( - )  chills , ( - ) night sweats Eyes: ( - ) blurriness of vision, ( - ) double vision, ( - ) watery eyes Ears, nose, mouth, throat, and face: ( - ) mucositis, ( - ) sore throat Respiratory: ( - ) cough, ( - ) dyspnea, ( - ) wheezes Cardiovascular: ( - ) palpitation, ( - ) chest discomfort, ( - ) lower extremity swelling Gastrointestinal:  ( + ) nausea, (  - ) heartburn, ( - ) change in bowel habits Skin: ( - ) abnormal skin rashes Lymphatics: ( - ) new lymphadenopathy, ( - ) easy bruising Neurological: ( - ) numbness, ( + ) tingling, ( - ) new weaknesses Behavioral/Psych: ( - ) mood change, ( - ) new changes  All other systems were reviewed with the patient and are negative.  PHYSICAL EXAMINATION: ECOG PERFORMANCE STATUS: 1 - Symptomatic but completely ambulatory  Vitals:   04/07/21 1257  BP: Marland Kitchen)  141/69  Pulse: 82  Resp: 16  Temp: 97.9 F (36.6 C)  SpO2: 97%   Filed Weights   04/07/21 1257  Weight: 150 lb 14.4 oz (68.4 kg)    GENERAL: well appearing elderly Caucasian male alert, no distress and comfortable SKIN: skin color, texture, turgor are normal, no rashes or significant lesions EYES: conjunctiva are pink and non-injected, sclera clear LUNGS: clear to auscultation and percussion with normal breathing effort HEART: regular rate & rhythm and no murmurs and no lower extremity edema Musculoskeletal: no cyanosis of digits and no clubbing  PSYCH: alert & oriented x 3, fluent speech NEURO: no focal motor/sensory deficits  LABORATORY DATA:  I have reviewed the data as listed CBC Latest Ref Rng & Units 04/07/2021 03/24/2021 03/17/2021  WBC 4.0 - 10.5 K/uL 99.3(HH) 59.8(HH) 94.8(HH)  Hemoglobin 13.0 - 17.0 g/dL 13.0 14.6 14.8  Hematocrit 39.0 - 52.0 % 40.3 44.9 44.7  Platelets 150 - 400 K/uL 150 136(L) 146(L)    CMP Latest Ref Rng & Units 03/24/2021 03/17/2021 03/10/2021  Glucose 70 - 99 mg/dL 197(H) 229(H) 246(H)  BUN 8 - 23 mg/dL 28(H) 25(H) 22  Creatinine 0.61 - 1.24 mg/dL 1.29(H) 1.17 1.20  Sodium 135 - 145 mmol/L 140 137 137  Potassium 3.5 - 5.1 mmol/L 4.2 4.1 3.8  Chloride 98 - 111 mmol/L 99 102 103  CO2 22 - 32 mmol/L _0 Calcium 8.9 - 10.3 mg/dL 9.3 9.4 8.8(L)  Total Protein 6.5 - 8.1 g/dL 6.8 7.2 6.6  Total Bilirubin 0.3 - 1.2 mg/dL 0.9 0.8 0.5  Alkaline Phos 38 - 126 U/L 91 93 91  AST 15 - 41 U/L _1 ALT 0 - 44 U/L _2 RADIOGRAPHIC STUDIES: No results found.  ASSESSMENT & PLAN DANEL REQUENA 80 y.o. male with medical history significant for splenic marginal zone lymphoma. CT CAP from 02/13/21  showed only splenic involvement and a bone marrow biopsy from 02/21/21 confirmed the diagnosis.  The patient has only leukocytosis and splenomegaly.  The recommendation is monotherapy rituximab 375 mg per metered squared q. 7 days x 4 doses.   Patient returns today after completion of 4th dose of rituxumab. Patient is doing well without any significant limitations. Labs from today show worsening leukocytosis but platelet count has normalized. Plan for patient to return to the clinic in 2 weeks for repeat lab check.   #Splenic Marginal Zone Lymphoma --findings are consistent with a splenic marginal zone lymphoma. --patient completed bone marrow biopsy and a CT chest abdomen pelvis. Disease appear limited to peripheral blood and spleen.  --Treatment of choice with confirmed splenic marginal zone lymphoma is rituximab 375 mg/m q. 7 days x 4 doses --Due to persistent lymphocytosis, recommend to continue with allopurinol to prevent TLS. --RTC to see Dr. Lorenso Courier in 2 weeks with labs.   #Nausea --Take Zofran 8 mg q 8 hours PRN.   #Irregular heart beat --Physical exam finding was consistent with regular rhythm and rate.  --Most recent EKG from 01/31/21 showed sinus rhythm.  --Made referral to cardiology for further workup which has been scheduled for 04/17/21.   No orders of the defined types were placed in this encounter.   All questions were answered. The patient knows to call the clinic with any problems, questions or concerns.  A total of more than 20 minutes were spent on this encounter and over half of that time was spent on counseling and coordination of care  as outlined above.   Lincoln Brigham PA-C Department of Hematology/Oncology [place] at [facility] Phone: [phone]

## 2021-04-09 ENCOUNTER — Telehealth: Payer: Self-pay | Admitting: Physician Assistant

## 2021-04-09 NOTE — Telephone Encounter (Signed)
Scheduled per los. Called and spoke with patient. Confirmed appt 

## 2021-04-17 ENCOUNTER — Ambulatory Visit: Payer: Medicare HMO | Admitting: Cardiology

## 2021-04-17 ENCOUNTER — Encounter: Payer: Self-pay | Admitting: Cardiology

## 2021-04-17 ENCOUNTER — Other Ambulatory Visit: Payer: Self-pay

## 2021-04-17 ENCOUNTER — Ambulatory Visit (INDEPENDENT_AMBULATORY_CARE_PROVIDER_SITE_OTHER): Payer: Medicare HMO

## 2021-04-17 VITALS — BP 146/62 | HR 72 | Ht 68.0 in | Wt 151.0 lb

## 2021-04-17 DIAGNOSIS — R002 Palpitations: Secondary | ICD-10-CM

## 2021-04-17 DIAGNOSIS — E782 Mixed hyperlipidemia: Secondary | ICD-10-CM

## 2021-04-17 DIAGNOSIS — R011 Cardiac murmur, unspecified: Secondary | ICD-10-CM | POA: Diagnosis not present

## 2021-04-17 DIAGNOSIS — E119 Type 2 diabetes mellitus without complications: Secondary | ICD-10-CM | POA: Diagnosis not present

## 2021-04-17 NOTE — Patient Instructions (Addendum)
Medication Instructions:  Your physician recommends that you continue on your current medications as directed. Please refer to the Current Medication list given to you today.  *If you need a refill on your cardiac medications before your next appointment, please call your pharmacy*   Lab Work: None If you have labs (blood work) drawn today and your tests are completely normal, you will receive your results only by: Marland Kitchen MyChart Message (if you have MyChart) OR . A paper copy in the mail If you have any lab test that is abnormal or we need to change your treatment, we will call you to review the results.   Testing/Procedures: Your physician has requested that you have an echocardiogram. Echocardiography is a painless test that uses sound waves to create images of your heart. It provides your doctor with information about the size and shape of your heart and how well your heart's chambers and valves are working. This procedure takes approximately one hour. There are no restrictions for this procedure.  A zio monitor was ordered today. It will remain on for 14 days. You will then return monitor and event diary in provided box. It takes 1-2 weeks for report to be downloaded and returned to [place]. We will call you with the results. If monitor falls off or has orange flashing light, please call Zio for further instructions.     Follow-Up: At [facility], you and your health needs are our priority.  As part of our continuing mission to provide you with exceptional heart care, we have created designated Provider Care Teams.  These Care Teams include your primary Cardiologist (physician) and Advanced Practice Providers (APPs -  Physician Assistants and Nurse Practitioners) who all work together to provide you with the care you need, when you need it.  We recommend signing up for the patient portal called "MyChart".  Sign up information is provided on this After Visit Summary.  MyChart is used to connect  with patients for Virtual Visits (Telemedicine).  Patients are able to view lab/test results, encounter notes, upcoming appointments, etc.  Non-urgent messages can be sent to your provider as well.   To learn more about what you can do with MyChart, go to [email or web address].    Your next appointment:   3 month(s)  The format for your next appointment:   In Person  Provider:   [name], DO   Other Instructions  ZIO  WHY IS MY DOCTOR PRESCRIBING ZIO? The Zio system is proven and trusted by physicians to detect and diagnose irregular heart rhythms -- and has been prescribed to hundreds of thousands of patients.  The FDA has cleared the Zio system to monitor for many different kinds of irregular heart rhythms. In a study, physicians were able to reach a diagnosis 90% of the time with the Zio system1.  You can wear the Zio monitor -- a small, discreet, comfortable patch -- during your normal day-to-day activity, including while you sleep, shower, and exercise, while it records every single heartbeat for analysis.  1Barrett, P., et al. Comparison of 24 Hour Holter Monitoring Versus 14 Day Novel Adhesive Patch Electrocardiographic Monitoring. [place], 2014.  ZIO VS. HOLTER MONITORING The Zio monitor can be comfortably worn for up to 14 days. Holter monitors can be worn for 24 to 48 hours, limiting the time to record any irregular heart rhythms you may have. Zio is able to capture data for the 51% of patients who have their first symptom-triggered arrhythmia after  48 hours.1  LIVE WITHOUT RESTRICTIONS The Zio ambulatory cardiac monitor is a small, unobtrusive, and water-resistant patch--you might even forget you're wearing it. The Zio monitor records and stores every beat of your heart, whether you're sleeping, working out, or showering. Remove on: MAY 5th, 2022  Echocardiogram An echocardiogram is a test that uses sound waves (ultrasound) to produce images of  the heart. Images from an echocardiogram can provide important information about:  Heart size and shape.  The size and thickness and movement of your heart's walls.  Heart muscle function and strength.  Heart valve function or if you have stenosis. Stenosis is when the heart valves are too narrow.  If blood is flowing backward through the heart valves (regurgitation).  A tumor or infectious growth around the heart valves.  Areas of heart muscle that are not working well because of poor blood flow or injury from a heart attack.  Aneurysm detection. An aneurysm is a weak or damaged part of an artery wall. The wall bulges out from the normal force of blood pumping through the body. Tell a health care provider about:  Any allergies you have.  All medicines you are taking, including vitamins, herbs, eye drops, creams, and over-the-counter medicines.  Any blood disorders you have.  Any surgeries you have had.  Any medical conditions you have.  Whether you are pregnant or may be pregnant. What are the risks? Generally, this is a safe test. However, problems may occur, including an allergic reaction to dye (contrast) that may be used during the test. What happens before the test? No specific preparation is needed. You may eat and drink normally. What happens during the test?  You will take off your clothes from the waist up and put on a hospital gown.  Electrodes or electrocardiogram (ECG)patches may be placed on your chest. The electrodes or patches are then connected to a device that monitors your heart rate and rhythm.  You will lie down on a table for an ultrasound exam. A gel will be applied to your chest to help sound waves pass through your skin.  A handheld device, called a transducer, will be pressed against your chest and moved over your heart. The transducer produces sound waves that travel to your heart and bounce back (or "echo" back) to the transducer. These sound  waves will be captured in real-time and changed into images of your heart that can be viewed on a video monitor. The images will be recorded on a computer and reviewed by your health care provider.  You may be asked to change positions or hold your breath for a short time. This makes it easier to get different views or better views of your heart.  In some cases, you may receive contrast through an IV in one of your veins. This can improve the quality of the pictures from your heart. The procedure may vary among health care providers and hospitals.   What can I expect after the test? You may return to your normal, everyday life, including diet, activities, and medicines, unless your health care provider tells you not to do that. Follow these instructions at home:  It is up to you to get the results of your test. Ask your health care provider, or the department that is doing the test, when your results will be ready.  Keep all follow-up visits. This is important. Summary  An echocardiogram is a test that uses sound waves (ultrasound) to produce images of the heart.  Images from an echocardiogram can provide important information about the size and shape of your heart, heart muscle function, heart valve function, and other possible heart problems.  You do not need to do anything to prepare before this test. You may eat and drink normally.  After the echocardiogram is completed, you may return to your normal, everyday life, unless your health care provider tells you not to do that. This information is not intended to replace advice given to you by your health care provider. Make sure you discuss any questions you have with your health care provider. Document Revised: 08/06/2020 Document Reviewed: 08/06/2020 Elsevier Patient Education  2021 [employer].

## 2021-04-17 NOTE — Progress Notes (Signed)
Cardiology Office Note:    Date:  04/17/2021   ID:  Keith Jordan, DOB Sep 22, 1941, MRN [id number]  PCP:  [name], MD  Cardiologist:  [name], DO  Electrophysiologist:  None   Referring MD: Lincoln Brigham, PA-C   I have been having a lot of palpitations determined  History of Present Illness:    Keith Jordan is a 80 y.o. 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  The patient tells me the last several weeks he has been experiencing intermittent palpitations.  He described this as intermittent skipped beats and sometimes fast heart rates.  He has not passed out or had any lightheadedness or dizziness.  He denies any chest pain.  Past Medical History:  Diagnosis Date  . Abnormal breath sounds 10/11/2020  . Acute sinusitis 01/29/2021  . Advanced care planning/counseling discussion 09/01/2016  . BPH (benign prostatic hyperplasia) 09/29/2018  . Ear fullness, bilateral 04/03/2021  . Health maintenance examination 09/01/2016  . Hyperlipidemia   . Irregular heart beat 03/24/2021  . Ischemic optic neuropathy 2006   [place] [facility]  . Leukocytosis 01/30/2021   Severe s/p ER eval 01/2021  . Medicare annual wellness visit, subsequent 10/04/2019  . Nausea without vomiting 03/24/2021  . Personal history of colonic adenoma 07/30/2008  . Prediabetes 07/05/2016   A1c 6.5% 03/2014   . Prediabetes 07/05/2016   A1c 6.5% 03/2014   . Primary osteoarthritis of knee    bilateral s/p L knee replacement, receives R knee injections [name], [place])  . Sebaceous cyst 01/30/2020  . Splenic marginal zone b-cell lymphoma ([place]) 02/05/2021    Past Surgical History:  Procedure Laterality Date  . COLONOSCOPY  12/2013   no polyps, no rpt due [name])  . CYST EXCISION  01/2020  . TOTAL KNEE ARTHROPLASTY Left 2005   [name]    Current Medications: Current Meds  Medication Sig  .  acetaminophen (TYLENOL) 500 MG tablet Take 500 mg by mouth every 6 (six) hours as needed for mild pain.  Marland Kitchen allopurinol (ZYLOPRIM) 300 MG tablet TAKE 1 TABLET BY MOUTH EVERY DAY  . Ascorbic Acid (VITAMIN C) 1000 MG tablet Take 500 mg by mouth 3 (three) times a week.  Marland Kitchen aspirin EC 81 MG tablet Take 1 tablet (81 mg total) by mouth 2 (two) times a week.  Marland Kitchen atorvastatin (LIPITOR) 10 MG tablet Take 1 tablet (10 mg total) by mouth daily.  . carboxymethylcellulose (REFRESH PLUS) 0.5 % SOLN Place 1 drop into both eyes 2 (two) times daily as needed (dry eyes).  . Multiple Vitamins-Minerals (PRESERVISION AREDS) CAPS Take 1 capsule by mouth in the morning and at bedtime.  . ondansetron (ZOFRAN) 8 MG tablet Take 1 tablet (8 mg total) by mouth every 8 (eight) hours as needed for nausea or vomiting.  . sodium chloride (OCEAN) 0.65 % SOLN nasal spray Place 1 spray into both nostrils as needed for congestion.  . [DISCONTINUED] predniSONE (DELTASONE) 20 MG tablet Take 2 tablets (40 mg total) by mouth daily.     Allergies:   Patient has no known allergies.   Social History   Socioeconomic History  . Marital status: Married    Spouse name: Not on file  . Number of children: Not on file  . Years of education: Not on file  . Highest education level: Not on file  Occupational History  . Not on file  Tobacco Use  . Smoking status: Never Smoker  . Smokeless tobacco: Never Used  Vaping Use  . Vaping Use: Never used  Substance and Sexual Activity  . Alcohol use: Yes    Comment: occasional  . Drug use: No  . Sexual activity: Not Currently  Other Topics Concern  . Not on file  Social History Narrative   Lives with wife, 2 cats   Occ: retired Personal assistant   Activity: works in yard   Diet: some water, fruits/vegetables daily   Social Determinants of Radio broadcast assistant Strain: [place]   . Difficulty of Paying Living Expenses: Not hard at all  Food Insecurity: No Food Insecurity  . Worried About  Charity fundraiser in the Last Year: Never true  . Ran Out of Food in the Last Year: Never true  Transportation Needs: No Transportation Needs  . Lack of Transportation (Medical): No  . Lack of Transportation (Non-Medical): No  Physical Activity: Inactive  . Days of Exercise per Week: 0 days  . Minutes of Exercise per Session: 0 min  Stress: No Stress Concern Present  . Feeling of Stress : Not at all  Social Connections: Not on file     Family History: The patient's family history includes Arthritis in his father; Blindness (age of onset: 68) in his mother; Lung disease in his father. There is no history of Colon cancer, Pancreatic cancer, Stomach cancer, Esophageal cancer, Rectal cancer, Cancer, Diabetes, CAD, or Stroke.  ROS:   Review of Systems  Constitution: Negative for decreased appetite, fever and weight gain.  HENT: Negative for congestion, ear discharge, hoarse voice and sore throat.   Eyes: Negative for discharge, redness, vision loss in right eye and visual halos.  Cardiovascular: Reports palpitations.  Negative for chest pain, dyspnea on exertion, leg swelling, orthopnea. Respiratory: Negative for cough, hemoptysis, shortness of breath and snoring.   Endocrine: Negative for heat intolerance and polyphagia.  Hematologic/Lymphatic: Negative for bleeding problem. Does not bruise/bleed easily.  Skin: Negative for flushing, nail changes, rash and suspicious lesions.  Musculoskeletal: Negative for arthritis, joint pain, muscle cramps, myalgias, neck pain and stiffness.  Gastrointestinal: Negative for abdominal pain, bowel incontinence, diarrhea and excessive appetite.  Genitourinary: Negative for decreased libido, genital sores and incomplete emptying.  Neurological: Negative for brief paralysis, focal weakness, headaches and loss of balance.  Psychiatric/Behavioral: Negative for altered mental status, depression and suicidal ideas.  Allergic/Immunologic: Negative for HIV  exposure and persistent infections.    EKGs/Labs/Other Studies Reviewed:    The following studies were reviewed today:   EKG:  The ekg ordered today demonstrates sinus rhythm, heart rate 72 bpm with prolonged pr interval  Recent Labs: 01/30/2021: TSH 1.969 04/07/2021: ALT 11; BUN 22; Creatinine 1.13; Hemoglobin 13.0; Platelet Count 150; Potassium 4.6; Sodium 141  Recent Lipid Panel    Component Value Date/Time   CHOL 120 10/03/2020 1115   CHOL 140 04/16/2015 0000   CHOL 140 04/16/2015 0000   TRIG 106.0 10/03/2020 1115   TRIG 94 04/16/2015 0000   TRIG 94 04/16/2015 0000   HDL 27.90 (L) 10/03/2020 1115   CHOLHDL 4 10/03/2020 1115   VLDL 21.2 10/03/2020 1115   LDLCALC 71 10/03/2020 1115   LDLCALC 86 04/16/2015 0000   LDLCALC 86 04/16/2015 0000    Physical Exam:    VS:  BP (!) 146/62 (BP Location: Right Arm)   Pulse 72   Ht 5\' 8"  (1.727 m)   Wt 151 lb (68.5 kg)  SpO2 97%   BMI 22.96 kg/m     Wt Readings from Last 3 Encounters:  04/17/21 151 lb (68.5 kg)  04/07/21 150 lb 14.4 oz (68.4 kg)  04/03/21 152 lb (68.9 kg)     GEN: Well nourished, well developed in no acute distress HEENT: Normal NECK: No JVD; No carotid bruits LYMPHATICS: No lymphadenopathy CARDIAC: S1S2 noted,RRR, 1/6 mid systolic murmurs, rubs, gallops RESPIRATORY:  Clear to auscultation without rales, wheezing or rhonchi  ABDOMEN: Soft, non-tender, non-distended, +bowel sounds, no guarding. EXTREMITIES: No edema, No cyanosis, no clubbing MUSCULOSKELETAL:  No deformity  SKIN: Warm and dry NEUROLOGIC:  Alert and oriented x 3, non-focal PSYCHIATRIC:  Normal affect, good insight  ASSESSMENT:    1. Palpitations   2. Prediabetes   3. Murmur, cardiac   4. Mixed hyperlipidemia    PLAN:     I would like to rule out a cardiovascular etiology of this palpitation, therefore at this time I would like to placed a zio patch for   14 days.   In additon due to his heart murmur on exam a transthoracic  echocardiogram will be ordered to assess LV/RV function and any structural abnormalities.   Once these testing have been performed amd reviewed further reccomendations will be made. For now, I do reccomend that the patient goes to the nearest ED if  symptoms recur.  This is being managed by his primary care doctor.  No adjustments for antidiabetic medications were made today.  Hyperlipidemia - continue with current statin medication.  The patient is in agreement with the above plan. The patient left the office in stable condition.  The patient will follow up in 3 months   Medication Adjustments/Labs and Tests Ordered: Current medicines are reviewed at length with the patient today.  Concerns regarding medicines are outlined above.  Orders Placed This Encounter  Procedures  . LONG TERM MONITOR-LIVE TELEMETRY (3-14 DAYS)  . EKG 12-Lead  . ECHOCARDIOGRAM COMPLETE   No orders of the defined types were placed in this encounter.   Patient Instructions   Medication Instructions:  Your physician recommends that you continue on your current medications as directed. Please refer to the Current Medication list given to you today.  *If you need a refill on your cardiac medications before your next appointment, please call your pharmacy*   Lab Work: None If you have labs (blood work) drawn today and your tests are completely normal, you will receive your results only by: Marland Kitchen MyChart Message (if you have MyChart) OR . A paper copy in the mail If you have any lab test that is abnormal or we need to change your treatment, we will call you to review the results.   Testing/Procedures: Your physician has requested that you have an echocardiogram. Echocardiography is a painless test that uses sound waves to create images of your heart. It provides your doctor with information about the size and shape of your heart and how well your heart's chambers and valves are working. This procedure takes  approximately one hour. There are no restrictions for this procedure.  A zio monitor was ordered today. It will remain on for 14 days. You will then return monitor and event diary in provided box. It takes 1-2 weeks for report to be downloaded and returned to [place]. We will call you with the results. If monitor falls off or has orange flashing light, please call Zio for further instructions.     Follow-Up: At [facility], you and your  health needs are our priority.  As part of our continuing mission to provide you with exceptional heart care, we have created designated Provider Care Teams.  These Care Teams include your primary Cardiologist (physician) and Advanced Practice Providers (APPs -  Physician Assistants and Nurse Practitioners) who all work together to provide you with the care you need, when you need it.  We recommend signing up for the patient portal called "MyChart".  Sign up information is provided on this After Visit Summary.  MyChart is used to connect with patients for Virtual Visits (Telemedicine).  Patients are able to view lab/test results, encounter notes, upcoming appointments, etc.  Non-urgent messages can be sent to your provider as well.   To learn more about what you can do with MyChart, go to [email or web address].    Your next appointment:   3 month(s)  The format for your next appointment:   In Person  Provider:   [name], DO   Other Instructions  ZIO  WHY IS MY DOCTOR PRESCRIBING ZIO? The Zio system is proven and trusted by physicians to detect and diagnose irregular heart rhythms -- and has been prescribed to hundreds of thousands of patients.  The FDA has cleared the Zio system to monitor for many different kinds of irregular heart rhythms. In a study, physicians were able to reach a diagnosis 90% of the time with the Zio system1.  You can wear the Zio monitor -- a small, discreet, comfortable patch -- during your normal day-to-day activity,  including while you sleep, shower, and exercise, while it records every single heartbeat for analysis.  1Barrett, P., et al. Comparison of 24 Hour Holter Monitoring Versus 14 Day Novel Adhesive Patch Electrocardiographic Monitoring. [place], 2014.  ZIO VS. HOLTER MONITORING The Zio monitor can be comfortably worn for up to 14 days. Holter monitors can be worn for 24 to 48 hours, limiting the time to record any irregular heart rhythms you may have. Zio is able to capture data for the 51% of patients who have their first symptom-triggered arrhythmia after 48 hours.1  LIVE WITHOUT RESTRICTIONS The Zio ambulatory cardiac monitor is a small, unobtrusive, and water-resistant patch--you might even forget you're wearing it. The Zio monitor records and stores every beat of your heart, whether you're sleeping, working out, or showering. Remove on: MAY 5th, 2022  Echocardiogram An echocardiogram is a test that uses sound waves (ultrasound) to produce images of the heart. Images from an echocardiogram can provide important information about:  Heart size and shape.  The size and thickness and movement of your heart's walls.  Heart muscle function and strength.  Heart valve function or if you have stenosis. Stenosis is when the heart valves are too narrow.  If blood is flowing backward through the heart valves (regurgitation).  A tumor or infectious growth around the heart valves.  Areas of heart muscle that are not working well because of poor blood flow or injury from a heart attack.  Aneurysm detection. An aneurysm is a weak or damaged part of an artery wall. The wall bulges out from the normal force of blood pumping through the body. Tell a health care provider about:  Any allergies you have.  All medicines you are taking, including vitamins, herbs, eye drops, creams, and over-the-counter medicines.  Any blood disorders you have.  Any surgeries you have had.  Any  medical conditions you have.  Whether you are pregnant or may be pregnant. What are the risks? Generally,  this is a safe test. However, problems may occur, including an allergic reaction to dye (contrast) that may be used during the test. What happens before the test? No specific preparation is needed. You may eat and drink normally. What happens during the test?  You will take off your clothes from the waist up and put on a hospital gown.  Electrodes or electrocardiogram (ECG)patches may be placed on your chest. The electrodes or patches are then connected to a device that monitors your heart rate and rhythm.  You will lie down on a table for an ultrasound exam. A gel will be applied to your chest to help sound waves pass through your skin.  A handheld device, called a transducer, will be pressed against your chest and moved over your heart. The transducer produces sound waves that travel to your heart and bounce back (or "echo" back) to the transducer. These sound waves will be captured in real-time and changed into images of your heart that can be viewed on a video monitor. The images will be recorded on a computer and reviewed by your health care provider.  You may be asked to change positions or hold your breath for a short time. This makes it easier to get different views or better views of your heart.  In some cases, you may receive contrast through an IV in one of your veins. This can improve the quality of the pictures from your heart. The procedure may vary among health care providers and hospitals.   What can I expect after the test? You may return to your normal, everyday life, including diet, activities, and medicines, unless your health care provider tells you not to do that. Follow these instructions at home:  It is up to you to get the results of your test. Ask your health care provider, or the department that is doing the test, when your results will be ready.  Keep all  follow-up visits. This is important. Summary  An echocardiogram is a test that uses sound waves (ultrasound) to produce images of the heart.  Images from an echocardiogram can provide important information about the size and shape of your heart, heart muscle function, heart valve function, and other possible heart problems.  You do not need to do anything to prepare before this test. You may eat and drink normally.  After the echocardiogram is completed, you may return to your normal, everyday life, unless your health care provider tells you not to do that. This information is not intended to replace advice given to you by your health care provider. Make sure you discuss any questions you have with your health care provider. Document Revised: 08/06/2020 Document Reviewed: 08/06/2020 Elsevier Patient Education  2021 [place].          Adopting a Healthy Lifestyle.  Know what a healthy weight is for you (roughly BMI <25) and aim to maintain this   Aim for 7+ servings of fruits and vegetables daily   65-80+ fluid ounces of water or unsweet tea for healthy kidneys   Limit to max 1 drink of alcohol per day; avoid smoking/tobacco   Limit animal fats in diet for cholesterol and heart health - choose grass fed whenever available   Avoid highly processed foods, and foods high in saturated/trans fats   Aim for low stress - take time to unwind and care for your mental health   Aim for 150 min of moderate intensity exercise weekly for heart health, and weights  twice weekly for bone health   Aim for 7-9 hours of sleep daily   When it comes to diets, agreement about the perfect plan isnt easy to find, even among the experts. Experts at the [place] developed an idea known as the Healthy Eating Plate. Just imagine a plate divided into logical, healthy portions.   The emphasis is on diet quality:   Load up on vegetables and fruits - one-half of your plate:  Aim for color and variety, and remember that potatoes dont count.   Go for whole grains - one-quarter of your plate: Whole wheat, barley, wheat berries, quinoa, oats, brown rice, and foods made with them. If you want pasta, go with whole wheat pasta.   Protein power - one-quarter of your plate: Fish, chicken, beans, and nuts are all healthy, versatile protein sources. Limit red meat.   The diet, however, does go beyond the plate, offering a few other suggestions.   Use healthy plant oils, such as olive, canola, soy, corn, sunflower and peanut. Check the labels, and avoid partially hydrogenated oil, which have unhealthy trans fats.   If youre thirsty, drink water. Coffee and tea are good in moderation, but skip sugary drinks and limit milk and dairy products to one or two daily servings.   The type of carbohydrate in the diet is more important than the amount. Some sources of carbohydrates, such as vegetables, fruits, whole grains, and beans-are healthier than others.   Finally, stay active  Signed, [name], DO  04/17/2021 5:41 PM    [facility]

## 2021-04-18 ENCOUNTER — Telehealth: Payer: Self-pay

## 2021-04-18 DIAGNOSIS — R002 Palpitations: Secondary | ICD-10-CM | POA: Diagnosis not present

## 2021-04-18 MED ORDER — ELIQUIS 5 MG PO TABS: 5.0000 mg | ORAL_TABLET | Freq: Two times a day (BID) | ORAL | 3 refills | Status: DC

## 2021-04-18 NOTE — Telephone Encounter (Signed)
Spoke to the patient just now. We received a faxed zio report stating that he is in atrial fibrillation. Dr. Bettina Gavia reviewed these strips and wanted to start the patient on Eliquis 5 mg twice daily. The patient is agreeable to this and verbalizes understanding.

## 2021-04-19 ENCOUNTER — Telehealth: Payer: Self-pay | Admitting: Medical

## 2021-04-19 NOTE — Telephone Encounter (Signed)
Pt recently found to have afib on Zio monitor and started on Eliquis on 4/22. Patient took 1 pill yesterday evening and he started on having head pressure, ear pressure. Also had left arm and shoulder had pressure. Back was sensitive. No activities that could have caused this. No trouble breathing or swallowing. No skin reaction. No fever, chills, vomiting, chest pain. No swelling noted.   Today shoulder pain and back pain resolved. Still has some head pressure. Has had sinus issues before, and head/ear pressure is similar. He was prescribed prednisone by PCP, but never took it.   Low suspicion symptoms related to new medication. I recommended he continue to take Eliquis and explained why it is important, Recommended Tylenol/allergy medications for HEENT symptoms and to call PCP on Monday if they do not resolve. Will let Dr. Harriet Masson know.  Keith [facility]le Kathlen Mody, PA-C

## 2021-04-21 MED ORDER — DILTIAZEM HCL ER COATED BEADS 120 MG PO CP24: 120.0000 mg | ORAL_CAPSULE | Freq: Every day | ORAL | 12 refills | Status: DC

## 2021-04-21 NOTE — Telephone Encounter (Signed)
Please have patient start Cardizem 120mg  daily as well.

## 2021-04-21 NOTE — Telephone Encounter (Signed)
Spoke with pt and advised him of the new medication. Pt verbalized understanding and RX sent to pharmacy. Pt had no additional questions.

## 2021-04-21 NOTE — Addendum Note (Signed)
Addended by: Truddie Hidden on: 04/21/2021 10:54 AM   Modules accepted: Orders

## 2021-04-24 ENCOUNTER — Telehealth: Payer: Self-pay | Admitting: Hematology and Oncology

## 2021-04-24 NOTE — Telephone Encounter (Signed)
R/s 5/2 appt per provider emergency. Called and spoke with patient. Confirmed new date and time

## 2021-04-28 ENCOUNTER — Ambulatory Visit: Payer: Medicare HMO | Admitting: Hematology and Oncology

## 2021-04-28 ENCOUNTER — Other Ambulatory Visit: Payer: Medicare HMO

## 2021-05-01 ENCOUNTER — Other Ambulatory Visit: Payer: Self-pay

## 2021-05-01 ENCOUNTER — Inpatient Hospital Stay: Payer: Medicare HMO | Admitting: Hematology and Oncology

## 2021-05-01 ENCOUNTER — Inpatient Hospital Stay: Payer: Medicare HMO | Attending: Hematology and Oncology

## 2021-05-01 VITALS — BP 138/62 | HR 76 | Temp 97.1°F | Resp 19 | Ht 68.0 in | Wt 151.2 lb

## 2021-05-01 DIAGNOSIS — D72820 Lymphocytosis (symptomatic): Secondary | ICD-10-CM | POA: Insufficient documentation

## 2021-05-01 DIAGNOSIS — C8307 Small cell B-cell lymphoma, spleen: Secondary | ICD-10-CM | POA: Diagnosis not present

## 2021-05-01 DIAGNOSIS — E785 Hyperlipidemia, unspecified: Secondary | ICD-10-CM | POA: Diagnosis not present

## 2021-05-01 DIAGNOSIS — R110 Nausea: Secondary | ICD-10-CM | POA: Diagnosis not present

## 2021-05-01 DIAGNOSIS — E119 Type 2 diabetes mellitus without complications: Secondary | ICD-10-CM | POA: Insufficient documentation

## 2021-05-01 DIAGNOSIS — R002 Palpitations: Secondary | ICD-10-CM | POA: Diagnosis not present

## 2021-05-01 DIAGNOSIS — Z7901 Long term (current) use of anticoagulants: Secondary | ICD-10-CM | POA: Diagnosis not present

## 2021-05-01 DIAGNOSIS — I499 Cardiac arrhythmia, unspecified: Secondary | ICD-10-CM | POA: Diagnosis not present

## 2021-05-01 DIAGNOSIS — D72829 Elevated white blood cell count, unspecified: Secondary | ICD-10-CM | POA: Insufficient documentation

## 2021-05-01 LAB — CBC WITH DIFFERENTIAL (CANCER CENTER ONLY)
Abs Immature Granulocytes: 0.57 10*3/uL — ABNORMAL HIGH (ref 0.00–0.07)
Basophils Absolute: 0.1 10*3/uL (ref 0.0–0.1)
Basophils Relative: 0 %
Eosinophils Absolute: 0.7 10*3/uL — ABNORMAL HIGH (ref 0.0–0.5)
Eosinophils Relative: 1 %
HCT: 41.1 % (ref 39.0–52.0)
Hemoglobin: 13.5 g/dL (ref 13.0–17.0)
Immature Granulocytes: 1 %
Lymphocytes Relative: 89 %
Lymphs Abs: 79.9 10*3/uL — ABNORMAL HIGH (ref 0.7–4.0)
MCH: 31.8 pg (ref 26.0–34.0)
MCHC: 32.8 g/dL (ref 30.0–36.0)
MCV: 96.9 fL (ref 80.0–100.0)
Monocytes Absolute: 2.7 10*3/uL — ABNORMAL HIGH (ref 0.1–1.0)
Monocytes Relative: 3 %
Neutro Abs: 5.2 10*3/uL (ref 1.7–7.7)
Neutrophils Relative %: 6 %
Platelet Count: 136 10*3/uL — ABNORMAL LOW (ref 150–400)
RBC: 4.24 MIL/uL (ref 4.22–5.81)
RDW: 14.7 % (ref 11.5–15.5)
WBC Count: 89.1 10*3/uL (ref 4.0–10.5)
nRBC: 0 % (ref 0.0–0.2)

## 2021-05-01 LAB — LACTATE DEHYDROGENASE: LDH: 167 U/L (ref 98–192)

## 2021-05-01 LAB — CMP (CANCER CENTER ONLY)
ALT: 10 U/L (ref 0–44)
AST: 29 U/L (ref 15–41)
Albumin: 4 g/dL (ref 3.5–5.0)
Alkaline Phosphatase: 80 U/L (ref 38–126)
Anion gap: 9 (ref 5–15)
BUN: 21 mg/dL (ref 8–23)
CO2: 29 mmol/L (ref 22–32)
Calcium: 9.4 mg/dL (ref 8.9–10.3)
Chloride: 102 mmol/L (ref 98–111)
Creatinine: 1.2 mg/dL (ref 0.61–1.24)
GFR, Estimated: >60 mL/min (ref >60)
Glucose, Bld: 142 mg/dL — ABNORMAL HIGH (ref 70–99)
Potassium: 4.8 mmol/L (ref 3.5–5.1)
Sodium: 140 mmol/L (ref 135–145)
Total Bilirubin: 0.6 mg/dL (ref 0.3–1.2)
Total Protein: 6.7 g/dL (ref 6.5–8.1)

## 2021-05-01 MED ORDER — CYCLOBENZAPRINE HCL 5 MG PO TABS: 5.0000 mg | ORAL_TABLET | Freq: Every day | ORAL | 0 refills | Status: DC

## 2021-05-01 NOTE — Progress Notes (Signed)
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 Referred to Emergency Department. 02/05/2021: establish care with Dr. Lorenso Courier  02/24/2021: Week 1 of Monotherapy Rituximab 03/03/2021:  Week 2 of Monotherapy Rituximab 03/10/2021:  Week 3 of Monotherapy Rituximab. WBC 109.8 03/17/2021: Week 4 of Monotherapy Rituximab. WBC 94.8 03/24/2021: WBC 59.8 05/01/2021: WBC 89.1  Interval History:  Keith Jordan 80 y.o. male with medical history significant for splenic marginal zone lymphoma who presents for a follow up visit. The patient's last visit was on 04/07/2021. In the interim since the last visit he has been in stable health.   On exam today Mr. Hawker notes he has been having pain on his left side breast described as a light pressure that is on and off.  He reports that it is now coming back more often since he is completed his treatments.  He reports that it is relieved by standing up and that sitting down or laying tends to make the pain worse.  He describes it as crampy in nature and occurs periodically.  He otherwise has not had any issues with fevers, chills, sweats, nausea, vomiting or diarrhea.  He notes that his appetite is good and his weight is currently stable at 151 pounds.  He has no other complaints.   A full 10 point ROS is listed below.  MEDICAL HISTORY:  Past Medical History:  Diagnosis Date  . Abnormal breath sounds 10/11/2020  . Acute sinusitis 01/29/2021  . Advanced care planning/counseling discussion 09/01/2016  . BPH (benign prostatic hyperplasia) 09/29/2018  . Ear fullness, bilateral 04/03/2021  . Health maintenance  examination 09/01/2016  . Hyperlipidemia   . Irregular heart beat 03/24/2021  . Ischemic optic neuropathy 2006   [place] [facility]  . Leukocytosis 01/30/2021   Severe s/p ER eval 01/2021  . Medicare annual wellness visit, subsequent 10/04/2019  . Nausea without vomiting 03/24/2021  . Personal history of colonic adenoma 07/30/2008  . Prediabetes 07/05/2016   A1c 6.5% 03/2014   . Prediabetes 07/05/2016   A1c 6.5% 03/2014   . Primary osteoarthritis of knee    bilateral s/p L knee replacement, receives R knee injections [name], [place])  . Sebaceous cyst 01/30/2020  . Splenic marginal zone b-cell lymphoma ([place]) 02/05/2021    SURGICAL HISTORY: Past Surgical History:  Procedure Laterality Date  . COLONOSCOPY  12/2013   no polyps, no rpt due [name])  . CYST EXCISION  01/2020  . TOTAL KNEE ARTHROPLASTY Left 2005   [name]    SOCIAL HISTORY: Social History   Socioeconomic History  . Marital status: Married    Spouse name: Not on file  . Number of children: Not on file  . Years of education: Not on file  . Highest education level: Not on file  Occupational History  . Not on file  Tobacco Use  . Smoking status: Never Smoker  . Smokeless tobacco: Never Used  Vaping Use  . Vaping Use: Never used  Substance and Sexual Activity  . Alcohol use: Yes    Comment: occasional  . Drug use: No  . Sexual activity: Not Currently  Other Topics Concern  . Not on file  Social History Narrative   Lives  with wife, 2 cats   Occ: retired Personal assistant   Activity: works in yard   Diet: some water, fruits/vegetables daily   Social Determinants of Radio broadcast assistant Strain: [place]   . Difficulty of Paying Living Expenses: Not hard at all  Food Insecurity: No Food Insecurity  . Worried About Charity fundraiser in the Last Year: Never true  . Ran Out of Food in the Last Year: Never true  Transportation Needs: No Transportation Needs  . Lack of Transportation (Medical): No  . Lack of  Transportation (Non-Medical): No  Physical Activity: Inactive  . Days of Exercise per Week: 0 days  . Minutes of Exercise per Session: 0 min  Stress: No Stress Concern Present  . Feeling of Stress : Not at all  Social Connections: Not on file  Intimate Partner Violence: Not At Risk  . Fear of Current or Ex-Partner: No  . Emotionally Abused: No  . Physically Abused: No  . Sexually Abused: No    FAMILY HISTORY: Family History  Problem Relation Age of Onset  . Blindness Mother 6       ?stroke in eyes  . Lung disease Father        Ecologist  . Arthritis Father   . Colon cancer Neg Hx   . Pancreatic cancer Neg Hx   . Stomach cancer Neg Hx   . Esophageal cancer Neg Hx   . Rectal cancer Neg Hx   . Cancer Neg Hx   . Diabetes Neg Hx   . CAD Neg Hx   . Stroke Neg Hx     ALLERGIES:  has No Known Allergies.  MEDICATIONS:  Current Outpatient Medications  Medication Sig Dispense Refill  . acetaminophen (TYLENOL) 500 MG tablet Take 500 mg by mouth every 6 (six) hours as needed for mild pain.    Marland Kitchen allopurinol (ZYLOPRIM) 300 MG tablet TAKE 1 TABLET BY MOUTH EVERY DAY 30 tablet 1  . apixaban (ELIQUIS) 5 MG TABS tablet Take 1 tablet (5 mg total) by mouth 2 (two) times daily. 180 tablet 3  . Ascorbic Acid (VITAMIN C) 1000 MG tablet Take 500 mg by mouth 3 (three) times a week.    Marland Kitchen aspirin EC 81 MG tablet Take 1 tablet (81 mg total) by mouth 2 (two) times a week.    Marland Kitchen atorvastatin (LIPITOR) 10 MG tablet Take 1 tablet (10 mg total) by mouth daily. 90 tablet 3  . carboxymethylcellulose (REFRESH PLUS) 0.5 % SOLN Place 1 drop into both eyes 2 (two) times daily as needed (dry eyes).    Marland Kitchen diltiazem (CARDIZEM CD) 120 MG 24 hr capsule Take 1 capsule (120 mg total) by mouth daily. 30 capsule 12  . Multiple Vitamins-Minerals (PRESERVISION AREDS) CAPS Take 1 capsule by mouth in the morning and at bedtime. 180 capsule 3  . ondansetron (ZOFRAN) 8 MG tablet Take 1 tablet (8 mg total) by mouth every 8  (eight) hours as needed for nausea or vomiting. 30 tablet 0  . sodium chloride (OCEAN) 0.65 % SOLN nasal spray Place 1 spray into both nostrils as needed for congestion.     No current facility-administered medications for this visit.    REVIEW OF SYSTEMS:   Constitutional: ( - ) fevers, ( - )  chills , ( - ) night sweats Eyes: ( - ) blurriness of vision, ( - ) double vision, ( - ) watery eyes Ears, nose, mouth, throat, and face: ( - )  mucositis, ( - ) sore throat Respiratory: ( - ) cough, ( - ) dyspnea, ( - ) wheezes Cardiovascular: ( - ) palpitation, ( - ) chest discomfort, ( - ) lower extremity swelling Gastrointestinal:  ( + ) nausea, ( - ) heartburn, ( - ) change in bowel habits Skin: ( - ) abnormal skin rashes Lymphatics: ( - ) new lymphadenopathy, ( - ) easy bruising Neurological: ( - ) numbness, ( + ) tingling, ( - ) new weaknesses Behavioral/Psych: ( - ) mood change, ( - ) new changes  All other systems were reviewed with the patient and are negative.  PHYSICAL EXAMINATION: ECOG PERFORMANCE STATUS: 1 - Symptomatic but completely ambulatory  Vitals:   05/01/21 1308  BP: 138/62  Pulse: 76  Resp: 19  Temp: (!) 97.1 F (36.2 C)  SpO2: 98%   Filed Weights   05/01/21 1308  Weight: 151 lb 3.2 oz (68.6 kg)    GENERAL: well appearing elderly Caucasian male alert, no distress and comfortable SKIN: skin color, texture, turgor are normal, no rashes or significant lesions EYES: conjunctiva are pink and non-injected, sclera clear LUNGS: clear to auscultation and percussion with normal breathing effort HEART: regular rate & rhythm and no murmurs and no lower extremity edema Musculoskeletal: no cyanosis of digits and no clubbing  PSYCH: alert & oriented x 3, fluent speech NEURO: no focal motor/sensory deficits  LABORATORY DATA:  I have reviewed the data as listed CBC Latest Ref Rng & Units 05/01/2021 04/07/2021 03/24/2021  WBC 4.0 - 10.5 K/uL 89.1(HH) 99.3(HH) 59.8(HH)   Hemoglobin 13.0 - 17.0 g/dL 13.5 13.0 14.6  Hematocrit 39.0 - 52.0 % 41.1 40.3 44.9  Platelets 150 - 400 K/uL 136(L) 150 136(L)    CMP Latest Ref Rng & Units 05/01/2021 04/07/2021 03/24/2021  Glucose 70 - 99 mg/dL 142(H) 163(H) 197(H)  BUN 8 - 23 mg/dL 21 22 28(H)  Creatinine 0.61 - 1.24 mg/dL 1.20 1.13 1.29(H)  Sodium 135 - 145 mmol/L 140 141 140  Potassium 3.5 - 5.1 mmol/L 4.8 4.6 4.2  Chloride 98 - 111 mmol/L 102 103 99  CO2 22 - 32 mmol/L $RemoveB'29 26 30  'sLwNhANJ$ Calcium 8.9 - 10.3 mg/dL 9.4 9.0 9.3  Total Protein 6.5 - 8.1 g/dL 6.7 6.6 6.8  Total Bilirubin 0.3 - 1.2 mg/dL 0.6 0.7 0.9  Alkaline Phos 38 - 126 U/L 80 77 91  AST 15 - 41 U/L $Remo'29 21 23  'rhHiv$ ALT 0 - 44 U/L $Remo'10 11 16   'pBWgv$ RADIOGRAPHIC STUDIES: No results found.  ASSESSMENT & PLAN Keith Jordan 80 y.o. male with medical history significant for splenic marginal zone lymphoma. CT CAP from 02/13/21  showed only splenic involvement and a bone marrow biopsy from 02/21/21 confirmed the diagnosis.  The patient has only leukocytosis and splenomegaly.  The recommendation is monotherapy rituximab 375 mg per metered squared q. 7 days x 4 doses.   Patient returns today after completion of 4 weeks of rituxumab. Patient is doing well without any significant limitations. Labs from today show stably elevated leukocytosis but the platelet count has normalized. Plan for patient to return to the clinic in 4 weeks.  #Splenic Marginal Zone Lymphoma --findings are consistent with a splenic marginal zone lymphoma. --patient completed bone marrow biopsy and a CT chest abdomen pelvis. Disease appear limited to peripheral blood and spleen.  --Treatment of choice with confirmed splenic marginal zone lymphoma is rituximab 375 mg/m q. 7 days x 4 doses --Due to persistent lymphocytosis, recommend  to continue with allopurinol to prevent TLS. --if there is concern for recurrent/residual lymphoma with an indication for treatment can consider treatment with Ibrutinib/Zanbrutinib  vs Bendamustine +ritux/obinutuzumab. Would need caution with ibrutinib given patient's cardiac history and current anticoagulation therapy.  --RTC to see Dr. Lorenso Courier in 4 weeks with labs   #Nausea --Take Zofran 8 mg q 8 hours PRN.   #Irregular heart beat --Physical exam finding was consistent with regular rhythm and rate.  --Most recent EKG from 01/31/21 showed sinus rhythm.  --Made referral to cardiology for further workup which has been scheduled for 04/17/21.   No orders of the defined types were placed in this encounter.   All questions were answered. The patient knows to call the clinic with any problems, questions or concerns.  A total of more than 30 minutes were spent on this encounter and over half of that time was spent on counseling and coordination of care as outlined above.   Ledell Peoples, MD Department of Hematology/Oncology [place] [facility] Phone: [phone] Pager: [phone] Email: [email or web address]

## 2021-05-02 ENCOUNTER — Telehealth: Payer: Self-pay | Admitting: Hematology and Oncology

## 2021-05-02 NOTE — Telephone Encounter (Signed)
Scheduled per los. Called and spoke with patient. Confirmed appt 

## 2021-05-08 ENCOUNTER — Telehealth: Payer: Self-pay

## 2021-05-08 NOTE — Telephone Encounter (Signed)
Tried calling patient to set up an appointment next week in [facility] to be seen by Dr. Harriet Masson. Left message for patient to return our call.

## 2021-05-08 NOTE — Telephone Encounter (Signed)
Tried calling patient to set up an appointment next week in [facility] to be seen by Dr. Harriet Masson. No answer, no voicemail set up to leave a message.

## 2021-05-14 ENCOUNTER — Encounter: Payer: Self-pay | Admitting: Cardiology

## 2021-05-14 ENCOUNTER — Ambulatory Visit: Payer: Medicare HMO | Admitting: Cardiology

## 2021-05-14 ENCOUNTER — Other Ambulatory Visit: Payer: Self-pay

## 2021-05-14 VITALS — BP 144/60 | HR 76 | Ht 69.0 in | Wt 151.1 lb

## 2021-05-14 DIAGNOSIS — I4892 Unspecified atrial flutter: Secondary | ICD-10-CM | POA: Diagnosis not present

## 2021-05-14 DIAGNOSIS — E782 Mixed hyperlipidemia: Secondary | ICD-10-CM | POA: Diagnosis not present

## 2021-05-14 DIAGNOSIS — R7303 Prediabetes: Secondary | ICD-10-CM | POA: Diagnosis not present

## 2021-05-14 DIAGNOSIS — I10 Essential (primary) hypertension: Secondary | ICD-10-CM

## 2021-05-14 DIAGNOSIS — I483 Typical atrial flutter: Secondary | ICD-10-CM | POA: Diagnosis not present

## 2021-05-14 MED ORDER — DILTIAZEM HCL ER COATED BEADS 180 MG PO CP24: 180.0000 mg | ORAL_CAPSULE | Freq: Every day | ORAL | 3 refills | Status: DC

## 2021-05-14 NOTE — Progress Notes (Signed)
Cardiology Office Note:    Date:  05/14/2021   ID:  Keith Jordan, DOB Feb 18, 1941, MRN [id number]  PCP:  Keith Bush, MD  Cardiologist:  Keith Salines, DO  Electrophysiologist:  None   Referring MD: Keith Bush, MD   I am still experiencing some palpitations - but it has improved since the medicine  History of Present Illness:    Keith Jordan is a 80 y.o. 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   I saw the patient on 04/17/2021 and at that time he reported intermittent palpitations, therefore a zio live monitor was placed on the patient. Two days of wearing the monitor, atrial flutter was noted and we started the patient on anticoagulation with Eliquis ( CHADS2 VASc score of 2). He was also started on Cardizem 120 mg daily.  He is here today for a follow up visit. He states he has had some improvement in his palpitations but no resolution.   Past Medical History:  Diagnosis Date  . Abnormal breath sounds 10/11/2020  . Acute sinusitis 01/29/2021  . Advanced care planning/counseling discussion 09/01/2016  . BPH (benign prostatic hyperplasia) 09/29/2018  . Ear fullness, bilateral 04/03/2021  . Health maintenance examination 09/01/2016  . Hyperlipidemia   . Irregular heart beat 03/24/2021  . Ischemic optic neuropathy 2006   [place] [facility]  . Leukocytosis 01/30/2021   Severe s/p ER eval 01/2021  . Medicare annual wellness visit, subsequent 10/04/2019  . Nausea without vomiting 03/24/2021  . Personal history of colonic adenoma 07/30/2008  . Prediabetes 07/05/2016   A1c 6.5% 03/2014   . Prediabetes 07/05/2016   A1c 6.5% 03/2014   . Primary osteoarthritis of knee    bilateral s/p L knee replacement, receives R knee injections [name], [place])  . Sebaceous cyst 01/30/2020  . Splenic marginal zone b-cell lymphoma ([place]) 02/05/2021    Past Surgical History:   Procedure Laterality Date  . COLONOSCOPY  12/2013   no polyps, no rpt due [name])  . CYST EXCISION  01/2020  . TOTAL KNEE ARTHROPLASTY Left 2005   [name]    Current Medications: Current Meds  Medication Sig  . acetaminophen (TYLENOL) 500 MG tablet Take 500 mg by mouth every 6 (six) hours as needed for mild pain.  Marland Kitchen allopurinol (ZYLOPRIM) 300 MG tablet TAKE 1 TABLET BY MOUTH EVERY DAY  . apixaban (ELIQUIS) 5 MG TABS tablet Take 1 tablet (5 mg total) by mouth 2 (two) times daily.  . Ascorbic Acid (VITAMIN C) 1000 MG tablet Take 500 mg by mouth 3 (three) times a week.  Marland Kitchen atorvastatin (LIPITOR) 10 MG tablet Take 1 tablet (10 mg total) by mouth daily.  . carboxymethylcellulose (REFRESH PLUS) 0.5 % SOLN Place 1 drop into both eyes 2 (two) times daily as needed (dry eyes).  . cyclobenzaprine (FLEXERIL) 5 MG tablet Take 1 tablet (5 mg total) by mouth at bedtime.  Marland Kitchen diltiazem (CARDIZEM CD) 180 MG 24 hr capsule Take 1 capsule (180 mg total) by mouth daily.  . Multiple Vitamins-Minerals (PRESERVISION AREDS) CAPS Take 1 capsule by mouth in the morning and at bedtime.  . ondansetron (ZOFRAN) 8 MG tablet Take 1 tablet (8 mg total) by mouth every 8 (eight) hours as needed for nausea or vomiting.  . sodium chloride (OCEAN) 0.65 % SOLN nasal spray Place 1 spray into both nostrils as needed for congestion.  . [DISCONTINUED] diltiazem (  CARDIZEM CD) 120 MG 24 hr capsule Take 1 capsule (120 mg total) by mouth daily.     Allergies:   Patient has no known allergies.   Social History   Socioeconomic History  . Marital status: Married    Spouse name: Not on file  . Number of children: Not on file  . Years of education: Not on file  . Highest education level: Not on file  Occupational History  . Not on file  Tobacco Use  . Smoking status: Never Smoker  . Smokeless tobacco: Never Used  Vaping Use  . Vaping Use: Never used  Substance and Sexual Activity  . Alcohol use: Yes    Comment: occasional   . Drug use: No  . Sexual activity: Not Currently  Other Topics Concern  . Not on file  Social History Narrative   Lives with wife, 2 cats   Occ: retired Personal assistant   Activity: works in yard   Diet: some water, fruits/vegetables daily   Social Determinants of Radio broadcast assistant Strain: [place]   . Difficulty of Paying Living Expenses: Not hard at all  Food Insecurity: No Food Insecurity  . Worried About Charity fundraiser in the Last Year: Never true  . Ran Out of Food in the Last Year: Never true  Transportation Needs: No Transportation Needs  . Lack of Transportation (Medical): No  . Lack of Transportation (Non-Medical): No  Physical Activity: Inactive  . Days of Exercise per Week: 0 days  . Minutes of Exercise per Session: 0 min  Stress: No Stress Concern Present  . Feeling of Stress : Not at all  Social Connections: Not on file     Family History: The patient's family history includes Arthritis in his father; Blindness (age of onset: 52) in his mother; Lung disease in his father. There is no history of Colon cancer, Pancreatic cancer, Stomach cancer, Esophageal cancer, Rectal cancer, Cancer, Diabetes, CAD, or Stroke.  ROS:   Review of Systems  Constitution: Negative for decreased appetite, fever and weight gain.  HENT: Negative for congestion, ear discharge, hoarse voice and sore throat.   Eyes: Negative for discharge, redness, vision loss in right eye and visual halos.  Cardiovascular: Negative for chest pain, dyspnea on exertion, leg swelling, orthopnea and palpitations.  Respiratory: Negative for cough, hemoptysis, shortness of breath and snoring.   Endocrine: Negative for heat intolerance and polyphagia.  Hematologic/Lymphatic: Negative for bleeding problem. Does not bruise/bleed easily.  Skin: Negative for flushing, nail changes, rash and suspicious lesions.  Musculoskeletal: Negative for arthritis, joint pain, muscle cramps, myalgias, neck pain and  stiffness.  Gastrointestinal: Negative for abdominal pain, bowel incontinence, diarrhea and excessive appetite.  Genitourinary: Negative for decreased libido, genital sores and incomplete emptying.  Neurological: Negative for brief paralysis, focal weakness, headaches and loss of balance.  Psychiatric/Behavioral: Negative for altered mental status, depression and suicidal ideas.  Allergic/Immunologic: Negative for HIV exposure and persistent infections.    EKGs/Labs/Other Studies Reviewed:    The following studies were reviewed today:   EKG:  None today  Zio monitor The patient wore the monitor for 13 days, 21 hours starting April 17, 2021.   Indication: Palpitations  The minimum heart rate was 46 bpm, maximum heart rate was 149 bpm, and average heart rate was 68 bpm. Predominant underlying rhythm was Sinus Rhythm.  3 Supraventricular Tachycardia runs occurred, the run with the fastest interval lasting 4 beats with a max rate of 126 bpm, the  longest lasting 7 beats with an avg rate of 103 bpm.    Atrial Flutter occurred (3% burden), ranging from 46-149 bpm (avg of 82 bpm), the longest lasting 1 hour 41 mins with an avg rate of 92 bpm.  Premature atrial complexes were occasional (1.0%, 13493). Premature Ventricular complexes were rare less than 1%. No ventricular tachycardia, no pauses present.  Symptoms are associated with atrial flutter, sinus rhythm and premature atrial complexes.     Conclusion: This study is remarkable for the following:                       1.  Symptomatic atrial flutter                       2.  Occasional premature atrial complexes.   Recent Labs: 01/30/2021: TSH 1.969 05/01/2021: ALT 10; BUN 21; Creatinine 1.20; Hemoglobin 13.5; Platelet Count 136; Potassium 4.8; Sodium 140  Recent Lipid Panel    Component Value Date/Time   CHOL 120 10/03/2020 1115   CHOL 140 04/16/2015 0000   CHOL 140 04/16/2015 0000   TRIG 106.0 10/03/2020 1115   TRIG 94  04/16/2015 0000   TRIG 94 04/16/2015 0000   HDL 27.90 (L) 10/03/2020 1115   CHOLHDL 4 10/03/2020 1115   VLDL 21.2 10/03/2020 1115   LDLCALC 71 10/03/2020 1115   LDLCALC 86 04/16/2015 0000   LDLCALC 86 04/16/2015 0000    Physical Exam:    VS:  BP (!) 144/60   Pulse 76   Ht 5\' 9"  (1.753 m)   Wt 151 lb 1.3 oz (68.5 kg)   SpO2 97%   BMI 22.31 kg/m     Wt Readings from Last 3 Encounters:  05/14/21 151 lb 1.3 oz (68.5 kg)  05/01/21 151 lb 3.2 oz (68.6 kg)  04/17/21 151 lb (68.5 kg)     GEN: Well nourished, well developed in no acute distress HEENT: Normal NECK: No JVD; No carotid bruits LYMPHATICS: No lymphadenopathy CARDIAC: S1S2 noted,RRR, no murmurs, rubs, gallops RESPIRATORY:  Clear to auscultation without rales, wheezing or rhonchi  ABDOMEN: Soft, non-tender, non-distended, +bowel sounds, no guarding. EXTREMITIES: No edema, No cyanosis, no clubbing MUSCULOSKELETAL:  No deformity  SKIN: Warm and dry NEUROLOGIC:  Alert and oriented x 3, non-focal PSYCHIATRIC:  Normal affect, good insight  ASSESSMENT:    1. Typical atrial flutter ([place])   2. Hypertension, unspecified type   3. Prediabetes   4. Mixed hyperlipidemia    PLAN:     His monitor did show evidence of paroxysmal atrial flutter, the patient still is having some palpitations secondary to increased diltiazem to 180 mg daily.  In the meantime he will remain on his Eliquis 5 mg daily.  I also will set the patient up to be evaluated by EP for his atrial flutter discussed treatment options for rhythm control.  Will get CBC in the setting of his recent use of Eliquis.  In terms of his prediabetes-this is being managed by his primary care doctor.  No adjustments for antidiabetic medications were made today.  Hyperlipidemia - continue with current statin medication.  The patient is in agreement with the above plan. The patient left the office in stable condition.  The patient will follow up in   Medication  Adjustments/Labs and Tests Ordered: Current medicines are reviewed at length with the patient today.  Concerns regarding medicines are outlined above.  Orders Placed This Encounter  Procedures  .  CBC with Differential/Platelet  . Ambulatory referral to Cardiac Electrophysiology   Meds ordered this encounter  Medications  . diltiazem (CARDIZEM CD) 180 MG 24 hr capsule    Sig: Take 1 capsule (180 mg total) by mouth daily.    Dispense:  90 capsule    Refill:  3    Patient Instructions  Medication Instructions:  Your physician has recommended you make the following change in your medication: INCREASE: Cardizem 180 mg once daily *If you need a refill on your cardiac medications before your next appointment, please call your pharmacy*   Lab Work: Your physician recommends that you return for lab work: TODAY: CBC  If you have labs (blood work) drawn today and your tests are completely normal, you will receive your results only by: Marland Kitchen MyChart Message (if you have MyChart) OR . A paper copy in the mail If you have any lab test that is abnormal or we need to change your treatment, we will call you to review the results.   Testing/Procedures: None   Follow-Up: At [facility], you and your health needs are our priority.  As part of our continuing mission to provide you with exceptional heart care, we have created designated Provider Care Teams.  These Care Teams include your primary Cardiologist (physician) and Advanced Practice Providers (APPs -  Physician Assistants and Nurse Practitioners) who all work together to provide you with the care you need, when you need it.  We recommend signing up for the patient portal called "MyChart".  Sign up information is provided on this After Visit Summary.  MyChart is used to connect with patients for Virtual Visits (Telemedicine).  Patients are able to view lab/test results, encounter notes, upcoming appointments, etc.  Non-urgent messages can be  sent to your provider as well.   To learn more about what you can do with MyChart, go to [email or web address].    Your next appointment:   3 month(s)  The format for your next appointment:   In Person  Provider:   [name], DO   Other Instructions      Adopting a Healthy Lifestyle.  Know what a healthy weight is for you (roughly BMI <25) and aim to maintain this   Aim for 7+ servings of fruits and vegetables daily   65-80+ fluid ounces of water or unsweet tea for healthy kidneys   Limit to max 1 drink of alcohol per day; avoid smoking/tobacco   Limit animal fats in diet for cholesterol and heart health - choose grass fed whenever available   Avoid highly processed foods, and foods high in saturated/trans fats   Aim for low stress - take time to unwind and care for your mental health   Aim for 150 min of moderate intensity exercise weekly for heart health, and weights twice weekly for bone health   Aim for 7-9 hours of sleep daily   When it comes to diets, agreement about the perfect plan isnt easy to find, even among the experts. Experts at the [employer] developed an idea known as the Healthy Eating Plate. Just imagine a plate divided into logical, healthy portions.   The emphasis is on diet quality:   Load up on vegetables and fruits - one-half of your plate: Aim for color and variety, and remember that potatoes dont count.   Go for whole grains - one-quarter of your plate: Whole wheat, barley, wheat berries, quinoa, oats, brown rice, and foods made with them. If  you want pasta, go with whole wheat pasta.   Protein power - one-quarter of your plate: Fish, chicken, beans, and nuts are all healthy, versatile protein sources. Limit red meat.   The diet, however, does go beyond the plate, offering a few other suggestions.   Use healthy plant oils, such as olive, canola, soy, corn, sunflower and peanut. Check the labels, and avoid partially  hydrogenated oil, which have unhealthy trans fats.   If youre thirsty, drink water. Coffee and tea are good in moderation, but skip sugary drinks and limit milk and dairy products to one or two daily servings.   The type of carbohydrate in the diet is more important than the amount. Some sources of carbohydrates, such as vegetables, fruits, whole grains, and beans-are healthier than others.   Finally, stay active  Signed, Keith Salines, DO  05/14/2021 4:03 PM    [facility]

## 2021-05-14 NOTE — Patient Instructions (Signed)
Medication Instructions:  Your physician has recommended you make the following change in your medication: INCREASE: Cardizem 180 mg once daily *If you need a refill on your cardiac medications before your next appointment, please call your pharmacy*   Lab Work: Your physician recommends that you return for lab work: TODAY: CBC  If you have labs (blood work) drawn today and your tests are completely normal, you will receive your results only by: Marland Kitchen MyChart Message (if you have MyChart) OR . A paper copy in the mail If you have any lab test that is abnormal or we need to change your treatment, we will call you to review the results.   Testing/Procedures: None   Follow-Up: At [facility], you and your health needs are our priority.  As part of our continuing mission to provide you with exceptional heart care, we have created designated Provider Care Teams.  These Care Teams include your primary Cardiologist (physician) and Advanced Practice Providers (APPs -  Physician Assistants and Nurse Practitioners) who all work together to provide you with the care you need, when you need it.  We recommend signing up for the patient portal called "MyChart".  Sign up information is provided on this After Visit Summary.  MyChart is used to connect with patients for Virtual Visits (Telemedicine).  Patients are able to view lab/test results, encounter notes, upcoming appointments, etc.  Non-urgent messages can be sent to your provider as well.   To learn more about what you can do with MyChart, go to [email or web address].    Your next appointment:   3 month(s)  The format for your next appointment:   In Person  Provider:   [name], DO   Other Instructions

## 2021-05-15 ENCOUNTER — Telehealth: Payer: Self-pay | Admitting: Cardiology

## 2021-05-15 NOTE — Telephone Encounter (Signed)
Patience from [facility] is calling with critcal lab results for this pt

## 2021-05-15 NOTE — Telephone Encounter (Signed)
When calling back Patience the number listed requests that I "Enter your ID" and I can not get through. However, I pulled up the patients chart and saw a critical result on the patients CBC from yesterday. The patients WBC count is 78.4, this is decreased compared to two weeks ago when it was 89.1.

## 2021-05-17 LAB — CBC WITH DIFFERENTIAL/PLATELET
Basophils Absolute: 0.1 10*3/uL (ref 0.0–0.2)
Basos: 0 %
EOS (ABSOLUTE): 0.9 10*3/uL — ABNORMAL HIGH (ref 0.0–0.4)
Eos: 1 %
Hematocrit: 41.2 % (ref 37.5–51.0)
Hemoglobin: 13.8 g/dL (ref 13.0–17.7)
Immature Grans (Abs): 0.6 10*3/uL — ABNORMAL HIGH (ref 0.0–0.1)
Immature Granulocytes: 1 %
Lymphocytes Absolute: 69.9 10*3/uL — ABNORMAL HIGH (ref 0.7–3.1)
Lymphs: 89 %
MCH: 31.4 pg (ref 26.6–33.0)
MCHC: 33.5 g/dL (ref 31.5–35.7)
MCV: 94 fL (ref 79–97)
Monocytes Absolute: 2.3 10*3/uL — ABNORMAL HIGH (ref 0.1–0.9)
Monocytes: 3 %
Neutrophils Absolute: 4.6 10*3/uL (ref 1.4–7.0)
Neutrophils: 6 %
Platelets: 176 10*3/uL (ref 150–450)
RBC: 4.39 x10E6/uL (ref 4.14–5.80)
RDW: 13.1 % (ref 11.6–15.4)
WBC: 78.4 10*3/uL (ref 3.4–10.8)

## 2021-05-20 ENCOUNTER — Telehealth: Payer: Self-pay | Admitting: Cardiology

## 2021-05-20 NOTE — Telephone Encounter (Signed)
Have him go back to taking diltiazem 120 mg.

## 2021-05-20 NOTE — Telephone Encounter (Signed)
Pt c/o medication issue:  1. Name of Medication: diltiazem (CARDIZEM CD) 180 MG 24 hr capsule  2. How are you currently taking this medication (dosage and times per day)? As prescribed  3. Are you having a reaction (difficulty breathing--STAT)? Yes   4. What is your medication issue? Nausea & headaches that come and go. Also reports he he feels pressure in his head. Has been occurring since the day after he started taking the increased dosage, which was four days ago. Please advise.

## 2021-05-21 ENCOUNTER — Other Ambulatory Visit: Payer: Self-pay

## 2021-05-21 ENCOUNTER — Ambulatory Visit (HOSPITAL_COMMUNITY): Payer: Medicare HMO | Attending: Cardiology

## 2021-05-21 DIAGNOSIS — R011 Cardiac murmur, unspecified: Secondary | ICD-10-CM | POA: Diagnosis not present

## 2021-05-21 LAB — ECHOCARDIOGRAM COMPLETE
Area-P 1/2: 2.5 cm2
S' Lateral: 2.8 cm

## 2021-05-21 MED ORDER — DILTIAZEM HCL ER COATED BEADS 120 MG PO CP24: 120.0000 mg | ORAL_CAPSULE | Freq: Every day | ORAL | 3 refills | Status: DC

## 2021-05-21 NOTE — Progress Notes (Signed)
Prescription sent to pharmacy.

## 2021-05-21 NOTE — Telephone Encounter (Signed)
Spoke with patient about decreasing Cardizem to 120 mg daily. Patient verbalizes understanding. No further questions or concerns at this time.

## 2021-05-29 ENCOUNTER — Other Ambulatory Visit: Payer: Self-pay

## 2021-05-29 ENCOUNTER — Encounter: Payer: Self-pay | Admitting: Cardiology

## 2021-05-29 ENCOUNTER — Ambulatory Visit: Payer: Medicare HMO | Admitting: Cardiology

## 2021-05-29 VITALS — BP 152/70 | HR 78 | Ht 69.0 in | Wt 152.2 lb

## 2021-05-29 DIAGNOSIS — I483 Typical atrial flutter: Secondary | ICD-10-CM

## 2021-05-29 DIAGNOSIS — Z01818 Encounter for other preprocedural examination: Secondary | ICD-10-CM | POA: Diagnosis not present

## 2021-05-29 DIAGNOSIS — Z01812 Encounter for preprocedural laboratory examination: Secondary | ICD-10-CM

## 2021-05-29 NOTE — Patient Instructions (Addendum)
Medication Instructions:  Your physician recommends that you continue on your current medications as directed. Please refer to the Current Medication list given to you today.  *If you need a refill on your cardiac medications before your next appointment, please call your pharmacy*   Lab Work: None ordered If you have labs (blood work) drawn today and your tests are completely normal, you will receive your results only by: Marland Kitchen MyChart Message (if you have MyChart) OR . A paper copy in the mail If you have any lab test that is abnormal or we need to change your treatment, we will call you to review the results.   Testing/Procedures: Your physician has recommended that you have an ablation. Catheter ablation is a medical procedure used to treat some cardiac arrhythmias (irregular heartbeats). During catheter ablation, a long, thin, flexible tube is put into a blood vessel in your groin (upper thigh), or neck. This tube is called an ablation catheter. It is then guided to your heart through the blood vessel. Radio frequency waves destroy small areas of heart tissue where abnormal heartbeats may cause an arrhythmia to start. Please see the instruction sheet below located under "other instructions".    Follow-Up: At [facility], you and your health needs are our priority.  As part of our continuing mission to provide you with exceptional heart care, we have created designated Provider Care Teams.  These Care Teams include your primary Cardiologist (physician) and Advanced Practice Providers (APPs -  Physician Assistants and Nurse Practitioners) who all work together to provide you with the care you need, when you need it.  We recommend signing up for the patient portal called "MyChart".  Sign up information is provided on this After Visit Summary.  MyChart is used to connect with patients for Virtual Visits (Telemedicine).  Patients are able to view lab/test results, encounter notes, upcoming  appointments, etc.  Non-urgent messages can be sent to your provider as well.   To learn more about what you can do with MyChart, go to [email or web address].    Your next appointment:   4 week(s) after your ablation  The format for your next appointment:   In Person  Provider:   Allegra Lai, MD    Thank you for choosing [facility]!!   Trinidad Curet, RN [phone]   Other Instructions    Electrophysiology/Ablation Procedure Instructions   You are scheduled for a(n)  ablation on 07/11/2021 with Dr. Allegra Lai.   1.   Pre procedure testing-             A.  LAB WORK --- On 07/04/2021  for your pre procedure blood work.  You do NOT need to be fasting.  You can stop by the office anytime that day between 7:30 - 4:30 pm   PROCEDURE DAY: 2. On the day of your procedure 07/11/2021 you will go to [facility] [phone]) at 11:30 am.  Dennis Bast will go to the main entrance A [employer]) and enter where the [employer] are.  Your driver will drop you off and you will head down the hallway to ADMITTING.  You may have one support person come in to the hospital with you.  They will be asked to wait in the waiting room.  It is OK to have someone drop you off and come back when you are ready to be discharged.   3.   Do not eat or drink after midnight prior to your  procedure.   4.   Do not miss any doses of your blood thinner prior to the morning of your procedure or your procedure will need to be rescheduled.       Do NOT take any medications the morning of your procedure.   5.  Plan for an overnight stay, but you may be discharged home after your procedure. If you use your phone frequently bring your phone charger, in case you have to stay.  If you are discharged after your procedure you will need someone to drive you home and be with your for 24 hours after your procedure.   6. You will follow up with Dr. Curt Bears  1 month after your procedure.  This appointment  will be made for you.   * If you have ANY questions please call the office [phone] and ask for Edwardine Deschepper RN or send me a MyChart message   * Occasionally, EP Studies and ablations can become lengthy.  Please make your family aware of this before your procedure starts.  Average time ranges from 2-8 hours for EP studies/ablations.  Your physician will call your family after the procedure with the results.                                    Cardiac Ablation Cardiac ablation is a procedure to destroy (ablate) some heart tissue that is sending bad signals. These bad signals cause problems in heart rhythm. The heart has many areas that make these signals. If there are problems in these areas, they can make the heart beat in a way that is not normal. Destroying some tissues can help make the heart rhythm normal. Tell your doctor about:  Any allergies you have.  All medicines you are taking. These include vitamins, herbs, eye drops, creams, and over-the-counter medicines.  Any problems you or family members have had with medicines that make you fall asleep (anesthetics).  Any blood disorders you have.  Any surgeries you have had.  Any medical conditions you have, such as kidney failure.  Whether you are pregnant or may be pregnant. What are the risks? This is a safe procedure. But problems may occur, including:  Infection.  Bruising and bleeding.  Bleeding into the chest.  Stroke or blood clots.  Damage to nearby areas of your body.  Allergies to medicines or dyes.  The need for a pacemaker if the normal system is damaged.  Failure of the procedure to treat the problem. What happens before the procedure? Medicines Ask your doctor about:  Changing or stopping your normal medicines. This is important.  Taking aspirin and ibuprofen. Do not take these medicines unless your doctor tells you to take them.  Taking other medicines, vitamins, herbs, and supplements. General  instructions  Follow instructions from your doctor about what you cannot eat or drink.  Plan to have someone take you home from the hospital or clinic.  If you will be going home right after the procedure, plan to have someone with you for 24 hours.  Ask your doctor what steps will be taken to prevent infection. What happens during the procedure?  An IV tube will be put into one of your veins.  You will be given a medicine to help you relax.  The skin on your neck or groin will be numbed.  A cut (incision) will be made in your neck or groin. A needle  will be put through your cut and into a large vein.  A tube (catheter) will be put into the needle. The tube will be moved to your heart.  Dye may be put through the tube. This helps your doctor see your heart.  Small devices (electrodes) on the tube will send out signals.  A type of energy will be used to destroy some heart tissue.  The tube will be taken out.  Pressure will be held on your cut. This helps stop bleeding.  A bandage will be put over your cut. The exact procedure may vary among doctors and hospitals.   What happens after the procedure?  You will be watched until you leave the hospital or clinic. This includes checking your heart rate, breathing rate, oxygen, and blood pressure.  Your cut will be watched for bleeding. You will need to lie still for a few hours.  Do not drive for 24 hours or as long as your doctor tells you. Summary  Cardiac ablation is a procedure to destroy some heart tissue. This is done to treat heart rhythm problems.  Tell your doctor about any medical conditions you may have. Tell him or her about all medicines you are taking to treat them.  This is a safe procedure. But problems may occur. These include infection, bruising, bleeding, and damage to nearby areas of your body.  Follow what your doctor tells you about food and drink. You may also be told to change or stop some of your  medicines.  After the procedure, do not drive for 24 hours or as long as your doctor tells you. This information is not intended to replace advice given to you by your health care provider. Make sure you discuss any questions you have with your health care provider. Document Revised: 11/16/2019 Document Reviewed: 11/16/2019 Elsevier Patient Education  2021 [employer].

## 2021-05-29 NOTE — Progress Notes (Signed)
Electrophysiology Office Note   Date:  05/29/2021   ID:  Keith Jordan, DOB 10-Aug-1941, MRN [id number]  PCP:  [name], MD  Cardiologist:  Tobb Primary Electrophysiologist:  Jorgen Wolfinger Meredith Leeds, MD    Chief Complaint: atrial flutter   History of Present Illness: Keith Jordan is a 80 y.o. male who is being seen today for the evaluation of atrial flutter at the request of Tobb, [name], DO. Presenting today for electrophysiology evaluation.  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  He had been having intermittent palpitations.  He was started on Eliquis and Cardizem.  Today, he denies symptoms of palpitations, chest pain, shortness of breath, orthopnea, PND, lower extremity edema, claudication, dizziness, presyncope, syncope, bleeding, or neurologic sequela. The patient is tolerating medications without difficulties.  He was started on diltiazem which did improve his symptoms.  At higher doses of his diltiazem, though he did have episodes of nausea.  He continues to have palpitations.  At times they wake him from sleep.   Past Medical History:  Diagnosis Date  . Abnormal breath sounds 10/11/2020  . Acute sinusitis 01/29/2021  . Advanced care planning/counseling discussion 09/01/2016  . BPH (benign prostatic hyperplasia) 09/29/2018  . Ear fullness, bilateral 04/03/2021  . Health maintenance examination 09/01/2016  . Hyperlipidemia   . Irregular heart beat 03/24/2021  . Ischemic optic neuropathy 2006   [place] [facility]  . Leukocytosis 01/30/2021   Severe s/p ER eval 01/2021  . Medicare annual wellness visit, subsequent 10/04/2019  . Nausea without vomiting 03/24/2021  . Personal history of colonic adenoma 07/30/2008  . Prediabetes 07/05/2016   A1c 6.5% 03/2014   . Prediabetes 07/05/2016   A1c 6.5% 03/2014   . Primary osteoarthritis of knee    bilateral s/p L knee replacement, receives R knee  injections [name], [place])  . Sebaceous cyst 01/30/2020  . Splenic marginal zone b-cell lymphoma ([place]) 02/05/2021   Past Surgical History:  Procedure Laterality Date  . COLONOSCOPY  12/2013   no polyps, no rpt due [name])  . CYST EXCISION  01/2020  . TOTAL KNEE ARTHROPLASTY Left 2005   [name]     Current Outpatient Medications  Medication Sig Dispense Refill  . acetaminophen (TYLENOL) 500 MG tablet Take 500 mg by mouth every 6 (six) hours as needed for mild pain.    Marland Kitchen allopurinol (ZYLOPRIM) 300 MG tablet TAKE 1 TABLET BY MOUTH EVERY DAY 30 tablet 1  . apixaban (ELIQUIS) 5 MG TABS tablet Take 1 tablet (5 mg total) by mouth 2 (two) times daily. 180 tablet 3  . Ascorbic Acid (VITAMIN C) 1000 MG tablet Take 500 mg by mouth 3 (three) times a week.    Marland Kitchen atorvastatin (LIPITOR) 10 MG tablet Take 1 tablet (10 mg total) by mouth daily. 90 tablet 3  . carboxymethylcellulose (REFRESH PLUS) 0.5 % SOLN Place 1 drop into both eyes 2 (two) times daily as needed (dry eyes).    Marland Kitchen diltiazem (CARDIZEM CD) 120 MG 24 hr capsule Take 1 capsule (120 mg total) by mouth daily. 90 capsule 3  . Multiple Vitamins-Minerals (PRESERVISION AREDS) CAPS Take 1 capsule by mouth in the morning and at bedtime. 180 capsule 3  . ondansetron (ZOFRAN) 8 MG tablet Take 1 tablet (8 mg total) by mouth every 8 (eight) hours as needed for nausea or vomiting. 30 tablet 0  . sodium chloride (OCEAN) 0.65 % SOLN nasal spray Place  1 spray into both nostrils as needed for congestion.     No current facility-administered medications for this visit.    Allergies:   Patient has no known allergies.   Social History:  The patient  reports that he has never smoked. He has never used smokeless tobacco. He reports current alcohol use. He reports that he does not use drugs.   Family History:  The patient's family history includes Arthritis in his father; Blindness (age of onset: 28) in his mother; Lung disease in his father.    ROS:  Please  see the history of present illness.   Otherwise, review of systems is positive for none.   All other systems are reviewed and negative.    PHYSICAL EXAM: VS:  BP (!) 152/70   Pulse 78   Ht 5\' 9"  (1.753 m)   Wt 152 lb 3.2 oz (69 kg)   SpO2 95%   BMI 22.48 kg/m  , BMI Body mass index is 22.48 kg/m. GEN: Well nourished, well developed, in no acute distress  HEENT: normal  Neck: no JVD, carotid bruits, or masses Cardiac: RRR; no murmurs, rubs, or gallops,no edema  Respiratory:  clear to auscultation bilaterally, normal work of breathing GI: soft, nontender, nondistended, + BS MS: no deformity or atrophy  Skin: warm and dry Neuro:  Strength and sensation are intact Psych: euthymic mood, full affect  EKG:  EKG is not ordered today. Personal review of the ekg ordered 04/17/21 shows sinus rhythm, first-degree AV block  Recent Labs: 01/30/2021: TSH 1.969 05/01/2021: ALT 10; BUN 21; Creatinine 1.20; Potassium 4.8; Sodium 140 05/14/2021: Hemoglobin 13.8; Platelets 176    Lipid Panel     Component Value Date/Time   CHOL 120 10/03/2020 1115   CHOL 140 04/16/2015 0000   CHOL 140 04/16/2015 0000   TRIG 106.0 10/03/2020 1115   TRIG 94 04/16/2015 0000   TRIG 94 04/16/2015 0000   HDL 27.90 (L) 10/03/2020 1115   CHOLHDL 4 10/03/2020 1115   VLDL 21.2 10/03/2020 1115   LDLCALC 71 10/03/2020 1115   LDLCALC 86 04/16/2015 0000   LDLCALC 86 04/16/2015 0000     Wt Readings from Last 3 Encounters:  05/29/21 152 lb 3.2 oz (69 kg)  05/14/21 151 lb 1.3 oz (68.5 kg)  05/01/21 151 lb 3.2 oz (68.6 kg)      Other studies Reviewed: Additional studies/ records that were reviewed today include: TTE 05/21/21  Review of the above records today demonstrates:  1. Left ventricular ejection fraction, by estimation, is 70 to 75%. The  left ventricle has hyperdynamic function. The left ventricle has no  regional wall motion abnormalities. Left ventricular diastolic parameters  are consistent with Grade I  diastolic  dysfunction (impaired relaxation).  2. Right ventricular systolic function is normal. The right ventricular  size is mildly enlarged. There is mildly elevated pulmonary artery  systolic pressure. The estimated right ventricular systolic pressure is  32.9 mmHg.  3. The mitral valve is normal in structure. No evidence of mitral valve  regurgitation. No evidence of mitral stenosis.  4. The aortic valve is normal in structure. Aortic valve regurgitation is  not visualized. No aortic stenosis is present.  5. The inferior vena cava is normal in size with greater than 50%  respiratory variability, suggesting right atrial pressure of 3 mmHg.   Cardiac monitor 05/12/2020 personally reviewed 1.  Symptomatic atrial flutter 2.  Occasional premature atrial complexes.  ASSESSMENT AND PLAN:  1.  Typical atrial flutter:  Currently on Eliquis and Cardizem.  CHA2DS2-VASc of at least 3.  At this point, rhythm control would be beneficial.  We discussed medications versus ablation.  He would like to avoid medications.  Risk and benefits of ablation have been discussed.  Risk include bleeding, tamponade, heart block, stroke, among others.  The patient understands the risks and has agreed to the procedure.  2.  Hypertension: Mildly elevated today.  Well controlled in the past.  No changes.  3.  Hyperlipidemia: Continue statin per primary physician.    Current medicines are reviewed at length with the patient today.   The patient does not have concerns regarding his medicines.  The following changes were made today:  none  Labs/ tests ordered today include:  Orders Placed This Encounter  Procedures  . Basic metabolic panel  . CBC     Disposition:   FU with Lourdes Manning 3 months  Signed, Treniece Holsclaw Meredith Leeds, MD  05/29/2021 11:14 AM     [facility] [address] [place] [address] [phone] (office) [phone] (fax)

## 2021-05-30 ENCOUNTER — Inpatient Hospital Stay: Payer: Medicare HMO

## 2021-05-30 ENCOUNTER — Inpatient Hospital Stay: Payer: Medicare HMO | Attending: Hematology and Oncology | Admitting: Hematology and Oncology

## 2021-05-30 ENCOUNTER — Encounter: Payer: Self-pay | Admitting: Hematology and Oncology

## 2021-05-30 VITALS — BP 138/67 | HR 72 | Temp 97.8°F | Resp 18 | Ht 69.0 in | Wt 152.5 lb

## 2021-05-30 DIAGNOSIS — I499 Cardiac arrhythmia, unspecified: Secondary | ICD-10-CM | POA: Diagnosis not present

## 2021-05-30 DIAGNOSIS — R110 Nausea: Secondary | ICD-10-CM | POA: Insufficient documentation

## 2021-05-30 DIAGNOSIS — D72820 Lymphocytosis (symptomatic): Secondary | ICD-10-CM

## 2021-05-30 DIAGNOSIS — C8307 Small cell B-cell lymphoma, spleen: Secondary | ICD-10-CM | POA: Diagnosis not present

## 2021-05-30 LAB — CBC WITH DIFFERENTIAL (CANCER CENTER ONLY)
Abs Immature Granulocytes: 0.46 10*3/uL — ABNORMAL HIGH (ref 0.00–0.07)
Basophils Absolute: 0.1 10*3/uL (ref 0.0–0.1)
Basophils Relative: 0 %
Eosinophils Absolute: 0.8 10*3/uL — ABNORMAL HIGH (ref 0.0–0.5)
Eosinophils Relative: 1 %
HCT: 42 % (ref 39.0–52.0)
Hemoglobin: 13.6 g/dL (ref 13.0–17.0)
Immature Granulocytes: 1 %
Lymphocytes Relative: 90 %
Lymphs Abs: 76.8 10*3/uL — ABNORMAL HIGH (ref 0.7–4.0)
MCH: 31.1 pg (ref 26.0–34.0)
MCHC: 32.4 g/dL (ref 30.0–36.0)
MCV: 96.1 fL (ref 80.0–100.0)
Monocytes Absolute: 2.4 10*3/uL — ABNORMAL HIGH (ref 0.1–1.0)
Monocytes Relative: 3 %
Neutro Abs: 4.2 10*3/uL (ref 1.7–7.7)
Neutrophils Relative %: 5 %
Platelet Count: 152 10*3/uL (ref 150–400)
RBC: 4.37 MIL/uL (ref 4.22–5.81)
RDW: 14.1 % (ref 11.5–15.5)
WBC Count: 84.7 10*3/uL (ref 4.0–10.5)
nRBC: 0 % (ref 0.0–0.2)

## 2021-05-30 LAB — CMP (CANCER CENTER ONLY)
ALT: 10 U/L (ref 0–44)
AST: 23 U/L (ref 15–41)
Albumin: 3.7 g/dL (ref 3.5–5.0)
Alkaline Phosphatase: 87 U/L (ref 38–126)
Anion gap: 11 (ref 5–15)
BUN: 26 mg/dL — ABNORMAL HIGH (ref 8–23)
CO2: 28 mmol/L (ref 22–32)
Calcium: 9.2 mg/dL (ref 8.9–10.3)
Chloride: 101 mmol/L (ref 98–111)
Creatinine: 1.36 mg/dL — ABNORMAL HIGH (ref 0.61–1.24)
GFR, Estimated: 53 mL/min — ABNORMAL LOW (ref >60)
Glucose, Bld: 205 mg/dL — ABNORMAL HIGH (ref 70–99)
Potassium: 4.1 mmol/L (ref 3.5–5.1)
Sodium: 140 mmol/L (ref 135–145)
Total Bilirubin: 0.6 mg/dL (ref 0.3–1.2)
Total Protein: 6.6 g/dL (ref 6.5–8.1)

## 2021-05-30 LAB — LACTATE DEHYDROGENASE: LDH: 163 U/L (ref 98–192)

## 2021-05-30 NOTE — Progress Notes (Signed)
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 Referred to Emergency Department. 02/05/2021: establish care with Dr. Lorenso Courier  02/24/2021: Week 1 of Monotherapy Rituximab 03/03/2021:  Week 2 of Monotherapy Rituximab 03/10/2021:  Week 3 of Monotherapy Rituximab. WBC 109.8 03/17/2021: Week 4 of Monotherapy Rituximab. WBC 94.8 03/24/2021: WBC 59.8 05/01/2021: WBC 89.1  Interval History:  Keith Jordan 80 y.o. male with medical history significant for splenic marginal zone lymphoma who presents for a follow up visit. The patient's last visit was on 05/01/2021. In the interim since the last visit he has been in stable health.   On exam today Keith Jordan reports that he continues to have lower back pain.  He reports it has not worsened in the interim since her last visit but also has not improved.  He notes it is causing him 0-10 pain while sitting here, however if he were to sit up for long period of time he would begin develop some soreness in the back.  He reports he is also having pain in his left shoulder which he cannot quite explain.  Fortunately he is not having any other systemic symptoms.  He otherwise has not had any issues with fevers, chills, sweats, nausea, vomiting or diarrhea.  He notes that his appetite is good and his weight is currently stable at 152 pounds.  He has no other complaints.   A full 10 point ROS is listed below.  MEDICAL HISTORY:  Past Medical History:  Diagnosis Date  . Abnormal breath sounds 10/11/2020  . Acute sinusitis 01/29/2021  . Advanced care planning/counseling discussion 09/01/2016  . BPH (benign  prostatic hyperplasia) 09/29/2018  . Ear fullness, bilateral 04/03/2021  . Health maintenance examination 09/01/2016  . Hyperlipidemia   . Irregular heart beat 03/24/2021  . Ischemic optic neuropathy 2006   [place] [facility]  . Leukocytosis 01/30/2021   Severe s/p ER eval 01/2021  . Medicare annual wellness visit, subsequent 10/04/2019  . Nausea without vomiting 03/24/2021  . Personal history of colonic adenoma 07/30/2008  . Prediabetes 07/05/2016   A1c 6.5% 03/2014   . Prediabetes 07/05/2016   A1c 6.5% 03/2014   . Primary osteoarthritis of knee    bilateral s/p L knee replacement, receives R knee injections [name], [place])  . Sebaceous cyst 01/30/2020  . Splenic marginal zone b-cell lymphoma ([place]) 02/05/2021    SURGICAL HISTORY: Past Surgical History:  Procedure Laterality Date  . COLONOSCOPY  12/2013   no polyps, no rpt due [name])  . CYST EXCISION  01/2020  . TOTAL KNEE ARTHROPLASTY Left 2005   [name]    SOCIAL HISTORY: Social History   Socioeconomic History  . Marital status: Married    Spouse name: Not on file  . Number of children: Not on file  . Years of education: Not on file  . Highest education level: Not on file  Occupational History  . Not on file  Tobacco Use  . Smoking status: Never Smoker  . Smokeless tobacco: Never Used  Vaping Use  . Vaping Use: Never used  Substance and Sexual Activity  . Alcohol use: Yes    Comment: occasional  . Drug use: No  . Sexual activity: Not  Currently  Other Topics Concern  . Not on file  Social History Narrative   Lives with wife, 2 cats   Occ: retired Personal assistant   Activity: works in yard   Diet: some water, fruits/vegetables daily   Social Determinants of Radio broadcast assistant Strain: [place]   . Difficulty of Paying Living Expenses: Not hard at all  Food Insecurity: No Food Insecurity  . Worried About Charity fundraiser in the Last Year: Never true  . Ran Out of Food in the Last Year: Never true  Transportation  Needs: No Transportation Needs  . Lack of Transportation (Medical): No  . Lack of Transportation (Non-Medical): No  Physical Activity: Inactive  . Days of Exercise per Week: 0 days  . Minutes of Exercise per Session: 0 min  Stress: No Stress Concern Present  . Feeling of Stress : Not at all  Social Connections: Not on file  Intimate Partner Violence: Not At Risk  . Fear of Current or Ex-Partner: No  . Emotionally Abused: No  . Physically Abused: No  . Sexually Abused: No    FAMILY HISTORY: Family History  Problem Relation Age of Onset  . Blindness Mother 73       ?stroke in eyes  . Lung disease Father        Ecologist  . Arthritis Father   . Colon cancer Neg Hx   . Pancreatic cancer Neg Hx   . Stomach cancer Neg Hx   . Esophageal cancer Neg Hx   . Rectal cancer Neg Hx   . Cancer Neg Hx   . Diabetes Neg Hx   . CAD Neg Hx   . Stroke Neg Hx     ALLERGIES:  has No Known Allergies.  MEDICATIONS:  Current Outpatient Medications  Medication Sig Dispense Refill  . acetaminophen (TYLENOL) 500 MG tablet Take 500 mg by mouth every 6 (six) hours as needed for mild pain.    Marland Kitchen allopurinol (ZYLOPRIM) 300 MG tablet TAKE 1 TABLET BY MOUTH EVERY DAY (Patient not taking: Reported on 05/30/2021) 30 tablet 1  . apixaban (ELIQUIS) 5 MG TABS tablet Take 1 tablet (5 mg total) by mouth 2 (two) times daily. 180 tablet 3  . Ascorbic Acid (VITAMIN C) 1000 MG tablet Take 500 mg by mouth 3 (three) times a week.    Marland Kitchen atorvastatin (LIPITOR) 10 MG tablet Take 1 tablet (10 mg total) by mouth daily. 90 tablet 3  . carboxymethylcellulose (REFRESH PLUS) 0.5 % SOLN Place 1 drop into both eyes 2 (two) times daily as needed (dry eyes).    Marland Kitchen diltiazem (CARDIZEM CD) 120 MG 24 hr capsule Take 1 capsule (120 mg total) by mouth daily. 90 capsule 3  . Multiple Vitamins-Minerals (PRESERVISION AREDS) CAPS Take 1 capsule by mouth in the morning and at bedtime. 180 capsule 3  . ondansetron (ZOFRAN) 8 MG tablet Take 1  tablet (8 mg total) by mouth every 8 (eight) hours as needed for nausea or vomiting. 30 tablet 0  . sodium chloride (OCEAN) 0.65 % SOLN nasal spray Place 1 spray into both nostrils as needed for congestion.     No current facility-administered medications for this visit.    REVIEW OF SYSTEMS:   Constitutional: ( - ) fevers, ( - )  chills , ( - ) night sweats Eyes: ( - ) blurriness of vision, ( - ) double vision, ( - ) watery eyes Ears, nose, mouth, throat, and face: ( - )  mucositis, ( - ) sore throat Respiratory: ( - ) cough, ( - ) dyspnea, ( - ) wheezes Cardiovascular: ( - ) palpitation, ( - ) chest discomfort, ( - ) lower extremity swelling Gastrointestinal:  ( + ) nausea, ( - ) heartburn, ( - ) change in bowel habits Skin: ( - ) abnormal skin rashes Lymphatics: ( - ) new lymphadenopathy, ( - ) easy bruising Neurological: ( - ) numbness, ( + ) tingling, ( - ) new weaknesses Behavioral/Psych: ( - ) mood change, ( - ) new changes  All other systems were reviewed with the patient and are negative.  PHYSICAL EXAMINATION: ECOG PERFORMANCE STATUS: 1 - Symptomatic but completely ambulatory  Vitals:   05/30/21 1019  BP: 138/67  Pulse: 72  Resp: 18  Temp: 97.8 F (36.6 C)  SpO2: 97%   Filed Weights   05/30/21 1019  Weight: 152 lb 8 oz (69.2 kg)    GENERAL: well appearing elderly Caucasian male alert, no distress and comfortable SKIN: skin color, texture, turgor are normal, no rashes or significant lesions EYES: conjunctiva are pink and non-injected, sclera clear LUNGS: clear to auscultation and percussion with normal breathing effort HEART: regular rate & rhythm and no murmurs and no lower extremity edema Musculoskeletal: no cyanosis of digits and no clubbing  PSYCH: alert & oriented x 3, fluent speech NEURO: no focal motor/sensory deficits  LABORATORY DATA:  I have reviewed the data as listed CBC Latest Ref Rng & Units 05/30/2021 05/14/2021 05/01/2021  WBC 4.0 - 10.5 K/uL  84.7(HH) 78.4(HH) 89.1(HH)  Hemoglobin 13.0 - 17.0 g/dL 13.6 13.8 13.5  Hematocrit 39.0 - 52.0 % 42.0 41.2 41.1  Platelets 150 - 400 K/uL 152 176 136(L)    CMP Latest Ref Rng & Units 05/30/2021 05/01/2021 04/07/2021  Glucose 70 - 99 mg/dL 205(H) 142(H) 163(H)  BUN 8 - 23 mg/dL 26(H) 21 22  Creatinine 0.61 - 1.24 mg/dL 1.36(H) 1.20 1.13  Sodium 135 - 145 mmol/L 140 140 141  Potassium 3.5 - 5.1 mmol/L 4.1 4.8 4.6  Chloride 98 - 111 mmol/L 101 102 103  CO2 22 - 32 mmol/L _0 Calcium 8.9 - 10.3 mg/dL 9.2 9.4 9.0  Total Protein 6.5 - 8.1 g/dL 6.6 6.7 6.6  Total Bilirubin 0.3 - 1.2 mg/dL 0.6 0.6 0.7  Alkaline Phos 38 - 126 U/L 87 80 77  AST 15 - 41 U/L _1 ALT 0 - 44 U/L _2 RADIOGRAPHIC STUDIES: ECHOCARDIOGRAM COMPLETE  Result Date: 05/21/2021    ECHOCARDIOGRAM REPORT   Patient Name:   Keith Jordan [facility] Date of Exam: 05/21/2021 Medical Rec #:  [id number]      Height:       69.0 in Accession #:    [id number]     Weight:       151.1 lb Date of Birth:  Aug 12, 1941      BSA:          1.834 m Patient Age:    7 years       BP:           144/60 mmHg Patient Gender: M              HR:           70 bpm. Exam Location:  [address] Procedure: 2D Echo, Cardiac Doppler and Color Doppler Indications:    R01.1 Murmur  History:        Patient has  no prior history of Echocardiogram examinations.                 Risk Factors:Hypertension and Prediabetic.  Sonographer:    Cresenciano Lick RDCS Referring Phys: [id number] [place]  1. Left ventricular ejection fraction, by estimation, is 70 to 75%. The left ventricle has hyperdynamic function. The left ventricle has no regional wall motion abnormalities. Left ventricular diastolic parameters are consistent with Grade I diastolic dysfunction (impaired relaxation).  2. Right ventricular systolic function is normal. The right ventricular size is mildly enlarged. There is mildly elevated pulmonary artery systolic pressure. The estimated right  ventricular systolic pressure is 68.3 mmHg.  3. The mitral valve is normal in structure. No evidence of mitral valve regurgitation. No evidence of mitral stenosis.  4. The aortic valve is normal in structure. Aortic valve regurgitation is not visualized. No aortic stenosis is present.  5. The inferior vena cava is normal in size with greater than 50% respiratory variability, suggesting right atrial pressure of 3 mmHg. FINDINGS  Left Ventricle: Left ventricular ejection fraction, by estimation, is 70 to 75%. The left ventricle has hyperdynamic function. The left ventricle has no regional wall motion abnormalities. The left ventricular internal cavity size was normal in size. There is no left ventricular hypertrophy. Left ventricular diastolic parameters are consistent with Grade I diastolic dysfunction (impaired relaxation). Right Ventricle: The right ventricular size is mildly enlarged. No increase in right ventricular wall thickness. Right ventricular systolic function is normal. There is mildly elevated pulmonary artery systolic pressure. The tricuspid regurgitant velocity is 2.89 m/s, and with an assumed right atrial pressure of 3 mmHg, the estimated right ventricular systolic pressure is 41.9 mmHg. Left Atrium: Left atrial size was normal in size. Right Atrium: Right atrial size was normal in size. Pericardium: There is no evidence of pericardial effusion. Mitral Valve: The mitral valve is normal in structure. No evidence of mitral valve regurgitation. No evidence of mitral valve stenosis. Tricuspid Valve: The tricuspid valve is normal in structure. Tricuspid valve regurgitation is mild . No evidence of tricuspid stenosis. Aortic Valve: The aortic valve is normal in structure. Aortic valve regurgitation is not visualized. No aortic stenosis is present. Pulmonic Valve: The pulmonic valve was normal in structure. Pulmonic valve regurgitation is not visualized. No evidence of pulmonic stenosis. Aorta: The aortic  root is normal in size and structure. Venous: The inferior vena cava is normal in size with greater than 50% respiratory variability, suggesting right atrial pressure of 3 mmHg. IAS/Shunts: No atrial level shunt detected by color flow Doppler.  LEFT VENTRICLE PLAX 2D LVIDd:         4.20 cm  Diastology LVIDs:         2.80 cm  LV e' medial:    8.27 cm/s LV PW:         0.90 cm  LV E/e' medial:  9.2 LV IVS:        1.00 cm  LV e' lateral:   12.50 cm/s LVOT diam:     2.00 cm  LV E/e' lateral: 6.1 LV SV:         97 LV SV Index:   53 LVOT Area:     3.14 cm  RIGHT VENTRICLE             IVC RV Basal diam:  4.00 cm     IVC diam: 1.60 cm RV S prime:     14.90 cm/s TAPSE (M-mode): 3.2 cm LEFT ATRIUM  Index       RIGHT ATRIUM           Index LA diam:        4.00 cm 2.18 cm/m  RA Area:     14.60 cm LA Vol (A2C):   46.0 ml 25.08 ml/m RA Volume:   40.40 ml  22.03 ml/m LA Vol (A4C):   31.9 ml 17.40 ml/m LA Biplane Vol: 40.0 ml 21.81 ml/m  AORTIC VALVE LVOT Vmax:   133.50 cm/s LVOT Vmean:  95.950 cm/s LVOT VTI:    0.310 m  AORTA Ao Root diam: 3.30 cm Ao Asc diam:  3.20 cm MITRAL VALVE               TRICUSPID VALVE MV Area (PHT): 2.50 cm    TR Peak grad:   33.4 mmHg MV Decel Time: 303 msec    TR Vmax:        289.00 cm/s MV E velocity: 76.20 cm/s MV A velocity: 80.00 cm/s  SHUNTS MV E/A ratio:  0.95        Systemic VTI:  0.31 m                            Systemic Diam: 2.00 cm Candee Furbish MD Electronically signed by Candee Furbish MD Signature Date/Time: 05/21/2021/1:28:50 PM    Final    LONG TERM MONITOR-LIVE TELEMETRY (3-14 DAYS)  Result Date: 05/12/2021 The patient wore the monitor for 13 days, 21 hours starting April 17, 2021.  Indication: Palpitations The minimum heart rate was 46 bpm, maximum heart rate was 149 bpm, and average heart rate was 68 bpm. Predominant underlying rhythm was Sinus Rhythm. 3 Supraventricular Tachycardia runs occurred, the run with the fastest interval lasting 4 beats with a max rate of  126 bpm, the longest lasting 7 beats with an avg rate of 103 bpm.  Atrial Flutter occurred (3% burden), ranging from 46-149 bpm (avg of 82 bpm), the longest lasting 1 hour 41 mins with an avg rate of 92 bpm. Premature atrial complexes were occasional (1.0%, 13493). Premature Ventricular complexes were rare less than 1%. No ventricular tachycardia, no pauses present. Symptoms are associated with atrial flutter, sinus rhythm and premature atrial complexes.  Conclusion: This study is remarkable for the following:   1.  Symptomatic atrial flutter   2.  Occasional premature atrial complexes.   ASSESSMENT & PLAN Keith Jordan 80 y.o. male with medical history significant for splenic marginal zone lymphoma. CT CAP from 02/13/21  showed only splenic involvement and a bone marrow biopsy from 02/21/21 confirmed the diagnosis.  The patient has only leukocytosis and splenomegaly.  The recommendation is monotherapy rituximab 375 mg per metered squared q. 7 days x 4 doses. He completed this on 03/17/2021.    Patient is doing well without any significant limitations. Labs from today show stably elevated leukocytosis but the platelet count has normalized. Plan for patient to return to the clinic in 4 weeks.  #Splenic Marginal Zone Lymphoma, stable --findings are consistent with a splenic marginal zone lymphoma. --patient completed bone marrow biopsy and a CT chest abdomen pelvis. Disease appear limited to peripheral blood and spleen.  --Treatment of choice with confirmed splenic marginal zone lymphoma is rituximab 375 mg/m q. 7 days x 4 doses. He completed this on 03/17/2021.  --Due to persistent lymphocytosis, recommend to continue with allopurinol to prevent TLS. --if there is concern for worsening disease with an indication  for treatment can consider treatment with Ibrutinib/Zanbrutinib vs Bendamustine +ritux/obinutuzumab. Would need caution with ibrutinib given patient's cardiac history and current anticoagulation  therapy.  --RTC in 3 months with labs   #Nausea --Take Zofran 8 mg q 8 hours PRN.   #Irregular heart beat --patient has established care with Cardiology  Orders Placed This Encounter  Procedures  . DG Lumbar Spine Complete    Standing Status:   Future    Standing Expiration Date:   05/30/2022    Order Specific Question:   Reason for Exam (SYMPTOM  OR DIAGNOSIS REQUIRED)    Answer:   lower back pain    Order Specific Question:   Preferred imaging location?    Answer:   [place] Shoulder Left    Standing Status:   Future    Standing Expiration Date:   05/30/2022    Order Specific Question:   Reason for Exam (SYMPTOM  OR DIAGNOSIS REQUIRED)    Answer:   shoulder pain    Order Specific Question:   Preferred Imaging Location?    Answer:   [facility]    All questions were answered. The patient knows to call the clinic with any problems, questions or concerns.  A total of more than 30 minutes were spent on this encounter and over half of that time was spent on counseling and coordination of care as outlined above.   Ledell Peoples, MD Department of Hematology/Oncology [place] [facility] Phone: [phone] Pager: [phone] Email: [email or web address]

## 2021-06-03 ENCOUNTER — Telehealth: Payer: Self-pay | Admitting: Hematology and Oncology

## 2021-06-03 NOTE — Telephone Encounter (Signed)
Scheduled per los. Called and spoke with patient. Confirmed appt 

## 2021-06-05 ENCOUNTER — Telehealth: Payer: Self-pay | Admitting: Family Medicine

## 2021-06-05 NOTE — Chronic Care Management (AMB) (Signed)
  Chronic Care Management   Outreach Note  06/05/2021 Name: Keith Jordan MRN: [id number] DOB: 11/30/1941  Referred by: [name], MD Reason for referral : No chief complaint on file.   An unsuccessful telephone outreach was attempted today. The patient was referred to the pharmacist for assistance with care management and care coordination.   Follow Up Plan:   Tatjana Dellinger Upstream Scheduler

## 2021-06-12 ENCOUNTER — Telehealth: Payer: Self-pay | Admitting: Family Medicine

## 2021-06-12 NOTE — Chronic Care Management (AMB) (Signed)
  Chronic Care Management   Outreach Note  06/12/2021 Name: Keith Jordan MRN: [id number] DOB: 1941-03-03  Referred by: [name], MD Reason for referral : No chief complaint on file.   A second unsuccessful telephone outreach was attempted today. The patient was referred to pharmacist for assistance with care management and care coordination.  Follow Up Plan:   Tatjana Dellinger Upstream Scheduler

## 2021-07-04 ENCOUNTER — Other Ambulatory Visit: Payer: Self-pay

## 2021-07-04 ENCOUNTER — Telehealth: Payer: Self-pay | Admitting: Cardiology

## 2021-07-04 ENCOUNTER — Other Ambulatory Visit: Payer: Medicare HMO | Admitting: *Deleted

## 2021-07-04 DIAGNOSIS — I483 Typical atrial flutter: Secondary | ICD-10-CM

## 2021-07-04 DIAGNOSIS — Z01812 Encounter for preprocedural laboratory examination: Secondary | ICD-10-CM | POA: Diagnosis not present

## 2021-07-04 LAB — BASIC METABOLIC PANEL
BUN/Creatinine Ratio: 14 (ref 10–24)
BUN: 18 mg/dL (ref 8–27)
CO2: 26 mmol/L (ref 20–29)
Calcium: 9.3 mg/dL (ref 8.6–10.2)
Chloride: 101 mmol/L (ref 96–106)
Creatinine, Ser: 1.26 mg/dL (ref 0.76–1.27)
Glucose: 122 mg/dL — ABNORMAL HIGH (ref 65–99)
Potassium: 4.1 mmol/L (ref 3.5–5.2)
Sodium: 140 mmol/L (ref 134–144)
eGFR: 58 mL/min/{1.73_m2} — ABNORMAL LOW (ref >59)

## 2021-07-04 LAB — CBC
Hematocrit: 41 % (ref 37.5–51.0)
Hemoglobin: 13.4 g/dL (ref 13.0–17.7)
MCH: 30.5 pg (ref 26.6–33.0)
MCHC: 32.7 g/dL (ref 31.5–35.7)
MCV: 93 fL (ref 79–97)
Platelets: 135 10*3/uL — ABNORMAL LOW (ref 150–450)
RBC: 4.39 x10E6/uL (ref 4.14–5.80)
RDW: 12.5 % (ref 11.6–15.4)
WBC: 96.5 10*3/uL (ref 3.4–10.8)

## 2021-07-04 NOTE — Telephone Encounter (Signed)
Received call from [facility] regarding critical lab value of WBC 96.5.  Patient has history of B-cell lymphoma.  Review of previous white blood cell count indicate range of 60-90.  Does not appear to be acute change.  Will continue with current management.

## 2021-07-10 NOTE — Pre-Procedure Instructions (Signed)
Instructed patient on the following items: Arrival time 1130 Nothing to eat or drink after midnight No meds AM of procedure Responsible person to drive you home and stay with you for 24 hrs  Have you missed any doses of anti-coagulant Eliquis- hasn't missed any doses    

## 2021-07-11 ENCOUNTER — Ambulatory Visit (HOSPITAL_COMMUNITY): Payer: Medicare HMO | Admitting: Anesthesiology

## 2021-07-11 ENCOUNTER — Encounter (HOSPITAL_COMMUNITY)
Admission: RE | Disposition: A | Discharge status: Home / Self Care | Payer: Self-pay | Source: Ambulatory Visit | Attending: Cardiology

## 2021-07-11 ENCOUNTER — Ambulatory Visit (HOSPITAL_COMMUNITY)
Admission: RE | Admit: 2021-07-11 | Discharge: 2021-07-11 | Disposition: A | Discharge status: Home / Self Care | Payer: Medicare HMO | Source: Ambulatory Visit | Attending: Cardiology | Admitting: Cardiology

## 2021-07-11 ENCOUNTER — Other Ambulatory Visit: Payer: Self-pay

## 2021-07-11 DIAGNOSIS — L723 Sebaceous cyst: Secondary | ICD-10-CM | POA: Diagnosis not present

## 2021-07-11 DIAGNOSIS — I483 Typical atrial flutter: Secondary | ICD-10-CM | POA: Insufficient documentation

## 2021-07-11 DIAGNOSIS — N400 Enlarged prostate without lower urinary tract symptoms: Secondary | ICD-10-CM | POA: Diagnosis not present

## 2021-07-11 DIAGNOSIS — E785 Hyperlipidemia, unspecified: Secondary | ICD-10-CM | POA: Diagnosis not present

## 2021-07-11 DIAGNOSIS — I4892 Unspecified atrial flutter: Secondary | ICD-10-CM

## 2021-07-11 DIAGNOSIS — Z8572 Personal history of non-Hodgkin lymphomas: Secondary | ICD-10-CM | POA: Diagnosis not present

## 2021-07-11 DIAGNOSIS — R7303 Prediabetes: Secondary | ICD-10-CM | POA: Insufficient documentation

## 2021-07-11 DIAGNOSIS — Z79899 Other long term (current) drug therapy: Secondary | ICD-10-CM | POA: Insufficient documentation

## 2021-07-11 DIAGNOSIS — Z7901 Long term (current) use of anticoagulants: Secondary | ICD-10-CM | POA: Diagnosis not present

## 2021-07-11 HISTORY — PX: A-FLUTTER ABLATION: EP1230

## 2021-07-11 SURGERY — A-FLUTTER ABLATION
Anesthesia: General

## 2021-07-11 MED ORDER — SODIUM CHLORIDE 0.9 % IV SOLN: 250.0000 mL | INTRAVENOUS | Status: DC | PRN

## 2021-07-11 MED ORDER — SODIUM CHLORIDE 0.9 % IV SOLN: INTRAVENOUS | Status: DC

## 2021-07-11 MED ORDER — PHENYLEPHRINE HCL-NACL 10-0.9 MG/250ML-% IV SOLN
INTRAVENOUS | Status: DC | PRN
  Administered 2021-07-11: 35 ug/min via INTRAVENOUS

## 2021-07-11 MED ORDER — SODIUM CHLORIDE 0.9% FLUSH: 3.0000 mL | INTRAVENOUS | Status: DC | PRN

## 2021-07-11 MED ORDER — HEPARIN (PORCINE) IN NACL 1000-0.9 UT/500ML-% IV SOLN
INTRAVENOUS | Status: DC | PRN
  Administered 2021-07-11: 500 mL

## 2021-07-11 MED ORDER — PHENYLEPHRINE 40 MCG/ML (10ML) SYRINGE FOR IV PUSH (FOR BLOOD PRESSURE SUPPORT)
PREFILLED_SYRINGE | INTRAVENOUS | Status: DC | PRN
  Administered 2021-07-11: 200 ug via INTRAVENOUS

## 2021-07-11 MED ORDER — ROCURONIUM BROMIDE 10 MG/ML (PF) SYRINGE
PREFILLED_SYRINGE | INTRAVENOUS | Status: DC | PRN
  Administered 2021-07-11: 60 mg via INTRAVENOUS

## 2021-07-11 MED ORDER — ACETAMINOPHEN 325 MG PO TABS
650.0000 mg | ORAL_TABLET | ORAL | Status: DC | PRN
  Filled 2021-07-11: qty 2

## 2021-07-11 MED ORDER — SUGAMMADEX SODIUM 200 MG/2ML IV SOLN
INTRAVENOUS | Status: DC | PRN
  Administered 2021-07-11: 200 mg via INTRAVENOUS

## 2021-07-11 MED ORDER — SODIUM CHLORIDE 0.9% FLUSH: 3.0000 mL | Freq: Two times a day (BID) | INTRAVENOUS | Status: DC

## 2021-07-11 MED ORDER — DEXAMETHASONE SODIUM PHOSPHATE 10 MG/ML IJ SOLN
INTRAMUSCULAR | Status: DC | PRN
  Administered 2021-07-11: 5 mg via INTRAVENOUS

## 2021-07-11 MED ORDER — PROPOFOL 10 MG/ML IV BOLUS
INTRAVENOUS | Status: DC | PRN
  Administered 2021-07-11: 80 mg via INTRAVENOUS

## 2021-07-11 MED ORDER — ONDANSETRON HCL 4 MG/2ML IJ SOLN
INTRAMUSCULAR | Status: DC | PRN
  Administered 2021-07-11: 4 mg via INTRAVENOUS

## 2021-07-11 MED ORDER — LIDOCAINE 2% (20 MG/ML) 5 ML SYRINGE
INTRAMUSCULAR | Status: DC | PRN
  Administered 2021-07-11: 60 mg via INTRAVENOUS

## 2021-07-11 MED ORDER — SUGAMMADEX SODIUM 200 MG/2ML IV SOLN: INTRAVENOUS | Status: DC | PRN

## 2021-07-11 MED ORDER — ONDANSETRON HCL 4 MG/2ML IJ SOLN: 4.0000 mg | Freq: Four times a day (QID) | INTRAMUSCULAR | Status: DC | PRN

## 2021-07-11 MED ORDER — FENTANYL CITRATE (PF) 250 MCG/5ML IJ SOLN
INTRAMUSCULAR | Status: DC | PRN
  Administered 2021-07-11: 50 ug via INTRAVENOUS

## 2021-07-11 SURGICAL SUPPLY — 12 items
BLANKET WARM UNDERBOD FULL ACC (MISCELLANEOUS) ×2 IMPLANT
CATH EZ STEER NAV 8MM F-J CUR (ABLATOR) ×2 IMPLANT
CATH WEB BI DIR CSDF CRV REPRO (CATHETERS) ×2 IMPLANT
CLOSURE PERCLOSE PROSTYLE (VASCULAR PRODUCTS) ×4 IMPLANT
MAT PREVALON FULL STRYKER (MISCELLANEOUS) ×2 IMPLANT
PACK EP LATEX FREE (CUSTOM PROCEDURE TRAY) ×2
PACK EP LF (CUSTOM PROCEDURE TRAY) ×1 IMPLANT
PAD PRO RADIOLUCENT 2001M-C (PAD) ×2 IMPLANT
PATCH CARTO3 (PAD) ×2 IMPLANT
SHEATH PINNACLE 7F 10CM (SHEATH) ×2 IMPLANT
SHEATH PINNACLE 8F 10CM (SHEATH) ×2 IMPLANT
SHEATH PROBE COVER 6X72 (BAG) ×2 IMPLANT

## 2021-07-11 NOTE — Anesthesia Procedure Notes (Signed)
Procedure Name: Intubation Date/Time: 07/11/2021 1:33 PM Performed by: Rande Brunt, CRNA Pre-anesthesia Checklist: Patient identified, Emergency Drugs available, Suction available and Patient being monitored Patient Re-evaluated:Patient Re-evaluated prior to induction Oxygen Delivery Method: Circle System Utilized Preoxygenation: Pre-oxygenation with 100% oxygen Induction Type: IV induction Ventilation: Mask ventilation without difficulty Laryngoscope Size: Glidescope and 4 Grade View: Grade I Tube type: Oral Tube size: 7.5 mm Number of attempts: 1 Airway Equipment and Method: Stylet and Oral airway Placement Confirmation: ETT inserted through vocal cords under direct vision, positive ETCO2 and breath sounds checked- equal and bilateral Secured at: 22 cm Tube secured with: Tape Dental Injury: Teeth and Oropharynx as per pre-operative assessment  Comments: DL x1 with Miller 2, grade 3 view due to stiff neck. Sharyn Dross, CRNA

## 2021-07-11 NOTE — H&P (Signed)
Electrophysiology Office Note   Date:  07/11/2021   ID:  Keith Jordan, DOB 11-02-1941, MRN [id number]  PCP:  [name], MD  Cardiologist:  Tobb Primary Electrophysiologist:  Roshunda Keir Meredith Leeds, MD    Chief Complaint: atrial flutter   History of Present Illness: Keith Jordan is a 80 y.o. male who is being seen today for the evaluation of atrial flutter at the request of No ref. provider found. Presenting today for electrophysiology evaluation.  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  He had been having intermittent palpitations.  He was started on Eliquis and Cardizem.  Today, denies symptoms of palpitations, chest pain, shortness of breath, orthopnea, PND, lower extremity edema, claudication, dizziness, presyncope, syncope, bleeding, or neurologic sequela. The patient is tolerating medications without difficulties. Plan ablation.    Past Medical History:  Diagnosis Date   Abnormal breath sounds 10/11/2020   Acute sinusitis 01/29/2021   Advanced care planning/counseling discussion 09/01/2016   BPH (benign prostatic hyperplasia) 09/29/2018   Ear fullness, bilateral 04/03/2021   Health maintenance examination 09/01/2016   Hyperlipidemia    Irregular heart beat 03/24/2021   Ischemic optic neuropathy 2006   [facility] at [facility]   Leukocytosis 01/30/2021   Severe s/p ER eval 01/2021   Medicare annual wellness visit, subsequent 10/04/2019   Nausea without vomiting 03/24/2021   Personal history of colonic adenoma 07/30/2008   Prediabetes 07/05/2016   A1c 6.5% 03/2014    Prediabetes 07/05/2016   A1c 6.5% 03/2014    Primary osteoarthritis of knee    bilateral s/p L knee replacement, receives R knee injections [name], [name])   Sebaceous cyst 01/30/2020   Splenic marginal zone b-cell lymphoma ([place]) 02/05/2021   Past Surgical History:  Procedure Laterality Date   COLONOSCOPY  12/2013   no polyps,  no rpt due [name])   CYST EXCISION  01/2020   TOTAL KNEE ARTHROPLASTY Left 2005   [name]     Current Facility-Administered Medications  Medication Dose Route Frequency Provider Last Rate Last Admin   0.9 %  sodium chloride infusion   Intravenous Continuous [name], MD 50 mL/hr at 07/11/21 1220 New Bag at 07/11/21 1220    Allergies:   Patient has no known allergies.   Social History:  The patient  reports that he has never smoked. He has never used smokeless tobacco. He reports current alcohol use. He reports that he does not use drugs.   Family History:  The patient's family history includes Arthritis in his father; Blindness (age of onset: 73) in his mother; Lung disease in his father.   ROS:  Please see the history of present illness.   Otherwise, review of systems is positive for none.   All other systems are reviewed and negative.   PHYSICAL EXAM: VS:  BP (!) 165/75   Pulse 72   Temp 98.8 F (37.1 C) (Oral)   Resp 18   Ht 5\' 9"  (1.753 m)   Wt 69.9 kg   SpO2 100%   BMI 22.74 kg/m  , BMI Body mass index is 22.74 kg/m. GEN: Well nourished, well developed, in no acute distress  HEENT: normal  Neck: no JVD, carotid bruits, or masses Cardiac: RRR; no murmurs, rubs, or gallops,no edema  Respiratory:  clear to auscultation bilaterally, normal work of breathing GI: soft, nontender, nondistended, + BS MS: no deformity or atrophy  Skin: warm and dry Neuro:  Strength  and sensation are intact Psych: euthymic mood, full affect  Recent Labs: 01/30/2021: TSH 1.969 05/30/2021: ALT 10 07/04/2021: BUN 18; Creatinine, Ser 1.26; Hemoglobin 13.4; Platelets 135; Potassium 4.1; Sodium 140    Lipid Panel     Component Value Date/Time   CHOL 120 10/03/2020 1115   CHOL 140 04/16/2015 0000   CHOL 140 04/16/2015 0000   TRIG 106.0 10/03/2020 1115   TRIG 94 04/16/2015 0000   TRIG 94 04/16/2015 0000   HDL 27.90 (L) 10/03/2020 1115   CHOLHDL 4 10/03/2020 1115   VLDL 21.2  10/03/2020 1115   LDLCALC 71 10/03/2020 1115   LDLCALC 86 04/16/2015 0000   LDLCALC 86 04/16/2015 0000     Wt Readings from Last 3 Encounters:  07/11/21 69.9 kg  05/30/21 69.2 kg  05/29/21 69 kg      Other studies Reviewed: Additional studies/ records that were reviewed today include: TTE 05/21/21  Review of the above records today demonstrates:   1. Left ventricular ejection fraction, by estimation, is 70 to 75%. The  left ventricle has hyperdynamic function. The left ventricle has no  regional wall motion abnormalities. Left ventricular diastolic parameters  are consistent with Grade I diastolic  dysfunction (impaired relaxation).   2. Right ventricular systolic function is normal. The right ventricular  size is mildly enlarged. There is mildly elevated pulmonary artery  systolic pressure. The estimated right ventricular systolic pressure is  09.9 mmHg.   3. The mitral valve is normal in structure. No evidence of mitral valve  regurgitation. No evidence of mitral stenosis.   4. The aortic valve is normal in structure. Aortic valve regurgitation is  not visualized. No aortic stenosis is present.   5. The inferior vena cava is normal in size with greater than 50%  respiratory variability, suggesting right atrial pressure of 3 mmHg.   Cardiac monitor 05/12/2020 personally reviewed 1.  Symptomatic atrial flutter 2.  Occasional premature atrial complexes.  ASSESSMENT AND PLAN:  1.  Typical atrial flutter: Keith Jordan has presented today for surgery, with the diagnosis of atrial flutter.  The various methods of treatment have been discussed with the patient and family. After consideration of risks, benefits and other options for treatment, the patient has consented to  Procedure(s): Catheter ablation as a surgical intervention .  Risks include but not limited to complete heart block, stroke, esophageal damage, nerve damage, bleeding, vascular damage, tamponade, perforation, MI,  and death. The patient's history has been reviewed, patient examined, no change in status, stable for surgery.  I have reviewed the patient's chart and labs.  Questions were answered to the patient's satisfaction.    Keith Bedoya Curt Bears, MD 07/11/2021 12:59 PM

## 2021-07-11 NOTE — Anesthesia Preprocedure Evaluation (Addendum)
Anesthesia Evaluation  Patient identified by MRN, date of birth, ID band Patient awake    Reviewed: Allergy & Precautions, NPO status , Patient's Chart, lab work & pertinent test results  Airway Mallampati: II  TM Distance: >3 FB Neck ROM: Full    Dental  (+) Teeth Intact, Dental Advisory Given   Pulmonary neg pulmonary ROS,    Pulmonary exam normal breath sounds clear to auscultation       Cardiovascular Normal cardiovascular exam+ dysrhythmias (on eliquis, no missed doses) Atrial Fibrillation  Rhythm:Irregular Rate:Normal  HLD  TTE 2022 1. Left ventricular ejection fraction, by estimation, is 70 to 75%. The  left ventricle has hyperdynamic function. The left ventricle has no  regional wall motion abnormalities. Left ventricular diastolic parameters  are consistent with Grade I diastolic  dysfunction (impaired relaxation).  2. Right ventricular systolic function is normal. The right ventricular  size is mildly enlarged. There is mildly elevated pulmonary artery  systolic pressure. The estimated right ventricular systolic pressure is  28.7 mmHg.  3. The mitral valve is normal in structure. No evidence of mitral valve  regurgitation. No evidence of mitral stenosis.  4. The aortic valve is normal in structure. Aortic valve regurgitation is  not visualized. No aortic stenosis is present.  5. The inferior vena cava is normal in size with greater than 50%  respiratory variability, suggesting right atrial pressure of 3 mmHg.    Neuro/Psych negative neurological ROS  negative psych ROS   GI/Hepatic negative GI ROS, Neg liver ROS,   Endo/Other    Renal/GU negative Renal ROS  negative genitourinary   Musculoskeletal  (+) Arthritis ,   Abdominal   Peds  Hematology negative hematology ROS (+) Splenic marginal zone b-cell lymphoma    Anesthesia Other Findings   Reproductive/Obstetrics                             Anesthesia Physical Anesthesia Plan  ASA: 2  Anesthesia Plan: General   Post-op Pain Management:    Induction: Intravenous  PONV Risk Score and Plan: 2 and Dexamethasone, Ondansetron and Treatment may vary due to age or medical condition  Airway Management Planned: Oral ETT  Additional Equipment:   Intra-op Plan:   Post-operative Plan: Extubation in OR  Informed Consent: I have reviewed the patients History and Physical, chart, labs and discussed the procedure including the risks, benefits and alternatives for the proposed anesthesia with the patient or authorized representative who has indicated his/her understanding and acceptance.     Dental advisory given  Plan Discussed with: CRNA  Anesthesia Plan Comments:        Anesthesia Quick Evaluation

## 2021-07-11 NOTE — Anesthesia Postprocedure Evaluation (Signed)
Anesthesia Post Note  Patient: Keith Jordan  Procedure(s) Performed: A-FLUTTER ABLATION     Patient location during evaluation: PACU Anesthesia Type: General Level of consciousness: awake and alert Pain management: pain level controlled Vital Signs Assessment: post-procedure vital signs reviewed and stable Respiratory status: spontaneous breathing, nonlabored ventilation, respiratory function stable and patient connected to nasal cannula oxygen Cardiovascular status: blood pressure returned to baseline and stable Postop Assessment: no apparent nausea or vomiting Anesthetic complications: no   There were no known notable events for this encounter.  Last Vitals:  Vitals:   07/11/21 1625 07/11/21 1630  BP:  (!) 130/45  Pulse: 76 89  Resp: 17 16  Temp:    SpO2: 94% 99%    Last Pain:  Vitals:   07/11/21 1526  TempSrc:   PainSc: 0-No pain                 Heddy Vidana L Myiah Petkus

## 2021-07-11 NOTE — Discharge Instructions (Signed)

## 2021-07-11 NOTE — Transfer of Care (Signed)
Immediate Anesthesia Transfer of Care Note  Patient: INGRAM ONNEN  Procedure(s) Performed: A-FLUTTER ABLATION  Patient Location: Cath Lab  Anesthesia Type:General  Level of Consciousness: awake, alert  and patient cooperative  Airway & Oxygen Therapy: Patient Spontanous Breathing and Patient connected to nasal cannula oxygen  Post-op Assessment: Report given to RN, Post -op Vital signs reviewed and stable and Patient moving all extremities X 4  Post vital signs: Reviewed and stable  Last Vitals:  Vitals Value Taken Time  BP    Temp    Pulse 62 07/11/21 1442  Resp 20 07/11/21 1442  SpO2 98 % 07/11/21 1442  Vitals shown include unvalidated device data.  Last Pain:  Vitals:   07/11/21 1210  TempSrc:   PainSc: 0-No pain         Complications: There were no known notable events for this encounter.

## 2021-07-14 ENCOUNTER — Ambulatory Visit (HOSPITAL_COMMUNITY)
Admission: EM | Admit: 2021-07-14 | Discharge: 2021-07-14 | Disposition: A | Discharge status: Home / Self Care | Payer: Medicare HMO | Attending: Emergency Medicine | Admitting: Emergency Medicine

## 2021-07-14 ENCOUNTER — Encounter (HOSPITAL_COMMUNITY): Payer: Self-pay | Admitting: Cardiology

## 2021-07-14 ENCOUNTER — Other Ambulatory Visit: Payer: Self-pay

## 2021-07-14 DIAGNOSIS — U071 COVID-19: Secondary | ICD-10-CM | POA: Insufficient documentation

## 2021-07-14 DIAGNOSIS — Z7901 Long term (current) use of anticoagulants: Secondary | ICD-10-CM | POA: Diagnosis not present

## 2021-07-14 DIAGNOSIS — J069 Acute upper respiratory infection, unspecified: Secondary | ICD-10-CM

## 2021-07-14 DIAGNOSIS — Z79899 Other long term (current) drug therapy: Secondary | ICD-10-CM | POA: Insufficient documentation

## 2021-07-14 DIAGNOSIS — J029 Acute pharyngitis, unspecified: Secondary | ICD-10-CM | POA: Diagnosis present

## 2021-07-14 DIAGNOSIS — R0981 Nasal congestion: Secondary | ICD-10-CM | POA: Diagnosis present

## 2021-07-14 NOTE — ED Triage Notes (Signed)
Patient c/o mucus, sinus congestion and headache since Saturday. Tried tylenol with some relief.

## 2021-07-14 NOTE — Discharge Instructions (Addendum)
We will contact you if your COVID test is positive.  Please quarantine while you wait for the results.  If your test is negative you may resume normal activities.  If your test is positive please continue to quarantine for at least 5 days from your symptom onset or until you are without a fever for at least 24 hours after the medications.  You can take Tylenol and/or Ibuprofen as needed for fever reduction and pain relief.  For congestion: take a daily anti-histamine like Zyrtec, Claritin, and a oral decongestant, such as pseudoephedrine.  You can also use Flonase 1-2 sprays in each nostril daily.   For cough: honey 1/2 to 1 teaspoon (you can dilute the honey in water or another fluid).  You can also use guaifenesin and dextromethorphan for cough. You can use a humidifier for chest congestion and cough.  If you don't have a humidifier, you can sit in the bathroom with the hot shower running.     For sore throat: try warm salt water gargles, cepacol lozenges, throat spray, warm tea or water with lemon/honey, popsicles or ice, or OTC cold relief medicine for throat discomfort.    It is important to stay hydrated: drink plenty of fluids (water, gatorade/powerade/pedialyte, juices, or teas) to keep your throat moisturized and help further relieve irritation/discomfort.   Return or go to the Emergency Department if symptoms worsen or do not improve in the next few days.

## 2021-07-14 NOTE — ED Provider Notes (Signed)
Keith Jordan    CSN: [id number] Arrival date & time: 07/14/21  0814      History   Chief Complaint Chief Complaint  Patient presents with   Nasal Congestion    HPI Keith Jordan is a 80 y.o. male.   Patient here for evaluation of congestion, sore throat, and headache that started late Saturday/early Sunday.  Denies any recent sick contacts but does state that he was recently in the hospital for a cardiac procedure.  Reports taking tylenol cold and sinus which helped with sinus congestion but "made him hot."  Denies any trauma, injury, or other precipitating event.  Denies any specific alleviating or aggravating factors.  Denies any fevers, chest pain, shortness of breath, N/V/D, numbness, tingling, weakness, abdominal pain, or headaches.    The history is provided by the patient.   Past Medical History:  Diagnosis Date   Abnormal breath sounds 10/11/2020   Acute sinusitis 01/29/2021   Advanced care planning/counseling discussion 09/01/2016   BPH (benign prostatic hyperplasia) 09/29/2018   Ear fullness, bilateral 04/03/2021   Health maintenance examination 09/01/2016   Hyperlipidemia    Irregular heart beat 03/24/2021   Ischemic optic neuropathy 2006   [facility] at [facility]   Leukocytosis 01/30/2021   Severe s/p ER eval 01/2021   Medicare annual wellness visit, subsequent 10/04/2019   Nausea without vomiting 03/24/2021   Personal history of colonic adenoma 07/30/2008   Prediabetes 07/05/2016   A1c 6.5% 03/2014    Prediabetes 07/05/2016   A1c 6.5% 03/2014    Primary osteoarthritis of knee    bilateral s/p L knee replacement, receives R knee injections [name], [name])   Sebaceous cyst 01/30/2020   Splenic marginal zone b-cell lymphoma (Keith Jordan) 02/05/2021    Patient Active Problem List   Diagnosis Date Noted   Ear fullness, bilateral 04/03/2021   Nausea without vomiting 03/24/2021   Irregular heart beat 03/24/2021   Splenic marginal zone b-cell lymphoma (Keith Jordan) 02/05/2021    Leukocytosis 01/30/2021   Acute sinusitis 01/29/2021   Abnormal breath sounds 10/11/2020   Sebaceous cyst 01/30/2020   Medicare annual wellness visit, subsequent 10/04/2019   BPH (benign prostatic hyperplasia) 09/29/2018   Health maintenance examination 09/01/2016   Advanced care planning/counseling discussion 09/01/2016   Prediabetes 07/05/2016   Prediabetes 07/05/2016   Hyperlipidemia    Primary osteoarthritis of knee    Ischemic optic neuropathy    Personal history of colonic adenoma 07/30/2008    Past Surgical History:  Procedure Laterality Date   A-FLUTTER ABLATION N/A 07/11/2021   Procedure: A-FLUTTER ABLATION;  Surgeon: [name], MD;  Location: [facility];  Service: Cardiovascular;  Laterality: N/A;   COLONOSCOPY  12/2013   no polyps, no rpt due [name])   CYST EXCISION  01/2020   TOTAL KNEE ARTHROPLASTY Left 2005   [name]       Home Medications    Prior to Admission medications   Medication Sig Start Date End Date Taking? Authorizing Provider  acetaminophen (TYLENOL) 500 MG tablet Take 500 mg by mouth every 6 (six) hours as needed for mild pain.    [provider]  allopurinol (ZYLOPRIM) 300 MG tablet TAKE 1 TABLET BY MOUTH EVERY DAY Patient not taking: Reported on 07/07/2021 03/19/21   Dede Query T, PA-C  apixaban (ELIQUIS) 5 MG TABS tablet Take 1 tablet (5 mg total) by mouth 2 (two) times daily. 04/18/21   Richardo Priest, MD  Ascorbic Acid (VITAMIN C) 1000 MG tablet Take  500 mg by mouth 2 (two) times daily.    [provider]  atorvastatin (LIPITOR) 10 MG tablet Take 1 tablet (10 mg total) by mouth daily. 10/11/20   [name], MD  carboxymethylcellulose (REFRESH PLUS) 0.5 % SOLN Place 1 drop into both eyes 2 (two) times daily as needed (dry eyes).    [provider]  diltiazem (CARDIZEM CD) 120 MG 24 hr capsule Take 1 capsule (120 mg total) by mouth daily. 05/21/21 08/19/21  Tobb, Godfrey Pick, DO  Multiple  Vitamins-Minerals (MULTIVITAMIN WITH MINERALS) tablet Take 1 tablet by mouth daily.    [provider]  Multiple Vitamins-Minerals (PRESERVISION AREDS) CAPS Take 1 capsule by mouth in the morning and at bedtime. 10/11/20   [name], MD  ondansetron (ZOFRAN) 8 MG tablet Take 1 tablet (8 mg total) by mouth every 8 (eight) hours as needed for nausea or vomiting. 03/24/21   Dede Query T, PA-C  sodium chloride (OCEAN) 0.65 % SOLN nasal spray Place 1 spray into both nostrils as needed for congestion.    [provider]    Family History Family History  Problem Relation Age of Onset   Blindness Mother 15       ?stroke in eyes   Lung disease Father        coal miner   Arthritis Father    Colon cancer Neg Hx    Pancreatic cancer Neg Hx    Stomach cancer Neg Hx    Esophageal cancer Neg Hx    Rectal cancer Neg Hx    Cancer Neg Hx    Diabetes Neg Hx    CAD Neg Hx    Stroke Neg Hx     Social History Social History   Tobacco Use   Smoking status: Never   Smokeless tobacco: Never  Vaping Use   Vaping Use: Never used  Substance Use Topics   Alcohol use: Yes    Comment: occasional   Drug use: No     Allergies   Patient has no known allergies.   Review of Systems Review of Systems  Constitutional:  Negative for fatigue and fever.  HENT:  Positive for congestion, sinus pressure and sore throat.   Respiratory:  Negative for stridor.   Cardiovascular:  Negative for chest pain.  Neurological:  Positive for headaches. Negative for light-headedness and numbness.  All other systems reviewed and are negative.   Physical Exam Triage Vital Signs ED Triage Vitals [07/14/21 0836]  Enc Vitals Group     BP 131/66     Pulse Rate 88     Resp 18     Temp (!) 100.4 F (38 C)     Temp Source Oral     SpO2 95 %     Weight      Height      Head Circumference      Peak Flow      Pain Score 0     Pain Loc      Pain Edu?      Excl. in [place]?    No data  found.  Updated Vital Signs BP 131/66 (BP Location: Right Arm)   Pulse 88   Temp (!) 100.4 F (38 C) (Oral)   Resp 18   SpO2 95%   Visual Acuity Right Eye Distance:   Left Eye Distance:   Bilateral Distance:    Right Eye Near:   Left Eye Near:    Bilateral Near:     Physical  Exam Vitals and nursing note reviewed.  Constitutional:      General: He is not in acute distress.    Appearance: Normal appearance. He is not ill-appearing, toxic-appearing or diaphoretic.  HENT:     Head: Normocephalic and atraumatic.     Nose: Congestion present.     Mouth/Throat:     Pharynx: Posterior oropharyngeal erythema present. No oropharyngeal exudate.     Tonsils: No tonsillar exudate or tonsillar abscesses. 1+ on the right. 1+ on the left.  Eyes:     Conjunctiva/sclera: Conjunctivae normal.  Cardiovascular:     Rate and Rhythm: Normal rate.     Pulses: Normal pulses.  Pulmonary:     Effort: Pulmonary effort is normal.     Breath sounds: Normal breath sounds.  Abdominal:     General: Abdomen is flat.  Musculoskeletal:        General: Normal range of motion.     Cervical back: Normal range of motion.  Skin:    General: Skin is warm and dry.  Neurological:     General: No focal deficit present.     Mental Status: He is alert and oriented to person, place, and time.  Psychiatric:        Mood and Affect: Mood normal.     UC Treatments / Results  Labs (all labs ordered are listed, but only abnormal results are displayed) Labs Reviewed  SARS CORONAVIRUS 2 (TAT 6-24 HRS)    EKG   Radiology No results found.  Procedures Procedures (including critical care time)  Medications Ordered in UC Medications - No data to display  Initial Impression / Assessment and Plan / UC Course  I have reviewed the triage vital signs and the nursing notes.  Pertinent labs & imaging results that were available during my care of the patient were reviewed by me and considered in my medical  decision making (see chart for details).    Assessment negative for red flag or concerns.  COVID test pending.  Discussed need to quarantine while waiting on results and for at least 5 days if result is positive.  May take Tylenol and/or Ibuprofen as needed for pain and fever. May continue OTC medications, including Tylenol cold and sinus, for symptom relief.  Discussed conservative symptom management as described in discharge instructions.  Follow up with primary care.   Final Clinical Impressions(s) / UC Diagnoses   Final diagnoses:  Viral URI     Discharge Instructions      We will contact you if your COVID test is positive.  Please quarantine while you wait for the results.  If your test is negative you may resume normal activities.  If your test is positive please continue to quarantine for at least 5 days from your symptom onset or until you are without a fever for at least 24 hours after the medications.  You can take Tylenol and/or Ibuprofen as needed for fever reduction and pain relief.  For congestion: take a daily anti-histamine like Zyrtec, Claritin, and a oral decongestant, such as pseudoephedrine.  You can also use Flonase 1-2 sprays in each nostril daily.   For cough: honey 1/2 to 1 teaspoon (you can dilute the honey in water or another fluid).  You can also use guaifenesin and dextromethorphan for cough. You can use a humidifier for chest congestion and cough.  If you don't have a humidifier, you can sit in the bathroom with the hot shower running.     For sore  throat: try warm salt water gargles, cepacol lozenges, throat spray, warm tea or water with lemon/honey, popsicles or ice, or OTC cold relief medicine for throat discomfort.    It is important to stay hydrated: drink plenty of fluids (water, gatorade/powerade/pedialyte, juices, or teas) to keep your throat moisturized and help further relieve irritation/discomfort.   Return or go to the Emergency Department if symptoms  worsen or do not improve in the next few days.      ED Prescriptions   None    PDMP not reviewed this encounter.   Pearson Forster, NP 07/14/21 [phone]

## 2021-07-15 LAB — SARS CORONAVIRUS 2 (TAT 6-24 HRS): SARS Coronavirus 2: POSITIVE — AB

## 2021-07-21 ENCOUNTER — Telehealth: Payer: Self-pay | Admitting: Family Medicine

## 2021-07-21 NOTE — Chronic Care Management (AMB) (Signed)
  Chronic Care Management   Note  07/21/2021 Name: Keith Jordan MRN: [id number] DOB: Nov 03, 1941  Keith Jordan is a 80 y.o. year old male who is a primary care patient of [name], MD. I reached out to Golden Pop by phone today in response to a referral sent by Keith Jordan PCP, [name], MD.   Keith Jordan was given information about Chronic Care Management services today including:  CCM service includes personalized support from designated clinical staff supervised by his physician, including individualized plan of care and coordination with other care providers 24/7 contact phone numbers for assistance for urgent and routine care needs. Service will only be billed when office clinical staff spend 20 minutes or more in a month to coordinate care. Only one practitioner may furnish and bill the service in a calendar month. The patient may stop CCM services at any time (effective at the end of the month) by phone call to the office staff.   Patient agreed to services and verbal consent obtained.   Follow up plan:   Tatjana Secretary/administrator

## 2021-07-28 ENCOUNTER — Encounter: Payer: Self-pay | Admitting: Hematology and Oncology

## 2021-08-01 ENCOUNTER — Other Ambulatory Visit: Payer: Self-pay

## 2021-08-01 ENCOUNTER — Ambulatory Visit: Payer: Medicare HMO | Admitting: Cardiology

## 2021-08-01 ENCOUNTER — Encounter: Payer: Self-pay | Admitting: Cardiology

## 2021-08-01 VITALS — BP 116/80 | HR 70 | Ht 70.0 in | Wt 153.0 lb

## 2021-08-01 DIAGNOSIS — I483 Typical atrial flutter: Secondary | ICD-10-CM | POA: Diagnosis not present

## 2021-08-01 NOTE — Progress Notes (Signed)
Cardiology Office Note:    Date:  08/01/2021   ID:  Keith Jordan, DOB February 25, 1941, MRN [id number]  PCP:  [name], MD  Cardiologist:  [name], DO  Electrophysiologist:  None   Referring MD: [name], MD   Chief Complaint  Patient presents with   Follow-up    History of Present Illness:    Keith Jordan is a 80 y.o. 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   I saw the patient on 04/17/2021 and at that time he reported intermittent palpitations, therefore a zio live monitor was placed on the patient. Two days of wearing the monitor, atrial flutter was noted and we started the patient on anticoagulation with Eliquis ( CHADS2 VASc score of 2). He was also started on Cardizem 120 mg daily.  At his last visit we discussed his ZIO monitor result which showed evidence of atrial flutter.  He has since seen EP and had underwent atrial flutter ablation.  He is really happy post ablation.  Specter of more in his fall.  Final complaints at this time.  Past Medical History:  Diagnosis Date   Abnormal breath sounds 10/11/2020   Acute sinusitis 01/29/2021   Advanced care planning/counseling discussion 09/01/2016   BPH (benign prostatic hyperplasia) 09/29/2018   Ear fullness, bilateral 04/03/2021   Health maintenance examination 09/01/2016   Hyperlipidemia    Irregular heart beat 03/24/2021   Ischemic optic neuropathy 2006   [place] [facility]   Leukocytosis 01/30/2021   Severe s/p ER eval 01/2021   Medicare annual wellness visit, subsequent 10/04/2019   Nausea without vomiting 03/24/2021   Personal history of colonic adenoma 07/30/2008   Prediabetes 07/05/2016   A1c 6.5% 03/2014    Prediabetes 07/05/2016   A1c 6.5% 03/2014    Primary osteoarthritis of knee    bilateral s/p L knee replacement, receives R knee injections [name], [name])   Sebaceous cyst  01/30/2020   Splenic marginal zone b-cell lymphoma ([place]) 02/05/2021    Past Surgical History:  Procedure Laterality Date   A-FLUTTER ABLATION N/A 07/11/2021   Procedure: A-FLUTTER ABLATION;  Surgeon: [name], MD;  Location: [facility];  Service: Cardiovascular;  Laterality: N/A;   COLONOSCOPY  12/2013   no polyps, no rpt due [name])   CYST EXCISION  01/2020   TOTAL KNEE ARTHROPLASTY Left 2005   [name]    Current Medications: Current Meds  Medication Sig   acetaminophen (TYLENOL) 500 MG tablet Take 500 mg by mouth every 6 (six) hours as needed for mild pain.   allopurinol (ZYLOPRIM) 300 MG tablet TAKE 1 TABLET BY MOUTH EVERY DAY (Patient taking differently: Take 300 mg by mouth daily.)   apixaban (ELIQUIS) 5 MG TABS tablet Take 1 tablet (5 mg total) by mouth 2 (two) times daily.   Ascorbic Acid (VITAMIN C) 1000 MG tablet Take 500 mg by mouth 2 (two) times daily.   atorvastatin (LIPITOR) 10 MG tablet Take 1 tablet (10 mg total) by mouth daily.   carboxymethylcellulose (REFRESH PLUS) 0.5 % SOLN Place 1 drop into both eyes 2 (two) times daily as needed (dry eyes).   diltiazem (CARDIZEM CD) 120 MG 24 hr capsule Take 1 capsule (120 mg total) by mouth daily.   Multiple Vitamins-Minerals (MULTIVITAMIN WITH MINERALS) tablet Take 1 tablet by mouth daily. Unknown strenght   Multiple Vitamins-Minerals (PRESERVISION AREDS) CAPS Take 1 capsule  by mouth in the morning and at bedtime. (Patient taking differently: Take 1 capsule by mouth in the morning and at bedtime. Unknown strenght)   ondansetron (ZOFRAN) 8 MG tablet Take 1 tablet (8 mg total) by mouth every 8 (eight) hours as needed for nausea or vomiting.   sodium chloride (OCEAN) 0.65 % SOLN nasal spray Place 1 spray into both nostrils as needed for congestion.     Allergies:   Patient has no known allergies.   Social History   Socioeconomic History   Marital status: Married    Spouse name: Not on file   Number of  children: Not on file   Years of education: Not on file   Highest education level: Not on file  Occupational History   Not on file  Tobacco Use   Smoking status: Never   Smokeless tobacco: Never  Vaping Use   Vaping Use: Never used  Substance and Sexual Activity   Alcohol use: Yes    Comment: occasional   Drug use: No   Sexual activity: Not Currently  Other Topics Concern   Not on file  Social History Narrative   Lives with wife, 2 cats   Occ: retired Personal assistant   Activity: works in yard   Diet: some water, fruits/vegetables daily   Social Determinants of Radio broadcast assistant Strain: Low Risk    Difficulty of Paying Living Expenses: Not hard at all  Food Insecurity: No Food Insecurity   Worried About Charity fundraiser in the Last Year: Never true   Arboriculturist in the Last Year: Never true  Transportation Needs: No Transportation Needs   Lack of Transportation (Medical): No   Lack of Transportation (Non-Medical): No  Physical Activity: Inactive   Days of Exercise per Week: 0 days   Minutes of Exercise per Session: 0 min  Stress: No Stress Concern Present   Feeling of Stress : Not at all  Social Connections: Not on file     Family History: The patient's family history includes Arthritis in his father; Blindness (age of onset: 80) in his mother; Lung disease in his father. There is no history of Colon cancer, Pancreatic cancer, Stomach cancer, Esophageal cancer, Rectal cancer, Cancer, Diabetes, CAD, or Stroke.  ROS:   Review of Systems  Constitution: Negative for decreased appetite, fever and weight gain.  HENT: Negative for congestion, ear discharge, hoarse voice and sore throat.   Eyes: Negative for discharge, redness, vision loss in right eye and visual halos.  Cardiovascular: Negative for chest pain, dyspnea on exertion, leg swelling, orthopnea and palpitations.  Respiratory: Negative for cough, hemoptysis, shortness of breath and snoring.   Endocrine:  Negative for heat intolerance and polyphagia.  Hematologic/Lymphatic: Negative for bleeding problem. Does not bruise/bleed easily.  Skin: Negative for flushing, nail changes, rash and suspicious lesions.  Musculoskeletal: Negative for arthritis, joint pain, muscle cramps, myalgias, neck pain and stiffness.  Gastrointestinal: Negative for abdominal pain, bowel incontinence, diarrhea and excessive appetite.  Genitourinary: Negative for decreased libido, genital sores and incomplete emptying.  Neurological: Negative for brief paralysis, focal weakness, headaches and loss of balance.  Psychiatric/Behavioral: Negative for altered mental status, depression and suicidal ideas.  Allergic/Immunologic: Negative for HIV exposure and persistent infections.    EKGs/Labs/Other Studies Reviewed:    The following studies were reviewed today:   EKG: None today  TTE 2022 IMPRESSIONS  1. Left ventricular ejection fraction, by estimation, is 70 to 75%. The  left ventricle has  hyperdynamic function. The left ventricle has no  regional wall motion abnormalities. Left ventricular diastolic parameters  are consistent with Grade I diastolic  dysfunction (impaired relaxation).   2. Right ventricular systolic function is normal. The right ventricular  size is mildly enlarged. There is mildly elevated pulmonary artery  systolic pressure. The estimated right ventricular systolic pressure is  [id number] mmHg.   3. The mitral valve is normal in structure. No evidence of mitral valve  regurgitation. No evidence of mitral stenosis.   4. The aortic valve is normal in structure. Aortic valve regurgitation is  not visualized. No aortic stenosis is present.   5. The inferior vena cava is normal in size with greater than 50%  respiratory variability, suggesting right atrial pressure of 3 mmHg.   FINDINGS   Left Ventricle: Left ventricular ejection fraction, by estimation, is 70  to 75%. The left ventricle has hyperdynamic  function. The left ventricle  has no regional wall motion abnormalities. The left ventricular internal  cavity size was normal in size.  There is no left ventricular hypertrophy. Left ventricular diastolic  parameters are consistent with Grade I diastolic dysfunction (impaired  relaxation).   Right Ventricle: The right ventricular size is mildly enlarged. No  increase in right ventricular wall thickness. Right ventricular systolic  function is normal. There is mildly elevated pulmonary artery systolic  pressure. The tricuspid regurgitant  velocity is 2.89 m/s, and with an assumed right atrial pressure of 3 mmHg,  the estimated right ventricular systolic pressure is [id number] mmHg.   Left Atrium: Left atrial size was normal in size.   Right Atrium: Right atrial size was normal in size.   Pericardium: There is no evidence of pericardial effusion.   Mitral Valve: The mitral valve is normal in structure. No evidence of  mitral valve regurgitation. No evidence of mitral valve stenosis.   Tricuspid Valve: The tricuspid valve is normal in structure. Tricuspid  valve regurgitation is mild . No evidence of tricuspid stenosis.   Aortic Valve: The aortic valve is normal in structure. Aortic valve  regurgitation is not visualized. No aortic stenosis is present.   Pulmonic Valve: The pulmonic valve was normal in structure. Pulmonic valve  regurgitation is not visualized. No evidence of pulmonic stenosis.   Aorta: The aortic root is normal in size and structure.   Venous: The inferior vena cava is normal in size with greater than 50%  respiratory variability, suggesting right atrial pressure of 3 mmHg.   IAS/Shunts: No atrial level shunt detected by color flow Doppler.       Zio monitor The patient wore the monitor for 13 days, 21 hours starting April 17, 2021.   Indication: Palpitations   The minimum heart rate was 46 bpm, maximum heart rate was 149 bpm, and average heart rate was 68  bpm. Predominant underlying rhythm was Sinus Rhythm.   3 Supraventricular Tachycardia runs occurred, the run with the fastest interval lasting 4 beats with a max rate of 126 bpm, the longest lasting 7 beats with an avg rate of 103 bpm.     Atrial Flutter occurred (3% burden), ranging from 46-149 bpm (avg of 82 bpm), the longest lasting 1 hour 41 mins with an avg rate of 92 bpm.   Premature atrial complexes were occasional (1.0%, 13493). Premature Ventricular complexes were rare less than 1%. No ventricular tachycardia, no pauses present.   Symptoms are associated with atrial flutter, sinus rhythm and premature atrial complexes.  Conclusion: This study is remarkable for the following:                       1.  Symptomatic atrial flutter                       2.  Occasional premature atrial complexes.  Recent Labs: 01/30/2021: TSH 1.969 05/30/2021: ALT 10 07/04/2021: BUN 18; Creatinine, Ser 1.26; Hemoglobin 13.4; Platelets 135; Potassium 4.1; Sodium 140  Recent Lipid Panel    Component Value Date/Time   CHOL 120 10/03/2020 1115   CHOL 140 04/16/2015 0000   CHOL 140 04/16/2015 0000   TRIG 106.0 10/03/2020 1115   TRIG 94 04/16/2015 0000   TRIG 94 04/16/2015 0000   HDL 27.90 (L) 10/03/2020 1115   CHOLHDL 4 10/03/2020 1115   VLDL 21.2 10/03/2020 1115   LDLCALC 71 10/03/2020 1115   LDLCALC 86 04/16/2015 0000   LDLCALC 86 04/16/2015 0000    Physical Exam:    VS:  BP 116/80 (BP Location: Right Arm, Patient Position: Sitting)   Pulse 70   Ht '5\' 10"'$  (1.778 m)   Wt 153 lb (69.4 kg)   SpO2 95%   BMI 21.95 kg/m     Wt Readings from Last 3 Encounters:  08/01/21 153 lb (69.4 kg)  07/11/21 154 lb (69.9 kg)  05/30/21 152 lb 8 oz (69.2 kg)     GEN: Well nourished, well developed in no acute distress HEENT: Normal NECK: No JVD; No carotid bruits LYMPHATICS: No lymphadenopathy CARDIAC: S1S2 noted,RRR, no murmurs, rubs, gallops RESPIRATORY:  Clear to auscultation without rales,  wheezing or rhonchi  ABDOMEN: Soft, non-tender, non-distended, +bowel sounds, no guarding. EXTREMITIES: No edema, No cyanosis, no clubbing MUSCULOSKELETAL:  No deformity  SKIN: Warm and dry NEUROLOGIC:  Alert and oriented x 3, non-focal PSYCHIATRIC:  Normal affect, good insight  ASSESSMENT:    1. Typical atrial flutter (HCC)    PLAN:     He has been doing well from cardiovascular standpoint we will continue patient's current medication regimen. CHA2DS2-VASc score is 2 for now we will keep him on anticoagulation until at least 30 days post flutter ablation.  We may consider transitioning off anticoagulation if his CHA2DS2-VASc score does not change. He is on Cardizem so far blood pressure is tolerating this.  I have asked the patient to keep an eye on his blood pressure should this systolic pressure goal less than 100 we may consider stopping the Cardizem and watching him off AV nodal blockers.  The patient is in agreement with the above plan. The patient left the office in stable condition.  The patient will follow up in next month or sooner if needed.   Medication Adjustments/Labs and Tests Ordered: Current medicines are reviewed at length with the patient today.  Concerns regarding medicines are outlined above.  No orders of the defined types were placed in this encounter.  No orders of the defined types were placed in this encounter.   Patient Instructions  Medication Instructions:  Your physician recommends that you continue on your current medications as directed. Please refer to the Current Medication list given to you today.  *If you need a refill on your cardiac medications before your next appointment, please call your pharmacy*   Lab Work: None If you have labs (blood work) drawn today and your tests are completely normal, you will receive your results only by: [place] (if you have MyChart) OR  A paper copy in the mail If you have any lab test that is  abnormal or we need to change your treatment, we will call you to review the results.   Testing/Procedures: None   Follow-Up: At [facility], you and your health needs are our priority.  As part of our continuing mission to provide you with exceptional heart care, we have created designated Provider Care Teams.  These Care Teams include your primary Cardiologist (physician) and Advanced Practice Providers (APPs -  Physician Assistants and Nurse Practitioners) who all work together to provide you with the care you need, when you need it.  We recommend signing up for the patient portal called "MyChart".  Sign up information is provided on this After Visit Summary.  MyChart is used to connect with patients for Virtual Visits (Telemedicine).  Patients are able to view lab/test results, encounter notes, upcoming appointments, etc.  Non-urgent messages can be sent to your provider as well.   To learn more about what you can do with MyChart, go to [email or web address].    Your next appointment:   6 month(s)  The format for your next appointment:   In Person  Provider:   [place], DO [address], [place], [place] [address]    Other Instructions    Adopting a Healthy Lifestyle.  Know what a healthy weight is for you (roughly BMI <25) and aim to maintain this   Aim for 7+ servings of fruits and vegetables daily   65-80+ fluid ounces of water or unsweet tea for healthy kidneys   Limit to max 1 drink of alcohol per day; avoid smoking/tobacco   Limit animal fats in diet for cholesterol and heart health - choose grass fed whenever available   Avoid highly processed foods, and foods high in saturated/trans fats   Aim for low stress - take time to unwind and care for your mental health   Aim for 150 min of moderate intensity exercise weekly for heart health, and weights twice weekly for bone health   Aim for 7-9 hours of sleep daily   When it comes to  diets, agreement about the perfect plan isnt easy to find, even among the experts. Experts at the [place] developed an idea known as the Healthy Eating Plate. Just imagine a plate divided into logical, healthy portions.   The emphasis is on diet quality:   Load up on vegetables and fruits - one-half of your plate: Aim for color and variety, and remember that potatoes dont count.   Go for whole grains - one-quarter of your plate: Whole wheat, barley, wheat berries, quinoa, oats, brown rice, and foods made with them. If you want pasta, go with whole wheat pasta.   Protein power - one-quarter of your plate: Fish, chicken, beans, and nuts are all healthy, versatile protein sources. Limit red meat.   The diet, however, does go beyond the plate, offering a few other suggestions.   Use healthy plant oils, such as olive, canola, soy, corn, sunflower and peanut. Check the labels, and avoid partially hydrogenated oil, which have unhealthy trans fats.   If youre thirsty, drink water. Coffee and tea are good in moderation, but skip sugary drinks and limit milk and dairy products to one or two daily servings.   The type of carbohydrate in the diet is more important than the amount. Some sources of carbohydrates, such as vegetables, fruits, whole grains, and beans-are healthier than others.  Finally, stay active  Signed, [name], DO  08/01/2021 11:38 AM    [place]

## 2021-08-01 NOTE — Patient Instructions (Signed)
Medication Instructions:  Your physician recommends that you continue on your current medications as directed. Please refer to the Current Medication list given to you today.  *If you need a refill on your cardiac medications before your next appointment, please call your pharmacy*   Lab Work: None If you have labs (blood work) drawn today and your tests are completely normal, you will receive your results only by: [place] (if you have MyChart) OR A paper copy in the mail If you have any lab test that is abnormal or we need to change your treatment, we will call you to review the results.   Testing/Procedures: None   Follow-Up: At [facility], you and your health needs are our priority.  As part of our continuing mission to provide you with exceptional heart care, we have created designated Provider Care Teams.  These Care Teams include your primary Cardiologist (physician) and Advanced Practice Providers (APPs -  Physician Assistants and Nurse Practitioners) who all work together to provide you with the care you need, when you need it.  We recommend signing up for the patient portal called "MyChart".  Sign up information is provided on this After Visit Summary.  MyChart is used to connect with patients for Virtual Visits (Telemedicine).  Patients are able to view lab/test results, encounter notes, upcoming appointments, etc.  Non-urgent messages can be sent to your provider as well.   To learn more about what you can do with MyChart, go to [email or web address].    Your next appointment:   6 month(s)  The format for your next appointment:   In Person  Provider:   [place], DO [address], [place], [place] [address]    Other Instructions

## 2021-08-12 ENCOUNTER — Other Ambulatory Visit: Payer: Self-pay

## 2021-08-12 ENCOUNTER — Encounter: Payer: Self-pay | Admitting: Cardiology

## 2021-08-12 ENCOUNTER — Ambulatory Visit: Payer: Medicare HMO | Admitting: Cardiology

## 2021-08-12 VITALS — BP 124/62 | HR 69 | Ht 70.0 in | Wt 153.0 lb

## 2021-08-12 DIAGNOSIS — I483 Typical atrial flutter: Secondary | ICD-10-CM | POA: Diagnosis not present

## 2021-08-12 NOTE — Progress Notes (Signed)
Electrophysiology Office Note   Date:  08/12/2021   ID:  [name], DOB 1941/04/01, MRN [id number]  PCP:  Keith Bush, MD  Cardiologist:  Keith Jordan Primary Electrophysiologist:  Advay Volante Keith Leeds, MD    Chief Complaint: atrial flutter   History of Present Illness: Keith Jordan is a 80 y.o. male who is being seen today for the evaluation of atrial flutter at the request of Keith Bush, MD. Presenting today for electrophysiology evaluation.  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  He has been having intermittent palpitations.  He is now status post atrial flutter ablation 07/11/2021.  Today, denies symptoms of palpitations, chest pain, shortness of breath, orthopnea, PND, lower extremity edema, claudication, dizziness, presyncope, syncope, bleeding, or neurologic sequela. The patient is tolerating medications without difficulties.  Since his ablation he has done well.  He is noted no further episodes of atrial flutter.  He is able to do all of his daily activities without restriction.   Past Medical History:  Diagnosis Date   Abnormal breath sounds 10/11/2020   Acute sinusitis 01/29/2021   Advanced care planning/counseling discussion 09/01/2016   BPH (benign prostatic hyperplasia) 09/29/2018   Ear fullness, bilateral 04/03/2021   Health maintenance examination 09/01/2016   Hyperlipidemia    Irregular heart beat 03/24/2021   Ischemic optic neuropathy 2006   [facility] [facility]   Leukocytosis 01/30/2021   Severe s/p ER eval 01/2021   Medicare annual wellness visit, subsequent 10/04/2019   Nausea without vomiting 03/24/2021   Personal history of colonic adenoma 07/30/2008   Prediabetes 07/05/2016   A1c 6.5% 03/2014    Prediabetes 07/05/2016   A1c 6.5% 03/2014    Primary osteoarthritis of knee    bilateral s/p L knee replacement, receives R knee injections [name], [name])    Sebaceous cyst 01/30/2020   Splenic marginal zone b-cell lymphoma ([place]) 02/05/2021   Past Surgical History:  Procedure Laterality Date   A-FLUTTER ABLATION N/A 07/11/2021   Procedure: A-FLUTTER ABLATION;  Surgeon: Keith Haw, MD;  Location: [place] [facility];  Service: Cardiovascular;  Laterality: N/A;   COLONOSCOPY  12/2013   no polyps, no rpt due Keith Jordan)   CYST EXCISION  01/2020   TOTAL KNEE ARTHROPLASTY Left 2005   [name]     Current Outpatient Medications  Medication Sig Dispense Refill   acetaminophen (TYLENOL) 500 MG tablet Take 500 mg by mouth every 6 (six) hours as needed for mild pain.     allopurinol (ZYLOPRIM) 300 MG tablet TAKE 1 TABLET BY MOUTH EVERY DAY (Patient taking differently: Take 300 mg by mouth daily.) 30 tablet 1   Ascorbic Acid (VITAMIN C) 1000 MG tablet Take 500 mg by mouth 2 (two) times daily.     atorvastatin (LIPITOR) 10 MG tablet Take 1 tablet (10 mg total) by mouth daily. 90 tablet 3   carboxymethylcellulose (REFRESH PLUS) 0.5 % SOLN Place 1 drop into both eyes 2 (two) times daily as needed (dry eyes).     diltiazem (CARDIZEM CD) 120 MG 24 hr capsule Take 1 capsule (120 mg total) by mouth daily. 90 capsule 3   Multiple Vitamins-Minerals (MULTIVITAMIN WITH MINERALS) tablet Take 1 tablet by mouth daily. Unknown strenght     Multiple Vitamins-Minerals (PRESERVISION AREDS) CAPS Take 1 capsule by mouth in the morning and at bedtime. (Patient taking differently: Take 1 capsule by mouth in the morning and at bedtime. Unknown strenght)  180 capsule 3   ondansetron (ZOFRAN) 8 MG tablet Take 1 tablet (8 mg total) by mouth every 8 (eight) hours as needed for nausea or vomiting. 30 tablet 0   sodium chloride (OCEAN) 0.65 % SOLN nasal spray Place 1 spray into both nostrils as needed for congestion.     No current facility-administered medications for this visit.    Allergies:   Patient has no known allergies.   Social History:  The patient  reports that he has  never smoked. He has never used smokeless tobacco. He reports current alcohol use. He reports that he does not use drugs.   Family History:  The patient's family history includes Arthritis in his father; Blindness (age of onset: 37) in his mother; Lung disease in his father.   ROS:  Please see the history of present illness.   Otherwise, review of systems is positive for none.   All other systems are reviewed and negative.   PHYSICAL EXAM: VS:  BP 124/62   Pulse 69   Ht '5\' 10"'$  (1.778 m)   Wt 153 lb (69.4 kg)   BMI 21.95 kg/m  , BMI Body mass index is 21.95 kg/m. GEN: Well nourished, well developed, in no acute distress  HEENT: normal  Neck: no JVD, carotid bruits, or masses Cardiac: RRR; no murmurs, rubs, or gallops,no edema  Respiratory:  clear to auscultation bilaterally, normal work of breathing GI: soft, nontender, nondistended, + BS MS: no deformity or atrophy  Skin: warm and dry Neuro:  Strength and sensation are intact Psych: euthymic mood, full affect  EKG:  EKG is ordered today. Personal review of the ekg ordered shows sinus rhythm, rate 69, first-degree AV blocka   Recent Labs: 01/30/2021: TSH 1.969 05/30/2021: ALT 10 07/04/2021: BUN 18; Creatinine, Ser 1.26; Hemoglobin 13.4; Platelets 135; Potassium 4.1; Sodium 140    Lipid Panel     Component Value Date/Time   CHOL 120 10/03/2020 1115   CHOL 140 04/16/2015 0000   CHOL 140 04/16/2015 0000   TRIG 106.0 10/03/2020 1115   TRIG 94 04/16/2015 0000   TRIG 94 04/16/2015 0000   HDL 27.90 (L) 10/03/2020 1115   CHOLHDL 4 10/03/2020 1115   VLDL 21.2 10/03/2020 1115   LDLCALC 71 10/03/2020 1115   LDLCALC 86 04/16/2015 0000   LDLCALC 86 04/16/2015 0000     Wt Readings from Last 3 Encounters:  08/12/21 153 lb (69.4 kg)  08/01/21 153 lb (69.4 kg)  07/11/21 154 lb (69.9 kg)      Other studies Reviewed: Additional studies/ records that were reviewed today include: TTE 05/21/21  Review of the above records today  demonstrates:   1. Left ventricular ejection fraction, by estimation, is 70 to 75%. The  left ventricle has hyperdynamic function. The left ventricle has no  regional wall motion abnormalities. Left ventricular diastolic parameters  are consistent with Grade I diastolic  dysfunction (impaired relaxation).   2. Right ventricular systolic function is normal. The right ventricular  size is mildly enlarged. There is mildly elevated pulmonary artery  systolic pressure. The estimated right ventricular systolic pressure is  [id number] mmHg.   3. The mitral valve is normal in structure. No evidence of mitral valve  regurgitation. No evidence of mitral stenosis.   4. The aortic valve is normal in structure. Aortic valve regurgitation is  not visualized. No aortic stenosis is present.   5. The inferior vena cava is normal in size with greater than 50%  respiratory variability, suggesting  right atrial pressure of 3 mmHg.   Cardiac monitor 05/12/2020 personally reviewed 1.  Symptomatic atrial flutter 2.  Occasional premature atrial complexes.  ASSESSMENT AND PLAN:  1.  Typical atrial flutter: Currently on Eliquis and Cardizem.  CHA2DS2-VASc of at least 3.  Is status post ablation 07/11/2021.  He is remained in sinus rhythm without any further atrial flutter.  He is currently feeling well.  Stop Eliquis today.  2.  Hypertension: Currently well controlled  3.  Hyperlipidemia: Continue statin per primary care physician  Current medicines are reviewed at length with the patient today.   The patient does not have concerns regarding his medicines.  The following changes were made today: Stop Eliquis  Labs/ tests ordered today include:  Orders Placed This Encounter  Procedures   EKG 12-Lead      Disposition:   FU with Kaeli Nichelson as needed months  Signed, Kee Drudge Keith Leeds, MD  08/12/2021 11:32 AM     [place] [address] [place] [address] [phone]  (office) [phone] (fax)

## 2021-08-12 NOTE — Patient Instructions (Signed)
Medication Instructions:  Your physician has recommended you make the following change in your medication:  STOP Eliquis  *If you need a refill on your cardiac medications before your next appointment, please call your pharmacy*   Lab Work: None ordered   Testing/Procedures: None ordered   Follow-Up: At [facility], you and your health needs are our priority.  As part of our continuing mission to provide you with exceptional heart care, we have created designated Provider Care Teams.  These Care Teams include your primary Cardiologist (physician) and Advanced Practice Providers (APPs -  Physician Assistants and Nurse Practitioners) who all work together to provide you with the care you need, when you need it.  We recommend signing up for the patient portal called "MyChart".  Sign up information is provided on this After Visit Summary.  MyChart is used to connect with patients for Virtual Visits (Telemedicine).  Patients are able to view lab/test results, encounter notes, upcoming appointments, etc.  Non-urgent messages can be sent to your provider as well.   To learn more about what you can do with MyChart, go to [email or web address].    Your next appointment:   As needed  The format for your next appointment:   In Person  Provider:   Allegra Lai, MD    Thank you for choosing [facility]!!   Trinidad Curet, RN [phone]

## 2021-08-14 ENCOUNTER — Ambulatory Visit: Payer: Medicare HMO | Admitting: Cardiology

## 2021-09-02 ENCOUNTER — Telehealth: Payer: Self-pay

## 2021-09-02 NOTE — Progress Notes (Signed)
[place] [phone]   Fax:([phone]) [phone]  PROGRESS NOTE  Patient Care Team: Keith Bush, MD as PCP - General (Family Medicine) Keith Salines, DO as PCP - Cardiology (Cardiology) Specialists, Keith Jordan as Consulting Physician (Orthopedic Surgery) Keith Jordan, Keith Jordan as Pharmacist (Pharmacist)  Hematological/Oncological History # Splenic Marginal Zone Lymphoma 01/30/2021: WBC 186.3, Hgb 13.5, MCV 96.3, Plt 132. Referred to Emergency Department. 02/05/2021: establish care with Dr. Lorenso Jordan  02/24/2021: Week 1 of Monotherapy Rituximab 03/03/2021:  Week 2 of Monotherapy Rituximab 03/10/2021:  Week 3 of Monotherapy Rituximab. WBC 109.8 03/17/2021: Week 4 of Monotherapy Rituximab. WBC 94.8 03/24/2021: WBC 59.8 05/01/2021: WBC 89.1, Hgb 13.5, Plt 136 09/03/2021: WBC 97.8, Hgb 12.9, Plt 141  Interval History:  Keith Jordan 80 y.o. male with medical history significant for splenic marginal zone lymphoma who presents for a follow up visit. The patient's last visit was on 05/30/2021. In the interim since the last visit he has been in stable health.   On exam today Keith Jordan reports he has been well in the interim since our last visit.  He notes he is not having any issues with fevers, chills, sweats, nausea, vomiting or diarrhea.  He notes his energy is good but "could be better".  He notes he does have some rare pain on the left side of his abdomen.  He denies any swelling of his abdomen or poor appetite.  He has gained 3 pounds in the interim since his last visit.  A full 10 point ROS is listed below.  MEDICAL HISTORY:  Past Medical History:  Diagnosis Date   Abnormal breath sounds 10/11/2020   Acute sinusitis 01/29/2021   Advanced care planning/counseling discussion 09/01/2016   BPH (benign prostatic hyperplasia) 09/29/2018   Ear fullness, bilateral 04/03/2021   Health maintenance examination 09/01/2016   Hyperlipidemia    Irregular heart beat 03/24/2021    Ischemic optic neuropathy 2006   [facility] [facility]   Leukocytosis 01/30/2021   Severe s/p ER eval 01/2021   Medicare annual wellness visit, subsequent 10/04/2019   Nausea without vomiting 03/24/2021   Personal history of colonic adenoma 07/30/2008   Prediabetes 07/05/2016   A1c 6.5% 03/2014    Prediabetes 07/05/2016   A1c 6.5% 03/2014    Primary osteoarthritis of knee    bilateral s/p L knee replacement, receives R knee injections Keith Jordan, Keith Jordan)   Sebaceous cyst 01/30/2020   Splenic marginal zone b-cell lymphoma ([place]) 02/05/2021    SURGICAL HISTORY: Past Surgical History:  Procedure Laterality Date   A-FLUTTER ABLATION N/A 07/11/2021   Procedure: A-FLUTTER ABLATION;  Surgeon: Keith Haw, MD;  Location: [place] [facility];  Service: Cardiovascular;  Laterality: N/A;   COLONOSCOPY  12/2013   no polyps, no rpt due Keith Jordan)   CYST EXCISION  01/2020   TOTAL KNEE ARTHROPLASTY Left 2005   Keith Jordan    SOCIAL HISTORY: Social History   Socioeconomic History   Marital status: Married    Spouse name: Not on file   Number of children: Not on file   Years of education: Not on file   Highest education level: Not on file  Occupational History   Not on file  Tobacco Use   Smoking status: Never   Smokeless tobacco: Never  Vaping Use   Vaping Use: Never used  Substance and Sexual Activity   Alcohol use: Yes    Comment: occasional   Drug use: No   Sexual activity: Not Currently  Other Topics  Concern   Not on file  Social History Narrative   Lives with wife, 2 cats   Occ: retired Engineer, 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   Diet: some water, fruits/vegetables daily   Social Determinants of Radio broadcast assistant Strain: Low Risk    Difficulty of Paying Living Expenses: Not hard at all  Food Insecurity: No Food Insecurity   Worried About Charity fundraiser in the Last Year: Never true   Arboriculturist in the Last Year: Never true  Transportation Needs: No Transportation Needs    Lack of Transportation (Medical): No   Lack of Transportation (Non-Medical): No  Physical Activity: Inactive   Days of Exercise per Week: 0 days   Minutes of Exercise per Session: 0 min  Stress: No Stress Concern Present   Feeling of Stress : Not at all  Social Connections: Not on file  Intimate Partner Violence: Not At Risk   Fear of Current or Ex-Partner: No   Emotionally Abused: No   Physically Abused: No   Sexually Abused: No    FAMILY HISTORY: Family History  Problem Relation Age of Onset   Blindness Mother 81       ?stroke in eyes   Lung disease Father        coal miner   Arthritis Father    Colon cancer Neg Hx    Pancreatic cancer Neg Hx    Stomach cancer Neg Hx    Esophageal cancer Neg Hx    Rectal cancer Neg Hx    Cancer Neg Hx    Diabetes Neg Hx    CAD Neg Hx    Stroke Neg Hx     ALLERGIES:  has No Known Allergies.  MEDICATIONS:  Current Outpatient Medications  Medication Sig Dispense Refill   acetaminophen (TYLENOL) 500 MG tablet Take 500 mg by mouth every 6 (six) hours as needed for mild pain.     allopurinol (ZYLOPRIM) 300 MG tablet TAKE 1 TABLET BY MOUTH EVERY DAY (Patient taking differently: Take 300 mg by mouth daily.) 30 tablet 1   Ascorbic Acid (VITAMIN C) 1000 MG tablet Take 500 mg by mouth 2 (two) times daily.     atorvastatin (LIPITOR) 10 MG tablet Take 1 tablet (10 mg total) by mouth daily. 90 tablet 3   carboxymethylcellulose (REFRESH PLUS) 0.5 % SOLN Place 1 drop into both eyes 2 (two) times daily as needed (dry eyes).     diltiazem (CARDIZEM CD) 120 MG 24 hr capsule Take 1 capsule (120 mg total) by mouth daily. 90 capsule 3   Multiple Vitamins-Minerals (MULTIVITAMIN WITH MINERALS) tablet Take 1 tablet by mouth daily. Unknown strenght     Multiple Vitamins-Minerals (PRESERVISION AREDS) CAPS Take 1 capsule by mouth in the morning and at bedtime. (Patient taking differently: Take 1 capsule by mouth in the morning and at bedtime. Unknown strenght)  180 capsule 3   ondansetron (ZOFRAN) 8 MG tablet Take 1 tablet (8 mg total) by mouth every 8 (eight) hours as needed for nausea or vomiting. 30 tablet 0   sodium chloride (OCEAN) 0.65 % SOLN nasal spray Place 1 spray into both nostrils as needed for congestion.     No current facility-administered medications for this visit.    REVIEW OF SYSTEMS:   Constitutional: ( - ) fevers, ( - )  chills , ( - ) night sweats Eyes: ( - ) blurriness of vision, ( - ) double vision, ( - ) watery  eyes Ears, nose, mouth, throat, and face: ( - ) mucositis, ( - ) sore throat Respiratory: ( - ) cough, ( - ) dyspnea, ( - ) wheezes Cardiovascular: ( - ) palpitation, ( - ) chest discomfort, ( - ) lower extremity swelling Gastrointestinal:  ( + ) nausea, ( - ) heartburn, ( - ) change in bowel habits Skin: ( - ) abnormal skin rashes Lymphatics: ( - ) new lymphadenopathy, ( - ) easy bruising Neurological: ( - ) numbness, ( + ) tingling, ( - ) new weaknesses Behavioral/Psych: ( - ) mood change, ( - ) new changes  All other systems were reviewed with the patient and are negative.  PHYSICAL EXAMINATION: ECOG PERFORMANCE STATUS: 1 - Symptomatic but completely ambulatory  Vitals:   09/03/21 1051  BP: (!) 144/60  Pulse: 73  Resp: 18  Temp: 98.4 F (36.9 C)  SpO2: 98%    Filed Weights   09/03/21 1051  Weight: 156 lb 4.8 oz (70.9 kg)    GENERAL: well appearing elderly Caucasian male alert, no distress and comfortable SKIN: skin color, texture, turgor are normal, no rashes or significant lesions EYES: conjunctiva are pink and non-injected, sclera clear LUNGS: clear to auscultation and percussion with normal breathing effort HEART: regular rate & rhythm and no murmurs and no lower extremity edema Musculoskeletal: no cyanosis of digits and no clubbing  PSYCH: alert & oriented x 3, fluent speech NEURO: no focal motor/sensory deficits  LABORATORY DATA:  I have reviewed the data as listed CBC Latest Ref Rng &  Units 09/03/2021 07/04/2021 05/30/2021  WBC 4.0 - 10.5 K/uL 97.8(HH) 96.5(HH) 84.7(HH)  Hemoglobin 13.0 - 17.0 g/dL 12.9(L) 13.4 13.6  Hematocrit 39.0 - 52.0 % 39.2 41.0 42.0  Platelets 150 - 400 K/uL 141(L) 135(L) 152    CMP Latest Ref Rng & Units 09/03/2021 07/04/2021 05/30/2021  Glucose 70 - 99 mg/dL 243(H) 122(H) 205(H)  BUN 8 - 23 mg/dL 21 18 26(H)  Creatinine 0.61 - 1.24 mg/dL 1.24 1.26 1.36(H)  Sodium 135 - 145 mmol/L 142 140 140  Potassium 3.5 - 5.1 mmol/L 4.1 4.1 4.1  Chloride 98 - 111 mmol/L 105 101 101  CO2 22 - 32 mmol/L _0 Calcium 8.9 - 10.3 mg/dL 9.4 9.3 9.2  Total Protein 6.5 - 8.1 g/dL 6.6 - 6.6  Total Bilirubin 0.3 - 1.2 mg/dL 0.6 - 0.6  Alkaline Phos 38 - 126 U/L 85 - 87  AST 15 - 41 U/L 23 - 23  ALT 0 - 44 U/L 11 - 10   RADIOGRAPHIC STUDIES: No results found.   ASSESSMENT & PLAN Keith Jordan 80 y.o. male with medical history significant for splenic marginal zone lymphoma. CT CAP from 02/13/21  showed only splenic involvement and a bone marrow biopsy from 02/21/21 confirmed the diagnosis.  The patient has only leukocytosis and splenomegaly.  The recommendation is monotherapy rituximab 375 mg per metered squared q. 7 days x 4 doses. He completed this on 03/17/2021.    Patient is doing well without any significant limitations. Labs from today show stably elevated leukocytosis and modest stable thrombocytopenia. Plan for patient to return to the clinic in 12 weeks.  #Splenic Marginal Zone Lymphoma, stable --findings are consistent with a splenic marginal zone lymphoma. --patient completed bone marrow biopsy and a CT chest abdomen pelvis. Disease appear limited to peripheral blood and spleen.  --Treatment of choice with confirmed splenic marginal zone lymphoma is rituximab 375 mg/m q. 7 days x  4 doses. He completed this on 03/17/2021.  --Due to persistent lymphocytosis, recommend to continue with allopurinol to prevent TLS. --if there is concern for worsening disease with  an indication for treatment can consider treatment with Ibrutinib/Zanbrutinib vs Bendamustine + ritux/obinutuzumab. Would need caution with ibrutinib given patient's cardiac history and current anticoagulation therapy.  --RTC in 3 months with labs   #Thrombocytopenia --Plt today stable at 141 --likely 2/2 to splenomegaly.   #Nausea --Take Zofran 8 mg q 8 hours PRN.   #Atrial Flutter --patient follows with Cardiology  No orders of the defined types were placed in this encounter.  All questions were answered. The patient knows to call the clinic with any problems, questions or concerns.  A total of more than 30 minutes were spent on this encounter and over half of that time was spent on counseling and coordination of care as outlined above.   Ledell Peoples, MD Department of Hematology/Oncology [facility] Phone: [phone] Pager: [phone] Email: [email or web address]

## 2021-09-02 NOTE — Progress Notes (Addendum)
Chronic Care Management Pharmacy Assistant   Name: TEREN NHAN  MRN: [id number] DOB: 11/21/1941  Keith Jordan is an 80 y.o. year old male who presents for his initial CCM visit with the clinical pharmacist.  Reason for Encounter: Initial Questions   Conditions to be addressed/monitored: HLD  Recent office visits:  04/03/2021 - Dr. Monico Blitz presented for ear ache.Consider ENT referral. Started: predniSONE (DELTASONE) 20 MG tablet  Recent consult visits:  08/12/2021 Allegra Lai, MD Patient presented for Electrophysiology evaluation. Stop: Eliquis. EKG ordered.  08/01/2021 [name], DO Patient presented for follow up in regards to Ablation. Consider transitioning off anticoagulation if his CHA2DS2-VASc score does not change. 07/14/2021 Urgent Care Coronavirus 2 lab order. May take May take Tylenol and/or Ibuprofen as needed for pain and fever. May continue OTC medications, including Tylenol cold and sinus, for symptom relief. 07/11/2021 Hulan Fray, MD. Patient presented for A-Flutter Ablation.  05/30/2021 Narda Rutherford, [facility] Patient presented for low back pain and left shoulder pain. Ordered DG Arthro Shoulder Left and DG Lumbar Spine Complete.  05/29/2021 Allegra Lai, MD Cardiology. Patient presented for evaluation of atrial flutter and Electrophysiology evaluation. Patient agreed to Ablation procedure. Labs: Basic metabolic panel and CBC. [id number] [name], DO Phone call to patient. Decreased Cardizem to '120mg'$  daily. 05/14/2021 [name], DO Patient presented for follow up visit for Atrial Flutter. Increased Diltiazem to '180mg'$  daily. Labs: CBC. Referal to Cardiac Electrophysiology. Follow up 3 months.  05/01/2021 Narda Rutherford, MD Patient presented for follow up splenic marginal zone lymphoma. Started: cyclobenzaprine (FLEXERIL) 5 MG tablet. Follow up four weeks.  04/18/2021 Resa Miner, RN Telephone Encounter. Patient was called by RN as he was in Atrial  Fibrillation as received by Zio. Started: Eliquis '5mg'$  twice daily.  04/17/2021 [name], DO Cardiology. Patient presents for palpitations. Echocardiogram and EKG. The ekg ordered today demonstrates sinus rhythm, heart rate 72 bpm with prolonged pr interval. Started: Zio Patch for 14 days to rule out cardiovascular etiology. Stopped: Prednisone. Follow up in 3 months. 04/07/2021 Dede Query, [place] - Patient presented for follow up of 4th dose of Chemotherapy. Labs ordered: CBC, CMP and LDH. Follow-up in 2 weeks for repeat labs.  03/24/2021 [facility] - Patient presented for Chemotherapy (Rituximab).   03/17/2021 [place] - Patient presented for Chemotherapy (Rituximab).   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  03/03/2021 [place] - Patient presented for Chemotherapy (Rituximab).    Hospital visits:  Medication Reconciliation was completed by comparing discharge summary, patient's EMR and Pharmacy list, and upon discussion with patient.   Hospital Visits: 07/11/2021 Pt presented for A-Flutter Ablation. Discharge date was 07/11/2021. Discharged from [facility].     Medications: Outpatient Encounter Medications as of 09/02/2021  Medication Sig   acetaminophen (TYLENOL) 500 MG tablet Take 500 mg by mouth every 6 (six) hours as needed for mild pain.   allopurinol (ZYLOPRIM) 300 MG tablet TAKE 1 TABLET BY MOUTH EVERY DAY (Patient taking differently: Take 300 mg by mouth daily.)   Ascorbic Acid (VITAMIN C) 1000 MG tablet Take 500 mg by mouth 2 (two) times daily.   atorvastatin (LIPITOR) 10 MG tablet Take 1 tablet (10 mg total) by mouth daily.   carboxymethylcellulose (REFRESH PLUS) 0.5 % SOLN Place 1 drop into both eyes 2 (two) times daily as needed (dry eyes).   diltiazem (  CARDIZEM CD) 120 MG 24 hr capsule Take 1  capsule (120 mg total) by mouth daily.   Multiple Vitamins-Minerals (MULTIVITAMIN WITH MINERALS) tablet Take 1 tablet by mouth daily. Unknown strenght   Multiple Vitamins-Minerals (PRESERVISION AREDS) CAPS Take 1 capsule by mouth in the morning and at bedtime. (Patient taking differently: Take 1 capsule by mouth in the morning and at bedtime. Unknown strenght)   ondansetron (ZOFRAN) 8 MG tablet Take 1 tablet (8 mg total) by mouth every 8 (eight) hours as needed for nausea or vomiting.   sodium chloride (OCEAN) 0.65 % SOLN nasal spray Place 1 spray into both nostrils as needed for congestion.   No facility-administered encounter medications on file as of 09/02/2021.    Lab Results  Component Value Date/Time   HGBA1C 6.3 10/03/2020 11:15 AM   HGBA1C 6.3 09/28/2019 09:17 AM   HGBA1C 6.5 04/16/2014 12:00 AM   MICROALBUR 2.9 (H) 09/26/2018 09:22 AM   MICROALBUR 0.8 03/25/2018 10:08 AM     BP Readings from Last 3 Encounters:  08/12/21 124/62  08/01/21 116/80  07/14/21 131/66    Patient contacted to review initial questions prior to visit with Debbora Dus.  Have you seen any other providers since your last visit with PCP? Yes  Any changes in your medications or health? No  Any side effects from any medications? No  Do you have an symptoms or problems not managed by your medications? No  Any concerns about your health right now? No  Has your provider asked that you check blood pressure, blood sugar, or follow special diet at home? Patient is watching his carbs and sugar intake due to HLD.  Do you get any type of exercise on a regular basis? Yes - Patient will walk around the mall or [place]. He is exercising more now that he had the Ablation done.   Can you think of a goal you would like to reach for your health? No  Do you have any problems getting your medications? Yes, Eliquis is expensive.   I asked the patient about being told to stop Eliquis at his appointment on 08/12/2021  with Meredith Leeds, MD (Cardiology).  Patient stated that he did stop taking it, but 3 days later his heart was jumping around so he went back on it. He did not consult any provider before starting Eliquis again. Patient states a week later of being back on Eliquis his heart is back to normal. Patient states Dr. Curt Bears is not aware he went back on it. Patient did state he is going to call Dr. Curt Bears tomorrow and let him know he is taking it again.   Is there anything that you would like to discuss during the appointment? No   MAISON STANO was reminded to have all medications, supplements and any blood glucose and blood pressure readings available for review with Debbora Dus, Pharm. D, at his telephone visit on 09/08/2021 at 11:00 am. Patient was switched from face-to-face appointment to a telephone visit. Patient was made aware and agreed to a telephone visit.    Star Rating Drugs:  Medication:  Last Fill: Day Supply Atorvastatin  08/07/2021 90  Care Gaps: Last annual wellness visit: 10/03/2021  Debbora Dus, CPP notified  Marijean Niemann, [place] Pharmacy Assistant [phone]   I have reviewed the care management and care coordination activities outlined in this encounter and I am certifying that I agree with the content of this note. No further action required.  Debbora Dus, PharmD  Clinical Pharmacist [place] [facility] at [facility] [phone]

## 2021-09-03 ENCOUNTER — Other Ambulatory Visit: Payer: Self-pay

## 2021-09-03 ENCOUNTER — Inpatient Hospital Stay: Payer: Medicare HMO

## 2021-09-03 ENCOUNTER — Inpatient Hospital Stay: Payer: Medicare HMO | Attending: Hematology and Oncology | Admitting: Hematology and Oncology

## 2021-09-03 ENCOUNTER — Encounter: Payer: Self-pay | Admitting: Hematology and Oncology

## 2021-09-03 VITALS — BP 144/60 | HR 73 | Temp 98.4°F | Resp 18 | Wt 156.3 lb

## 2021-09-03 DIAGNOSIS — C8307 Small cell B-cell lymphoma, spleen: Secondary | ICD-10-CM | POA: Diagnosis not present

## 2021-09-03 DIAGNOSIS — R161 Splenomegaly, not elsewhere classified: Secondary | ICD-10-CM | POA: Insufficient documentation

## 2021-09-03 DIAGNOSIS — I4892 Unspecified atrial flutter: Secondary | ICD-10-CM | POA: Insufficient documentation

## 2021-09-03 DIAGNOSIS — D72820 Lymphocytosis (symptomatic): Secondary | ICD-10-CM

## 2021-09-03 DIAGNOSIS — R110 Nausea: Secondary | ICD-10-CM | POA: Diagnosis not present

## 2021-09-03 DIAGNOSIS — D696 Thrombocytopenia, unspecified: Secondary | ICD-10-CM | POA: Insufficient documentation

## 2021-09-03 LAB — LACTATE DEHYDROGENASE: LDH: 177 U/L (ref 98–192)

## 2021-09-03 LAB — CBC WITH DIFFERENTIAL (CANCER CENTER ONLY)
Abs Immature Granulocytes: 0.37 10*3/uL — ABNORMAL HIGH (ref 0.00–0.07)
Basophils Absolute: 0.1 10*3/uL (ref 0.0–0.1)
Basophils Relative: 0 %
Eosinophils Absolute: 0.4 10*3/uL (ref 0.0–0.5)
Eosinophils Relative: 0 %
HCT: 39.2 % (ref 39.0–52.0)
Hemoglobin: 12.9 g/dL — ABNORMAL LOW (ref 13.0–17.0)
Immature Granulocytes: 0 %
Lymphocytes Relative: 92 %
Lymphs Abs: 88.6 10*3/uL — ABNORMAL HIGH (ref 0.7–4.0)
MCH: 31.3 pg (ref 26.0–34.0)
MCHC: 32.9 g/dL (ref 30.0–36.0)
MCV: 95.1 fL (ref 80.0–100.0)
Monocytes Absolute: 4.3 10*3/uL — ABNORMAL HIGH (ref 0.1–1.0)
Monocytes Relative: 4 %
Neutro Abs: 4 10*3/uL (ref 1.7–7.7)
Neutrophils Relative %: 4 %
Platelet Count: 141 10*3/uL — ABNORMAL LOW (ref 150–400)
RBC: 4.12 MIL/uL — ABNORMAL LOW (ref 4.22–5.81)
RDW: 14.9 % (ref 11.5–15.5)
WBC Count: 97.8 10*3/uL (ref 4.0–10.5)
nRBC: 0 % (ref 0.0–0.2)

## 2021-09-03 LAB — CMP (CANCER CENTER ONLY)
ALT: 11 U/L (ref 0–44)
AST: 23 U/L (ref 15–41)
Albumin: 3.9 g/dL (ref 3.5–5.0)
Alkaline Phosphatase: 85 U/L (ref 38–126)
Anion gap: 11 (ref 5–15)
BUN: 21 mg/dL (ref 8–23)
CO2: 26 mmol/L (ref 22–32)
Calcium: 9.4 mg/dL (ref 8.9–10.3)
Chloride: 105 mmol/L (ref 98–111)
Creatinine: 1.24 mg/dL (ref 0.61–1.24)
GFR, Estimated: 59 mL/min — ABNORMAL LOW (ref >60)
Glucose, Bld: 243 mg/dL — ABNORMAL HIGH (ref 70–99)
Potassium: 4.1 mmol/L (ref 3.5–5.1)
Sodium: 142 mmol/L (ref 135–145)
Total Bilirubin: 0.6 mg/dL (ref 0.3–1.2)
Total Protein: 6.6 g/dL (ref 6.5–8.1)

## 2021-09-04 ENCOUNTER — Telehealth: Payer: Self-pay | Admitting: Cardiology

## 2021-09-04 NOTE — Telephone Encounter (Signed)
Pt c/o medication issue:  1. Name of Medication:  apixaban (ELIQUIS) 5 MG TABS tablet  2. How are you currently taking this medication (dosage and times per day)? 1 tablet two times daily   3. Are you having a reaction (difficulty breathing--STAT)? No   4. What is your medication issue? Eon is calling requesting to speak with Sherri in regards to the next steps since the procedure did not work causing him to go back on Eliquis. He states he feels a lot better now that he is back on it, just wanted to discuss where he goes from here.

## 2021-09-04 NOTE — Telephone Encounter (Signed)
Pt reports that he experienced palpitations/abn rhythm starting last Thursday,  states he restarted his Eliquis. Pt aware that I will discuss with Dr. Laroy Apple but that most likely we will order a  1-2 week monitor to determine what the abn rhythm is prior to making a decision.... Pt agreeable to plan and aware I will call once discuss with Dr. Curt Bears.

## 2021-09-08 ENCOUNTER — Ambulatory Visit (INDEPENDENT_AMBULATORY_CARE_PROVIDER_SITE_OTHER): Payer: Medicare HMO

## 2021-09-08 ENCOUNTER — Other Ambulatory Visit: Payer: Self-pay

## 2021-09-08 DIAGNOSIS — E782 Mixed hyperlipidemia: Secondary | ICD-10-CM

## 2021-09-08 DIAGNOSIS — R7303 Prediabetes: Secondary | ICD-10-CM

## 2021-09-08 NOTE — Progress Notes (Signed)
Chronic Care Management Pharmacy Note  09/08/2021 Name:  Keith Jordan MRN:  [id number] DOB:  1941-12-19  Subjective: Keith Jordan is an 80 y.o. year old male who is a primary patient of [name], MD.  The CCM team was consulted for assistance with disease management and care coordination needs.    Engaged with patient by telephone for initial visit in response to provider referral for pharmacy case management and/or care coordination services.   Consent to Services:  The patient was given the following information about Chronic Care Management services today, agreed to services, and gave verbal consent: 1. CCM service includes personalized support from designated clinical staff supervised by the primary care provider, including individualized plan of care and coordination with other care providers 2. 24/7 contact phone numbers for assistance for urgent and routine care needs. 3. Service will only be billed when office clinical staff spend 20 minutes or more in a month to coordinate care. 4. Only one practitioner may furnish and bill the service in a calendar month. 5.The patient may stop CCM services at any time (effective at the end of the month) by phone call to the office staff. 6. The patient will be responsible for cost sharing (co-pay) of up to 20% of the service fee (after annual deductible is met). Patient agreed to services and consent obtained.  Patient Care Team: [name], MD as PCP - General (Family Medicine) [name], DO as PCP - Cardiology (Cardiology) [facility], [place] as Consulting Physician (Orthopedic Surgery) Debbora Dus, [facility] as Pharmacist (Pharmacist)  Recent office visits: 04/03/2021 - Dr. Silvio Pate - Patient presented for ear ache. Consider ENT referral. Started: predniSONE (DELTASONE) 20 MG tablet 10/11/20 - PCP - Pt presented for health maintenance. Patient doing well today. Follow up in 1 year.    Recent consult visits:   08/12/2021 Allegra Lai, MD Patient presented for Electrophysiology evaluation. Stop: Eliquis. EKG ordered.  08/01/2021 [name], DO Patient presented for follow up in regards to Ablation. Consider transitioning off anticoagulation if his CHA2DS2-VASc score does not change. 07/14/2021 Urgent Care Coronavirus 2 lab order. May take May take Tylenol and/or Ibuprofen as needed for pain and fever. May continue OTC medications, including Tylenol cold and sinus, for symptom relief. 05/20/2021 [name], DO Phone call to patient. Decreased Cardizem to 134m daily. 05/14/2021 KBerniece Salines DO Patient presented for follow up visit for Atrial Flutter. Increased Diltiazem to 1859mdaily. Labs: CBC. Referal to Cardiac Electrophysiology. Follow up 3 months.  05/01/2021 JoNarda RutherfordMD Patient presented for follow up splenic marginal zone lymphoma. Started: cyclobenzaprine (FLEXERIL) 5 MG tablet. Follow up four weeks.  04/18/2021 MoResa MinerRN Telephone Encounter. Patient was called by RN as he was in Atrial Fibrillation as received by Zio. Started: Eliquis 35m9mwice daily.  04/17/2021 KarBerniece SalinesO Cardiology. Patient presents for palpitations. Echocardiogram and EKG. The ekg ordered today demonstrates sinus rhythm, heart rate 72 bpm with prolonged pr interval. Started: Zio Patch for 14 days to rule out cardiovascular etiology. Stopped: Prednisone. Follow up in 3 months. 04/07/2021 IreDede QueryA [place] for follow up of 4th dose of Chemotherapy. Labs ordered: CBC, CMP and LDH. Follow-up in 2 weeks for repeat labs.  [facility]    Hospital Visits: 07/11/2021 - Pt presented for A-Flutter Ablation. Discharge date was 07/11/2021. [facility]   Objective:  Lab Results  Component Value Date   CREATININE 1.24 09/03/2021   BUN 21 09/03/2021   GFR 63.71  10/03/2020   GFRNONAA 59 (L) 09/03/2021   GFRAA  02/23/2008    >60        The eGFR has been calculated using the MDRD equation. This calculation has not been validated in all clinical   NA 142 09/03/2021   K 4.1 09/03/2021   CALCIUM 9.4 09/03/2021   CO2 26 09/03/2021   GLUCOSE 243 (H) 09/03/2021    Lab Results  Component Value Date/Time   HGBA1C 6.3 10/03/2020 11:15 AM   HGBA1C 6.3 09/28/2019 09:17 AM   HGBA1C 6.5 04/16/2014 12:00 AM   GFR 63.71 10/03/2020 11:15 AM   GFR 66.92 09/28/2019 09:17 AM   MICROALBUR 2.9 (H) 09/26/2018 09:22 AM   MICROALBUR 0.8 03/25/2018 10:08 AM    Lab Results  Component Value Date   CHOL 120 10/03/2020   HDL 27.90 (L) 10/03/2020   LDLCALC 71 10/03/2020   TRIG 106.0 10/03/2020   CHOLHDL 4 10/03/2020    Hepatic Function Latest Ref Rng & Units 09/03/2021 05/30/2021 05/01/2021  Total Protein 6.5 - 8.1 g/dL 6.6 6.6 6.7  Albumin 3.5 - 5.0 g/dL 3.9 3.7 4.0  AST 15 - 41 U/L 23 23 29   ALT 0 - 44 U/L 11 10 10   Alk Phosphatase 38 - 126 U/L 85 87 80  Total Bilirubin 0.3 - 1.2 mg/dL 0.6 0.6 0.6  Bilirubin, Direct 0.0 - 0.2 mg/dL - - -    Lab Results  Component Value Date/Time   TSH 1.969 01/30/2021 12:03 PM    CBC Latest Ref Rng & Units 09/03/2021 07/04/2021 05/30/2021  WBC 4.0 - 10.5 K/uL 97.8(HH) 96.5(HH) 84.7(HH)  Hemoglobin 13.0 - 17.0 g/dL 12.9(L) 13.4 13.6  Hematocrit 39.0 - 52.0 % 39.2 41.0 42.0  Platelets 150 - 400 K/uL 141(L) 135(L) 152    No results found for: VD25OH  Clinical ASCVD: No  The ASCVD Risk score (Arnett DK, et al., 2019) failed to calculate for the following reasons:   The valid total cholesterol range is 130 to 320 mg/dL    Depression screen [facility] 2/9 10/04/2020 10/04/2019 09/26/2018  Decreased Interest 0 0 0  Down, Depressed, Hopeless 0 0 0  PHQ - 2 Score 0 0 0  Altered sleeping 0 - 0  Tired, decreased energy 0 - 0  Change in appetite 0 - 0  Feeling bad or failure about yourself  0 - 0  Trouble concentrating 0 - 0  Moving  slowly or fidgety/restless 0 - 0  Suicidal thoughts 0 - 0  PHQ-9 Score 0 - 0  Difficult doing work/chores Not difficult at all - Not difficult at all    Social History   Tobacco Use  Smoking Status Never  Smokeless Tobacco Never   BP Readings from Last 3 Encounters:  09/03/21 (!) 144/60  08/12/21 124/62  08/01/21 116/80   Pulse Readings from Last 3 Encounters:  09/03/21 73  08/12/21 69  08/01/21 70   Wt Readings from Last 3 Encounters:  09/03/21 156 lb 4.8 oz (70.9 kg)  08/12/21 153 lb (69.4 kg)  08/01/21 153 lb (69.4 kg)   BMI Readings from Last 3 Encounters:  09/03/21 22.43 kg/m  08/12/21 21.95 kg/m  08/01/21 21.95 kg/m    Assessment/Interventions: Review of patient past medical history, allergies, medications, health status, including review of consultants reports, laboratory and other test data, was performed as part of comprehensive evaluation and provision of  chronic care management services.   SDOH:  (Social Determinants of Health) assessments and interventions performed: Yes SDOH Interventions    Flowsheet Row Most Recent Value  SDOH Interventions   Financial Strain Interventions Intervention Not Indicated      SDOH Screenings   Alcohol Screen: Low Risk    Last Alcohol Screening Score (AUDIT): 1  Depression (PHQ2-9): Low Risk    PHQ-2 Score: 0  Financial Resource Strain: Low Risk    Difficulty of Paying Living Expenses: Not very hard  Food Insecurity: No Food Insecurity   Worried About Charity fundraiser in the Last Year: Never true   Ran Out of Food in the Last Year: Never true  Housing: Low Risk    Last Housing Risk Score: 0  Physical Activity: Inactive   Days of Exercise per Week: 0 days   Minutes of Exercise per Session: 0 min  Social Connections: Not on file  Stress: No Stress Concern Present   Feeling of Stress : Not at all  Tobacco Use: Low Risk    Smoking Tobacco Use: Never   Smokeless Tobacco Use: Never  Transportation Needs: No  Transportation Needs   Lack of Transportation (Medical): No   Lack of Transportation (Non-Medical): No    CCM Care Plan  No Known Allergies  Medications Reviewed Today     Reviewed by Debbora Dus, [facility] (Pharmacist) on 09/08/21 at 1114  Med List Status: <None>   Medication Order Taking? Sig Documenting Provider Last Dose Status Informant  acetaminophen (TYLENOL) 500 MG tablet [id number] Yes Take 500 mg by mouth every 6 (six) hours as needed for mild pain. [provider] Taking Active Self  Ascorbic Acid (VITAMIN C) 1000 MG tablet [id number] Yes Take 500 mg by mouth 2 (two) times daily. [provider] Taking Active Self  atorvastatin (LIPITOR) 10 MG tablet [id number] Yes Take 1 tablet (10 mg total) by mouth daily. [name], MD Taking Active Self  carboxymethylcellulose (REFRESH PLUS) 0.5 % SOLN [id number] Yes Place 1 drop into both eyes 2 (two) times daily as needed (dry eyes). [provider] Taking Active Self  diltiazem (CARDIZEM CD) 120 MG 24 hr capsule [id number] Yes Take 1 capsule (120 mg total) by mouth daily. [name], DO Taking Active Self  Multiple Vitamins-Minerals (MULTIVITAMIN WITH MINERALS) tablet [id number] Yes Take 1 tablet by mouth daily. Unknown strenght [provider] Taking Active Self  Multiple Vitamins-Minerals (PRESERVISION AREDS) CAPS [id number] Yes Take 1 capsule by mouth in the morning and at bedtime.  Patient taking differently: Take 1 capsule by mouth in the morning and at bedtime. Unknown strenght   [name], MD Taking Active   ondansetron [facility]) 8 MG tablet [id number] Yes Take 1 tablet (8 mg total) by mouth every 8 (eight) hours as needed for nausea or vomiting. Lincoln Brigham, PA-C Taking Active Self  sodium chloride (OCEAN) 0.65 % SOLN nasal spray [id number] Yes Place 1 spray into both nostrils as needed for congestion. 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   Leukocytosis 01/30/2021   Acute sinusitis 01/29/2021   Abnormal breath sounds 10/11/2020   Sebaceous cyst 01/30/2020   Medicare annual wellness visit, subsequent 10/04/2019   BPH (benign prostatic hyperplasia) 09/29/2018  Health maintenance examination 09/01/2016   Advanced care planning/counseling discussion 09/01/2016   Prediabetes 07/05/2016   Prediabetes 07/05/2016   Hyperlipidemia    Primary osteoarthritis of knee    Ischemic optic neuropathy    Personal history of colonic adenoma 07/30/2008    Immunization History  Administered Date(s) Administered   Fluad Quad(high Dose 65+) 10/04/2019, 10/11/2020   Influenza Split 10/30/2013   Influenza,inj,Quad PF,6+ Mos 09/14/2017, 09/26/2018   Influenza-Unspecified 09/01/2016   PFIZER(Purple Top)SARS-COV-2 Vaccination 02/03/2020, 02/24/2020   Pneumococcal Conjugate-13 09/08/2016   Pneumococcal Polysaccharide-23 09/14/2017    Conditions to be addressed/monitored:  Hyperlipidemia, Diabetes, and Atrial Fibrillation  Care Plan : [place]  Updates made by Debbora Dus, [place] since 10/02/2021 12:00 AM     Problem: CHL AMB "PATIENT-SPECIFIC PROBLEM"      Long-Range Goal: Disease Management   Start Date: 09/08/2021  Priority: High  Note:   Current Barriers:  None identified  Pharmacist Clinical Goal(s):  Patient will contact provider office for questions/concerns as evidenced notation of same in electronic health record through collaboration with PharmD and provider.   Interventions: 1:1 collaboration with [name], MD regarding development and update of comprehensive plan of care as evidenced by provider attestation and co-signature Inter-disciplinary care team collaboration (see longitudinal plan of  care) Comprehensive medication review performed; medication list updated in electronic medical record  Hyperlipidemia: (LDL goal < 100) -Controlled - LDL 71 -Current treatment: Atorvastatin 10 mg - 1 tablet daily -Medications previously tried: none  -Current dietary patterns: see diabetes section -Current exercise habits: Yes - Patient will walk around the mall or [place]. He is exercising more now that he had the Ablation done. He mowed lawn last week. Lives with wife, she does cooking. -Denies any problem -Educated on Denies any side effects -Recommended to continue current medication  Pre-diabetes (A1c goal <6.5%) -Controlled - A1c 6.3% -Current medications: None  -Medications previously tried: none  -Current home glucose readings - does have a monitor and checks on occasion -Denies hypoglycemic/hyperglycemic symptoms -Current meal patterns: Patient is watching his carbs and sugar intake (white foods). He reports his weight is climbing slowly. He has a good appetite. He eats 3 small meals a day. Breakfast, lunch, snack, dinner. He rates his diet on 6-7 our of healthy scale. Tries to only have french fries once a week. -Educated on A1c and blood sugar goals; -Counseled to check feet daily and get yearly eye exams -Recommended to continue current medication  Atrial Fibrillation (Goal: prevent stroke and major bleeding) -Controlled -CHADSVASC: none -Current treatment: Rate control: Diltiazem 120 mg - 1 tablet daily Anticoagulation: Eliquis 5 mg - 1 BID -Medications previously tried: none -Home BP and HR readings: does not home monitor, he does have HTN per cardiology.   -Counseled on increased risk of stroke due to Afib and benefits of anticoagulation for stroke prevention; bleeding risk associated with Eliquis and importance of self-monitoring for signs/symptoms of bleeding; avoidance of NSAIDs due to increased bleeding risk with anticoagulants; -Recommended to continue current  medication  Patient Goals/Self-Care Activities Patient will:  - take medications as prescribed  Follow Up Plan: Telephone follow up appointment with care management team member scheduled for:  12 months      Medication Assistance: None required.  Patient affirms current coverage meets needs. Reports income above limits for Eliquis assistance.   Compliance/Adherence/Medication fill history: Care Gaps: Diabetic foot and eye exam, A1c due - however, pt is in pre-diabetic range.  Star-Rating Drugs: Medication:  Last Fill:         Day Supply Atorvastatin                 08/07/2021      90   Patient's preferred pharmacy is:  [id number] [place] [place]Phone: [phone]: [phone] Uses pill box? Yes Pt endorses 100% compliance  We discussed: Benefits of medication synchronization, packaging and delivery as well as enhanced pharmacist oversight with [employer]. Patient decided to: Continue current medication management strategy  Care Plan and Follow Up Patient Decision:  Patient agrees to Care Plan and Follow-up.  MDebbora Dus PharmD Clinical Pharmacist [phone]

## 2021-09-10 ENCOUNTER — Other Ambulatory Visit: Payer: Self-pay

## 2021-09-10 ENCOUNTER — Ambulatory Visit (INDEPENDENT_AMBULATORY_CARE_PROVIDER_SITE_OTHER): Payer: Medicare HMO | Admitting: Otolaryngology

## 2021-09-10 DIAGNOSIS — J310 Chronic rhinitis: Secondary | ICD-10-CM | POA: Diagnosis not present

## 2021-09-10 DIAGNOSIS — H938X3 Other specified disorders of ear, bilateral: Secondary | ICD-10-CM

## 2021-09-10 MED ORDER — TRIAMCINOLONE ACETONIDE 55 MCG/ACT NA AERO: 2.0000 | INHALATION_SPRAY | Freq: Every day | NASAL | 12 refills | Status: DC

## 2021-09-10 NOTE — Progress Notes (Signed)
HPI: Keith Jordan is a 80 y.o. male who presents is referred by his PCP for evaluation of complaints of ear pressure that comes and goes.  It seems worse in the morning when he first wakes up but gets better after a few hours.  He wears bilateral hearing aids.  And has not noted any significant change in his hearing. He also complains of "sinus issues" with postnasal drainage and intermittent nasal congestion.Marland Kitchen He is not having ear pressure presently as this comes and goes.  Past Medical History:  Diagnosis Date   Abnormal breath sounds 10/11/2020   Acute sinusitis 01/29/2021   Advanced care planning/counseling discussion 09/01/2016   BPH (benign prostatic hyperplasia) 09/29/2018   Ear fullness, bilateral 04/03/2021   Health maintenance examination 09/01/2016   Hyperlipidemia    Irregular heart beat 03/24/2021   Ischemic optic neuropathy 2006   [place] at [facility]   Leukocytosis 01/30/2021   Severe s/p ER eval 01/2021   Medicare annual wellness visit, subsequent 10/04/2019   Nausea without vomiting 03/24/2021   Personal history of colonic adenoma 07/30/2008   Prediabetes 07/05/2016   A1c 6.5% 03/2014    Prediabetes 07/05/2016   A1c 6.5% 03/2014    Primary osteoarthritis of knee    bilateral s/p L knee replacement, receives R knee injections [name], [name])   Sebaceous cyst 01/30/2020   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/05/2021   Past Surgical History:  Procedure Laterality Date   A-FLUTTER ABLATION N/A 07/11/2021   Procedure: A-FLUTTER ABLATION;  Surgeon: [name], MD;  Location: [facility];  Service: Cardiovascular;  Laterality: N/A;   COLONOSCOPY  12/2013   no polyps, no rpt due [name])   CYST EXCISION  01/2020   TOTAL KNEE ARTHROPLASTY Left 2005   [name]   Social History   Socioeconomic History   Marital status: Married    Spouse name: Not on file   Number of children: Not on file   Years of education: Not on file   Highest education level: Not on file   Occupational History   Not on file  Tobacco Use   Smoking status: Never   Smokeless tobacco: Never  Vaping Use   Vaping Use: Never used  Substance and Sexual Activity   Alcohol use: Yes    Comment: occasional   Drug use: No   Sexual activity: Not Currently  Other Topics Concern   Not on file  Social History Narrative   Lives with wife, 2 cats   Occ: retired Personal assistant   Activity: works in yard   Diet: some water, fruits/vegetables daily   Social Determinants of Radio broadcast assistant Strain: Low Risk    Difficulty of Paying Living Expenses: Not very hard  Food Insecurity: No Food Insecurity   Worried About Charity fundraiser in the Last Year: Never true   Arboriculturist in the Last Year: Never true  Transportation Needs: No Transportation Needs   Lack of Transportation (Medical): No   Lack of Transportation (Non-Medical): No  Physical Activity: Inactive   Days of Exercise per Week: 0 days   Minutes of Exercise per Session: 0 min  Stress: No Stress Concern Present   Feeling of Stress : Not at all  Social Connections: Not on file   Family History  Problem Relation Age of Onset   Blindness Mother 67       ?stroke in eyes   Lung disease Father  coal miner   Arthritis Father    Colon cancer Neg Hx    Pancreatic cancer Neg Hx    Stomach cancer Neg Hx    Esophageal cancer Neg Hx    Rectal cancer Neg Hx    Cancer Neg Hx    Diabetes Neg Hx    CAD Neg Hx    Stroke Neg Hx    No Known Allergies Prior to Admission medications   Medication Sig Start Date End Date Taking? Authorizing Provider  acetaminophen (TYLENOL) 500 MG tablet Take 500 mg by mouth every 6 (six) hours as needed for mild pain.    [provider]  Ascorbic Acid (VITAMIN C) 1000 MG tablet Take 500 mg by mouth 2 (two) times daily.    [provider]  atorvastatin (LIPITOR) 10 MG tablet Take 1 tablet (10 mg total) by mouth daily. 10/11/20   [name], MD   carboxymethylcellulose (REFRESH PLUS) 0.5 % SOLN Place 1 drop into both eyes 2 (two) times daily as needed (dry eyes).    [provider]  diltiazem (CARDIZEM CD) 120 MG 24 hr capsule Take 1 capsule (120 mg total) by mouth daily. 05/21/21 09/08/21  Tobb, Godfrey Pick, DO  Multiple Vitamins-Minerals (MULTIVITAMIN WITH MINERALS) tablet Take 1 tablet by mouth daily. Unknown strenght    [provider]  Multiple Vitamins-Minerals (PRESERVISION AREDS) CAPS Take 1 capsule by mouth in the morning and at bedtime. Patient taking differently: Take 1 capsule by mouth in the morning and at bedtime. Unknown strenght 10/11/20   [name], MD  ondansetron (ZOFRAN) 8 MG tablet Take 1 tablet (8 mg total) by mouth every 8 (eight) hours as needed for nausea or vomiting. 03/24/21   Dede Query T, PA-C  sodium chloride (OCEAN) 0.65 % SOLN nasal spray Place 1 spray into both nostrils as needed for congestion.    [provider]     Positive ROS: Otherwise negative  All other systems have been reviewed and were otherwise negative with the exception of those mentioned in the HPI and as above.  Physical Exam: Constitutional: Alert, well-appearing, no acute distress Ears: External ears without lesions or tenderness. Ear canals are clear bilaterally with intact, clear TMs with normal mobility on pneumatic otoscopy.  Both middle ear spaces are clear.  Hearing screening with the tuning forks revealed AC > BC bilaterally. Nasal: External nose without lesions. Septum mild septal deformity with moderate rhinitis.  After decongesting the nose both middle meatus regions were clear with no evidence of active infection or purulent discharge.  No polyps noted.  Oral: Lips and gums without lesions. Tongue and palate mucosa without lesions. Posterior oropharynx clear.  Tonsil regions appear benign bilaterally.  Indirect laryngoscopy revealed a clear base of tongue vallecula and epiglottis. Neck: No  palpable adenopathy or masses.  He does have some TMJ dysfunction with mild discomfort in the TMJ area. Respiratory: Breathing comfortably  Skin: No facial/neck lesions or rash noted.  Procedures  Assessment: Normal ear canal and TM examination with a clear middle ear space bilaterally.  Questionable etiology of "ear pressure". Could possibly be related to TMJ dysfunction versus rhinitis and eustachian tube dysfunction but no middle ear abnormality noted on microscopic exam.  Plan: Suggested regular use of Nasacort 2 sprays each nostril at night as this should help with nasal sinus issues as well as possibly help with the ear pressure.   Radene Journey, MD   CC:

## 2021-09-26 DIAGNOSIS — E782 Mixed hyperlipidemia: Secondary | ICD-10-CM | POA: Diagnosis not present

## 2021-10-02 NOTE — Patient Instructions (Signed)
Dear Keith Jordan,  It was a pleasure meeting you during our initial appointment on Sept. 12, 2022 . Below is a summary of the goals we discussed and components of chronic care management. Please contact me anytime with questions or concerns.   Visit Information  Patient Care Plan: CCM Pharmacy Care Plan     Problem Identified: CHL AMB "PATIENT-SPECIFIC PROBLEM"      Long-Range Goal: Disease Management   Start Date: 09/08/2021  Priority: High  Note:   Current Barriers:  None identified  Pharmacist Clinical Goal(s):  Patient will contact provider office for questions/concerns as evidenced notation of same in electronic health record through collaboration with PharmD and provider.   Interventions: 1:1 collaboration with Keith Bush, MD regarding development and update of comprehensive plan of care as evidenced by provider attestation and co-signature Inter-disciplinary care team collaboration (see longitudinal plan of care) Comprehensive medication review performed; medication list updated in electronic medical record  Hyperlipidemia: (LDL goal < 100) -Controlled - LDL 71 -Current treatment: Atorvastatin 10 mg - 1 tablet daily -Medications previously tried: none  -Current dietary patterns: see diabetes section -Current exercise habits: Yes - Patient will walk around the mall or [place]. He is exercising more now that he had the Ablation done. He mowed lawn last week. Lives with wife, she does cooking. -Denies any problem -Educated on Denies any side effects -Recommended to continue current medication  Pre-diabetes (A1c goal <6.5%) -Controlled - A1c 6.3% -Current medications: None  -Medications previously tried: none  -Current home glucose readings - does have a monitor and checks on occasion -Denies hypoglycemic/hyperglycemic symptoms -Current meal patterns: Patient is watching his carbs and sugar intake (white foods). He reports his weight is climbing slowly. He has  a good appetite. He eats 3 small meals a day. Breakfast, lunch, snack, dinner. He rates his diet on 6-7 our of healthy scale. Tries to only have french fries once a week. -Educated on A1c and blood sugar goals; -Counseled to check feet daily and get yearly eye exams -Recommended to continue current medication  Atrial Fibrillation (Goal: prevent stroke and major bleeding) -Controlled -CHADSVASC: none -Current treatment: Rate control: Diltiazem 120 mg - 1 tablet daily Anticoagulation: Eliquis 5 mg - 1 BID -Medications previously tried: none -Home BP and HR readings: does not home monitor, he does have HTN per cardiology.   -Counseled on increased risk of stroke due to Afib and benefits of anticoagulation for stroke prevention; bleeding risk associated with Eliquis and importance of self-monitoring for signs/symptoms of bleeding; avoidance of NSAIDs due to increased bleeding risk with anticoagulants; -Recommended to continue current medication  Patient Goals/Self-Care Activities Patient will:  - take medications as prescribed  Follow Up Plan: Telephone follow up appointment with care management team member scheduled for:  12 months     Keith Jordan was given information about Chronic Care Management services today including:  CCM service includes personalized support from designated clinical staff supervised by his physician, including individualized plan of care and coordination with other care providers 24/7 contact phone numbers for assistance for urgent and routine care needs. Standard insurance, coinsurance, copays and deductibles apply for chronic care management only during months in which we provide at least 20 minutes of these services. Most insurances cover these services at 100%, however patients may be responsible for any copay, coinsurance and/or deductible if applicable. This service may help you avoid the need for more expensive face-to-face services. Only one practitioner  may furnish and bill the service  in a calendar month. The patient may stop CCM services at any time (effective at the end of the month) by phone call to the office staff.  Patient agreed to services and verbal consent obtained.   Keith Jordan, PharmD Clinical Pharmacist [facility] [phone]

## 2021-10-13 ENCOUNTER — Telehealth: Payer: Self-pay | Admitting: Radiology

## 2021-10-13 ENCOUNTER — Encounter: Payer: Self-pay | Admitting: Family Medicine

## 2021-10-13 ENCOUNTER — Other Ambulatory Visit: Payer: Self-pay

## 2021-10-13 ENCOUNTER — Ambulatory Visit (INDEPENDENT_AMBULATORY_CARE_PROVIDER_SITE_OTHER): Payer: Medicare HMO | Admitting: Family Medicine

## 2021-10-13 VITALS — BP 136/62 | HR 72 | Temp 97.4°F | Ht 68.0 in | Wt 153.2 lb

## 2021-10-13 DIAGNOSIS — R202 Paresthesia of skin: Secondary | ICD-10-CM | POA: Diagnosis not present

## 2021-10-13 DIAGNOSIS — R351 Nocturia: Secondary | ICD-10-CM

## 2021-10-13 DIAGNOSIS — E782 Mixed hyperlipidemia: Secondary | ICD-10-CM

## 2021-10-13 DIAGNOSIS — R739 Hyperglycemia, unspecified: Secondary | ICD-10-CM | POA: Diagnosis not present

## 2021-10-13 DIAGNOSIS — R209 Unspecified disturbances of skin sensation: Secondary | ICD-10-CM | POA: Diagnosis not present

## 2021-10-13 DIAGNOSIS — R7303 Prediabetes: Secondary | ICD-10-CM | POA: Diagnosis not present

## 2021-10-13 DIAGNOSIS — I483 Typical atrial flutter: Secondary | ICD-10-CM | POA: Diagnosis not present

## 2021-10-13 DIAGNOSIS — C8307 Small cell B-cell lymphoma, spleen: Secondary | ICD-10-CM

## 2021-10-13 DIAGNOSIS — Z23 Encounter for immunization: Secondary | ICD-10-CM | POA: Diagnosis not present

## 2021-10-13 DIAGNOSIS — Z7189 Other specified counseling: Secondary | ICD-10-CM

## 2021-10-13 DIAGNOSIS — Z0000 Encounter for general adult medical examination without abnormal findings: Secondary | ICD-10-CM

## 2021-10-13 DIAGNOSIS — N401 Enlarged prostate with lower urinary tract symptoms: Secondary | ICD-10-CM | POA: Diagnosis not present

## 2021-10-13 LAB — LIPID PANEL
Cholesterol: 150 mg/dL (ref 0–200)
HDL: 35.3 mg/dL — ABNORMAL LOW (ref >39.00)
LDL Cholesterol: 81 mg/dL (ref 0–99)
NonHDL: 114.49
Total CHOL/HDL Ratio: 4
Triglycerides: 168 mg/dL — ABNORMAL HIGH (ref 0.0–149.0)
VLDL: 33.6 mg/dL (ref 0.0–40.0)

## 2021-10-13 LAB — CBC WITH DIFFERENTIAL/PLATELET
Basophils Absolute: 0.1 10*3/uL (ref 0.0–0.1)
Basophils Relative: 0.1 % (ref 0.0–3.0)
Eosinophils Absolute: 0.4 10*3/uL (ref 0.0–0.7)
Eosinophils Relative: 0.4 % (ref 0.0–5.0)
HCT: 44.8 % (ref 39.0–52.0)
Hemoglobin: 14.2 g/dL (ref 13.0–17.0)
Lymphocytes Relative: 93.1 % — ABNORMAL HIGH (ref 12.0–46.0)
Lymphs Abs: 92.4 10*3/uL — ABNORMAL HIGH (ref 0.7–4.0)
MCHC: 31.7 g/dL (ref 30.0–36.0)
MCV: 97.7 fl (ref 78.0–100.0)
Monocytes Absolute: 1.4 10*3/uL — ABNORMAL HIGH (ref 0.1–1.0)
Monocytes Relative: 1.4 % — ABNORMAL LOW (ref 3.0–12.0)
Neutro Abs: 5 10*3/uL (ref 1.4–7.7)
Neutrophils Relative %: 5 % — ABNORMAL LOW (ref 43.0–77.0)
Platelets: 136 10*3/uL — ABNORMAL LOW (ref 150.0–400.0)
RBC: 4.59 Mil/uL (ref 4.22–5.81)
RDW: 14.7 % (ref 11.5–15.5)
WBC: 99.3 10*3/uL (ref 4.0–10.5)

## 2021-10-13 LAB — BASIC METABOLIC PANEL
BUN: 25 mg/dL — ABNORMAL HIGH (ref 6–23)
CO2: 32 mEq/L (ref 19–32)
Calcium: 9.7 mg/dL (ref 8.4–10.5)
Chloride: 100 mEq/L (ref 96–112)
Creatinine, Ser: 1.2 mg/dL (ref 0.40–1.50)
GFR: 57.49 mL/min — ABNORMAL LOW (ref >60.00)
Glucose, Bld: 117 mg/dL — ABNORMAL HIGH (ref 70–99)
Potassium: 4.1 mEq/L (ref 3.5–5.1)
Sodium: 140 mEq/L (ref 135–145)

## 2021-10-13 LAB — VITAMIN B12: Vitamin B-12: 496 pg/mL (ref 211–911)

## 2021-10-13 LAB — POCT GLYCOSYLATED HEMOGLOBIN (HGB A1C): Hemoglobin A1C: 6.4 % — AB (ref 4.0–5.6)

## 2021-10-13 LAB — FOLATE: Folate: >23.4 ng/mL (ref >5.9)

## 2021-10-13 MED ORDER — ATORVASTATIN CALCIUM 10 MG PO TABS: 10.0000 mg | ORAL_TABLET | Freq: Every day | ORAL | 3 refills | Status: DC

## 2021-10-13 NOTE — Telephone Encounter (Signed)
Pt seen in office today. Mentioned he never head back about f/u plan for increasing palpitations, now back on eliquis. I told him I would reach out to your office. Thanks in advance!

## 2021-10-13 NOTE — Telephone Encounter (Signed)
[facility] called a critical result, WBC 99.3, results given to Dr Danise Mina

## 2021-10-13 NOTE — Assessment & Plan Note (Signed)
Abnormal sensation of heat to lower back with prolonged sitting, improves when he stands up. He also feels some of the chest discomfort is radiation of this abnormal back sensation. Normal skin exam to back. Will start eval for vitamin deficiencies as possible cause. Consider compressive neuropathy from spine.

## 2021-10-13 NOTE — Assessment & Plan Note (Addendum)
Advanced directive discussion - scanned 2022. Son KAVONTAE PRITCHARD is HCPOA then [name] and [employer] . Grants discretion to [employer] regarding life prolonging measures. Would want to be full code but wouldn't want prolonged life support if terminal condition. Doesn't know about feeding tube, leaning against this.

## 2021-10-13 NOTE — Patient Instructions (Addendum)
Labs and flu shot today  Call to schedule planned follow up with cardiologist.  A1c today remains in prediabetes range, borderline diabetes. Work on low sugar/low carb diet.  Return as needed or in 1 year for next physical/wellness visit.   Health Maintenance After Age 79 After age 75, you are at a higher risk for certain long-term diseases and infections as well as injuries from falls. Falls are a major cause of broken bones and head injuries in people who are older than age 42. Getting regular preventive care can help to keep you healthy and well. Preventive care includes getting regular testing and making lifestyle changes as recommended by your health care provider. Talk with your health care provider about: Which screenings and tests you should have. A screening is a test that checks for a disease when you have no symptoms. A diet and exercise plan that is right for you. What should I know about screenings and tests to prevent falls? Screening and testing are the best ways to find a health problem early. Early diagnosis and treatment give you the best chance of managing medical conditions that are common after age 46. Certain conditions and lifestyle choices may make you more likely to have a fall. Your health care provider may recommend: Regular vision checks. Poor vision and conditions such as cataracts can make you more likely to have a fall. If you wear glasses, make sure to get your prescription updated if your vision changes. Medicine review. Work with your health care provider to regularly review all of the medicines you are taking, including over-the-counter medicines. Ask your health care provider about any side effects that may make you more likely to have a fall. Tell your health care provider if any medicines that you take make you feel dizzy or sleepy. Osteoporosis screening. Osteoporosis is a condition that causes the bones to get weaker. This can make the bones weak and cause them to  break more easily. Blood pressure screening. Blood pressure changes and medicines to control blood pressure can make you feel dizzy. Strength and balance checks. Your health care provider may recommend certain tests to check your strength and balance while standing, walking, or changing positions. Foot health exam. Foot pain and numbness, as well as not wearing proper footwear, can make you more likely to have a fall. Depression screening. You may be more likely to have a fall if you have a fear of falling, feel emotionally low, or feel unable to do activities that you used to do. Alcohol use screening. Using too much alcohol can affect your balance and may make you more likely to have a fall. What actions can I take to lower my risk of falls? General instructions Talk with your health care provider about your risks for falling. Tell your health care provider if: You fall. Be sure to tell your health care provider about all falls, even ones that seem minor. You feel dizzy, sleepy, or off-balance. Take over-the-counter and prescription medicines only as told by your health care provider. These include any supplements. Eat a healthy diet and maintain a healthy weight. A healthy diet includes low-fat dairy products, low-fat (lean) meats, and fiber from whole grains, beans, and lots of fruits and vegetables. Home safety Remove any tripping hazards, such as rugs, cords, and clutter. Install safety equipment such as grab bars in bathrooms and safety rails on stairs. Keep rooms and walkways well-lit. Activity  Follow a regular exercise program to stay fit. This will help  you maintain your balance. Ask your health care provider what types of exercise are appropriate for you. If you need a cane or walker, use it as recommended by your health care provider. Wear supportive shoes that have nonskid soles. Lifestyle Do not drink alcohol if your health care provider tells you not to drink. If you drink  alcohol, limit how much you have: 0-1 drink a day for women. 0-2 drinks a day for men. Be aware of how much alcohol is in your drink. In the [place] one drink equals one typical bottle of beer (12 oz), one-half glass of wine (5 oz), or one shot of hard liquor (1 oz). Do not use any products that contain nicotine or tobacco, such as cigarettes and e-cigarettes. If you need help quitting, ask your health care provider. Summary Having a healthy lifestyle and getting preventive care can help to protect your health and wellness after age 63. Screening and testing are the best way to find a health problem early and help you avoid having a fall. Early diagnosis and treatment give you the best chance for managing medical conditions that are more common for people who are older than age 58. Falls are a major cause of broken bones and head injuries in people who are older than age 3. Take precautions to prevent a fall at home. Work with your health care provider to learn what changes you can make to improve your health and wellness and to prevent falls. This information is not intended to replace advice given to you by your health care provider. Make sure you discuss any questions you have with your health care provider. Document Revised: 02/21/2021 Document Reviewed: 11/29/2020 Elsevier Patient Education  2022 [employer].

## 2021-10-13 NOTE — Assessment & Plan Note (Signed)

## 2021-10-13 NOTE — Telephone Encounter (Signed)
Noted. Chronic.  

## 2021-10-13 NOTE — Assessment & Plan Note (Signed)
Appreciate onc care. S/p 4 wk rituximab monotherapy.

## 2021-10-13 NOTE — Assessment & Plan Note (Addendum)
Elevated sugar readings noted recently reviewing EMR. Check A1c today = 6.4%. ?falsely low given concomitant anemia - check fructosamine. Encouraged he work on low sugar low carb diet.

## 2021-10-13 NOTE — Progress Notes (Signed)
Patient ID: Keith Jordan, male    DOB: [birth year], 80 y.o.   MRN: [id number]  This visit was conducted in person.  BP 136/62   Pulse 72   Temp (!) 97.4 F (36.3 C) (Temporal)   Ht 5\' 8"  (1.727 m)   Wt 153 lb 3 oz (69.5 kg)   SpO2 97%   BMI 23.29 kg/m    CC: AMW Subjective:   HPI: Keith Jordan is a 80 y.o. male presenting on 10/13/2021 for Medicare Wellness   Did not see health advisor this year.   Hearing Screening - Comments:: Wears bilateral hearing aids.  Wearing at today's OV.  Vision Screening - Comments:: Last eye exam, 09/2020.  [place] Office Visit from 10/13/2021 in [place] at [facility]  PHQ-2 Total Score 0       Fall Risk  10/13/2021 10/04/2020 01/23/2020 10/04/2019 09/26/2018  Falls in the past year? 0 0 0 0 No  Number falls in past yr: - 0 0 - -  Injury with Fall? - 0 0 - -  Risk for fall due to : - Medication side effect - - -  Follow up - Falls evaluation completed;Falls prevention discussed - - -    Splenic marginal zone lymphoma (NHL subtype) dx 01/2021 by [facility] s/p Rituximab monotherapy x4 wks followed by oncology Dr Lorenso Courier. Incidentally found with WBC to 183k when [facility] ordered CBC for palpitations.   Typical Aflutter found on Zio monitor use s/p successful ablation 06/2021 followed by cardiologist Dr [name] and EP Dr Curt Bears. Eliquis was stopped, diltiazem continued. Recurrent tachypalpitations 08/2021 so he's restarted eliquis - pending cardiology/EP f/u for further steps. COVID infection 06/2021. Notes decreased stamina since then and possibly increase in palpitations.   Notes L>R mid to lower back heating up with prolonged sitting but can occur after 10 min. This may have started after Rituximab infusions. Standing up alleviates pain. No paresthesias, numbness. Occasional neck discomfort. Tylenol hasn't helped. He feels better when he's more active. Notes ongoing intermittent chest discomfort that seems to radiate from back when heat  starts.   Recent elevated sugar readings. No recent prednisone. No blurry vision, paresthesias. Notes polyruia, however no polydipsia or polyphagia.   Preventative: COLONOSCOPY Date: 12/2013 no polyps, no rpt due [name]) Prostate cancer screening - h/o BPH. Nocturia x2-3. Denies trouble with stream. Discussed, will stop screening.  Lung cancer screening - never smoker  Flu shot yearly [place] 01/2020 x2, no booster  Tetanus shot unsure  Prevnar-13 2017, pneumovax 2018 Shingrix - discussed - unaffordable. He had mild shingles in the past.  Advanced directive discussion - scanned 2022. Son [name] is HCPOA then [name] and [employer] . Grants discretion to [employer] regarding life prolonging measures. Would want to be full code but wouldn't want prolonged life support if terminal condition. Doesn't know about feeding tube, leaning against this.  Seat belt use discussed  Sunscreen use discussed, no changing moles on skin  Non smoker  Alcohol use - none  Dentist Q70mo  Eye exam yearly  Bowel - no constipation  Bladder - no incontinence   Lives with wife, 2 cats  Occ: retired Personal assistant  Activity: works in yard, new stationary bicycle  Diet: some water, likes flavored sparkling water, fruits/vegetables daily      Relevant past medical, surgical, family and social history reviewed and updated as indicated. Interim medical history since our last visit reviewed. Allergies and medications reviewed and  updated. Outpatient Medications Prior to Visit  Medication Sig Dispense Refill   acetaminophen (TYLENOL) 500 MG tablet Take 500 mg by mouth every 6 (six) hours as needed for mild pain.     Ascorbic Acid (VITAMIN C) 1000 MG tablet Take 500 mg by mouth 2 (two) times daily.     carboxymethylcellulose (REFRESH PLUS) 0.5 % SOLN Place 1 drop into both eyes 2 (two) times daily as needed (dry eyes).     diltiazem (CARDIZEM CD) 120 MG 24 hr capsule Take 1 capsule (120 mg total)  by mouth daily. 90 capsule 3   ELIQUIS 5 MG TABS tablet Take 5 mg by mouth 2 (two) times daily.     Multiple Vitamins-Minerals (MULTIVITAMIN WITH MINERALS) tablet Take 1 tablet by mouth daily. Unknown strenght     Multiple Vitamins-Minerals (PRESERVISION AREDS) CAPS Take 1 capsule by mouth in the morning and at bedtime. (Patient taking differently: Take 1 capsule by mouth in the morning and at bedtime. Unknown strenght) 180 capsule 3   ondansetron (ZOFRAN) 8 MG tablet Take 1 tablet (8 mg total) by mouth every 8 (eight) hours as needed for nausea or vomiting. 30 tablet 0   sodium chloride (OCEAN) 0.65 % SOLN nasal spray Place 1 spray into both nostrils as needed for congestion.     triamcinolone (NASACORT) 55 MCG/ACT AERO nasal inhaler Place 2 sprays into the nose daily. 2 sprays each nostril at night 1 each 12   atorvastatin (LIPITOR) 10 MG tablet Take 1 tablet (10 mg total) by mouth daily. 90 tablet 3   No facility-administered medications prior to visit.     Per HPI unless specifically indicated in ROS section below Review of Systems  Constitutional:  Negative for activity change, appetite change, chills, fatigue, fever and unexpected weight change.  HENT:  Negative for hearing loss.   Eyes:  Negative for visual disturbance.  Respiratory:  Negative for cough, chest tightness, shortness of breath and wheezing.   Cardiovascular:  Negative for chest pain, palpitations and leg swelling.  Gastrointestinal:  Negative for abdominal distention, abdominal pain, blood in stool, constipation, diarrhea, nausea and vomiting.  Genitourinary:  Negative for difficulty urinating and hematuria.  Musculoskeletal:  Negative for arthralgias, myalgias and neck pain.  Skin:  Negative for rash.  Neurological:  Negative for dizziness, seizures, syncope and headaches.  Hematological:  Negative for adenopathy. Does not bruise/bleed easily.  Psychiatric/Behavioral:  Negative for dysphoric mood. The patient is not  nervous/anxious.    Objective:  BP 136/62   Pulse 72   Temp (!) 97.4 F (36.3 C) (Temporal)   Ht 5\' 8"  (1.727 m)   Wt 153 lb 3 oz (69.5 kg)   SpO2 97%   BMI 23.29 kg/m   Wt Readings from Last 3 Encounters:  10/13/21 153 lb 3 oz (69.5 kg)  09/03/21 156 lb 4.8 oz (70.9 kg)  08/12/21 153 lb (69.4 kg)      Physical Exam Vitals and nursing note reviewed.  Constitutional:      General: He is not in acute distress.    Appearance: Normal appearance. He is well-developed. He is not ill-appearing.  HENT:     Head: Normocephalic and atraumatic.     Right Ear: Hearing, tympanic membrane, ear canal and external ear normal.     Left Ear: Hearing, tympanic membrane, ear canal and external ear normal.  Eyes:     General: No scleral icterus.    Extraocular Movements: Extraocular movements intact.     Conjunctiva/sclera:  Conjunctivae normal.     Pupils: Pupils are equal, round, and reactive to light.  Neck:     Thyroid: No thyroid mass or thyromegaly.     Vascular: No carotid bruit.  Cardiovascular:     Rate and Rhythm: Normal rate and regular rhythm.     Pulses: Normal pulses.          Radial pulses are 2+ on the right side and 2+ on the left side.     Heart sounds: Normal heart sounds. No murmur heard. Pulmonary:     Effort: Pulmonary effort is normal. No respiratory distress.     Breath sounds: Normal breath sounds. No wheezing, rhonchi or rales.  Abdominal:     General: Bowel sounds are normal. There is no distension.     Palpations: Abdomen is soft. There is no mass.     Tenderness: There is no abdominal tenderness. There is no guarding or rebound.     Hernia: No hernia is present.  Musculoskeletal:        General: Normal range of motion.     Cervical back: Normal range of motion and neck supple.     Right lower leg: No edema.     Left lower leg: No edema.  Lymphadenopathy:     Cervical: No cervical adenopathy.  Skin:    General: Skin is warm and dry.     Findings: No  rash.  Neurological:     General: No focal deficit present.     Mental Status: He is alert and oriented to person, place, and time.     Comments:  Recall 3/3 Calculation 4/5 DLOW  Psychiatric:        Mood and Affect: Mood normal.        Behavior: Behavior normal.        Thought Content: Thought content normal.        Judgment: Judgment normal.      Results for orders placed or performed in visit on 10/13/21  POCT glycosylated hemoglobin (Hb A1C)  Result Value Ref Range   Hemoglobin A1C 6.4 (A) 4.0 - 5.6 %   HbA1c POC (<> result, manual entry)     HbA1c, POC (prediabetic range)     HbA1c, POC (controlled diabetic range)      Assessment & Plan:  This visit occurred during the SARS-CoV-2 public health emergency.  Safety protocols were in place, including screening questions prior to the visit, additional usage of staff PPE, and extensive cleaning of exam room while observing appropriate contact time as indicated for disinfecting solutions.   Problem List Items Addressed This Visit     Health maintenance examination (Chronic)    Preventative protocols reviewed and updated unless pt declined. Discussed healthy diet and lifestyle.       Advanced care planning/counseling discussion (Chronic)    Advanced directive discussion - scanned 2022. Son ASAAD GULLEY is HCPOA then [name] and [employer] . Grants discretion to [employer] regarding life prolonging measures. Would want to be full code but wouldn't want prolonged life support if terminal condition. Doesn't know about feeding tube, leaning against this.       Medicare annual wellness visit, subsequent - Primary (Chronic)    I have personally reviewed the Medicare Annual Wellness questionnaire and have noted 1. The patient's medical and social history 2. Their use of alcohol, tobacco or illicit drugs 3. Their current medications and supplements 4. The patient's functional ability including ADL's, fall risks, home safety risks  and hearing or visual impairment. Cognitive function has been assessed and addressed as indicated.  5. Diet and physical activity 6. Evidence for depression or mood disorders The patients weight, height, BMI have been recorded in the chart. I have made referrals, counseling and provided education to the patient based on review of the above and I have provided the pt with a written personalized care plan for preventive services. Provider list updated.. See scanned questionairre as needed for further documentation. Reviewed preventative protocols and updated unless pt declined.       Hyperlipidemia    Update FLP on low dose atorvastatin 10mg  The ASCVD Risk score (Arnett DK, et al., 2019) failed to calculate for the following reasons:   The valid total cholesterol range is 130 to 320 mg/dL       Relevant Medications   ELIQUIS 5 MG TABS tablet   atorvastatin (LIPITOR) 10 MG tablet   Other Relevant Orders   Lipid panel   Basic metabolic panel   Prediabetes    Elevated sugar readings noted recently reviewing EMR. Check A1c today = 6.4%. ?falsely low given concomitant anemia - check fructosamine. Encouraged he work on low sugar low carb diet.       Relevant Orders   Basic metabolic panel   Fructosamine   BPH (benign prostatic hyperplasia)    Stable symptoms, PSA previously normal. Discussed stopping prostate cancer screening.       Splenic marginal zone b-cell lymphoma ([place])    Appreciate onc care. S/p 4 wk rituximab monotherapy.       Relevant Orders   CBC with Differential/Platelet   Typical atrial flutter ([place])    S/p ablation 06/2021 continues eliquis and diltiazem 120mg  daily. Needs to schedule f/u with cardiology.  I sent message to EP office regarding plan for recurrence of palpations. Sounds regular today. Chest discomfort endorsed does not sound cardiac in nature.       Relevant Medications   ELIQUIS 5 MG TABS tablet   atorvastatin (LIPITOR) 10 MG tablet   Skin  sensation disturbance    Abnormal sensation of heat to lower back with prolonged sitting, improves when he stands up. He also feels some of the chest discomfort is radiation of this abnormal back sensation. Normal skin exam to back. Will start eval for vitamin deficiencies as possible cause. Consider compressive neuropathy from spine.       Other Visit Diagnoses     Need for influenza vaccination       Relevant Orders   Flu Vaccine QUAD High Dose(Fluad) (Completed)   Paresthesia       Relevant Orders   Vitamin B12   Folate   Vitamin B1   Hyperglycemia       Relevant Orders   POCT glycosylated hemoglobin (Hb A1C) (Completed)        Meds ordered this encounter  Medications   atorvastatin (LIPITOR) 10 MG tablet    Sig: Take 1 tablet (10 mg total) by mouth daily.    Dispense:  90 tablet    Refill:  3    Orders Placed This Encounter  Procedures   Flu Vaccine QUAD High Dose(Fluad)   Lipid panel   Basic metabolic panel   Vitamin W41   Folate   Vitamin B1   Fructosamine   CBC with Differential/Platelet   POCT glycosylated hemoglobin (Hb A1C)     Patient instructions: Labs and flu shot today  Call to schedule planned follow up with cardiologist.  A1c today remains  in prediabetes range, borderline diabetes. Work on low sugar/low carb diet.  Return as needed or in 1 year for next physical/wellness visit.   Follow up plan: Return in about 6 months (around 04/13/2022) for follow up visit.  [name], MD

## 2021-10-13 NOTE — Assessment & Plan Note (Signed)
Update FLP on low dose atorvastatin 10mg  The ASCVD Risk score (Arnett DK, et al., 2019) failed to calculate for the following reasons:   The valid total cholesterol range is 130 to 320 mg/dL

## 2021-10-13 NOTE — Assessment & Plan Note (Addendum)
S/p ablation 06/2021 continues eliquis and diltiazem 120mg  daily. Needs to schedule f/u with cardiology.  I sent message to EP office regarding plan for recurrence of palpations. Sounds regular today. Chest discomfort endorsed does not sound cardiac in nature.

## 2021-10-13 NOTE — Assessment & Plan Note (Signed)
Stable symptoms, PSA previously normal. Discussed stopping prostate cancer screening.

## 2021-10-13 NOTE — Assessment & Plan Note (Signed)
Preventative protocols reviewed and updated unless pt declined. Discussed healthy diet and lifestyle.  

## 2021-10-16 ENCOUNTER — Telehealth: Payer: Self-pay | Admitting: Cardiology

## 2021-10-16 DIAGNOSIS — R002 Palpitations: Secondary | ICD-10-CM

## 2021-10-16 MED ORDER — DILTIAZEM HCL ER COATED BEADS 180 MG PO CP24: 180.0000 mg | ORAL_CAPSULE | Freq: Every day | ORAL | 2 refills | Status: DC

## 2021-10-16 NOTE — Telephone Encounter (Signed)
Pt aware that we will order a 2 week monitor to assess what is occurring and if r/t heart. Pt does report he is doing better but still having some issues.  Pt would like to come to the office to have monitor placed on, aware will have office call to arrange this visit asap being this issue is overdue.  Aware Dr. Harriet Masson recommends increasing Diltiazem to 180 mg once daily.  Pt agreeable to this and will send Rx to local pharmacy.  Pt understands that it may be up to 2 weeks after returning monitor before hearing back about the result. Aware will determine follow up needs after monitor completed/reviewed. He is agreeable to plan.

## 2021-10-16 NOTE — Addendum Note (Signed)
Addended by: Stanton Kidney on: 10/16/2021 04:05 PM   Modules accepted: Orders

## 2021-10-16 NOTE — Telephone Encounter (Signed)
Please let the patient know will be best to increase his Cardizem to 180 mg daily.

## 2021-10-16 NOTE — Telephone Encounter (Signed)
Heart done procedure done in July and it didn't work.  He is having trouble sleeping at night now.  He thinks it's heart related.  He feeling his heart skips every once in awhile.  He would like to have second opinion if it's heart that keeping him up at night.

## 2021-10-16 NOTE — Telephone Encounter (Signed)
Returned call to patient. He states he is being awakened out of a sound sleep almost nightly with a pounding heart beat and chest discomfort. Reports he is always able to go back to sleep, although sometimes it takes him a long time. He initially called to report these symptoms in early September and never heard back from our office; symptoms continue to occur almost every night. I advised him that I will forward message to Dr. Harriet Masson today since Dr. Curt Bears is out of the office regarding getting a cardiac monitor placed for evaluation of palpitations. He verbalized understanding and agreement and thanked me for the call.

## 2021-10-17 ENCOUNTER — Encounter: Payer: Self-pay | Admitting: General Surgery

## 2021-10-17 ENCOUNTER — Ambulatory Visit (INDEPENDENT_AMBULATORY_CARE_PROVIDER_SITE_OTHER): Payer: Medicare HMO

## 2021-10-17 ENCOUNTER — Other Ambulatory Visit: Payer: Self-pay

## 2021-10-17 DIAGNOSIS — R002 Palpitations: Secondary | ICD-10-CM

## 2021-10-17 LAB — VITAMIN B1: Vitamin B1 (Thiamine): 25 nmol/L (ref 8–30)

## 2021-10-17 LAB — FRUCTOSAMINE: Fructosamine: 306 umol/L — ABNORMAL HIGH (ref 205–285)

## 2021-10-17 NOTE — Progress Notes (Unsigned)
Applied a 14 day Zio XT monitor to patient in the office ?

## 2021-10-21 ENCOUNTER — Other Ambulatory Visit: Payer: Self-pay | Admitting: Family Medicine

## 2021-10-21 DIAGNOSIS — G8929 Other chronic pain: Secondary | ICD-10-CM

## 2021-10-29 ENCOUNTER — Ambulatory Visit (INDEPENDENT_AMBULATORY_CARE_PROVIDER_SITE_OTHER)
Admission: RE | Admit: 2021-10-29 | Discharge: 2021-10-29 | Disposition: A | Discharge status: Home / Self Care | Payer: Medicare HMO | Source: Ambulatory Visit | Attending: Family Medicine | Admitting: Family Medicine

## 2021-10-29 DIAGNOSIS — M545 Low back pain, unspecified: Secondary | ICD-10-CM

## 2021-10-29 DIAGNOSIS — G8929 Other chronic pain: Secondary | ICD-10-CM

## 2021-11-03 ENCOUNTER — Telehealth: Payer: Self-pay | Admitting: Family Medicine

## 2021-11-03 DIAGNOSIS — R002 Palpitations: Secondary | ICD-10-CM | POA: Diagnosis not present

## 2021-11-03 NOTE — Telephone Encounter (Signed)
Pt called in stating he is having a reaction to the medication Nasacort. Pt is losing his balance and want to know if there is another medication he can take. Pt states when he does not take the medication his balance is fine

## 2021-11-04 NOTE — Telephone Encounter (Signed)
Dr Lucia Gaskins ENT initially prescribed this. Recommend stay off nasacort.  Could try OTC flonase different nasal steroid. Let us know if similar effect.

## 2021-11-04 NOTE — Telephone Encounter (Signed)
Spoke with pt relaying Dr. Synthia Innocent message.  Pt verbalizes understanding and states Dr. Lucia Gaskins retired so pt was told to contact PCP.

## 2021-11-14 ENCOUNTER — Telehealth: Payer: Self-pay | Admitting: Cardiology

## 2021-11-14 ENCOUNTER — Telehealth: Payer: Self-pay | Admitting: Family Medicine

## 2021-11-14 NOTE — Telephone Encounter (Signed)
Patient called in to see if the office has the results of his heart monitor.

## 2021-11-14 NOTE — Telephone Encounter (Signed)
Pt called in requesting a call back to discuss Sleep Apnea [phone]

## 2021-11-14 NOTE — Telephone Encounter (Signed)
Returned pt's call.  States he recently saw cards about waking up several times during the night. Wore a heart monitor but showed heart is fine.  Told to see PCP.  Pt scheduled on 11/17/21 at 11:00.

## 2021-11-14 NOTE — Telephone Encounter (Signed)
Pt made aware of monitor findings. Pt verbalized understanding.

## 2021-11-17 ENCOUNTER — Other Ambulatory Visit: Payer: Self-pay

## 2021-11-17 ENCOUNTER — Ambulatory Visit (INDEPENDENT_AMBULATORY_CARE_PROVIDER_SITE_OTHER): Payer: Medicare HMO | Admitting: Family Medicine

## 2021-11-17 ENCOUNTER — Encounter: Payer: Self-pay | Admitting: Family Medicine

## 2021-11-17 VITALS — BP 144/66 | HR 69 | Temp 97.5°F | Ht 68.0 in | Wt 149.4 lb

## 2021-11-17 DIAGNOSIS — I483 Typical atrial flutter: Secondary | ICD-10-CM | POA: Diagnosis not present

## 2021-11-17 DIAGNOSIS — R030 Elevated blood-pressure reading, without diagnosis of hypertension: Secondary | ICD-10-CM | POA: Insufficient documentation

## 2021-11-17 DIAGNOSIS — R351 Nocturia: Secondary | ICD-10-CM | POA: Diagnosis not present

## 2021-11-17 DIAGNOSIS — G4700 Insomnia, unspecified: Secondary | ICD-10-CM | POA: Diagnosis not present

## 2021-11-17 LAB — POC URINALSYSI DIPSTICK (AUTOMATED)
Bilirubin, UA: NEGATIVE
Glucose, UA: NEGATIVE
Ketones, UA: NEGATIVE
Leukocytes, UA: NEGATIVE
Nitrite, UA: NEGATIVE
Protein, UA: POSITIVE — AB
Spec Grav, UA: 1.02 (ref 1.010–1.025)
Urobilinogen, UA: 0.2 E.U./dL
pH, UA: 5 (ref 5.0–8.0)

## 2021-11-17 NOTE — Assessment & Plan Note (Addendum)
Describes sleep maintenance insomnia over the past several months - possibly temporally related to when diltiazem was started (04/2021) as he notes worsening sleep disturbances when he increased dose to 180mg .  Sleep hygiene measures reviewed, handout provided. Discussed melatonin trial. Update me with effect. To touch base with cards if bothersome to the point of wanting to try different for atrial flutter.

## 2021-11-17 NOTE — Assessment & Plan Note (Addendum)
Chronic x2-3/night in h/o BPH.  Update UA.

## 2021-11-17 NOTE — Assessment & Plan Note (Signed)
In setting recently taking OTC decongestants.  He is on diltiazem for h/o atrial flutter.

## 2021-11-17 NOTE — Patient Instructions (Addendum)
Urinalysis today.  Try to limit fluids 2 hours prior to bedtime, use the restroom prior to getting into bed. See below for sleep hygiene measures. Consider trial of melatonin 5mg  at bedtime for sleep (over the counter).  I wonder if diltiazem may be contributing to sleep symptoms - if not improving, touch base with cardiology about trial off vs other options.  Use nasacort every few to several days as needed, watching for recurrent dizziness symptom.  Good to see you today.  Let us know if not improving with this.   Sleep hygiene checklist: 1. Avoid naps during the day 2. Avoid stimulants such as caffeine and nicotine. Avoid bedtime alcohol (it can speed onset of sleep but the body's metabolism can cause awakenings). 3. All forms of exercise help ensure sound sleep - limit vigorous exercise to morning or late afternoon 4. Avoid food too close to bedtime including chocolate (which contains caffeine) 5. Soak up natural light 6. Establish regular bedtime routine. 7. Associate bed with sleep - avoid TV, computer or phone, reading while in bed. 8. Ensure pleasant, relaxing sleep environment - quiet, dark, cool room.

## 2021-11-17 NOTE — Progress Notes (Signed)
Patient ID: Keith Jordan, male    DOB: [birth year], 80 y.o.   MRN: [id number]  This visit was conducted in person.  BP (!) 144/66   Pulse 69   Temp (!) 97.5 F (36.4 C) (Temporal)   Ht 5\' 8"  (1.727 m)   Wt 149 lb 7 oz (67.8 kg)   SpO2 97%   BMI 22.72 kg/m    CC: insomnia trouble Subjective:   HPI: Keith Jordan is a 80 y.o. male presenting on 11/17/2021 for Insomnia (C/o waking several times during the night. Pt recently saw cards about the sxs thinking it may be a heart issue. Wore a heart monitor but showed heart is fine. Told to see PCP.  Pt wants to discuss sleep apnea. )   2 mo h/o worsening insomnia - waking up 3-4 times a night with hot flash across chest as well as hearing heart beat in ear. No chest pain, dyspnea, dizziness, palpitations. No dysuria or urgency/frequency.   Has bedtime routine. Bedtime is 10pm but washes TV until around 11pm, wakes up at 5am. Falls asleep easily. No daytime naps. Separates dinner from bedtime by about 2-3 hours. Doesn't take medications for this. No significant caffeine or alcohol. No diet changes or new supplements. No daytime somnolence, wakes up feeling rested, no witnessed apneic episodes. Tried snoring strips without benefit.   He notes when he tries to increase diltiazem to 180 it worsens sleep. Seems to do better on diltiazem CD 120mg  daily - takes in the morning.   Prior to this was waking up 2-3 times with nocturia, fell back asleep easily.  Saw cardiology s/p reassuring 10 day heart monitor evaluation.   Typical Aflutter found on Zio monitor use s/p successful ablation 06/2021 followed by cardiologist Dr [name] and EP Dr Curt Bears. Eliquis was stopped, diltiazem continued. Recurrent tachypalpitations 08/2021 so he's restarted eliquis - pending cardiology/EP f/u for further steps.  Sinus congestion - started nasacort with benefit however he started having dizziness side effect. He's been taking OTC sinus med with decongestant  recently. Congestion and sinus pressure > rhinorrhea/PNdrainage. Flonsae caused trouble sleeping.   He's been losing weight due to diet changes - cut down on carbs and sugars.      Relevant past medical, surgical, family and social history reviewed and updated as indicated. Interim medical history since our last visit reviewed. Allergies and medications reviewed and updated. Outpatient Medications Prior to Visit  Medication Sig Dispense Refill   acetaminophen (TYLENOL) 500 MG tablet Take 500 mg by mouth every 6 (six) hours as needed for mild pain.     Ascorbic Acid (VITAMIN C) 1000 MG tablet Take 500 mg by mouth 2 (two) times daily.     atorvastatin (LIPITOR) 10 MG tablet Take 1 tablet (10 mg total) by mouth daily. 90 tablet 3   carboxymethylcellulose (REFRESH PLUS) 0.5 % SOLN Place 1 drop into both eyes 2 (two) times daily as needed (dry eyes).     diltiazem (CARDIZEM CD) 180 MG 24 hr capsule Take 1 capsule (180 mg total) by mouth daily. 30 capsule 2   ELIQUIS 5 MG TABS tablet Take 5 mg by mouth 2 (two) times daily.     Multiple Vitamins-Minerals (MULTIVITAMIN WITH MINERALS) tablet Take 1 tablet by mouth daily. Unknown strenght     Multiple Vitamins-Minerals (PRESERVISION AREDS) CAPS Take 1 capsule by mouth in the morning and at bedtime. (Patient taking differently: Take 1 capsule by mouth in the morning and at  bedtime. Unknown strenght) 180 capsule 3   ondansetron (ZOFRAN) 8 MG tablet Take 1 tablet (8 mg total) by mouth every 8 (eight) hours as needed for nausea or vomiting. 30 tablet 0   sodium chloride (OCEAN) 0.65 % SOLN nasal spray Place 1 spray into both nostrils as needed for congestion.     triamcinolone (NASACORT) 55 MCG/ACT AERO nasal inhaler Place 1 spray into the nose daily as needed (sinus congestion). 1 each    No facility-administered medications prior to visit.     Per HPI unless specifically indicated in ROS section below Review of Systems  Objective:  BP (!) 144/66    Pulse 69   Temp (!) 97.5 F (36.4 C) (Temporal)   Ht 5\' 8"  (1.727 m)   Wt 149 lb 7 oz (67.8 kg)   SpO2 97%   BMI 22.72 kg/m   Wt Readings from Last 3 Encounters:  11/17/21 149 lb 7 oz (67.8 kg)  10/13/21 153 lb 3 oz (69.5 kg)  09/03/21 156 lb 4.8 oz (70.9 kg)      Physical Exam Vitals and nursing note reviewed.  Constitutional:      Appearance: Normal appearance. He is not ill-appearing.  HENT:     Mouth/Throat:     Mouth: Mucous membranes are moist.     Pharynx: Oropharynx is clear. No pharyngeal swelling or posterior oropharyngeal erythema.  Neck:     Thyroid: No thyroid mass or thyromegaly.  Cardiovascular:     Rate and Rhythm: Normal rate and regular rhythm.     Pulses: Normal pulses.     Heart sounds: Normal heart sounds. No murmur heard. Pulmonary:     Effort: Pulmonary effort is normal. No respiratory distress.     Breath sounds: Normal breath sounds. No wheezing, rhonchi or rales.  Musculoskeletal:     Cervical back: Normal range of motion and neck supple. No rigidity.     Right lower leg: No edema.     Left lower leg: No edema.  Skin:    General: Skin is warm and dry.  Neurological:     Mental Status: He is alert.  Psychiatric:        Mood and Affect: Mood normal.        Behavior: Behavior normal.      Results for orders placed or performed in visit on 11/17/21  POCT Urinalysis Dipstick (Automated)  Result Value Ref Range   Color, UA yellow    Clarity, UA clear    Glucose, UA Negative Negative   Bilirubin, UA negative    Ketones, UA negative    Spec Grav, UA 1.020 1.010 - 1.025   Blood, UA +/-    pH, UA 5.0 5.0 - 8.0   Protein, UA Positive (A) Negative   Urobilinogen, UA 0.2 0.2 or 1.0 E.U./dL   Nitrite, UA negative    Leukocytes, UA Negative Negative    Assessment & Plan:  This visit occurred during the SARS-CoV-2 public health emergency.  Safety protocols were in place, including screening questions prior to the visit, additional usage of staff  PPE, and extensive cleaning of exam room while observing appropriate contact time as indicated for disinfecting solutions.   Problem List Items Addressed This Visit     Typical atrial flutter ([place])    Controlled with diltiazem CD 120mg  daily.  See below.       Insomnia - Primary    Describes sleep maintenance insomnia over the past several months - possibly temporally related  to when diltiazem was started (04/2021) as he notes worsening sleep disturbances when he increased dose to 180mg .  Sleep hygiene measures reviewed, handout provided. Discussed melatonin trial. Update me with effect. To touch base with cards if bothersome to the point of wanting to try different for atrial flutter.       Nocturia    Chronic x2-3/night in h/o BPH.  Update UA.       Relevant Orders   POCT Urinalysis Dipstick (Automated) (Completed)   Elevated blood pressure reading    In setting recently taking OTC decongestants.  He is on diltiazem for h/o atrial flutter.         No orders of the defined types were placed in this encounter.  Orders Placed This Encounter  Procedures   POCT Urinalysis Dipstick (Automated)     Patient Instructions  Urinalysis today.  Try to limit fluids 2 hours prior to bedtime, use the restroom prior to getting into bed. See below for sleep hygiene measures. Consider trial of melatonin 5mg  at bedtime for sleep (over the counter).  I wonder if diltiazem may be contributing to sleep symptoms - if not improving, touch base with cardiology about trial off vs other options.  Use nasacort every few to several days as needed, watching for recurrent dizziness symptom.  Good to see you today.  Let us know if not improving with this.   Sleep hygiene checklist: 1. Avoid naps during the day 2. Avoid stimulants such as caffeine and nicotine. Avoid bedtime alcohol (it can speed onset of sleep but the body's metabolism can cause awakenings). 3. All forms of exercise help ensure sound  sleep - limit vigorous exercise to morning or late afternoon 4. Avoid food too close to bedtime including chocolate (which contains caffeine) 5. Soak up natural light 6. Establish regular bedtime routine. 7. Associate bed with sleep - avoid TV, computer or phone, reading while in bed. 8. Ensure pleasant, relaxing sleep environment - quiet, dark, cool room.   Follow up plan: Return if symptoms worsen or fail to improve.  [name], MD

## 2021-11-17 NOTE — Assessment & Plan Note (Signed)
Controlled with diltiazem CD 120mg  daily.  See below.

## 2021-11-26 DIAGNOSIS — H353132 Nonexudative age-related macular degeneration, bilateral, intermediate dry stage: Secondary | ICD-10-CM | POA: Diagnosis not present

## 2021-12-03 ENCOUNTER — Inpatient Hospital Stay: Payer: Medicare HMO | Attending: Hematology and Oncology

## 2021-12-03 ENCOUNTER — Telehealth: Payer: Self-pay | Admitting: *Deleted

## 2021-12-03 ENCOUNTER — Other Ambulatory Visit: Payer: Self-pay

## 2021-12-03 ENCOUNTER — Inpatient Hospital Stay: Payer: Medicare HMO | Admitting: Hematology and Oncology

## 2021-12-03 VITALS — BP 146/63 | HR 69 | Temp 98.1°F | Resp 17 | Wt 153.5 lb

## 2021-12-03 DIAGNOSIS — D72820 Lymphocytosis (symptomatic): Secondary | ICD-10-CM

## 2021-12-03 DIAGNOSIS — R161 Splenomegaly, not elsewhere classified: Secondary | ICD-10-CM | POA: Insufficient documentation

## 2021-12-03 DIAGNOSIS — R110 Nausea: Secondary | ICD-10-CM | POA: Diagnosis not present

## 2021-12-03 DIAGNOSIS — I4892 Unspecified atrial flutter: Secondary | ICD-10-CM | POA: Diagnosis not present

## 2021-12-03 DIAGNOSIS — D696 Thrombocytopenia, unspecified: Secondary | ICD-10-CM | POA: Diagnosis not present

## 2021-12-03 DIAGNOSIS — C8307 Small cell B-cell lymphoma, spleen: Secondary | ICD-10-CM | POA: Diagnosis not present

## 2021-12-03 LAB — CBC WITH DIFFERENTIAL (CANCER CENTER ONLY)
Abs Immature Granulocytes: 0.34 10*3/uL — ABNORMAL HIGH (ref 0.00–0.07)
Basophils Absolute: 0.1 10*3/uL (ref 0.0–0.1)
Basophils Relative: 0 %
Eosinophils Absolute: 0.5 10*3/uL (ref 0.0–0.5)
Eosinophils Relative: 0 %
HCT: 41.8 % (ref 39.0–52.0)
Hemoglobin: 13.6 g/dL (ref 13.0–17.0)
Immature Granulocytes: 0 %
Lymphocytes Relative: 95 %
Lymphs Abs: 129.9 10*3/uL — ABNORMAL HIGH (ref 0.7–4.0)
MCH: 31.3 pg (ref 26.0–34.0)
MCHC: 32.5 g/dL (ref 30.0–36.0)
MCV: 96.1 fL (ref 80.0–100.0)
Monocytes Absolute: 3.7 10*3/uL — ABNORMAL HIGH (ref 0.1–1.0)
Monocytes Relative: 3 %
Neutro Abs: 3.3 10*3/uL (ref 1.7–7.7)
Neutrophils Relative %: 2 %
Platelet Count: 130 10*3/uL — ABNORMAL LOW (ref 150–400)
RBC: 4.35 MIL/uL (ref 4.22–5.81)
RDW: 13.7 % (ref 11.5–15.5)
Smear Review: NORMAL
WBC Count: 137.8 10*3/uL (ref 4.0–10.5)
nRBC: 0 % (ref 0.0–0.2)

## 2021-12-03 LAB — CMP (CANCER CENTER ONLY)
ALT: 13 U/L (ref 0–44)
AST: 25 U/L (ref 15–41)
Albumin: 4 g/dL (ref 3.5–5.0)
Alkaline Phosphatase: 83 U/L (ref 38–126)
Anion gap: 11 (ref 5–15)
BUN: 25 mg/dL — ABNORMAL HIGH (ref 8–23)
CO2: 28 mmol/L (ref 22–32)
Calcium: 9.2 mg/dL (ref 8.9–10.3)
Chloride: 104 mmol/L (ref 98–111)
Creatinine: 1.23 mg/dL (ref 0.61–1.24)
GFR, Estimated: 60 mL/min — ABNORMAL LOW (ref >60)
Glucose, Bld: 126 mg/dL — ABNORMAL HIGH (ref 70–99)
Potassium: 4.4 mmol/L (ref 3.5–5.1)
Sodium: 143 mmol/L (ref 135–145)
Total Bilirubin: 0.7 mg/dL (ref 0.3–1.2)
Total Protein: 6.8 g/dL (ref 6.5–8.1)

## 2021-12-03 LAB — LACTATE DEHYDROGENASE: LDH: 166 U/L (ref 98–192)

## 2021-12-03 NOTE — Progress Notes (Signed)
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 Referred to Emergency Department. 02/05/2021: establish care with Dr. Lorenso Courier  02/24/2021: Week 1 of Monotherapy Rituximab 03/03/2021:  Week 2 of Monotherapy Rituximab 03/10/2021:  Week 3 of Monotherapy Rituximab. WBC 109.8 03/17/2021: Week 4 of Monotherapy Rituximab. WBC 94.8 03/24/2021: WBC 59.8 05/01/2021: WBC 89.1, Hgb 13.5, Plt 136 09/03/2021: WBC 97.8, Hgb 12.9, Plt 141 12/03/2021: WBC 137.8, Hgb 13.6, MCV 96.1, Plt 130  Interval History:  Keith Jordan 80 y.o. male with medical history significant for splenic marginal zone lymphoma who presents for a follow up visit. The patient's last visit was on 09/03/2021. In the interim since the last visit he has been in stable health.   On exam today Keith Jordan reports he has been well overall in the interim since her last visit.  He notes his energy is "better".  Is able do most everything that he wants to do without difficulty.  He is having some difficulties with waking up in the middle of the night.  He was put on a heart monitor for this and does not find any abnormalities.  He denies any issues with headache or vision change.  His weight has been steady and his appetite has been good.  He recently underwent imaging of his spine which showed no clear etiology of his back pain.  He does occasionally have some pressure on the left side of his abdomen but otherwise denies any fevers, chills, sweats, nausea, vomiting or diarrhea.  A full 10 point ROS is listed below.  MEDICAL HISTORY:  Past  Medical History:  Diagnosis Date   Abnormal breath sounds 10/11/2020   Acute sinusitis 01/29/2021   Advanced care planning/counseling discussion 09/01/2016   BPH (benign prostatic hyperplasia) 09/29/2018   Ear fullness, bilateral 04/03/2021   Health maintenance examination 09/01/2016   Hyperlipidemia    Irregular heart beat 03/24/2021   Ischemic optic neuropathy 2006   [place] [facility]   Leukocytosis 01/30/2021   Severe s/p ER eval 01/2021   Medicare annual wellness visit, subsequent 10/04/2019   Nausea without vomiting 03/24/2021   Personal history of colonic adenoma 07/30/2008   Prediabetes 07/05/2016   A1c 6.5% 03/2014    Prediabetes 07/05/2016   A1c 6.5% 03/2014    Primary osteoarthritis of knee    bilateral s/p L knee replacement, receives R knee injections [name], [name])   Sebaceous cyst 01/30/2020   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    SURGICAL HISTORY: Past Surgical History:  Procedure Laterality Date   A-FLUTTER ABLATION N/A 07/11/2021   Procedure: A-FLUTTER ABLATION;  Surgeon: [name], MD;  Location: [facility];  Service: Cardiovascular;  Laterality: N/A;   COLONOSCOPY  12/2013   no polyps, no rpt due [name])   CYST EXCISION  01/2020   TOTAL KNEE ARTHROPLASTY Left 2005   [name]    SOCIAL HISTORY: Social History   Socioeconomic History   Marital status: Married    Spouse name: Not on file   Number of children: Not on file   Years of education: Not on file   Highest education level: Not on file  Occupational  History   Not on file  Tobacco Use   Smoking status: Never   Smokeless tobacco: Never  Vaping Use   Vaping Use: Never used  Substance and Sexual Activity   Alcohol use: Yes    Comment: occasional   Drug use: No   Sexual activity: Not Currently  Other Topics Concern   Not on file  Social History Narrative   Lives with wife, 2 cats   Occ: retired Personal assistant   Activity: works in yard   Diet: some water, fruits/vegetables  daily   Social Determinants of Radio broadcast assistant Strain: Low Risk    Difficulty of Paying Living Expenses: Not very hard  Food Insecurity: Not on file  Transportation Needs: Not on file  Physical Activity: Not on file  Stress: Not on file  Social Connections: Not on file  Intimate Partner Violence: Not on file    FAMILY HISTORY: Family History  Problem Relation Age of Onset   Blindness Mother 43       ?stroke in eyes   Lung disease Father        Ecologist   Arthritis Father    Colon cancer Neg Hx    Pancreatic cancer Neg Hx    Stomach cancer Neg Hx    Esophageal cancer Neg Hx    Rectal cancer Neg Hx    Cancer Neg Hx    Diabetes Neg Hx    CAD Neg Hx    Stroke Neg Hx     ALLERGIES:  has No Known Allergies.  MEDICATIONS:  Current Outpatient Medications  Medication Sig Dispense Refill   acetaminophen (TYLENOL) 500 MG tablet Take 500 mg by mouth every 6 (six) hours as needed for mild pain.     Ascorbic Acid (VITAMIN C) 1000 MG tablet Take 500 mg by mouth 2 (two) times daily.     atorvastatin (LIPITOR) 10 MG tablet Take 1 tablet (10 mg total) by mouth daily. 90 tablet 3   carboxymethylcellulose (REFRESH PLUS) 0.5 % SOLN Place 1 drop into both eyes 2 (two) times daily as needed (dry eyes).     diltiazem (CARDIZEM CD) 180 MG 24 hr capsule Take 1 capsule (180 mg total) by mouth daily. 30 capsule 2   ELIQUIS 5 MG TABS tablet Take 5 mg by mouth 2 (two) times daily.     Multiple Vitamins-Minerals (MULTIVITAMIN WITH MINERALS) tablet Take 1 tablet by mouth daily. Unknown strenght     Multiple Vitamins-Minerals (PRESERVISION AREDS) CAPS Take 1 capsule by mouth in the morning and at bedtime. (Patient taking differently: Take 1 capsule by mouth in the morning and at bedtime. Unknown strenght) 180 capsule 3   ondansetron (ZOFRAN) 8 MG tablet Take 1 tablet (8 mg total) by mouth every 8 (eight) hours as needed for nausea or vomiting. 30 tablet 0   sodium chloride (OCEAN) 0.65 %  SOLN nasal spray Place 1 spray into both nostrils as needed for congestion.     triamcinolone (NASACORT) 55 MCG/ACT AERO nasal inhaler Place 1 spray into the nose daily as needed (sinus congestion). 1 each    No current facility-administered medications for this visit.    REVIEW OF SYSTEMS:   Constitutional: ( - ) fevers, ( - )  chills , ( - ) night sweats Eyes: ( - ) blurriness of vision, ( - ) double vision, ( - ) watery eyes Ears, nose, mouth, throat, and face: ( - ) mucositis, ( - ) sore throat Respiratory: ( - )  cough, ( - ) dyspnea, ( - ) wheezes Cardiovascular: ( - ) palpitation, ( - ) chest discomfort, ( - ) lower extremity swelling Gastrointestinal:  ( - ) nausea, ( - ) heartburn, ( - ) change in bowel habits Skin: ( - ) abnormal skin rashes Lymphatics: ( - ) new lymphadenopathy, ( - ) easy bruising Neurological: ( - ) numbness, ( -) tingling, ( - ) new weaknesses Behavioral/Psych: ( - ) mood change, ( - ) new changes  All other systems were reviewed with the patient and are negative.  PHYSICAL EXAMINATION: ECOG PERFORMANCE STATUS: 1 - Symptomatic but completely ambulatory  Vitals:   12/03/21 1002  BP: (!) 146/63  Pulse: 69  Resp: 17  Temp: 98.1 F (36.7 C)  SpO2: 98%    Filed Weights   12/03/21 1002  Weight: 153 lb 8 oz (69.6 kg)    GENERAL: well appearing elderly Caucasian male alert, no distress and comfortable SKIN: skin color, texture, turgor are normal, no rashes or significant lesions EYES: conjunctiva are pink and non-injected, sclera clear LUNGS: clear to auscultation and percussion with normal breathing effort HEART: regular rate & rhythm and no murmurs and no lower extremity edema Musculoskeletal: no cyanosis of digits and no clubbing  PSYCH: alert & oriented x 3, fluent speech NEURO: no focal motor/sensory deficits  LABORATORY DATA:  I have reviewed the data as listed CBC Latest Ref Rng & Units 12/03/2021 10/13/2021 09/03/2021  WBC 4.0 - 10.5 K/uL  137.8(HH) 99.3 Repeated and verified X2.(HH) 97.8(HH)  Hemoglobin 13.0 - 17.0 g/dL 13.6 14.2 12.9(L)  Hematocrit 39.0 - 52.0 % 41.8 44.8 39.2  Platelets 150 - 400 K/uL 130(L) 136.0(L) 141(L)    CMP Latest Ref Rng & Units 10/13/2021 09/03/2021 07/04/2021  Glucose 70 - 99 mg/dL 117(H) 243(H) 122(H)  BUN 6 - 23 mg/dL 25(H) 21 18  Creatinine 0.40 - 1.50 mg/dL 1.20 1.24 1.26  Sodium 135 - 145 mEq/L 140 142 140  Potassium 3.5 - 5.1 mEq/L 4.1 4.1 4.1  Chloride 96 - 112 mEq/L 100 105 101  CO2 19 - 32 mEq/L 32 26 26  Calcium 8.4 - 10.5 mg/dL 9.7 9.4 9.3  Total Protein 6.5 - 8.1 g/dL - 6.6 -  Total Bilirubin 0.3 - 1.2 mg/dL - 0.6 -  Alkaline Phos 38 - 126 U/L - 85 -  AST 15 - 41 U/L - 23 -  ALT 0 - 44 U/L - 11 -   RADIOGRAPHIC STUDIES: LONG TERM MONITOR (3-14 DAYS)  Result Date: 11/06/2021 Patch Wear Time:  10 days and 21 hours Predominant underlying rhythm was sinus rhythm Less than 1% atrial fibrillation Triggered events associated with sinus rhythm Will Camnitz, MD 11/06/2021 3:47 PM     ASSESSMENT & PLAN Keith Jordan 80 y.o. male with medical history significant for splenic marginal zone lymphoma. CT CAP from 02/13/21  showed only splenic involvement and a bone marrow biopsy from 02/21/21 confirmed the diagnosis.  The patient has only leukocytosis and splenomegaly.  The recommendation is monotherapy rituximab 375 mg per metered squared q. 7 days x 4 doses. He completed this on 03/17/2021.   At this time is marginal zone lymphoma appears like an indolent lymphocyte predominant variant.  These tend to behave more like CLL.  Given that I would recommend holding on treatment unless the patient were to have difficulties with splenomegaly, anemia, or worsening thrombocytopenia.  For treatment choices one could consider treatment with Ibrutinib/Zanbrutinib vs Bendamustine + ritux/obinutuzumab. Would need  caution with ibrutinib given patient's cardiac history and current anticoagulation therapy.     Patient is doing well without any significant limitations. Labs from today show lymphocytosis and modest stable thrombocytopenia. Plan for patient to return to the clinic in 12 weeks.  #Splenic Marginal Zone Lymphoma, stable --findings are consistent with a splenic marginal zone lymphoma. --patient completed bone marrow biopsy and a CT chest abdomen pelvis. Disease appear limited to peripheral blood and spleen.  --Treatment of choice with confirmed splenic marginal zone lymphoma is rituximab 375 mg/m q. 7 days x 4 doses. He completed this on 03/17/2021.  --Due to persistent lymphocytosis, recommend to continue with allopurinol to prevent TLS. --if there is concern for worsening disease with an indication for treatment can consider treatment with Ibrutinib/Zanbrutinib vs Bendamustine + ritux/obinutuzumab. Would need caution with ibrutinib given patient's cardiac history and current anticoagulation therapy.  --RTC in 3 months with labs   #Thrombocytopenia --Plt today stable at 130 --likely 2/2 to splenomegaly.   #Nausea --Take Zofran 8 mg q 8 hours PRN.   #Atrial Flutter --patient follows with Cardiology  No orders of the defined types were placed in this encounter.  All questions were answered. The patient knows to call the clinic with any problems, questions or concerns.  A total of more than 30 minutes were spent on this encounter and over half of that time was spent on counseling and coordination of care as outlined above.   Ledell Peoples, MD Department of Hematology/Oncology [place] at [facility] Phone: [phone] Pager: [phone] Email: [email or web address]

## 2021-12-03 NOTE — Telephone Encounter (Signed)
Pam from [facility] called to report critical lab value WBC 137.8. Notified Dr.Dorsey

## 2021-12-11 ENCOUNTER — Other Ambulatory Visit: Payer: Self-pay

## 2021-12-11 ENCOUNTER — Telehealth: Payer: Self-pay | Admitting: Cardiology

## 2021-12-11 MED ORDER — METOPROLOL SUCCINATE ER 25 MG PO TB24: 12.5000 mg | ORAL_TABLET | Freq: Every day | ORAL | 1 refills | Status: DC

## 2021-12-11 NOTE — Telephone Encounter (Signed)
Called patient, advised of message below.  RX updated and sent to pharmacy.  Patient verbalized understanding.

## 2021-12-11 NOTE — Telephone Encounter (Signed)
Please have him stop the Cardizem.  Let him start Toprol-XL 12.5 mg daily.  I will see him in February please ask him to keep his appointment on February 7 as scheduled.

## 2021-12-11 NOTE — Telephone Encounter (Signed)
Pt c/o medication issue:  1. Name of Medication: diltiazem (CARDIZEM CD) 180 MG 24 hr capsule  2. How are you currently taking this medication (dosage and times per day)? Take 1 capsule (180 mg total) by mouth daily.  3. Are you having a reaction (difficulty breathing--STAT)? no  4. What is your medication issue? Patient is having a issue with this medication

## 2021-12-11 NOTE — Telephone Encounter (Signed)
Called patient, he states that he was given 180 mg diltiazem for his flutter. However when he started that he began having trouble sleeping. He seen is PCP back in November who thought it was coming from diltiazem. Patient states he is not able to take the 180 mg at all anymore, he is taking 120 mg. The 180 mg dose would keep him awake (he would go to sleep, wake up and not be able to go back to sleep), however with the 120 mg he will still wake, but after 20-30 minutes go back to sleep. He states currently he is getting about 4 hours of sleep a night and he needs some relief. He states per [facility] he should message Dr.Tobb for any issues, and follow with her.  I advised I would route message to MD to advise if we should change, patient has appointment in February but states he can not wait that long with no sleep.   Thank you!

## 2021-12-13 DIAGNOSIS — Z0100 Encounter for examination of eyes and vision without abnormal findings: Secondary | ICD-10-CM | POA: Diagnosis not present

## 2022-01-02 ENCOUNTER — Encounter: Payer: Self-pay | Admitting: Cardiology

## 2022-01-02 NOTE — Telephone Encounter (Addendum)
Spoke with patient of Dr. Harriet Masson. He has been on metoprolol succinate since 12/11/21. His head feels better on this medication, but the hot flashes persist, impairs his ability to get a restful night's sleep. Pulse is fine - no BP monitor. He would like recommendations given persistent hot flash symptoms and he states his feet are warm which is newer. Explained it may be Monday before we get him a reply.

## 2022-01-02 NOTE — Telephone Encounter (Signed)
Pt c/o medication issue:  1. Name of Medication:  metoprolol succinate (TOPROL-XL) 25 MG 24 hr tablet  2. How are you currently taking this medication (dosage and times per day)? As prescribed   3. Are you having a reaction (difficulty breathing--STAT)? N/A  4. What is your medication issue? Patient is calling stating that he is still waking up 3 to 4x a night with a hot flash in his chest, legs, and feet since this medication change. He reports his HR is now almost normal and is no longer elevated as it was before on diltiazem. Please advise.

## 2022-01-02 NOTE — Telephone Encounter (Signed)
Patient returning call.

## 2022-01-02 NOTE — Telephone Encounter (Signed)
Error

## 2022-01-02 NOTE — Telephone Encounter (Signed)
Left message to call back  

## 2022-01-05 NOTE — Telephone Encounter (Signed)
Since patient had issues with diltiazem as well as metoprolol, suspect that issue may be due more to current oncology treatment.  Will route to oncology team to see if they have any input as well.

## 2022-01-12 ENCOUNTER — Telehealth: Payer: Self-pay | Admitting: Cardiology

## 2022-01-12 NOTE — Telephone Encounter (Signed)
Called patient back. He states he called on 01/06- note in epic. Looks like we were waiting to see if Oncology team responded. No response as of yet if they think his symptoms could be related. He states he feels better on the Metoprolol, but states the leg tingling,and warmness wakes him up in the middle of the night. He states no skin color change, does not occur during the day, he will continue to take the medication until he hears recommendations from MD. Advised I would send to her to make her aware, and we could call him back. Patient verbalized understanding.  Thankful for call back.

## 2022-01-12 NOTE — Telephone Encounter (Signed)
Pt c/o medication issue:  1. Name of Medication: metoprolol succinate (TOPROL-XL) 25 MG 24 hr tablet  2. How are you currently taking this medication (dosage and times per day)? Take 0.5 tablets (12.5 mg total) by mouth daily.  3. Are you having a reaction (difficulty breathing--STAT)? no  4. What is your medication issue? Patient states that the lower part of legs feel like a heat pd is on it. He stated no one has called in back from when he called in on the 1/6. Please advise

## 2022-01-13 NOTE — Telephone Encounter (Signed)
Called patient, advised of message from Dr.Tobb.  Patient verbalized understanding, he will wait and discuss with Dr.Tobb at visit in February.

## 2022-01-15 NOTE — Telephone Encounter (Signed)
Thank you for reaching out. Keith Jordan is not currently on any oncology treatment. 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 I do not think his condition explains the symptoms he is having with his cardiology medications.

## 2022-01-15 NOTE — Telephone Encounter (Signed)
Please see message. Oncologist does not believe symptoms are related to past oncology treatment

## 2022-01-19 NOTE — Telephone Encounter (Signed)
Spoke to pt to give Dr. Terrial Rhodes recommendations. No further questions expressed at this time. Pt encouraged to reach out to [place] with any further concerns. He verbalized understanding.

## 2022-01-19 NOTE — Telephone Encounter (Signed)
Please have him stop the metoprolol and to see him on February 7.

## 2022-01-21 ENCOUNTER — Telehealth: Payer: Self-pay | Admitting: Cardiology

## 2022-01-21 NOTE — Telephone Encounter (Signed)
Returned call to patient, patient states that his hot flashes did get better while off the metoprolol.    Patient would also like recommendations regarding medication for cold.

## 2022-01-21 NOTE — Telephone Encounter (Signed)
Called pt to relay Dr. Terrial Rhodes message. He states he still have Cardizem 102 mg tablets at home and has an appt in the next few weeks with Dr. Harriet Masson. Pt encouraged to restart Cardizem 120 mg once daily and give and update when he comes for his visit. He was also encouraged to reach out to his PCP or the pharmacist for recommendations for cold medications. He verbalized understanding and thanked me for calling him.

## 2022-01-21 NOTE — Telephone Encounter (Signed)
Please ask him to start Cardizem 120 mg daily.

## 2022-01-21 NOTE — Telephone Encounter (Signed)
Patient c/o Palpitations:  High priority if patient c/o lightheadedness, shortness of breath, or chest pain  How long have you had palpitations/irregular HR/ Afib? Are you having the symptoms now? No. Went back to taking   Are you currently experiencing lightheadedness, SOB or CP? No   Do you have a history of afib (atrial fibrillation) or irregular heart rhythm? No   Have you checked your BP or HR? (document readings if available): 82; down to 64 after taking medication  Are you experiencing any other symptoms? No    Patient was taken off of metoprolol succinate (TOPROL-XL) 25 MG 24 hr tablet by Dr. Harriet Masson. Experienced palpitations 20 hrs after stopping medication. Took medication and palpitation stopped

## 2022-01-21 NOTE — Telephone Encounter (Signed)
Returned call to patient who states that he recently stopped his Metoprolol Succinate yesterday and around 4 am this morning he woke with palpitations and his feelings of his heart racing. Patient reports his BP was 104/59 with HR 82 at that time. Patient states that this morning around 8/830 he took his dose of Metoprolol succinate 12.5mg  due to his palpitations. Patient reports his most recent BP was 101/42 with HR 62. Patient reports that his palpitations have gotten better since taking the dose. Patient denies any chest pain, shortness of breath, or lightheadedness. Patient also reports that he has a head cold and has been taking some cold medication that has acetaminophen/dextromethorphan in it- patient reports he was told this would not affect his BP. Advised patient that this could have possibly affected his palpitations, and also advised patient to make sure he is hydrated as well. Patient would like to know what he should do in regard to his Metoprolol succinate, and would also like to know if there is a recommendation for another cold medication he can take. Advised I would forward to PharmD/MD for advice. Patient verbalized understanding.

## 2022-01-21 NOTE — Telephone Encounter (Signed)
Patient was instructed to stop metoprolol due to complaints of hot flashes.  Did the hot flashes resolve once off the medication?    If not I would recommend restarting the metoprolol

## 2022-01-28 ENCOUNTER — Ambulatory Visit: Payer: Medicare HMO | Admitting: Cardiology

## 2022-02-03 ENCOUNTER — Other Ambulatory Visit: Payer: Self-pay

## 2022-02-03 ENCOUNTER — Encounter: Payer: Self-pay | Admitting: Cardiology

## 2022-02-03 ENCOUNTER — Ambulatory Visit: Payer: Medicare HMO | Admitting: Cardiology

## 2022-02-03 VITALS — BP 140/60 | HR 71 | Ht 69.0 in | Wt 154.6 lb

## 2022-02-03 DIAGNOSIS — Z79899 Other long term (current) drug therapy: Secondary | ICD-10-CM

## 2022-02-03 DIAGNOSIS — E782 Mixed hyperlipidemia: Secondary | ICD-10-CM | POA: Diagnosis not present

## 2022-02-03 DIAGNOSIS — I483 Typical atrial flutter: Secondary | ICD-10-CM

## 2022-02-03 LAB — CBC WITH DIFFERENTIAL/PLATELET

## 2022-02-03 MED ORDER — CARVEDILOL 6.25 MG PO TABS: 6.2500 mg | ORAL_TABLET | Freq: Two times a day (BID) | ORAL | 3 refills | Status: DC

## 2022-02-03 NOTE — Patient Instructions (Signed)
Medication Instructions:  Your physician has recommended you make the following change in your medication:  STOP: Cardizem  START: Coreg 6.25 mg twice daily *If you need a refill on your cardiac medications before your next appointment, please call your pharmacy*   Lab Work: Your physician recommends that you return for lab work in:  TODAY: BMET, [place], CBC If you have labs (blood work) drawn today and your tests are completely normal, you will receive your results only by: MyChart Message (if you have MyChart) OR A paper copy in the mail If you have any lab test that is abnormal or we need to change your treatment, we will call you to review the results.   Testing/Procedures: None   Follow-Up: At [facility], you and your health needs are our priority.  As part of our continuing mission to provide you with exceptional heart care, we have created designated Provider Care Teams.  These Care Teams include your primary Cardiologist (physician) and Advanced Practice Providers (APPs -  Physician Assistants and Nurse Practitioners) who all work together to provide you with the care you need, when you need it.  We recommend signing up for the patient portal called "MyChart".  Sign up information is provided on this After Visit Summary.  MyChart is used to connect with patients for Virtual Visits (Telemedicine).  Patients are able to view lab/test results, encounter notes, upcoming appointments, etc.  Non-urgent messages can be sent to your provider as well.   To learn more about what you can do with MyChart, go to [email or web address].    Your next appointment:   4 month(s)  The format for your next appointment:   In Person  Provider:   [name], DO     Other Instructions

## 2022-02-03 NOTE — Progress Notes (Signed)
Cardiology Office Note:    Date:  02/04/2022   ID:  Keith Jordan, DOB 06/13/1941, MRN [id number]  PCP:  [name], MD  Cardiologist:  [name], DO  Electrophysiologist:  None   Referring MD: [name], MD   " I am still having issues"   History of Present Illness:    Keith Jordan is a 81 y.o. 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   I saw the patient on 04/17/2021 and at that time he reported intermittent palpitations, therefore a zio live monitor was placed on the patient. Two days of wearing the monitor, atrial flutter was noted and we started the patient on anticoagulation with Eliquis ( CHADS2 VASc score of 2). He was also started on Cardizem 120 mg daily.   I last saw the patient on August 02, 2019.  At that time we continued his Cardizem.  Since I saw the patient he also had seen EP.  He had maintained sinus rhythm.  Eliquis was stopped.  In the interim he has had issues with Cardizem which was stopped and transition to metoprolol on metoprolol he had significant leg cramping that medication also.Marland Kitchen  He requested to be back on Cardizem.  He is in office today and tells me that he still is having some problems with the Cardizem.  He notes that he is having heart rates that sometimes goes high and at times is low.  Most of his issue sort of leg cramps.      Past Medical History:  Diagnosis Date   Abnormal breath sounds 10/11/2020   Acute sinusitis 01/29/2021   Advanced care planning/counseling discussion 09/01/2016   BPH (benign prostatic hyperplasia) 09/29/2018   Ear fullness, bilateral 04/03/2021   Health maintenance examination 09/01/2016   Hyperlipidemia    Irregular heart beat 03/24/2021   Ischemic optic neuropathy 2006   [place] [facility]   Leukocytosis 01/30/2021   Severe s/p ER eval 01/2021   Medicare annual wellness visit,  subsequent 10/04/2019   Nausea without vomiting 03/24/2021   Personal history of colonic adenoma 07/30/2008   Prediabetes 07/05/2016   A1c 6.5% 03/2014    Prediabetes 07/05/2016   A1c 6.5% 03/2014    Primary osteoarthritis of knee    bilateral s/p L knee replacement, receives R knee injections [name], [name])   Sebaceous cyst 01/30/2020   Splenic marginal zone b-cell lymphoma ([place]) 02/05/2021    Past Surgical History:  Procedure Laterality Date   A-FLUTTER ABLATION N/A 07/11/2021   Procedure: A-FLUTTER ABLATION;  Surgeon: [name], MD;  Location: [facility];  Service: Cardiovascular;  Laterality: N/A;   COLONOSCOPY  12/2013   no polyps, no rpt due [name])   CYST EXCISION  01/2020   TOTAL KNEE ARTHROPLASTY Left 2005   [name]    Current Medications: Current Meds  Medication Sig   acetaminophen (TYLENOL) 500 MG tablet Take 500 mg by mouth every 6 (six) hours as needed for mild pain.   Ascorbic Acid (VITAMIN C) 1000 MG tablet Take 500 mg by mouth 2 (two) times daily.   atorvastatin (LIPITOR) 10 MG tablet Take 1 tablet (10 mg total) by mouth daily.   carboxymethylcellulose (REFRESH PLUS) 0.5 % SOLN Place 1 drop into both eyes 2 (two) times daily as needed (dry eyes).   carvedilol (COREG) 6.25 MG tablet Take 1 tablet (6.25 mg total) by mouth 2 (  two) times daily.   ELIQUIS 5 MG TABS tablet Take 5 mg by mouth 2 (two) times daily.   Multiple Vitamins-Minerals (MULTIVITAMIN WITH MINERALS) tablet Take 1 tablet by mouth daily. Unknown strenght   Multiple Vitamins-Minerals (PRESERVISION AREDS) CAPS Take 1 capsule by mouth in the morning and at bedtime. (Patient taking differently: Take 1 capsule by mouth in the morning and at bedtime. Unknown strenght)   ondansetron (ZOFRAN) 8 MG tablet Take 1 tablet (8 mg total) by mouth every 8 (eight) hours as needed for nausea or vomiting.   sodium chloride (OCEAN) 0.65 % SOLN nasal spray Place 1 spray into both nostrils as needed for congestion.    triamcinolone (NASACORT) 55 MCG/ACT AERO nasal inhaler Place 1 spray into the nose daily as needed (sinus congestion).   [DISCONTINUED] diltiazem (CARDIZEM CD) 120 MG 24 hr capsule Take 120 mg by mouth daily.   [DISCONTINUED] metoprolol succinate (TOPROL-XL) 25 MG 24 hr tablet Take 0.5 tablets (12.5 mg total) by mouth daily.     Allergies:   Patient has no known allergies.   Social History   Socioeconomic History   Marital status: Married    Spouse name: Not on file   Number of children: Not on file   Years of education: Not on file   Highest education level: Not on file  Occupational History   Not on file  Tobacco Use   Smoking status: Never   Smokeless tobacco: Never  Vaping Use   Vaping Use: Never used  Substance and Sexual Activity   Alcohol use: Yes    Comment: occasional   Drug use: No   Sexual activity: Not Currently  Other Topics Concern   Not on file  Social History Narrative   Lives with wife, 2 cats   Occ: retired Personal assistant   Activity: works in yard   Diet: some water, fruits/vegetables daily   Social Determinants of Radio broadcast assistant Strain: Low Risk    Difficulty of Paying Living Expenses: Not very hard  Food Insecurity: Not on file  Transportation Needs: Not on file  Physical Activity: Not on file  Stress: Not on file  Social Connections: Not on file     Family History: The patient's family history includes Arthritis in his father; Blindness (age of onset: 30) in his mother; Lung disease in his father. There is no history of Colon cancer, Pancreatic cancer, Stomach cancer, Esophageal cancer, Rectal cancer, Cancer, Diabetes, CAD, or Stroke.  ROS:   Review of Systems  Constitution: Negative for decreased appetite, fever and weight gain.  HENT: Negative for congestion, ear discharge, hoarse voice and sore throat.   Eyes: Negative for discharge, redness, vision loss in right eye and visual halos.  Cardiovascular: Negative for chest pain,  dyspnea on exertion, leg swelling, orthopnea and palpitations.  Respiratory: Negative for cough, hemoptysis, shortness of breath and snoring.   Endocrine: Negative for heat intolerance and polyphagia.  Hematologic/Lymphatic: Negative for bleeding problem. Does not bruise/bleed easily.  Skin: Negative for flushing, nail changes, rash and suspicious lesions.  Musculoskeletal: Negative for arthritis, joint pain, muscle cramps, myalgias, neck pain and stiffness.  Gastrointestinal: Negative for abdominal pain, bowel incontinence, diarrhea and excessive appetite.  Genitourinary: Negative for decreased libido, genital sores and incomplete emptying.  Neurological: Negative for brief paralysis, focal weakness, headaches and loss of balance.  Psychiatric/Behavioral: Negative for altered mental status, depression and suicidal ideas.  Allergic/Immunologic: Negative for HIV exposure and persistent infections.    EKGs/Labs/Other Studies Reviewed:  The following studies were reviewed today:   EKG:  The ekg ordered today demonstrates    TTE 2022 IMPRESSIONS  1. Left ventricular ejection fraction, by estimation, is 70 to 75%. The  left ventricle has hyperdynamic function. The left ventricle has no  regional wall motion abnormalities. Left ventricular diastolic parameters  are consistent with Grade I diastolic  dysfunction (impaired relaxation).   2. Right ventricular systolic function is normal. The right ventricular  size is mildly enlarged. There is mildly elevated pulmonary artery  systolic pressure. The estimated right ventricular systolic pressure is  45.8 mmHg.   3. The mitral valve is normal in structure. No evidence of mitral valve  regurgitation. No evidence of mitral stenosis.   4. The aortic valve is normal in structure. Aortic valve regurgitation is  not visualized. No aortic stenosis is present.   5. The inferior vena cava is normal in size with greater than 50%  respiratory  variability, suggesting right atrial pressure of 3 mmHg.   FINDINGS   Left Ventricle: Left ventricular ejection fraction, by estimation, is 70  to 75%. The left ventricle has hyperdynamic function. The left ventricle  has no regional wall motion abnormalities. The left ventricular internal  cavity size was normal in size.  There is no left ventricular hypertrophy. Left ventricular diastolic  parameters are consistent with Grade I diastolic dysfunction (impaired  relaxation).   Right Ventricle: The right ventricular size is mildly enlarged. No  increase in right ventricular wall thickness. Right ventricular systolic  function is normal. There is mildly elevated pulmonary artery systolic  pressure. The tricuspid regurgitant  velocity is 2.89 m/s, and with an assumed right atrial pressure of 3 mmHg,  the estimated right ventricular systolic pressure is 09.9 mmHg.   Left Atrium: Left atrial size was normal in size.   Right Atrium: Right atrial size was normal in size.   Pericardium: There is no evidence of pericardial effusion.   Mitral Valve: The mitral valve is normal in structure. No evidence of  mitral valve regurgitation. No evidence of mitral valve stenosis.   Tricuspid Valve: The tricuspid valve is normal in structure. Tricuspid  valve regurgitation is mild . No evidence of tricuspid stenosis.   Aortic Valve: The aortic valve is normal in structure. Aortic valve  regurgitation is not visualized. No aortic stenosis is present.   Pulmonic Valve: The pulmonic valve was normal in structure. Pulmonic valve  regurgitation is not visualized. No evidence of pulmonic stenosis.   Aorta: The aortic root is normal in size and structure.   Venous: The inferior vena cava is normal in size with greater than 50%  respiratory variability, suggesting right atrial pressure of 3 mmHg.   IAS/Shunts: No atrial level shunt detected by color flow Doppler.        Zio monitor The patient wore  the monitor for 13 days, 21 hours starting April 17, 2021.   Indication: Palpitations   The minimum heart rate was 46 bpm, maximum heart rate was 149 bpm, and average heart rate was 68 bpm. Predominant underlying rhythm was Sinus Rhythm.   3 Supraventricular Tachycardia runs occurred, the run with the fastest interval lasting 4 beats with a max rate of 126 bpm, the longest lasting 7 beats with an avg rate of 103 bpm.     Atrial Flutter occurred (3% burden), ranging from 46-149 bpm (avg of 82 bpm), the longest lasting 1 hour 41 mins with an avg rate of 92 bpm.   Premature  atrial complexes were occasional (1.0%, 13493). Premature Ventricular complexes were rare less than 1%. No ventricular tachycardia, no pauses present.   Symptoms are associated with atrial flutter, sinus rhythm and premature atrial complexes.       Conclusion: This study is remarkable for the following:                       1.  Symptomatic atrial flutter                       2.  Occasional premature atrial complexes.  Recent Labs: 12/03/2021: ALT 13 02/03/2022: BUN 27; Creatinine, Ser 1.20; Hemoglobin 12.9; Magnesium 2.4; Platelets 156; Potassium 4.8; Sodium 142  Recent Lipid Panel    Component Value Date/Time   CHOL 150 10/13/2021 1314   CHOL 140 04/16/2015 0000   CHOL 140 04/16/2015 0000   TRIG 168.0 (H) 10/13/2021 1314   TRIG 94 04/16/2015 0000   TRIG 94 04/16/2015 0000   HDL 35.30 (L) 10/13/2021 1314   CHOLHDL 4 10/13/2021 1314   VLDL 33.6 10/13/2021 1314   LDLCALC 81 10/13/2021 1314   LDLCALC 86 04/16/2015 0000   LDLCALC 86 04/16/2015 0000    Physical Exam:    VS:  BP 140/60 (BP Location: Left Arm, Patient Position: Sitting)    Pulse 71    Ht 5\' 9"  (1.753 m)    Wt 154 lb 9.6 oz (70.1 kg)    SpO2 98%    BMI 22.83 kg/m     Wt Readings from Last 3 Encounters:  02/03/22 154 lb 9.6 oz (70.1 kg)  12/03/21 153 lb 8 oz (69.6 kg)  11/17/21 149 lb 7 oz (67.8 kg)     GEN: Well nourished, well developed in  no acute distress HEENT: Normal NECK: No JVD; No carotid bruits LYMPHATICS: No lymphadenopathy CARDIAC: S1S2 noted,RRR, no murmurs, rubs, gallops RESPIRATORY:  Clear to auscultation without rales, wheezing or rhonchi  ABDOMEN: Soft, non-tender, non-distended, +bowel sounds, no guarding. EXTREMITIES: No edema, No cyanosis, no clubbing MUSCULOSKELETAL:  No deformity  SKIN: Warm and dry NEUROLOGIC:  Alert and oriented x 3, non-focal PSYCHIATRIC:  Normal affect, good insight  ASSESSMENT:    1. Medication management   2. Typical atrial flutter ([place])   3. Mixed hyperlipidemia    PLAN:    He has not been able to tolerate Cardizem as well as metoprolol.  He is having intermittent cramping on both medications in the past.  I like to try the patient on low-dose carvedilol.  I have educated patient to notify my office immediately if he experienced any symptoms with his medication.  Hyperlipidemia - continue with current statin medication.  Blood work will be done today for BMP, mag, CBC.  The patient is in agreement with the above plan. The patient left the office in stable condition.  The patient will follow up in   Medication Adjustments/Labs and Tests Ordered: Current medicines are reviewed at length with the patient today.  Concerns regarding medicines are outlined above.  Orders Placed This Encounter  Procedures   Basic Metabolic Panel (BMET)   Magnesium   CBC with Differential/Platelet   Meds ordered this encounter  Medications   carvedilol (COREG) 6.25 MG tablet    Sig: Take 1 tablet (6.25 mg total) by mouth 2 (two) times daily.    Dispense:  180 tablet    Refill:  3    Patient Instructions  Medication Instructions:  Your physician  has recommended you make the following change in your medication:  STOP: Cardizem  START: Coreg 6.25 mg twice daily *If you need a refill on your cardiac medications before your next appointment, please call your pharmacy*   Lab  Work: Your physician recommends that you return for lab work in:  TODAY: BMET, Mag, CBC If you have labs (blood work) drawn today and your tests are completely normal, you will receive your results only by: MyChart Message (if you have MyChart) OR A paper copy in the mail If you have any lab test that is abnormal or we need to change your treatment, we will call you to review the results.   Testing/Procedures: None   Follow-Up: [facility], you and your health needs are our priority.  As part of our continuing mission to provide you with exceptional heart care, we have created designated Provider Care Teams.  These Care Teams include your primary Cardiologist (physician) and Advanced Practice Providers (APPs -  Physician Assistants and Nurse Practitioners) who all work together to provide you with the care you need, when you need it.  We recommend signing up for the patient portal called "MyChart".  Sign up information is provided on this After Visit Summary.  MyChart is used to connect with patients for Virtual Visits (Telemedicine).  Patients are able to view lab/test results, encounter notes, upcoming appointments, etc.  Non-urgent messages can be sent to your provider as well.   To learn more about what you can do with MyChart, go to [email or web address].    Your next appointment:   4 month(s)  The format for your next appointment:   In Person  Provider:   [name], DO     Other Instructions     Adopting a Healthy Lifestyle.  Know what a healthy weight is for you (roughly BMI <25) and aim to maintain this   Aim for 7+ servings of fruits and vegetables daily   65-80+ fluid ounces of water or unsweet tea for healthy kidneys   Limit to max 1 drink of alcohol per day; avoid smoking/tobacco   Limit animal fats in diet for cholesterol and heart health - choose grass fed whenever available   Avoid highly processed foods, and foods high in saturated/trans fats    Aim for low stress - take time to unwind and care for your mental health   Aim for 150 min of moderate intensity exercise weekly for heart health, and weights twice weekly for bone health   Aim for 7-9 hours of sleep daily   When it comes to diets, agreement about the perfect plan isnt easy to find, even among the experts. Experts at the [employer] developed an idea known as the Healthy Eating Plate. Just imagine a plate divided into logical, healthy portions.   The emphasis is on diet quality:   Load up on vegetables and fruits - one-half of your plate: Aim for color and variety, and remember that potatoes dont count.   Go for whole grains - one-quarter of your plate: Whole wheat, barley, wheat berries, quinoa, oats, brown rice, and foods made with them. If you want pasta, go with whole wheat pasta.   Protein power - one-quarter of your plate: Fish, chicken, beans, and nuts are all healthy, versatile protein sources. Limit red meat.   The diet, however, does go beyond the plate, offering a few other suggestions.   Use healthy plant oils, such as olive, canola, soy, corn,  sunflower and peanut. Check the labels, and avoid partially hydrogenated oil, which have unhealthy trans fats.   If youre thirsty, drink water. Coffee and tea are good in moderation, but skip sugary drinks and limit milk and dairy products to one or two daily servings.   The type of carbohydrate in the diet is more important than the amount. Some sources of carbohydrates, such as vegetables, fruits, whole grains, and beans-are healthier than others.   Finally, stay active  Signed, [name], DO  02/04/2022 9:16 AM    [place]

## 2022-02-04 ENCOUNTER — Encounter: Payer: Self-pay | Admitting: Hematology and Oncology

## 2022-02-04 LAB — BASIC METABOLIC PANEL
BUN/Creatinine Ratio: 23 (ref 10–24)
BUN: 27 mg/dL (ref 8–27)
CO2: 30 mmol/L — ABNORMAL HIGH (ref 20–29)
Calcium: 9.7 mg/dL (ref 8.6–10.2)
Chloride: 102 mmol/L (ref 96–106)
Creatinine, Ser: 1.2 mg/dL (ref 0.76–1.27)
Glucose: 110 mg/dL — ABNORMAL HIGH (ref 70–99)
Potassium: 4.8 mmol/L (ref 3.5–5.2)
Sodium: 142 mmol/L (ref 134–144)
eGFR: 62 mL/min/{1.73_m2} (ref >59)

## 2022-02-04 LAB — CBC WITH DIFFERENTIAL/PLATELET
Basophils Absolute: 0.1 10*3/uL (ref 0.0–0.2)
Basos: 0 %
EOS (ABSOLUTE): 0.5 10*3/uL — ABNORMAL HIGH (ref 0.0–0.4)
Eos: 0 %
Hematocrit: 39 % (ref 37.5–51.0)
Hemoglobin: 12.9 g/dL — ABNORMAL LOW (ref 13.0–17.7)
Immature Grans (Abs): 0 10*3/uL (ref 0.0–0.1)
Immature Granulocytes: 0 %
Lymphocytes Absolute: 121.6 10*3/uL — ABNORMAL HIGH (ref 0.7–3.1)
Lymphs: 95 %
MCH: 31.2 pg (ref 26.6–33.0)
MCHC: 33.1 g/dL (ref 31.5–35.7)
MCV: 94 fL (ref 79–97)
Monocytes Absolute: 2.6 10*3/uL — ABNORMAL HIGH (ref 0.1–0.9)
Monocytes: 2 %
Neutrophils Absolute: 3.7 10*3/uL (ref 1.4–7.0)
Neutrophils: 3 %
Platelets: 156 10*3/uL (ref 150–450)
RBC: 4.14 x10E6/uL (ref 4.14–5.80)
RDW: 12.7 % (ref 11.6–15.4)
WBC: 129 10*3/uL (ref 3.4–10.8)

## 2022-02-04 LAB — MAGNESIUM: Magnesium: 2.4 mg/dL — ABNORMAL HIGH (ref 1.6–2.3)

## 2022-03-04 ENCOUNTER — Inpatient Hospital Stay: Payer: Medicare HMO | Attending: Hematology and Oncology

## 2022-03-04 ENCOUNTER — Other Ambulatory Visit: Payer: Self-pay | Admitting: Hematology and Oncology

## 2022-03-04 ENCOUNTER — Telehealth: Payer: Self-pay | Admitting: Medical Oncology

## 2022-03-04 ENCOUNTER — Inpatient Hospital Stay: Payer: Medicare HMO | Admitting: Hematology and Oncology

## 2022-03-04 ENCOUNTER — Other Ambulatory Visit: Payer: Self-pay

## 2022-03-04 VITALS — BP 135/63 | HR 63 | Temp 98.1°F | Resp 16 | Wt 156.7 lb

## 2022-03-04 DIAGNOSIS — I4892 Unspecified atrial flutter: Secondary | ICD-10-CM | POA: Diagnosis not present

## 2022-03-04 DIAGNOSIS — C8307 Small cell B-cell lymphoma, spleen: Secondary | ICD-10-CM | POA: Insufficient documentation

## 2022-03-04 DIAGNOSIS — D72820 Lymphocytosis (symptomatic): Secondary | ICD-10-CM

## 2022-03-04 DIAGNOSIS — R110 Nausea: Secondary | ICD-10-CM | POA: Insufficient documentation

## 2022-03-04 DIAGNOSIS — D696 Thrombocytopenia, unspecified: Secondary | ICD-10-CM | POA: Insufficient documentation

## 2022-03-04 DIAGNOSIS — R161 Splenomegaly, not elsewhere classified: Secondary | ICD-10-CM | POA: Diagnosis not present

## 2022-03-04 LAB — CBC WITH DIFFERENTIAL (CANCER CENTER ONLY)
Abs Immature Granulocytes: 0.38 10*3/uL — ABNORMAL HIGH (ref 0.00–0.07)
Basophils Absolute: 0.1 10*3/uL (ref 0.0–0.1)
Basophils Relative: 0 %
Eosinophils Absolute: 0.5 10*3/uL (ref 0.0–0.5)
Eosinophils Relative: 0 %
HCT: 37.8 % — ABNORMAL LOW (ref 39.0–52.0)
Hemoglobin: 12 g/dL — ABNORMAL LOW (ref 13.0–17.0)
Immature Granulocytes: 0 %
Lymphocytes Relative: 95 %
Lymphs Abs: 123.4 10*3/uL — ABNORMAL HIGH (ref 0.7–4.0)
MCH: 31 pg (ref 26.0–34.0)
MCHC: 31.7 g/dL (ref 30.0–36.0)
MCV: 97.7 fL (ref 80.0–100.0)
Monocytes Absolute: 3.4 10*3/uL — ABNORMAL HIGH (ref 0.1–1.0)
Monocytes Relative: 3 %
Neutro Abs: 3.3 10*3/uL (ref 1.7–7.7)
Neutrophils Relative %: 2 %
Platelet Count: 121 10*3/uL — ABNORMAL LOW (ref 150–400)
RBC: 3.87 MIL/uL — ABNORMAL LOW (ref 4.22–5.81)
RDW: 14.6 % (ref 11.5–15.5)
Smear Review: NORMAL
WBC Count: 131.1 10*3/uL (ref 4.0–10.5)
nRBC: 0 % (ref 0.0–0.2)

## 2022-03-04 LAB — CMP (CANCER CENTER ONLY)
ALT: 17 U/L (ref 0–44)
AST: 30 U/L (ref 15–41)
Albumin: 4 g/dL (ref 3.5–5.0)
Alkaline Phosphatase: 67 U/L (ref 38–126)
Anion gap: 5 (ref 5–15)
BUN: 29 mg/dL — ABNORMAL HIGH (ref 8–23)
CO2: 32 mmol/L (ref 22–32)
Calcium: 9 mg/dL (ref 8.9–10.3)
Chloride: 101 mmol/L (ref 98–111)
Creatinine: 1.13 mg/dL (ref 0.61–1.24)
GFR, Estimated: >60 mL/min (ref >60)
Glucose, Bld: 140 mg/dL — ABNORMAL HIGH (ref 70–99)
Potassium: 4.7 mmol/L (ref 3.5–5.1)
Sodium: 138 mmol/L (ref 135–145)
Total Bilirubin: 0.4 mg/dL (ref 0.3–1.2)
Total Protein: 6.9 g/dL (ref 6.5–8.1)

## 2022-03-04 LAB — LACTATE DEHYDROGENASE: LDH: 153 U/L (ref 98–192)

## 2022-03-04 NOTE — Progress Notes (Signed)
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 Referred to Emergency Department. 02/05/2021: establish care with Dr. Lorenso Courier  02/24/2021: Week 1 of Monotherapy Rituximab 03/03/2021:  Week 2 of Monotherapy Rituximab 03/10/2021:  Week 3 of Monotherapy Rituximab. WBC 109.8 03/17/2021: Week 4 of Monotherapy Rituximab. WBC 94.8 03/24/2021: WBC 59.8 05/01/2021: WBC 89.1, Hgb 13.5, Plt 136 09/03/2021: WBC 97.8, Hgb 12.9, Plt 141 12/03/2021: WBC 137.8, Hgb 13.6, MCV 96.1, Plt 130 03/04/2022: WBC 131.1, Hgb 12.0, MCV 97.7, Plt 121  Interval History:  Keith Jordan 81 y.o. male with medical history significant for splenic marginal zone lymphoma who presents for a follow up visit. The patient's last visit was on 12/03/2021. In the interim since the last visit he has been in stable health.   On exam today Keith Jordan reports he has been well overall in the interim since her last visit.  He reports that his energy levels have been good and he is even able to do yard work.  He reports that he is "still out of the 100%".  He does struggle with some arthritis predominately in his right knee.  His appetite has been good and he has not been experiencing any bleeding, bruising, or dark stools.  He also notes no bumps or lumps or concerning signs for lymphadenopathy.  He reports that he does feel like his feet are warm on occasion and thinks may be a side effect due to one of his medications.  He notes no abdominal fullness or early satiety.  He denies any fevers, chills, sweats, nausea, vomiting or diarrhea.  A  full 10 point ROS is listed below.  MEDICAL HISTORY:  Past Medical History:  Diagnosis Date   Abnormal breath sounds 10/11/2020   Acute sinusitis 01/29/2021   Advanced care planning/counseling discussion 09/01/2016   BPH (benign prostatic hyperplasia) 09/29/2018   Ear fullness, bilateral 04/03/2021   Health maintenance examination 09/01/2016   Hyperlipidemia    Irregular heart beat 03/24/2021   Ischemic optic neuropathy 2006   [place] [facility]   Leukocytosis 01/30/2021   Severe s/p ER eval 01/2021   Medicare annual wellness visit, subsequent 10/04/2019   Nausea without vomiting 03/24/2021   Personal history of colonic adenoma 07/30/2008   Prediabetes 07/05/2016   A1c 6.5% 03/2014    Prediabetes 07/05/2016   A1c 6.5% 03/2014    Primary osteoarthritis of knee    bilateral s/p L knee replacement, receives R knee injections [name], [name])   Sebaceous cyst 01/30/2020   Splenic marginal zone b-cell lymphoma ([place]) 02/05/2021    SURGICAL HISTORY: Past Surgical History:  Procedure Laterality Date   A-FLUTTER ABLATION N/A 07/11/2021   Procedure: A-FLUTTER ABLATION;  Surgeon: [name], MD;  Location: [place] [facility];  Service: Cardiovascular;  Laterality: N/A;   COLONOSCOPY  12/2013   no polyps, no rpt due [name])   CYST EXCISION  01/2020   TOTAL KNEE ARTHROPLASTY Left 2005   [name]    SOCIAL HISTORY: Social History   Socioeconomic History   Marital status: Married    Spouse name: Not on file   Number of children: Not on file   Years of  education: Not on file   Highest education level: Not on file  Occupational History   Not on file  Tobacco Use   Smoking status: Never   Smokeless tobacco: Never  Vaping Use   Vaping Use: Never used  Substance and Sexual Activity   Alcohol use: Yes    Comment: occasional   Drug use: No   Sexual activity: Not Currently  Other Topics Concern   Not on file  Social History Narrative   Lives with wife, 2 cats   Occ: retired Personal assistant    Activity: works in yard   Diet: some water, fruits/vegetables daily   Social Determinants of Radio broadcast assistant Strain: Low Risk    Difficulty of Paying Living Expenses: Not very hard  Food Insecurity: Not on file  Transportation Needs: Not on file  Physical Activity: Not on file  Stress: Not on file  Social Connections: Not on file  Intimate Partner Violence: Not on file    FAMILY HISTORY: Family History  Problem Relation Age of Onset   Blindness Mother 7       ?stroke in eyes   Lung disease Father        Ecologist   Arthritis Father    Colon cancer Neg Hx    Pancreatic cancer Neg Hx    Stomach cancer Neg Hx    Esophageal cancer Neg Hx    Rectal cancer Neg Hx    Cancer Neg Hx    Diabetes Neg Hx    CAD Neg Hx    Stroke Neg Hx     ALLERGIES:  has No Known Allergies.  MEDICATIONS:  Current Outpatient Medications  Medication Sig Dispense Refill   acetaminophen (TYLENOL) 500 MG tablet Take 500 mg by mouth every 6 (six) hours as needed for mild pain.     Ascorbic Acid (VITAMIN C) 1000 MG tablet Take 500 mg by mouth 2 (two) times daily.     atorvastatin (LIPITOR) 10 MG tablet Take 1 tablet (10 mg total) by mouth daily. 90 tablet 3   carboxymethylcellulose (REFRESH PLUS) 0.5 % SOLN Place 1 drop into both eyes 2 (two) times daily as needed (dry eyes).     carvedilol (COREG) 6.25 MG tablet Take 1 tablet (6.25 mg total) by mouth 2 (two) times daily. 180 tablet 3   ELIQUIS 5 MG TABS tablet Take 5 mg by mouth 2 (two) times daily.     Multiple Vitamins-Minerals (MULTIVITAMIN WITH MINERALS) tablet Take 1 tablet by mouth daily. Unknown strenght     Multiple Vitamins-Minerals (PRESERVISION AREDS) CAPS Take 1 capsule by mouth in the morning and at bedtime. (Patient taking differently: Take 1 capsule by mouth in the morning and at bedtime. Unknown strenght) 180 capsule 3   ondansetron (ZOFRAN) 8 MG tablet Take 1 tablet (8 mg total) by mouth every 8 (eight) hours as needed for  nausea or vomiting. 30 tablet 0   sodium chloride (OCEAN) 0.65 % SOLN nasal spray Place 1 spray into both nostrils as needed for congestion.     triamcinolone (NASACORT) 55 MCG/ACT AERO nasal inhaler Place 1 spray into the nose daily as needed (sinus congestion). 1 each    No current facility-administered medications for this visit.    REVIEW OF SYSTEMS:   Constitutional: ( - ) fevers, ( - )  chills , ( - ) night sweats Eyes: ( - ) blurriness of vision, ( - ) double vision, ( - ) watery eyes Ears, nose, mouth,  throat, and face: ( - ) mucositis, ( - ) sore throat Respiratory: ( - ) cough, ( - ) dyspnea, ( - ) wheezes Cardiovascular: ( - ) palpitation, ( - ) chest discomfort, ( - ) lower extremity swelling Gastrointestinal:  ( - ) nausea, ( - ) heartburn, ( - ) change in bowel habits Skin: ( - ) abnormal skin rashes Lymphatics: ( - ) new lymphadenopathy, ( - ) easy bruising Neurological: ( - ) numbness, ( -) tingling, ( - ) new weaknesses Behavioral/Psych: ( - ) mood change, ( - ) new changes  All other systems were reviewed with the patient and are negative.  PHYSICAL EXAMINATION: ECOG PERFORMANCE STATUS: 1 - Symptomatic but completely ambulatory  Vitals:   03/04/22 1502  BP: 135/63  Pulse: 63  Resp: 16  Temp: 98.1 F (36.7 C)  SpO2: 98%    Filed Weights   03/04/22 1502  Weight: 156 lb 11.2 oz (71.1 kg)    GENERAL: well appearing elderly Caucasian male alert, no distress and comfortable SKIN: skin color, texture, turgor are normal, no rashes or significant lesions EYES: conjunctiva are pink and non-injected, sclera clear LUNGS: clear to auscultation and percussion with normal breathing effort HEART: regular rate & rhythm and no murmurs and no lower extremity edema Musculoskeletal: no cyanosis of digits and no clubbing  PSYCH: alert & oriented x 3, fluent speech NEURO: no focal motor/sensory deficits  LABORATORY DATA:  I have reviewed the data as listed CBC Latest Ref  Rng & Units 03/04/2022 02/03/2022 12/03/2021  WBC 4.0 - 10.5 K/uL 131.1(HH) 129.0(HH) 137.8(HH)  Hemoglobin 13.0 - 17.0 g/dL 12.0(L) 12.9(L) 13.6  Hematocrit 39.0 - 52.0 % 37.8(L) 39.0 41.8  Platelets 150 - 400 K/uL 121(L) 156 130(L)    CMP Latest Ref Rng & Units 03/04/2022 02/03/2022 12/03/2021  Glucose 70 - 99 mg/dL 140(H) 110(H) 126(H)  BUN 8 - 23 mg/dL 29(H) 27 25(H)  Creatinine 0.61 - 1.24 mg/dL 1.13 1.20 1.23  Sodium 135 - 145 mmol/L 138 142 143  Potassium 3.5 - 5.1 mmol/L 4.7 4.8 4.4  Chloride 98 - 111 mmol/L 101 102 104  CO2 22 - 32 mmol/L 32 30(H) 28  Calcium 8.9 - 10.3 mg/dL 9.0 9.7 9.2  Total Protein 6.5 - 8.1 g/dL 6.9 - 6.8  Total Bilirubin 0.3 - 1.2 mg/dL 0.4 - 0.7  Alkaline Phos 38 - 126 U/L 67 - 83  AST 15 - 41 U/L 30 - 25  ALT 0 - 44 U/L 17 - 13   RADIOGRAPHIC STUDIES: No results found.   ASSESSMENT & PLAN Keith Jordan 81 y.o. male with medical history significant for splenic marginal zone lymphoma. CT CAP from 02/13/21  showed only splenic involvement and a bone marrow biopsy from 02/21/21 confirmed the diagnosis.  The patient has only leukocytosis and splenomegaly.  The recommendation is monotherapy rituximab 375 mg per metered squared q. 7 days x 4 doses. He completed this on 03/17/2021.   At this time is marginal zone lymphoma appears like an indolent lymphocyte predominant variant.  These tend to behave more like CLL.  Given that I would recommend holding on treatment unless the patient were to have difficulties with splenomegaly, anemia, or worsening thrombocytopenia.  For treatment choices one could consider treatment with Ibrutinib/Zanbrutinib vs Bendamustine + ritux/obinutuzumab. Would need caution with ibrutinib given patient's cardiac history and current anticoagulation therapy.    Patient is doing well without any significant limitations. Labs from today show lymphocytosis and modest  stable thrombocytopenia. Plan for patient to return to the clinic in 12  weeks.  #Splenic Marginal Zone Lymphoma, stable --findings are consistent with a splenic marginal zone lymphoma. --patient completed bone marrow biopsy and a CT chest abdomen pelvis. Disease appear limited to peripheral blood and spleen.  --Treatment of choice with confirmed splenic marginal zone lymphoma is rituximab 375 mg/m q. 7 days x 4 doses. He completed this on 03/17/2021.  --Due to persistent lymphocytosis, recommend to continue with allopurinol to prevent TLS. --if there is concern for worsening disease with an indication for treatment can consider treatment with Ibrutinib/Zanbrutinib vs Bendamustine + ritux/obinutuzumab. Would need caution with ibrutinib given patient's cardiac history and current anticoagulation therapy.  --RTC in 3 months with labs   #Thrombocytopenia --Plt today stable at 121 --likely 2/2 to splenomegaly.   #Nausea --Take Zofran 8 mg q 8 hours PRN.   #Atrial Flutter --patient follows with Cardiology  No orders of the defined types were placed in this encounter.   All questions were answered. The patient knows to call the clinic with any problems, questions or concerns.  A total of more than 30 minutes were spent on this encounter and over half of that time was spent on counseling and coordination of care as outlined above.   Ledell Peoples, MD Department of Hematology/Oncology [place] at [facility] Phone: [phone] Pager: [phone] Email: [email or web address]

## 2022-03-04 NOTE — Telephone Encounter (Signed)
CRITICAL VALUE STICKER ? ?CRITICAL VALUE: WBC=131.1k ? ?RECEIVER (on-site recipient of call):Avien Taha ? ?DATE & TIME NOTIFIED: 03/04/22 ? ?MESSENGER (representative from lab):lab ? ?MD NOTIFIED: Lorenso Courier ? ?TIME OF NOTIFICATION:1510 ? ?RESPONSE:  He is aware .Pt has leukocytosis ?

## 2022-03-11 ENCOUNTER — Telehealth: Payer: Self-pay

## 2022-03-11 NOTE — Progress Notes (Signed)
? ? ?Chronic Care Management ?Pharmacy Assistant  ? ?Name: Keith Jordan  MRN: [id number] DOB: 18-Sep-1941 ? ?Reason for Encounter: CCM (General Adherence) ?  ?Recent office visits:  ?11/17/2021 - [name], MD - Insomnia - Positive protein POCT Urinalysis.  ?11/03/2021 - [name], MD -Telephone - Stop due to balance issues: (NASACORT). Start: OTC Flonase.  ?10/13/2021 [name], MD - Annual Wellness Visit - Abnormal Labs "White cells very high WBC 99.3, platelets low - both stable. No longer anemic. Cholesterol levels are overall well controlled on low dose lipitor. Kidney function remains slightly impaired but stable" No med changes.  ? ?Recent consult visits:  ?03/04/2022 - Narda Rutherford - Oncology - splenic marginal zone b-cell lymphoma.Abnormal Lab: WBC=131.1k ?02/03/2022 - [name], DO - Cardiology - Med Management - Abnormal labs: No provider notes. Start: COREG - 6.25 MG tab. Stop: CARDIZEM CD and TOPROL-XL. ?01/21/2022 Godfrey Pick [place], DO - Cardiology - Start due to heart racing and palpitations Cardizem 120 mg.  ?12/11/2021 - Godfrey Pick Tobb, DO - Cardiology - STOP due to hot flash in chest, legs and feet Metoprolol.  ?12/03/2021 - Critical Lab - WBC 137.8 ?12/03/2021 - Narda Rutherford, MD - Hematology - splenic marginal zone b-cell lymphoma.  ?11/26/2021 - Benay Pillow - Ophthalmology - macular degeneration.  ?10/21/2021 - Completed  - DG Lumbar Spine ?10/17/2021 - Long Term Monitor Zio applied ?09/26/2021 - [name], MD - Family Medicine - Hyperlipidemia. No other information. ? ?Hospital visits:  ?None in previous 6 months ? ?Medications: ?Outpatient Encounter Medications as of 03/11/2022  ?Medication Sig  ? acetaminophen (TYLENOL) 500 MG tablet Take 500 mg by mouth every 6 (six) hours as needed for mild pain.  ? Ascorbic Acid (VITAMIN C) 1000 MG tablet Take 500 mg by mouth 2 (two) times daily.  ? atorvastatin (LIPITOR) 10 MG tablet Take 1 tablet (10 mg total) by mouth daily.  ?  carboxymethylcellulose (REFRESH PLUS) 0.5 % SOLN Place 1 drop into both eyes 2 (two) times daily as needed (dry eyes).  ? carvedilol (COREG) 6.25 MG tablet Take 1 tablet (6.25 mg total) by mouth 2 (two) times daily.  ? ELIQUIS 5 MG TABS tablet Take 5 mg by mouth 2 (two) times daily.  ? Multiple Vitamins-Minerals (MULTIVITAMIN WITH MINERALS) tablet Take 1 tablet by mouth daily. Unknown strenght  ? Multiple Vitamins-Minerals (PRESERVISION AREDS) CAPS Take 1 capsule by mouth in the morning and at bedtime. (Patient taking differently: Take 1 capsule by mouth in the morning and at bedtime. Unknown strenght)  ? ondansetron (ZOFRAN) 8 MG tablet Take 1 tablet (8 mg total) by mouth every 8 (eight) hours as needed for nausea or vomiting.  ? sodium chloride (OCEAN) 0.65 % SOLN nasal spray Place 1 spray into both nostrils as needed for congestion.  ? triamcinolone (NASACORT) 55 MCG/ACT AERO nasal inhaler Place 1 spray into the nose daily as needed (sinus congestion).  ? ?No facility-administered encounter medications on file as of 03/11/2022.  ? ?Contacted Keith Jordan on 03/12/2022 for general disease state and medication adherence call.  ? ?Patient is not more than 5 days past due for refill on the following medications per chart history: ? ?Star Medications: ?Medication Name/mg Last Fill Days Supply ?Atorvastatin 10 mg  01/25/2022 90 ? ?What concerns do you have about your medications? Please see side effects below.  ? ?The patient reports the following side effects with their medications. Patient states he is still having warming in his feet. His legs  don't get warm anymore only his feet and toes. They don't get as hot on Coreg 6.25 mg as before, but it is still bothersome. It is better on this medication, but he is also still waking up in the middle of the night. Patient is wondering if he can take a lower dose.  ? ?How often do you forget or accidentally miss a dose? Never ? ?Do you use a pillbox? Yes ? ?Are you having  any problems getting your medications from your pharmacy? No ? ?Has the cost of your medications been a concern? No ? ?Since last visit with CPP, the following interventions have been made.  ?Start: COREG - 6.25 MG tab.  ?Stop: CARDIZEM CD and TOPROL-XL. ? ?The patient has not had an ED visit since last contact.  ? ?The patient denies problems with their health.  ? ?Patient reports the following concerns or questions for Charlene Brooke, PharmD at this time. Please see the side effects.  ? ?Care Gaps: ?Annual wellness visit in last year? Yes 10/13/2021 ?Most Recent BP reading: 135/63 on 03/04/2022 ? ?Upcoming appointments: ?PCP appointment on 04/13/2022 ? ?Charlene Brooke, CPP notified ? ?Marijean Niemann, RMA ?Clinical Pharmacy Assistant ?[phone] ? ? ? ? ? ? ? ? ?

## 2022-03-31 ENCOUNTER — Other Ambulatory Visit: Payer: Self-pay | Admitting: Cardiology

## 2022-03-31 DIAGNOSIS — I483 Typical atrial flutter: Secondary | ICD-10-CM

## 2022-03-31 NOTE — Telephone Encounter (Signed)
Prescription refill request for Eliquis received. ?Indication:Afib  ?Last office visit: 02/03/22 ([place])  ?Scr: 1.13 (03/04/22)  ?Age: 81 ?Weight: 71.1kg ? ?Appropriate dose and refill sent to requested pharmacy.  ?

## 2022-04-07 ENCOUNTER — Ambulatory Visit (INDEPENDENT_AMBULATORY_CARE_PROVIDER_SITE_OTHER): Payer: Medicare HMO | Admitting: Family Medicine

## 2022-04-07 ENCOUNTER — Encounter: Payer: Self-pay | Admitting: Family Medicine

## 2022-04-07 VITALS — BP 138/70 | HR 61 | Temp 98.0°F | Ht 69.0 in | Wt 153.0 lb

## 2022-04-07 DIAGNOSIS — L089 Local infection of the skin and subcutaneous tissue, unspecified: Secondary | ICD-10-CM

## 2022-04-07 DIAGNOSIS — S90561A Insect bite (nonvenomous), right ankle, initial encounter: Secondary | ICD-10-CM

## 2022-04-07 DIAGNOSIS — W57XXXA Bitten or stung by nonvenomous insect and other nonvenomous arthropods, initial encounter: Secondary | ICD-10-CM

## 2022-04-07 MED ORDER — CEPHALEXIN 500 MG PO CAPS: 500.0000 mg | ORAL_CAPSULE | Freq: Two times a day (BID) | ORAL | 0 refills | Status: AC

## 2022-04-07 NOTE — Progress Notes (Signed)
? ?  Subjective:  ? ?  ?Keith Jordan is a 81 y.o. male presenting for Insect Bite (R outer calf. Not sure what bit him. Red and warm to touch since last Thursday. ) ?  ? ? ?HPI ? ?#insect bite ?- noticed last Thursday ?- was working on the yard all last week ?- did find a tick on the abdomen ?- dug up his garden ?- no rashes, body aches, fevers, chills, n/v, diarrhea ?- initially had some stiffness in the leg - is now walking better ?- redness seems to be stable - maybe a little less ?- has been using cortisone ?- it is painful with touching  ?- not itchy ? ? ?Review of Systems ? ? ?Social History  ? ?Tobacco Use  ?Smoking Status Never  ?Smokeless Tobacco Never  ? ? ? ?   ?Objective:  ?  ?BP Readings from Last 3 Encounters:  ?04/07/22 138/70  ?03/04/22 135/63  ?02/03/22 140/60  ? ?Wt Readings from Last 3 Encounters:  ?04/07/22 153 lb (69.4 kg)  ?03/04/22 156 lb 11.2 oz (71.1 kg)  ?02/03/22 154 lb 9.6 oz (70.1 kg)  ? ? ?BP 138/70   Pulse 61   Temp 98 ?F (36.7 ?C) (Oral)   Ht '5\' 9"'$  (1.753 m)   Wt 153 lb (69.4 kg)   SpO2 98%   BMI 22.59 kg/m?  ? ? ?Physical Exam ?Constitutional:   ?   Appearance: Normal appearance. He is not ill-appearing or diaphoretic.  ?HENT:  ?   Right Ear: External ear normal.  ?   Left Ear: External ear normal.  ?   Nose: Nose normal.  ?Eyes:  ?   General: No scleral icterus. ?   Extraocular Movements: Extraocular movements intact.  ?   Conjunctiva/sclera: Conjunctivae normal.  ?Cardiovascular:  ?   Rate and Rhythm: Normal rate.  ?Pulmonary:  ?   Effort: Pulmonary effort is normal.  ?Musculoskeletal:  ?   Cervical back: Neck supple.  ?Skin: ?   General: Skin is warm and dry.  ?   Comments: Right external ankle - with local erythema and warmth as well as mild induration centrally.   ?Neurological:  ?   Mental Status: He is alert. Mental status is at baseline.  ?Psychiatric:     ?   Mood and Affect: Mood normal.  ? ? ? ? ? ?   ?Assessment & Plan:  ? ?Problem List Items Addressed This Visit    ?None ?Visit Diagnoses   ? ? Infected insect bite of ankle, right, initial encounter    -  Primary  ? Relevant Medications  ? cephALEXin (KEFLEX) 500 MG capsule  ? ?  ? ?Infected insect bite - treat with abx for cellulitis  ? ?Return if symptoms worsen or fail to improve. ? ?Lesleigh Noe, MD ? ? ? ?

## 2022-04-07 NOTE — Patient Instructions (Signed)
Take the antibiotics ? ?Call if worsening redness, pain, fever, chills or no improvement.  ?

## 2022-04-13 ENCOUNTER — Encounter: Payer: Self-pay | Admitting: Family Medicine

## 2022-04-13 ENCOUNTER — Ambulatory Visit (INDEPENDENT_AMBULATORY_CARE_PROVIDER_SITE_OTHER): Payer: Medicare HMO | Admitting: Family Medicine

## 2022-04-13 VITALS — BP 138/68 | HR 60 | Temp 97.9°F | Ht 69.0 in | Wt 150.2 lb

## 2022-04-13 DIAGNOSIS — I483 Typical atrial flutter: Secondary | ICD-10-CM | POA: Diagnosis not present

## 2022-04-13 DIAGNOSIS — C8307 Small cell B-cell lymphoma, spleen: Secondary | ICD-10-CM | POA: Diagnosis not present

## 2022-04-13 DIAGNOSIS — G4700 Insomnia, unspecified: Secondary | ICD-10-CM

## 2022-04-13 DIAGNOSIS — L03115 Cellulitis of right lower limb: Secondary | ICD-10-CM

## 2022-04-13 DIAGNOSIS — L03116 Cellulitis of left lower limb: Secondary | ICD-10-CM | POA: Insufficient documentation

## 2022-04-13 DIAGNOSIS — L02415 Cutaneous abscess of right lower limb: Secondary | ICD-10-CM | POA: Diagnosis not present

## 2022-04-13 MED ORDER — DOXYCYCLINE HYCLATE 100 MG PO TABS: 100.0000 mg | ORAL_TABLET | Freq: Two times a day (BID) | ORAL | 0 refills | Status: DC

## 2022-04-13 NOTE — Assessment & Plan Note (Signed)
Appreciate heme/onc care s/p 4 wk rituximab course - unclear if he should still be on allopurinol - I asked him to verify with heme next visit.  ?

## 2022-04-13 NOTE — Assessment & Plan Note (Signed)
Present over the past 11 days. Concern for developing abscess. Offered imaging with ultrasound for further evaluation vs I&D - he declines, as states this is improving on keflex course. Will add doxycycline for staph coverage.  ?Advised f/u if not improving with treatment.  ?

## 2022-04-13 NOTE — Assessment & Plan Note (Signed)
Appreciate cards care. He is now on carvedilol. ?Difficulty tolerating toprol XL and diltiazem (insomnia).  ?

## 2022-04-13 NOTE — Progress Notes (Signed)
? ? Patient ID: Keith Jordan, male    DOB: 04-19-1941, 81 y.o.   MRN: [id number] ? ?This visit was conducted in person. ? ?BP 138/68   Pulse 60   Temp 97.9 ?F (36.6 ?C) (Temporal)   Ht '5\' 9"'$  (1.753 m)   Wt 150 lb 4 oz (68.2 kg)   SpO2 94%   BMI 22.19 kg/m?   ? ?CC: f/u visit  ?Subjective:  ? ?HPI: ?Keith Jordan is a 81 y.o. male presenting on 04/13/2022 for Follow-up (Here for f/u.) ? ? ?Saw Dr Einar Pheasant last week after insect bite to outer right ankle - treated for cellulitis with keflex '500mg'$  BID course x7d. Discomfort and redness is improving but notes persistent swelling.  ? ?Now on carvedilol 6.'25mg'$  bid in place of diltiazem. He finds stopping diltiazem has helped his sleep.  ? 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 Was recommended allopurinol due to persistent lymphocytosis however no longer taking.  ?   ? ?Relevant past medical, surgical, family and social history reviewed and updated as indicated. Interim medical history since our last visit reviewed. ?Allergies and medications reviewed and updated. ?Outpatient Medications Prior to Visit  ?Medication Sig Dispense Refill  ? acetaminophen (TYLENOL) 500 MG tablet Take 500 mg by mouth every 6 (six) hours as needed for mild pain.    ? apixaban (ELIQUIS) 5 MG TABS tablet TAKE 1 TABLET BY MOUTH TWICE A DAY 180 tablet 1  ? Ascorbic Acid (VITAMIN C) 1000 MG tablet Take 500 mg by mouth 2 (two) times daily.    ? atorvastatin (LIPITOR) 10 MG tablet Take 1 tablet (10 mg total) by mouth daily. 90 tablet 3  ? carboxymethylcellulose (REFRESH PLUS) 0.5 % SOLN Place 1 drop into both eyes 2 (two) times daily as needed (dry eyes).    ? carvedilol (COREG) 6.25 MG tablet Take 1 tablet (6.25 mg total) by mouth 2 (two) times daily. 180 tablet 3  ? cephALEXin (KEFLEX) 500 MG capsule Take 1 capsule (500 mg total) by mouth 2 (two) times daily for 7 days. 14 capsule 0  ? Multiple Vitamins-Minerals  (MULTIVITAMIN WITH MINERALS) tablet Take 1 tablet by mouth daily. Unknown strenght    ? Multiple Vitamins-Minerals (PRESERVISION AREDS) CAPS Take 1 capsule by mouth in the morning and at bedtime. (Patient taking differently: Take 1 capsule by mouth in the morning and at bedtime. Unknown strenght) 180 capsule 3  ? ondansetron (ZOFRAN) 8 MG tablet Take 1 tablet (8 mg total) by mouth every 8 (eight) hours as needed for nausea or vomiting. 30 tablet 0  ? sodium chloride (OCEAN) 0.65 % SOLN nasal spray Place 1 spray into both nostrils as needed for congestion.    ? triamcinolone (NASACORT) 55 MCG/ACT AERO nasal inhaler Place 1 spray into the nose daily as needed (sinus congestion). 1 each   ? ?No facility-administered medications prior to visit.  ?  ? ?Per HPI unless specifically indicated in ROS section below ?Review of Systems ? ?Objective:  ?BP 138/68   Pulse 60   Temp 97.9 ?F (36.6 ?C) (Temporal)   Ht '5\' 9"'$  (1.753 m)   Wt 150 lb 4 oz (68.2 kg)   SpO2 94%   BMI 22.19 kg/m?   ?Wt Readings from Last 3 Encounters:  ?04/13/22 150 lb 4 oz (68.2 kg)  ?04/07/22 153 lb (69.4 kg)  ?03/04/22 156 lb 11.2 oz (71.1 kg)  ?  ?  ?Physical  Exam ?Vitals and nursing note reviewed.  ?Constitutional:   ?   Appearance: Normal appearance. He is not ill-appearing.  ?Cardiovascular:  ?   Rate and Rhythm: Normal rate and regular rhythm.  ?   Pulses: Normal pulses.  ?   Heart sounds: Normal heart sounds. No murmur heard. ?Pulmonary:  ?   Effort: Pulmonary effort is normal. No respiratory distress.  ?   Breath sounds: Normal breath sounds. No wheezing, rhonchi or rales.  ?Skin: ?   General: Skin is warm and dry.  ?   Findings: Erythema and lesion present. No rash.  ? ?    ?   Comments: R lateral lower leg with tender swelling present with mild induration and surrounding erythema  ?Neurological:  ?   Mental Status: He is alert.  ?Psychiatric:     ?   Mood and Affect: Mood normal.     ?   Behavior: Behavior normal.  ? ?   ?Results for orders  placed or performed in visit on 03/04/22  ?Lactate dehydrogenase (LDH)  ?Result Value Ref Range  ? LDH 153 98 - 192 U/L  ?CMP ([facility] only)  ?Result Value Ref Range  ? Sodium 138 135 - 145 mmol/L  ? Potassium 4.7 3.5 - 5.1 mmol/L  ? Chloride 101 98 - 111 mmol/L  ? CO2 32 22 - 32 mmol/L  ? Glucose, Bld 140 (H) 70 - 99 mg/dL  ? BUN 29 (H) 8 - 23 mg/dL  ? Creatinine 1.13 0.61 - 1.24 mg/dL  ? Calcium 9.0 8.9 - 10.3 mg/dL  ? Total Protein 6.9 6.5 - 8.1 g/dL  ? Albumin 4.0 3.5 - 5.0 g/dL  ? AST 30 15 - 41 U/L  ? ALT 17 0 - 44 U/L  ? Alkaline Phosphatase 67 38 - 126 U/L  ? Total Bilirubin 0.4 0.3 - 1.2 mg/dL  ? GFR, Estimated >60 >60 mL/min  ? Anion gap 5 5 - 15  ?CBC with Differential ([place] [facility] Only)  ?Result Value Ref Range  ? WBC Count 131.1 (HH) 4.0 - 10.5 K/uL  ? RBC 3.87 (L) 4.22 - 5.81 MIL/uL  ? Hemoglobin 12.0 (L) 13.0 - 17.0 g/dL  ? HCT 37.8 (L) 39.0 - 52.0 %  ? MCV 97.7 80.0 - 100.0 fL  ? MCH 31.0 26.0 - 34.0 pg  ? MCHC 31.7 30.0 - 36.0 g/dL  ? RDW 14.6 11.5 - 15.5 %  ? Platelet Count 121 (L) 150 - 400 K/uL  ? nRBC 0.0 0.0 - 0.2 %  ? Neutrophils Relative % 2 %  ? Neutro Abs 3.3 1.7 - 7.7 K/uL  ? Lymphocytes Relative 95 %  ? Lymphs Abs 123.4 (H) 0.7 - 4.0 K/uL  ? Monocytes Relative 3 %  ? Monocytes Absolute 3.4 (H) 0.1 - 1.0 K/uL  ? Eosinophils Relative 0 %  ? Eosinophils Absolute 0.5 0.0 - 0.5 K/uL  ? Basophils Relative 0 %  ? Basophils Absolute 0.1 0.0 - 0.1 K/uL  ? WBC Morphology VARIANT LYMPHS PRESENT   ? RBC Morphology MORPHOLOGY UNREMARKABLE   ? Smear Review Normal platelet morphology   ? Immature Granulocytes 0 %  ? Abs Immature Granulocytes 0.38 (H) 0.00 - 0.07 K/uL  ? ? ?Assessment & Plan:  ? ?Problem List Items Addressed This Visit   ? ? Splenic marginal zone b-cell lymphoma ([place])  ?  Appreciate heme/onc care s/p 4 wk rituximab course - unclear if he should still be on allopurinol - I  asked him to verify with heme next visit.  ? ?  ?  ? Relevant Medications  ? doxycycline (VIBRA-TABS) 100 MG  tablet  ? Typical atrial flutter ([place])  ?  Appreciate cards care. He is now on carvedilol. ?Difficulty tolerating toprol XL and diltiazem (insomnia).  ? ?  ?  ? Insomnia  ?  This is better off diltiazem.  ? ?  ?  ? Cellulitis and abscess of right leg - Primary  ?  Present over the past 11 days. Concern for developing abscess. Offered imaging with ultrasound for further evaluation vs I&D - he declines, as states this is improving on keflex course. Will add doxycycline for staph coverage.  ?Advised f/u if not improving with treatment.  ? ?  ?  ?  ? ?Meds ordered this encounter  ?Medications  ? doxycycline (VIBRA-TABS) 100 MG tablet  ?  Sig: Take 1 tablet (100 mg total) by mouth 2 (two) times daily.  ?  Dispense:  14 tablet  ?  Refill:  0  ? ?No orders of the defined types were placed in this encounter. ? ? ? ?Patient Instructions  ?Touch base with Dr Lorenso Courier about allopurinol medicine.  ?Finish keflex, add on doxycycline antibiotic 7d course sent to pharmacy. Caution it can make you more prone to sunburn.  ?Let us know if not improving with this - to consider ultrasound and/or drainage.  ? ?Follow up plan: ?Return if symptoms worsen or fail to improve. ? ?[name], MD   ?

## 2022-04-13 NOTE — Assessment & Plan Note (Signed)
This is better off diltiazem.  ?

## 2022-04-13 NOTE — Patient Instructions (Addendum)
Touch base with Dr Lorenso Courier about allopurinol medicine.  ?Finish keflex, add on doxycycline antibiotic 7d course sent to pharmacy. Caution it can make you more prone to sunburn.  ?Let us know if not improving with this - to consider ultrasound and/or drainage.  ?

## 2022-05-26 ENCOUNTER — Ambulatory Visit (INDEPENDENT_AMBULATORY_CARE_PROVIDER_SITE_OTHER)
Admission: RE | Admit: 2022-05-26 | Discharge: 2022-05-26 | Disposition: A | Discharge status: Home / Self Care | Payer: Medicare HMO | Source: Ambulatory Visit | Attending: Nurse Practitioner | Admitting: Nurse Practitioner

## 2022-05-26 ENCOUNTER — Telehealth: Payer: Self-pay | Admitting: Radiology

## 2022-05-26 ENCOUNTER — Ambulatory Visit (INDEPENDENT_AMBULATORY_CARE_PROVIDER_SITE_OTHER): Payer: Medicare HMO | Admitting: Nurse Practitioner

## 2022-05-26 ENCOUNTER — Encounter: Payer: Self-pay | Admitting: Nurse Practitioner

## 2022-05-26 VITALS — BP 124/64 | HR 67 | Temp 97.5°F | Resp 16 | Ht 69.0 in | Wt 152.4 lb

## 2022-05-26 DIAGNOSIS — M79671 Pain in right foot: Secondary | ICD-10-CM | POA: Diagnosis not present

## 2022-05-26 DIAGNOSIS — M25571 Pain in right ankle and joints of right foot: Secondary | ICD-10-CM

## 2022-05-26 DIAGNOSIS — M7989 Other specified soft tissue disorders: Secondary | ICD-10-CM

## 2022-05-26 LAB — CBC
HCT: 40.3 % (ref 39.0–52.0)
Hemoglobin: 12.3 g/dL — ABNORMAL LOW (ref 13.0–17.0)
MCHC: 30.4 g/dL (ref 30.0–36.0)
MCV: 99.8 fl (ref 78.0–100.0)
Platelets: 115 10*3/uL — ABNORMAL LOW (ref 150.0–400.0)
RBC: 4.04 Mil/uL — ABNORMAL LOW (ref 4.22–5.81)
RDW: 15 % (ref 11.5–15.5)
WBC: 140 10*3/uL (ref 4.0–10.5)

## 2022-05-26 LAB — COMPREHENSIVE METABOLIC PANEL
ALT: 10 U/L (ref 0–53)
AST: 23 U/L (ref 0–37)
Albumin: 4 g/dL (ref 3.5–5.2)
Alkaline Phosphatase: 81 U/L (ref 39–117)
BUN: 21 mg/dL (ref 6–23)
CO2: 32 mEq/L (ref 19–32)
Calcium: 9.1 mg/dL (ref 8.4–10.5)
Chloride: 103 mEq/L (ref 96–112)
Creatinine, Ser: 1.09 mg/dL (ref 0.40–1.50)
GFR: 64.24 mL/min (ref >60.00)
Glucose, Bld: 161 mg/dL — ABNORMAL HIGH (ref 70–99)
Potassium: 3.8 mEq/L (ref 3.5–5.1)
Sodium: 142 mEq/L (ref 135–145)
Total Bilirubin: 0.8 mg/dL (ref 0.2–1.2)
Total Protein: 6.4 g/dL (ref 6.0–8.3)

## 2022-05-26 LAB — URIC ACID: Uric Acid, Serum: 4.8 mg/dL (ref 4.0–7.8)

## 2022-05-26 NOTE — Assessment & Plan Note (Signed)
Could be multifactorial.  Patient had [place] soup on the right foot striking the first MTP.  He does have erythema and edema.  Pending x-ray in office did do read personally and did not see any acute fractures.  Also characteristic of possible gout rule out checking blood work.  Patient states he was on a uric acid reducing medication on undergoing cancer treatments but unsure of the medications name

## 2022-05-26 NOTE — Telephone Encounter (Signed)
Noted. Chronic issue, in line with previous measurements, he is followed by oncology.

## 2022-05-26 NOTE — Assessment & Plan Note (Signed)
No injury.  No bruising but there was some edema.  Do feel like the pain in the ankle is secondary to edema.  Pending x-rays

## 2022-05-26 NOTE — Patient Instructions (Signed)
Nice to see you today I will be in touch with the labs and xray results Continue using the tylenol and try volatren gel to the foot Follow up if no improvement

## 2022-05-26 NOTE — Progress Notes (Signed)
   Acute Office Visit  Subjective:     Patient ID: Keith Jordan, male    DOB: 17-Feb-1941, 81 y.o.   MRN: [id number]  Chief Complaint  Patient presents with   Toe Injury    On 05/20/22 dropped a can of soup on his right big toe. Pain was present. Pain got better but then on 05/25/22 developed swelling and pain in the right foot and his ankle area.    HPI Patient is in today for foot pain  States that he dropped a can of soup to his right big toe. States that he did notice some brusing and pain. States that he thought it was getting better over the weekened. He was able to walk without assitance  Come Monday it started sweeling on him  States that movement makes it worse. States that the tylneol did not help. States it was throbbing  Review of Systems  Musculoskeletal:  Positive for joint pain.  Skin:        "+" Ecchymosis "+" Erythema and edema       Objective:    BP 124/64   Pulse 67   Temp (!) 97.5 F (36.4 C)   Resp 16   Ht '5\' 9"'$  (1.753 m)   Wt 152 lb 6 oz (69.1 kg)   SpO2 99%   BMI 22.50 kg/m    Physical Exam Vitals and nursing note reviewed.  Constitutional:      Appearance: Normal appearance.  Cardiovascular:     Rate and Rhythm: Normal rate. Rhythm irregular.     Pulses: Normal pulses.     Heart sounds: Normal heart sounds.  Pulmonary:     Effort: Pulmonary effort is normal.     Breath sounds: Normal breath sounds.  Musculoskeletal:        General: Tenderness and signs of injury present.     Right lower leg: Edema present.       Legs:     Comments: 1+ swelling from ankle to toes. PMS intact  Neurological:     Mental Status: He is alert.    No results found for any visits on 05/26/22.      Assessment & Plan:   Problem List Items Addressed This Visit       Other   Right foot pain - Primary    Could be multifactorial.  Patient had [place] soup on the right foot striking the first MTP.  He does have erythema and edema.  Pending x-ray in  office did do read personally and did not see any acute fractures.  Also characteristic of possible gout rule out checking blood work.  Patient states he was on a uric acid reducing medication on undergoing cancer treatments but unsure of the medications name       Relevant Orders   DG Foot Complete Right (Completed)   CBC   Uric acid   Comprehensive metabolic panel   Acute right ankle pain    No injury.  No bruising but there was some edema.  Do feel like the pain in the ankle is secondary to edema.  Pending x-rays       Relevant Orders   DG Foot Complete Right (Completed)   RESOLVED: Foot swelling    No orders of the defined types were placed in this encounter.   Return if symptoms worsen or fail to improve.  Romilda Garret, NP

## 2022-05-26 NOTE — Telephone Encounter (Signed)
Keith Jordan called a critical WBC 140,000. Results sent to Karl Ito., NP and given on site to Dr Danise Mina

## 2022-05-27 ENCOUNTER — Telehealth: Payer: Self-pay | Admitting: Nurse Practitioner

## 2022-05-27 DIAGNOSIS — M79671 Pain in right foot: Secondary | ICD-10-CM

## 2022-05-27 MED ORDER — PREDNISONE 20 MG PO TABS: 40.0000 mg | ORAL_TABLET | Freq: Every day | ORAL | 0 refills | Status: DC

## 2022-05-27 NOTE — Telephone Encounter (Signed)
So I spoke with Dr. Danise Mina and after discussion I think we should treat for gout. This will be done by using some prednisone. WE will need to get him in office Friday to see Dr Darnell Level. Or myself if he does not have any slots to make sure we are heading in the right direction. Prednisone can raise he sugar levels but I see that he IS NOT diabetic. He needs to take this medication with food as it can be hard on the stomach.

## 2022-05-27 NOTE — Telephone Encounter (Signed)
-----   Message from [place] sent at 05/27/2022 10:35 AM EDT ----- Patient advised. Patient states swelling and redness is about the same-can not say if it is better or worse. Tylenol helps a little with pain but not a lot. He still can not put any weight on the toe/foot area, using ice packs

## 2022-05-27 NOTE — Telephone Encounter (Signed)
Patient advised. Appointment made with Dr Darnell Level for 05/29/22 at 12 pm. Catalina [place], did you mean to send Prednisone for 5 tablets only on the prescription?

## 2022-05-27 NOTE — Telephone Encounter (Signed)
Called the pharmacy and he has already picked it up. He can take the 2 a day and then when he follows up with Dr. Darnell Level he can continue it if neccesary.  FYI to pcp  When I sent in the prednisone script I meant five days but sent 5 tablets. He will be seeing you on Friday. If you see that it is working will you continue it if needed please

## 2022-05-28 ENCOUNTER — Telehealth: Payer: Self-pay | Admitting: Family Medicine

## 2022-05-28 NOTE — Telephone Encounter (Signed)
Pt called and said he does have an appt for tomorrow but wanted to know if either he could get a prescription or if you could recommend something over the counter. He said that he said when he bends over or if he gets up to fast he gets dizzy or light headed. He said this started before the medication he got put on yesterday. Call back is [phone]

## 2022-05-28 NOTE — Telephone Encounter (Signed)
Noted. Will do thanks.

## 2022-05-29 ENCOUNTER — Ambulatory Visit (INDEPENDENT_AMBULATORY_CARE_PROVIDER_SITE_OTHER): Payer: Medicare HMO | Admitting: Family Medicine

## 2022-05-29 ENCOUNTER — Encounter: Payer: Self-pay | Admitting: Family Medicine

## 2022-05-29 VITALS — BP 138/70 | HR 66 | Temp 98.1°F | Ht 69.0 in | Wt 151.0 lb

## 2022-05-29 DIAGNOSIS — J302 Other seasonal allergic rhinitis: Secondary | ICD-10-CM

## 2022-05-29 DIAGNOSIS — R030 Elevated blood-pressure reading, without diagnosis of hypertension: Secondary | ICD-10-CM | POA: Diagnosis not present

## 2022-05-29 DIAGNOSIS — R42 Dizziness and giddiness: Secondary | ICD-10-CM | POA: Insufficient documentation

## 2022-05-29 DIAGNOSIS — C8307 Small cell B-cell lymphoma, spleen: Secondary | ICD-10-CM | POA: Diagnosis not present

## 2022-05-29 DIAGNOSIS — M79671 Pain in right foot: Secondary | ICD-10-CM | POA: Diagnosis not present

## 2022-05-29 DIAGNOSIS — J309 Allergic rhinitis, unspecified: Secondary | ICD-10-CM | POA: Insufficient documentation

## 2022-05-29 MED ORDER — PREDNISONE 20 MG PO TABS: ORAL_TABLET | ORAL | 0 refills | Status: DC

## 2022-05-29 MED ORDER — OLOPATADINE HCL 0.2 % OP SOLN: 1.0000 [drp] | Freq: Every day | OPHTHALMIC | 0 refills | Status: AC

## 2022-05-29 NOTE — Patient Instructions (Addendum)
I think this was a gout flare - you are responding well to the prednisone.  Continue prednisone - take 2 tablets daily for a total of 4 days, then drop dose to 1 tablet daily for another 4 days.  Good to see you today Call us if not improving with this treatment.  Try exercises provided for possible inner ear issue causing vertigo.  Look below for diet to help prevent gout.    Low-Purine Eating Plan A low-purine eating plan involves making food choices to limit your purine intake. Purine is a kind of uric acid. Too much uric acid in your blood can cause certain conditions, such as gout and kidney stones. Eating a low-purine diet may help control these conditions. What are tips for following this plan? Shopping Avoid buying products that contain high-fructose corn syrup. Check for this on food labels. It is commonly found in many processed foods and soft drinks. Be sure to check for it in baked goods such as cookies, canned fruits, and cereals and cereal bars. Avoid buying veal, chicken breast with skin, lamb, and organ meats such as liver. These types of meats tend to have the highest purine content. Choose dairy products. These may lower uric acid levels. Avoid certain types of fish. Not all fish and seafood have high purine content. Examples with high purine content include anchovies, trout, tuna, sardines, and salmon. Avoid buying beverages that contain alcohol, particularly beer and hard liquor. Alcohol can affect the way your body gets rid of uric acid. Meal planning  Learn which foods do or do not affect you. If you find out that a food tends to cause your gout symptoms to flare up, avoid eating that food. You can enjoy foods that do not cause problems. If you have any questions about a food item, talk with your dietitian or health care provider. Reduce the overall amount of meat in your diet. When you do eat meat, choose ones with lower purine content. Include plenty of fruits and  vegetables. Although some vegetables may have a high purine content--such as asparagus, mushrooms, spinach, or cauliflower--it has been shown that these do not contribute to uric acid blood levels as much. Consume at least 1 dairy serving a day. This has been shown to decrease uric acid levels. General information If you drink alcohol: Limit how much you have to: 0-1 drink a day for women who are not pregnant. 0-2 drinks a day for men. Know how much alcohol is in a drink. In the [place] one drink equals one 12 oz bottle of beer (355 mL), one 5 oz glass of wine (148 mL), or one 1 oz glass of hard liquor (44 mL). Drink plenty of water. Try to drink enough to keep your urine pale yellow. Fluids can help remove uric acid from your body. Work with your health care provider and dietitian to develop a plan to achieve or maintain a healthy weight. Losing weight may help reduce uric acid in your blood. What foods are recommended? The following are some types of foods that are good choices when limiting purine intake: Fresh or frozen fruits and vegetables. Whole grains, breads, cereals, and pasta. Rice. Beans, peas, legumes. Nuts and seeds. Dairy products. Fats and oils. The items listed above may not be a complete list. Talk with a dietitian about what dietary choices are best for you. What foods are not recommended? Limit your intake of foods high in purines, including: Beer and other alcohol. Meat-based gravy or sauce.  Canned or fresh fish, such as: Anchovies, sardines, herring, salmon, and tuna. Mussels and scallops. Codfish, trout, and haddock. Bacon, veal, chicken breast with skin, and lamb. Organ meats, such as: Liver or kidney. Tripe. Sweetbreads (thymus gland or pancreas). Wild Clinical biochemist. Yeast or yeast extract supplements. Drinks sweetened with high-fructose corn syrup, such as soda. Processed foods made with high-fructose corn syrup. The items listed above may not be a  complete list of foods and beverages you should limit. Contact a dietitian for more information. Summary Eating a low-purine diet may help control conditions caused by too much uric acid in the body, such as gout or kidney stones. Choose low-purine foods, limit alcohol, and limit high-fructose corn syrup. You will learn over time which foods do or do not affect you. If you find out that a food tends to cause your gout symptoms to flare up, avoid eating that food. This information is not intended to replace advice given to you by your health care provider. Make sure you discuss any questions you have with your health care provider. Document Revised: 11/27/2021 Document Reviewed: 11/27/2021 Elsevier Patient Education  [employer].

## 2022-05-29 NOTE — Telephone Encounter (Signed)
Let's do orthostatic vital signs when he comes in.  I recommend he increase water intake by 1-2 glasses/day to start as well.

## 2022-05-29 NOTE — Assessment & Plan Note (Addendum)
Significant response after 2 days of prednisone '40mg'$ . Not consistent with DVT or cellulitis. Story/exam more suspicious for gout/podagra although urate levels weren't very high. rec finish 8d prednisone taper, refilled steroid accordingly.  He will touch base with onc about allopurinol recommencement. Discussed would want to ensure full resolution of gout flare before restarting allopurinol.  Update if not improving as expected with treatment.  Handout provided on low purine diet.

## 2022-05-29 NOTE — Assessment & Plan Note (Signed)
Appreciate oncology care, upcoming appt next week.

## 2022-05-29 NOTE — Assessment & Plan Note (Signed)
Nasacort has helped nasal congestion symptoms, but notes ongoing allergic conjunctivitis symptoms of itchy watery eyes - will trial pataday eye drops. No known h/o glaucoma.

## 2022-05-29 NOTE — Assessment & Plan Note (Addendum)
BP normalizes on recheck. He continues carvedilol 6.'25mg'$  bid

## 2022-05-29 NOTE — Telephone Encounter (Signed)
Patient seen in the office today and orthostatics done.

## 2022-05-29 NOTE — Progress Notes (Signed)
Patient ID: Keith Jordan, male    DOB: Aug 12, 1941, 81 y.o.   MRN: [id number]  This visit was conducted in person.  BP 138/70 (BP Location: Right Arm, Cuff Size: Normal)   Pulse 66   Temp 98.1 F (36.7 C) (Temporal)   Ht '5\' 9"'$  (1.753 m)   Wt 151 lb (68.5 kg)   SpO2 95%   BMI 22.30 kg/m   Orthostatic VS for the past 24 hrs (Last 3 readings):  BP- Lying BP- Standing at 3 minutes  05/29/22 1219 -- 164/82  05/29/22 1215 154/74 --    CC: R foot swelling Subjective:   HPI: Keith Jordan is a 81 y.o. male presenting on 05/29/2022 for Foot Swelling (Here for R foot swelling f/u. Reports swelling has improved. )   See prior note for details. Dropped can of food on toe. 3 days later developed swelling and worsening pain that affected ambulation.  Saw Keith Garret NP earlier in the week, xrays negative for fracture.  Treated for presumed gout flare with prednisone course.  No prior history of gout.  Notes improvement in the past 2 days. Received prednisone '20mg'$  2 tab daily #5 on recent fill.    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 Was recommended allopurinol due to persistent lymphocytosis however not taking (ran out). WBC baseline 130-140k.   1d h/o dizziness when he got up - this started prior to prednisone commencement. Describes both lightheadedness and vertigo. This happened a few years ago, treated for inner ear difficulty.      Relevant past medical, surgical, family and social history reviewed and updated as indicated. Interim medical history since our last visit reviewed. Allergies and medications reviewed and updated. Outpatient Medications Prior to Visit  Medication Sig Dispense Refill   acetaminophen (TYLENOL) 500 MG tablet Take 500 mg by mouth every 6 (six) hours as needed for mild pain.     apixaban (ELIQUIS) 5 MG TABS tablet TAKE 1 TABLET BY MOUTH TWICE A DAY 180 tablet 1   Ascorbic Acid  (VITAMIN C) 1000 MG tablet Take 500 mg by mouth 2 (two) times daily.     atorvastatin (LIPITOR) 10 MG tablet Take 1 tablet (10 mg total) by mouth daily. 90 tablet 3   carboxymethylcellulose (REFRESH PLUS) 0.5 % SOLN Place 1 drop into both eyes 2 (two) times daily as needed (dry eyes).     carvedilol (COREG) 6.25 MG tablet Take 1 tablet (6.25 mg total) by mouth 2 (two) times daily. 180 tablet 3   Multiple Vitamins-Minerals (MULTIVITAMIN WITH MINERALS) tablet Take 1 tablet by mouth daily. Unknown strenght     Multiple Vitamins-Minerals (PRESERVISION AREDS) CAPS Take 1 capsule by mouth in the morning and at bedtime. (Patient taking differently: Take 1 capsule by mouth in the morning and at bedtime. Unknown strenght) 180 capsule 3   ondansetron (ZOFRAN) 8 MG tablet Take 1 tablet (8 mg total) by mouth every 8 (eight) hours as needed for nausea or vomiting. 30 tablet 0   sodium chloride (OCEAN) 0.65 % SOLN nasal spray Place 1 spray into both nostrils as needed for congestion.     triamcinolone (NASACORT) 55 MCG/ACT AERO nasal inhaler Place 1 spray into the nose daily as needed (sinus congestion). 1 each    predniSONE (DELTASONE) 20 MG tablet Take 2 tablets (40 mg total) by mouth daily with breakfast. 5 tablet 0   No facility-administered medications prior to  visit.     Per HPI unless specifically indicated in ROS section below Review of Systems  Objective:  BP 138/70 (BP Location: Right Arm, Cuff Size: Normal)   Pulse 66   Temp 98.1 F (36.7 C) (Temporal)   Ht '5\' 9"'$  (1.753 m)   Wt 151 lb (68.5 kg)   SpO2 95%   BMI 22.30 kg/m   Wt Readings from Last 3 Encounters:  05/29/22 151 lb (68.5 kg)  05/26/22 152 lb 6 oz (69.1 kg)  04/13/22 150 lb 4 oz (68.2 kg)      Physical Exam Vitals and nursing note reviewed.  Constitutional:      Appearance: Normal appearance. He is not ill-appearing.  HENT:     Head: Normocephalic and atraumatic.     Mouth/Throat:     Mouth: Mucous membranes are moist.      Pharynx: Oropharynx is clear. No oropharyngeal exudate or posterior oropharyngeal erythema.  Eyes:     Extraocular Movements: Extraocular movements intact.     Pupils: Pupils are equal, round, and reactive to light.  Neck:     Vascular: No carotid bruit.  Cardiovascular:     Rate and Rhythm: Normal rate and regular rhythm.     Pulses: Normal pulses.     Heart sounds: Normal heart sounds. No murmur heard. Pulmonary:     Effort: Pulmonary effort is normal. No respiratory distress.     Breath sounds: Normal breath sounds. No wheezing, rhonchi or rales.  Musculoskeletal:        General: Swelling and tenderness present.     Cervical back: Normal range of motion and neck supple.     Right lower leg: Edema present.     Left lower leg: No edema.     Comments:  2+ DP bilaterally Marked swelling throughout right foot with mild discomfort to palpation at 1st MTPJ. No pain with axial loading of great toe, no pain or laxity with ankle ligament testing.   Skin:    General: Skin is warm and dry.  Neurological:     Mental Status: He is alert.  Psychiatric:        Mood and Affect: Mood normal.        Behavior: Behavior normal.      Results for orders placed or performed in visit on 05/26/22  CBC  Result Value Ref Range   WBC 140.0 Repeated and verified X2. (HH) 4.0 - 10.5 K/uL   RBC 4.04 (L) 4.22 - 5.81 Mil/uL   Platelets 115.0 (L) 150.0 - 400.0 K/uL   Hemoglobin 12.3 (L) 13.0 - 17.0 g/dL   HCT 40.3 39.0 - 52.0 %   MCV 99.8 78.0 - 100.0 fl   MCHC 30.4 30.0 - 36.0 g/dL   RDW 15.0 11.5 - 15.5 %  Uric acid  Result Value Ref Range   Uric Acid, Serum 4.8 4.0 - 7.8 mg/dL  Comprehensive metabolic panel  Result Value Ref Range   Sodium 142 135 - 145 mEq/L   Potassium 3.8 3.5 - 5.1 mEq/L   Chloride 103 96 - 112 mEq/L   CO2 32 19 - 32 mEq/L   Glucose, Bld 161 (H) 70 - 99 mg/dL   BUN 21 6 - 23 mg/dL   Creatinine, Ser 1.09 0.40 - 1.50 mg/dL   Total Bilirubin 0.8 0.2 - 1.2 mg/dL    Alkaline Phosphatase 81 39 - 117 U/L   AST 23 0 - 37 U/L   ALT 10 0 - 53 U/L  Total Protein 6.4 6.0 - 8.3 g/dL   Albumin 4.0 3.5 - 5.2 g/dL   GFR 64.24 >60.00 mL/min   Calcium 9.1 8.4 - 10.5 mg/dL   DG Foot Complete Right CLINICAL DATA:  RIGHT foot pain, mid metatarsophalangeal pain of the first digit.  EXAM: RIGHT FOOT COMPLETE - 3+ VIEW  COMPARISON:  None available  FINDINGS: Osteopenia. Degenerative changes about the foot including the midfoot.  No visible fracture or sign of dislocation. No radiopaque foreign body. Substantial soft tissue swelling over the dorsum of the forefoot. Moderate soft tissue swelling over the midfoot.  IMPRESSION: 1. Substantial soft tissue swelling over the dorsum of the forefoot and moderate soft tissue swelling over the midfoot. No acute osseous abnormality. Correlate with any signs of cellulitis or infection. 2. Degenerative changes about the midfoot. If there is pain over the midfoot and metatarsophalangeal area could consider weighted views if there is clinical concern for midfoot injury though more the swelling appears to involve the forefoot rather than the midfoot.  Electronically Signed   By: Zetta Bills M.D.   On: 05/26/2022 10:42   Assessment & Plan:   Problem List Items Addressed This Visit     Splenic marginal zone b-cell lymphoma [facility])    Appreciate oncology care, upcoming appt next week.         Relevant Medications   predniSONE (DELTASONE) 20 MG tablet   Elevated blood pressure reading    BP normalizes on recheck. He continues carvedilol 6.'25mg'$  bid        Right foot pain - Primary    Significant response after 2 days of prednisone '40mg'$ . Not consistent with DVT or cellulitis. Story/exam more suspicious for gout/podagra although urate levels weren't very high. rec finish 8d prednisone taper, refilled steroid accordingly.  He will touch base with onc about allopurinol recommencement. Discussed would want to  ensure full resolution of gout flare before restarting allopurinol.  Update if not improving as expected with treatment.  Handout provided on low purine diet.        Allergic rhinitis    Nasacort has helped nasal congestion symptoms, but notes ongoing allergic conjunctivitis symptoms of itchy watery eyes - will trial pataday eye drops. No known h/o glaucoma.        Dizziness    Anticipate positional vertigo in h/o same.  Discussed okay to use Antivert or meclizine over-the-counter, however recommend avoid if able due to possible side effects of medication. Provided with modified Epley canalith repositioning maneuvers to try at home.         Meds ordered this encounter  Medications   DISCONTD: predniSONE (DELTASONE) 20 MG tablet    Sig: Take two tablets daily for total of 5 days followed by one tablet daily for 5 days (to complete previous prednisone course)    Dispense:  10 tablet    Refill:  0    (To complete previous prednisone course)   predniSONE (DELTASONE) 20 MG tablet    Sig: Take two tablets daily for total of 4 days followed by one tablet daily for 4 days (to complete previous prednisone course)    Dispense:  7 tablet    Refill:  0    (To complete previous prednisone course) - use #7 not #10   Olopatadine HCl 0.2 % SOLN    Sig: Apply 1 drop to eye daily. (Both)    Dispense:  2.5 mL    Refill:  0   No orders of the defined types  were placed in this encounter.   Patient instructions: I think this was a gout flare - you are responding well to the prednisone.  Continue prednisone - take 2 tablets daily for a total of 4 days, then drop dose to 1 tablet daily for another 4 days.  Good to see you today Call us if not improving with this treatment.  Try exercises provided for possible inner ear issue causing vertigo.  Look below for diet to help prevent gout.   Follow up plan: Return if symptoms worsen or fail to improve.  [name], MD

## 2022-05-29 NOTE — Assessment & Plan Note (Signed)
Anticipate positional vertigo in h/o same.  Discussed okay to use Antivert or meclizine over-the-counter, however recommend avoid if able due to possible side effects of medication. Provided with modified Epley canalith repositioning maneuvers to try at home.

## 2022-06-03 DIAGNOSIS — H353132 Nonexudative age-related macular degeneration, bilateral, intermediate dry stage: Secondary | ICD-10-CM | POA: Diagnosis not present

## 2022-06-04 ENCOUNTER — Other Ambulatory Visit: Payer: Self-pay

## 2022-06-04 ENCOUNTER — Inpatient Hospital Stay: Payer: Medicare HMO | Admitting: Hematology and Oncology

## 2022-06-04 ENCOUNTER — Other Ambulatory Visit: Payer: Self-pay | Admitting: Hematology and Oncology

## 2022-06-04 ENCOUNTER — Inpatient Hospital Stay: Payer: Medicare HMO | Attending: Hematology and Oncology

## 2022-06-04 VITALS — BP 140/65 | HR 61 | Temp 97.9°F | Resp 16 | Ht 69.0 in | Wt 148.9 lb

## 2022-06-04 DIAGNOSIS — C8307 Small cell B-cell lymphoma, spleen: Secondary | ICD-10-CM

## 2022-06-04 DIAGNOSIS — D696 Thrombocytopenia, unspecified: Secondary | ICD-10-CM | POA: Diagnosis not present

## 2022-06-04 DIAGNOSIS — Z7952 Long term (current) use of systemic steroids: Secondary | ICD-10-CM | POA: Insufficient documentation

## 2022-06-04 DIAGNOSIS — R161 Splenomegaly, not elsewhere classified: Secondary | ICD-10-CM | POA: Insufficient documentation

## 2022-06-04 DIAGNOSIS — I4892 Unspecified atrial flutter: Secondary | ICD-10-CM | POA: Diagnosis not present

## 2022-06-04 DIAGNOSIS — D72820 Lymphocytosis (symptomatic): Secondary | ICD-10-CM | POA: Diagnosis not present

## 2022-06-04 DIAGNOSIS — R110 Nausea: Secondary | ICD-10-CM | POA: Insufficient documentation

## 2022-06-04 LAB — CMP (CANCER CENTER ONLY)
ALT: 12 U/L (ref 0–44)
AST: 19 U/L (ref 15–41)
Albumin: 3.8 g/dL (ref 3.5–5.0)
Alkaline Phosphatase: 71 U/L (ref 38–126)
Anion gap: 4 — ABNORMAL LOW (ref 5–15)
BUN: 30 mg/dL — ABNORMAL HIGH (ref 8–23)
CO2: 33 mmol/L — ABNORMAL HIGH (ref 22–32)
Calcium: 9.3 mg/dL (ref 8.9–10.3)
Chloride: 101 mmol/L (ref 98–111)
Creatinine: 1.16 mg/dL (ref 0.61–1.24)
GFR, Estimated: >60 mL/min (ref >60)
Glucose, Bld: 260 mg/dL — ABNORMAL HIGH (ref 70–99)
Potassium: 4.3 mmol/L (ref 3.5–5.1)
Sodium: 138 mmol/L (ref 135–145)
Total Bilirubin: 0.5 mg/dL (ref 0.3–1.2)
Total Protein: 6.3 g/dL — ABNORMAL LOW (ref 6.5–8.1)

## 2022-06-04 LAB — CBC WITH DIFFERENTIAL (CANCER CENTER ONLY)
Abs Immature Granulocytes: 0.87 10*3/uL — ABNORMAL HIGH (ref 0.00–0.07)
Basophils Absolute: 0 10*3/uL (ref 0.0–0.1)
Basophils Relative: 0 %
Eosinophils Absolute: 0.2 10*3/uL (ref 0.0–0.5)
Eosinophils Relative: 0 %
HCT: 38.6 % — ABNORMAL LOW (ref 39.0–52.0)
Hemoglobin: 12.5 g/dL — ABNORMAL LOW (ref 13.0–17.0)
Immature Granulocytes: 1 %
Lymphocytes Relative: 92 %
Lymphs Abs: 134 10*3/uL — ABNORMAL HIGH (ref 0.7–4.0)
MCH: 31.3 pg (ref 26.0–34.0)
MCHC: 32.4 g/dL (ref 30.0–36.0)
MCV: 96.5 fL (ref 80.0–100.0)
Monocytes Absolute: 4.5 10*3/uL — ABNORMAL HIGH (ref 0.1–1.0)
Monocytes Relative: 3 %
Neutro Abs: 5.3 10*3/uL (ref 1.7–7.7)
Neutrophils Relative %: 4 %
Platelet Count: 156 10*3/uL (ref 150–400)
RBC: 4 MIL/uL — ABNORMAL LOW (ref 4.22–5.81)
RDW: 14.2 % (ref 11.5–15.5)
Smear Review: NORMAL
WBC Count: 144.9 10*3/uL (ref 4.0–10.5)
nRBC: 0 % (ref 0.0–0.2)

## 2022-06-04 LAB — LACTATE DEHYDROGENASE: LDH: 154 U/L (ref 98–192)

## 2022-06-04 NOTE — Progress Notes (Signed)
[facility] [phone]   [phone]  PROGRESS NOTE  Patient Care Team: Eustaquio Boyden, MD as PCP - General (Family Medicine) Thomasene Ripple, DO as PCP - Cardiology (Cardiology) Specialists, [name] as Consulting Physician (Orthopedic Surgery) Phil Dopp, [facility] as Pharmacist (Pharmacist)  Hematological/Oncological History # Splenic Marginal Zone Lymphoma 01/30/2021: WBC 186.3, Hgb 13.5, MCV 96.3, Plt 132. Referred to Emergency Department. 02/05/2021: establish care with Dr. Leonides Schanz  02/24/2021: Week 1 of Monotherapy Rituximab 03/03/2021:  Week 2 of Monotherapy Rituximab 03/10/2021:  Week 3 of Monotherapy Rituximab. WBC 109.8 03/17/2021: Week 4 of Monotherapy Rituximab. WBC 94.8 03/24/2021: WBC 59.8 05/01/2021: WBC 89.1, Hgb 13.5, Plt 136 09/03/2021: WBC 97.8, Hgb 12.9, Plt 141 12/03/2021: WBC 137.8, Hgb 13.6, MCV 96.1, Plt 130 03/04/2022: WBC 131.1, Hgb 12.0, MCV 97.7, Plt 121  Interval History:  Keith Jordan 81 y.o. male with medical history significant for splenic marginal zone lymphoma who presents for a follow up visit. The patient's last visit was on 03/04/2022. In the interim since the last visit he has been in stable health.   On exam today Keith Jordan reports he has been well overall interim since her last visit.  He reports his energy is currently 7 or 8 out of 10.  He notes he is able to do yard work this year but was not able to it last year.  He reports he is not having any episodes of bleeding or bruising.  He reports he did drop a can of soup on his big toe and that his foot swelled up with this.  He did see his primary care provider who thought that he may be having a gout flare and started him up on prednisone.  He notes that his appetite has "slowed down" on prednisone.  Fortunately the swelling in his foot has decreased.  He notes no abdominal fullness or early satiety.  He denies any fevers, chills, sweats, nausea, vomiting or  diarrhea.  A full 10 point ROS is listed below.  MEDICAL HISTORY:  Past Medical History:  Diagnosis Date   Abnormal breath sounds 10/11/2020   Acute sinusitis 01/29/2021   Advanced care planning/counseling discussion 09/01/2016   BPH (benign prostatic hyperplasia) 09/29/2018   Ear fullness, bilateral 04/03/2021   Health maintenance examination 09/01/2016   Hyperlipidemia    Irregular heart beat 03/24/2021   Ischemic optic neuropathy 2006   [place] [facility]   Leukocytosis 01/30/2021   Severe s/p ER eval 01/2021   Medicare annual wellness visit, subsequent 10/04/2019   Nausea without vomiting 03/24/2021   Personal history of colonic adenoma 07/30/2008   Prediabetes 07/05/2016   A1c 6.5% 03/2014    Prediabetes 07/05/2016   A1c 6.5% 03/2014    Primary osteoarthritis of knee    bilateral s/p L knee replacement, receives R knee injections [name], [name])   Sebaceous cyst 01/30/2020   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    SURGICAL HISTORY: Past Surgical History:  Procedure Laterality Date   A-FLUTTER ABLATION N/A 07/11/2021   Procedure: A-FLUTTER ABLATION;  Surgeon: [name], MD;  Location: [facility] INVASIVE CV LAB;  Service: Cardiovascular;  Laterality: N/A;   COLONOSCOPY  12/2013   no polyps, no rpt due [name])   CYST EXCISION  01/2020   TOTAL KNEE ARTHROPLASTY Left 2005   [name]    SOCIAL HISTORY: Social History   Socioeconomic History   Marital status: Married    Spouse name: Not on file  Number of children: Not on file   Years of education: Not on file   Highest education level: Not on file  Occupational History   Not on file  Tobacco Use   Smoking status: Never   Smokeless tobacco: Never  Vaping Use   Vaping Use: Never used  Substance and Sexual Activity   Alcohol use: Yes    Comment: occasional   Drug use: No   Sexual activity: Not Currently  Other Topics Concern   Not on file  Social History Narrative   Lives with wife, 2 cats   Occ: retired  Personal assistant   Activity: works in yard   Diet: some water, fruits/vegetables daily   Social Determinants of Health   Financial Resource Strain: [place]  (09/08/2021)   Overall Financial Resource Strain (CARDIA)    Difficulty of Paying Living Expenses: Not very hard  Food Insecurity: Not on file  Transportation Needs: Not on file  Physical Activity: Inactive (10/04/2020)   Exercise Vital Sign    Days of Exercise per Week: 0 days    Minutes of Exercise per Session: 0 min  Stress: Not on file  Social Connections: Not on file  Intimate Partner Violence: Not on file    FAMILY HISTORY: Family History  Problem Relation Age of Onset   Blindness Mother 64       ?stroke in eyes   Lung disease Father        coal miner   Arthritis Father    Colon cancer Neg Hx    Pancreatic cancer Neg Hx    Stomach cancer Neg Hx    Esophageal cancer Neg Hx    Rectal cancer Neg Hx    Cancer Neg Hx    Diabetes Neg Hx    CAD Neg Hx    Stroke Neg Hx     ALLERGIES:  has No Known Allergies.  MEDICATIONS:  Current Outpatient Medications  Medication Sig Dispense Refill   acetaminophen (TYLENOL) 500 MG tablet Take 500 mg by mouth every 6 (six) hours as needed for mild pain.     apixaban (ELIQUIS) 5 MG TABS tablet TAKE 1 TABLET BY MOUTH TWICE A DAY 180 tablet 1   Ascorbic Acid (VITAMIN C) 1000 MG tablet Take 500 mg by mouth 2 (two) times daily.     atorvastatin (LIPITOR) 10 MG tablet Take 1 tablet (10 mg total) by mouth daily. 90 tablet 3   carboxymethylcellulose (REFRESH PLUS) 0.5 % SOLN Place 1 drop into both eyes 2 (two) times daily as needed (dry eyes).     carvedilol (COREG) 6.25 MG tablet Take 1 tablet (6.25 mg total) by mouth 2 (two) times daily. 180 tablet 3   Multiple Vitamins-Minerals (MULTIVITAMIN WITH MINERALS) tablet Take 1 tablet by mouth daily. Unknown strenght     Multiple Vitamins-Minerals (PRESERVISION AREDS) CAPS Take 1 capsule by mouth in the morning and at bedtime. (Patient taking  differently: Take 1 capsule by mouth in the morning and at bedtime. Unknown strenght) 180 capsule 3   Olopatadine HCl 0.2 % SOLN Apply 1 drop to eye daily. (Both) 2.5 mL 0   ondansetron (ZOFRAN) 8 MG tablet Take 1 tablet (8 mg total) by mouth every 8 (eight) hours as needed for nausea or vomiting. 30 tablet 0   predniSONE (DELTASONE) 20 MG tablet Take two tablets daily for total of 4 days followed by one tablet daily for 4 days (to complete previous prednisone course) 7 tablet 0   sodium chloride (OCEAN)  0.65 % SOLN nasal spray Place 1 spray into both nostrils as needed for congestion.     triamcinolone (NASACORT) 55 MCG/ACT AERO nasal inhaler Place 1 spray into the nose daily as needed (sinus congestion). 1 each    No current facility-administered medications for this visit.    REVIEW OF SYSTEMS:   Constitutional: ( - ) fevers, ( - )  chills , ( - ) night sweats Eyes: ( - ) blurriness of vision, ( - ) double vision, ( - ) watery eyes Ears, nose, mouth, throat, and face: ( - ) mucositis, ( - ) sore throat Respiratory: ( - ) cough, ( - ) dyspnea, ( - ) wheezes Cardiovascular: ( - ) palpitation, ( - ) chest discomfort, ( - ) lower extremity swelling Gastrointestinal:  ( - ) nausea, ( - ) heartburn, ( - ) change in bowel habits Skin: ( - ) abnormal skin rashes Lymphatics: ( - ) new lymphadenopathy, ( - ) easy bruising Neurological: ( - ) numbness, ( -) tingling, ( - ) new weaknesses Behavioral/Psych: ( - ) mood change, ( - ) new changes  All other systems were reviewed with the patient and are negative.  PHYSICAL EXAMINATION: ECOG PERFORMANCE STATUS: 1 - Symptomatic but completely ambulatory  Vitals:   06/04/22 1050  BP: 140/65  Pulse: 61  Resp: 16  Temp: 97.9 F (36.6 C)  SpO2: 96%    Filed Weights   06/04/22 1050  Weight: 148 lb 14.4 oz (67.5 kg)    GENERAL: well appearing elderly Caucasian male alert, no distress and comfortable SKIN: skin color, texture, turgor are normal,  no rashes or significant lesions EYES: conjunctiva are pink and non-injected, sclera clear LUNGS: clear to auscultation and percussion with normal breathing effort HEART: regular rate & rhythm and no murmurs and no lower extremity edema Musculoskeletal: no cyanosis of digits and no clubbing  PSYCH: alert & oriented x 3, fluent speech NEURO: no focal motor/sensory deficits  LABORATORY DATA:  I have reviewed the data as listed    Latest Ref Rng & Units 06/04/2022   10:30 AM 05/26/2022   10:41 AM 03/04/2022    2:39 PM  CBC  WBC 4.0 - 10.5 K/uL 144.9  140.0 Repeated and verified X2.  131.1   Hemoglobin 13.0 - 17.0 g/dL 12.5  12.3  12.0   Hematocrit 39.0 - 52.0 % 38.6  40.3  37.8   Platelets 150 - 400 K/uL 156  115.0  121        Latest Ref Rng & Units 06/04/2022   10:30 AM 05/26/2022   10:41 AM 03/04/2022    2:39 PM  CMP  Glucose 70 - 99 mg/dL 260  161  140   BUN 8 - 23 mg/dL $Remove'30  21  29   'TOtMNgi$ Creatinine 0.61 - 1.24 mg/dL 1.16  1.09  1.13   Sodium 135 - 145 mmol/L 138  142  138   Potassium 3.5 - 5.1 mmol/L 4.3  3.8  4.7   Chloride 98 - 111 mmol/L 101  103  101   CO2 22 - 32 mmol/L 33  32  32   Calcium 8.9 - 10.3 mg/dL 9.3  9.1  9.0   Total Protein 6.5 - 8.1 g/dL 6.3  6.4  6.9   Total Bilirubin 0.3 - 1.2 mg/dL 0.5  0.8  0.4   Alkaline Phos 38 - 126 U/L 71  81  67   AST 15 - 41 U/L 19  23  30   ALT 0 - 44 U/L 12  10  17     RADIOGRAPHIC STUDIES: DG Foot Complete Right  Result Date: 05/26/2022 CLINICAL DATA:  RIGHT foot pain, mid metatarsophalangeal pain of the first digit. EXAM: RIGHT FOOT COMPLETE - 3+ VIEW COMPARISON:  None available FINDINGS: Osteopenia. Degenerative changes about the foot including the midfoot. No visible fracture or sign of dislocation. No radiopaque foreign body. Substantial soft tissue swelling over the dorsum of the forefoot. Moderate soft tissue swelling over the midfoot. IMPRESSION: 1. Substantial soft tissue swelling over the dorsum of the forefoot and moderate soft  tissue swelling over the midfoot. No acute osseous abnormality. Correlate with any signs of cellulitis or infection. 2. Degenerative changes about the midfoot. If there is pain over the midfoot and metatarsophalangeal area could consider weighted views if there is clinical concern for midfoot injury though more the swelling appears to involve the forefoot rather than the midfoot. Electronically Signed   By: Zetta Bills M.D.   On: 05/26/2022 10:42     ASSESSMENT & PLAN Keith Jordan 81 y.o. male with medical history significant for splenic marginal zone lymphoma. CT CAP from 02/13/21  showed only splenic involvement and a bone marrow biopsy from 02/21/21 confirmed the diagnosis.  The patient has only leukocytosis and splenomegaly.  The recommendation is monotherapy rituximab 375 mg per metered squared q. 7 days x 4 doses. He completed this on 03/17/2021.   At this time is marginal zone lymphoma appears like an indolent lymphocyte predominant variant.  These tend to behave more like CLL.  Given that I would recommend holding on treatment unless the patient were to have difficulties with splenomegaly, anemia, or worsening thrombocytopenia.  For treatment choices one could consider treatment with Ibrutinib/Zanbrutinib vs Bendamustine + ritux/obinutuzumab. Would need caution with ibrutinib given patient's cardiac history and current anticoagulation therapy.    Patient is doing well without any significant limitations. Labs from today show lymphocytosis and modest stable thrombocytopenia. Plan for patient to return to the clinic in 12 weeks.  #Splenic Marginal Zone Lymphoma, stable --findings are consistent with a splenic marginal zone lymphoma. --patient completed bone marrow biopsy and a CT chest abdomen pelvis. Disease appear limited to peripheral blood and spleen.  --Treatment of choice with confirmed splenic marginal zone lymphoma is rituximab 375 mg/m q. 7 days x 4 doses. He completed this on  03/17/2021.  --Due to persistent lymphocytosis, recommend to continue with allopurinol to prevent TLS. --if there is concern for worsening disease with an indication for treatment can consider treatment with Ibrutinib/Zanbrutinib vs Bendamustine + ritux/obinutuzumab. Would need caution with ibrutinib given patient's cardiac history and current anticoagulation therapy.  --RTC in 3 months with labs   #Thrombocytopenia-improved --Plt today stable at 156, fluctuate.  --likely 2/2 to splenomegaly.   #Nausea --Take Zofran 8 mg q 8 hours PRN.   #Atrial Flutter --patient follows with Cardiology  No orders of the defined types were placed in this encounter.  All questions were answered. The patient knows to call the clinic with any problems, questions or concerns.  A total of more than 30 minutes were spent on this encounter and over half of that time was spent on counseling and coordination of care as outlined above.   Ledell Peoples, MD Department of Hematology/Oncology [place] at [facility] Phone: [phone] Pager: [phone] Email: [email or web address]

## 2022-06-04 NOTE — Progress Notes (Signed)
CRITICAL VALUE STICKER  CRITICAL VALUE:  WBC 144.9  RECEIVER (on-site recipient of call): Drucie Ip, RN  DATE & TIME NOTIFIED: 11am  06/04/22  MESSENGER (representative from lab): Lelan Pons  MD NOTIFIED:  Dr. Lorenso Courier  TIME OF NOTIFICATION:  11:02 am  RESPONSE:  acknowledged lab results

## 2022-06-08 ENCOUNTER — Ambulatory Visit (INDEPENDENT_AMBULATORY_CARE_PROVIDER_SITE_OTHER): Payer: Medicare HMO | Admitting: Cardiology

## 2022-06-08 ENCOUNTER — Encounter: Payer: Self-pay | Admitting: Cardiology

## 2022-06-08 VITALS — BP 126/64 | HR 67 | Ht 69.0 in | Wt 148.2 lb

## 2022-06-08 DIAGNOSIS — R7303 Prediabetes: Secondary | ICD-10-CM

## 2022-06-08 DIAGNOSIS — I483 Typical atrial flutter: Secondary | ICD-10-CM | POA: Diagnosis not present

## 2022-06-08 DIAGNOSIS — E782 Mixed hyperlipidemia: Secondary | ICD-10-CM

## 2022-06-08 MED ORDER — CARVEDILOL 3.125 MG PO TABS: 3.1250 mg | ORAL_TABLET | Freq: Two times a day (BID) | ORAL | 3 refills | Status: DC

## 2022-06-08 NOTE — Progress Notes (Signed)
Cardiology Office Note:    Date:  06/09/2022   ID:  Keith Jordan, DOB [birth year], MRN [id number]  PCP:  [name], MD  Cardiologist:  [name], DO  Electrophysiologist:  None   Referring MD: [name], MD   " I am doing well"  History of Present Illness:    Keith Jordan is a 81 y.o. 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   I saw the patient on 04/17/2021 and at that time he reported intermittent palpitations, therefore a zio live monitor was placed on the patient. Two days of wearing the monitor, atrial flutter was noted and we started the patient on anticoagulation with Eliquis ( CHADS2 VASc score of 2). He was also started on Cardizem 120 mg daily.   I last saw the patient on August 02, 2019.  At that time we continued his Cardizem.  Since I saw the patient he also had seen EP.  He had maintained sinus rhythm.  Eliquis was stopped.  I saw the patient on 02/03/2022 at that time he had not tolerated the metoprolol so I stopped the metoprolol and started low-dose carvedilol.  He also had not tolerated Cardizem.  Today he tells me he is experiencing some leg cramping when he is sitting.  He has tolerated the carvedilol and he is happy with that.  Past Medical History:  Diagnosis Date   Abnormal breath sounds 10/11/2020   Acute sinusitis 01/29/2021   Advanced care planning/counseling discussion 09/01/2016   BPH (benign prostatic hyperplasia) 09/29/2018   Ear fullness, bilateral 04/03/2021   Health maintenance examination 09/01/2016   Hyperlipidemia    Irregular heart beat 03/24/2021   Ischemic optic neuropathy 2006   [place] [name]   Leukocytosis 01/30/2021   Severe s/p ER eval 01/2021   Medicare annual wellness visit, subsequent 10/04/2019   Nausea without vomiting 03/24/2021   Personal history of colonic adenoma 07/30/2008   Prediabetes  07/05/2016   A1c 6.5% 03/2014    Prediabetes 07/05/2016   A1c 6.5% 03/2014    Primary osteoarthritis of knee    bilateral s/p L knee replacement, receives R knee injections [name], [name])   Sebaceous cyst 01/30/2020   Splenic marginal zone b-cell lymphoma ([name]) 02/05/2021    Past Surgical History:  Procedure Laterality Date   A-FLUTTER ABLATION N/A 07/11/2021   Procedure: A-FLUTTER ABLATION;  Surgeon: [name], MD;  Location: [name];  Service: Cardiovascular;  Laterality: N/A;   COLONOSCOPY  12/2013   no polyps, no rpt due [name])   CYST EXCISION  01/2020   TOTAL KNEE ARTHROPLASTY Left 2005   [name]    Current Medications: Current Meds  Medication Sig   acetaminophen (TYLENOL) 500 MG tablet Take 500 mg by mouth every 6 (six) hours as needed for mild pain.   apixaban (ELIQUIS) 5 MG TABS tablet TAKE 1 TABLET BY MOUTH TWICE A DAY   Ascorbic Acid (VITAMIN C) 1000 MG tablet Take 500 mg by mouth 2 (two) times daily.   atorvastatin (LIPITOR) 10 MG tablet Take 1 tablet (10 mg total) by mouth daily.   carboxymethylcellulose (REFRESH PLUS) 0.5 % SOLN Place 1 drop into both eyes 2 (two) times daily as needed (dry eyes).   carvedilol (COREG) 3.125 MG tablet Take 1 tablet (3.125 mg total) by mouth 2 (two) times daily.   Multiple Vitamins-Minerals (MULTIVITAMIN WITH MINERALS) tablet Take  1 tablet by mouth daily. Unknown strenght   Multiple Vitamins-Minerals (PRESERVISION AREDS) CAPS Take 1 capsule by mouth in the morning and at bedtime. (Patient taking differently: Take 1 capsule by mouth in the morning and at bedtime. Unknown strenght)   Olopatadine HCl 0.2 % SOLN Apply 1 drop to eye daily. (Both)   ondansetron (ZOFRAN) 8 MG tablet Take 1 tablet (8 mg total) by mouth every 8 (eight) hours as needed for nausea or vomiting.   predniSONE (DELTASONE) 20 MG tablet Take two tablets daily for total of 4 days followed by one tablet daily for 4 days (to complete previous prednisone  course)   sodium chloride (OCEAN) 0.65 % SOLN nasal spray Place 1 spray into both nostrils as needed for congestion.   triamcinolone (NASACORT) 55 MCG/ACT AERO nasal inhaler Place 1 spray into the nose daily as needed (sinus congestion).   [DISCONTINUED] carvedilol (COREG) 6.25 MG tablet Take 1 tablet (6.25 mg total) by mouth 2 (two) times daily.     Allergies:   Patient has no known allergies.   Social History   Socioeconomic History   Marital status: Married    Spouse name: Not on file   Number of children: Not on file   Years of education: Not on file   Highest education level: Not on file  Occupational History   Not on file  Tobacco Use   Smoking status: Never   Smokeless tobacco: Never  Vaping Use   Vaping Use: Never used  Substance and Sexual Activity   Alcohol use: Yes    Comment: occasional   Drug use: No   Sexual activity: Not Currently  Other Topics Concern   Not on file  Social History Narrative   Lives with wife, 2 cats   Occ: retired Personal assistant   Activity: works in yard   Diet: some water, fruits/vegetables daily   Social Determinants of Radio broadcast assistant Strain: [place]  (09/08/2021)   Overall Financial Resource Strain (CARDIA)    Difficulty of Paying Living Expenses: Not very hard  Food Insecurity: No Food Insecurity (10/04/2020)   Hunger Vital Sign    Worried About Running Out of Food in the Last Year: Never true    [place] in the Last Year: Never true  Transportation Needs: No Transportation Needs (10/04/2020)   PRAPARE - Hydrologist (Medical): No    Lack of Transportation (Non-Medical): No  Physical Activity: Inactive (10/04/2020)   Exercise Vital Sign    Days of Exercise per Week: 0 days    Minutes of Exercise per Session: 0 min  Stress: No Stress Concern Present (10/04/2020)   [place]    Feeling of Stress : Not at all  Social  Connections: Not on file     Family History: The patient's family history includes Arthritis in his father; Blindness (age of onset: 66) in his mother; Lung disease in his father. There is no history of Colon cancer, Pancreatic cancer, Stomach cancer, Esophageal cancer, Rectal cancer, Cancer, Diabetes, CAD, or Stroke.  ROS:   Review of Systems  Constitution: Negative for decreased appetite, fever and weight gain.  HENT: Negative for congestion, ear discharge, hoarse voice and sore throat.   Eyes: Negative for discharge, redness, vision loss in right eye and visual halos.  Cardiovascular: Negative for chest pain, dyspnea on exertion, leg swelling, orthopnea and palpitations.  Respiratory: Negative for cough, hemoptysis, shortness  of breath and snoring.   Endocrine: Negative for heat intolerance and polyphagia.  Hematologic/Lymphatic: Negative for bleeding problem. Does not bruise/bleed easily.  Skin: Negative for flushing, nail changes, rash and suspicious lesions.  Musculoskeletal: Negative for arthritis, joint pain, muscle cramps, myalgias, neck pain and stiffness.  Gastrointestinal: Negative for abdominal pain, bowel incontinence, diarrhea and excessive appetite.  Genitourinary: Negative for decreased libido, genital sores and incomplete emptying.  Neurological: Negative for brief paralysis, focal weakness, headaches and loss of balance.  Psychiatric/Behavioral: Negative for altered mental status, depression and suicidal ideas.  Allergic/Immunologic: Negative for HIV exposure and persistent infections.    EKGs/Labs/Other Studies Reviewed:    The following studies were reviewed today:   EKG: None today  TTE 2022 IMPRESSIONS  1. Left ventricular ejection fraction, by estimation, is 70 to 75%. The  left ventricle has hyperdynamic function. The left ventricle has no  regional wall motion abnormalities. Left ventricular diastolic parameters  are consistent with Grade I diastolic   dysfunction (impaired relaxation).   2. Right ventricular systolic function is normal. The right ventricular  size is mildly enlarged. There is mildly elevated pulmonary artery  systolic pressure. The estimated right ventricular systolic pressure is  03.2 mmHg.   3. The mitral valve is normal in structure. No evidence of mitral valve  regurgitation. No evidence of mitral stenosis.   4. The aortic valve is normal in structure. Aortic valve regurgitation is  not visualized. No aortic stenosis is present.   5. The inferior vena cava is normal in size with greater than 50%  respiratory variability, suggesting right atrial pressure of 3 mmHg.   FINDINGS   Left Ventricle: Left ventricular ejection fraction, by estimation, is 70  to 75%. The left ventricle has hyperdynamic function. The left ventricle  has no regional wall motion abnormalities. The left ventricular internal  cavity size was normal in size.  There is no left ventricular hypertrophy. Left ventricular diastolic  parameters are consistent with Grade I diastolic dysfunction (impaired  relaxation).   Right Ventricle: The right ventricular size is mildly enlarged. No  increase in right ventricular wall thickness. Right ventricular systolic  function is normal. There is mildly elevated pulmonary artery systolic  pressure. The tricuspid regurgitant  velocity is 2.89 m/s, and with an assumed right atrial pressure of 3 mmHg,  the estimated right ventricular systolic pressure is 12.2 mmHg.   Left Atrium: Left atrial size was normal in size.   Right Atrium: Right atrial size was normal in size.   Pericardium: There is no evidence of pericardial effusion.   Mitral Valve: The mitral valve is normal in structure. No evidence of  mitral valve regurgitation. No evidence of mitral valve stenosis.   Tricuspid Valve: The tricuspid valve is normal in structure. Tricuspid  valve regurgitation is mild . No evidence of tricuspid stenosis.    Aortic Valve: The aortic valve is normal in structure. Aortic valve  regurgitation is not visualized. No aortic stenosis is present.   Pulmonic Valve: The pulmonic valve was normal in structure. Pulmonic valve  regurgitation is not visualized. No evidence of pulmonic stenosis.   Aorta: The aortic root is normal in size and structure.   Venous: The inferior vena cava is normal in size with greater than 50%  respiratory variability, suggesting right atrial pressure of 3 mmHg.   IAS/Shunts: No atrial level shunt detected by color flow Doppler.        Zio monitor The patient wore the monitor for 13 days,  21 hours starting April 17, 2021.   Indication: Palpitations   The minimum heart rate was 46 bpm, maximum heart rate was 149 bpm, and average heart rate was 68 bpm. Predominant underlying rhythm was Sinus Rhythm.   3 Supraventricular Tachycardia runs occurred, the run with the fastest interval lasting 4 beats with a max rate of 126 bpm, the longest lasting 7 beats with an avg rate of 103 bpm.     Atrial Flutter occurred (3% burden), ranging from 46-149 bpm (avg of 82 bpm), the longest lasting 1 hour 41 mins with an avg rate of 92 bpm.   Premature atrial complexes were occasional (1.0%, 13493). Premature Ventricular complexes were rare less than 1%. No ventricular tachycardia, no pauses present.   Symptoms are associated with atrial flutter, sinus rhythm and premature atrial complexes.       Conclusion: This study is remarkable for the following:                       1.  Symptomatic atrial flutter                       2.  Occasional premature atrial complexes.    Recent Labs: 02/03/2022: Magnesium 2.4 06/04/2022: ALT 12; BUN 30; Creatinine 1.16; Hemoglobin 12.5; Platelet Count 156; Potassium 4.3; Sodium 138  Recent Lipid Panel    Component Value Date/Time   CHOL 150 10/13/2021 1314   CHOL 140 04/16/2015 0000   CHOL 140 04/16/2015 0000   TRIG 168.0 (H) 10/13/2021 1314    TRIG 94 04/16/2015 0000   TRIG 94 04/16/2015 0000   HDL 35.30 (L) 10/13/2021 1314   CHOLHDL 4 10/13/2021 1314   VLDL 33.6 10/13/2021 1314   LDLCALC 81 10/13/2021 1314   LDLCALC 86 04/16/2015 0000   LDLCALC 86 04/16/2015 0000    Physical Exam:    VS:  BP 126/64   Pulse 67   Ht '5\' 9"'$  (1.753 m)   Wt 148 lb 3.2 oz (67.2 kg)   SpO2 96%   BMI 21.89 kg/m     Wt Readings from Last 3 Encounters:  06/08/22 148 lb 3.2 oz (67.2 kg)  06/04/22 148 lb 14.4 oz (67.5 kg)  05/29/22 151 lb (68.5 kg)     GEN: Well nourished, well developed in no acute distress HEENT: Normal NECK: No JVD; No carotid bruits LYMPHATICS: No lymphadenopathy CARDIAC: S1S2 noted,RRR, no murmurs, rubs, gallops RESPIRATORY:  Clear to auscultation without rales, wheezing or rhonchi  ABDOMEN: Soft, non-tender, non-distended, +bowel sounds, no guarding. EXTREMITIES: No edema, No cyanosis, no clubbing MUSCULOSKELETAL:  No deformity  SKIN: Warm and dry NEUROLOGIC:  Alert and oriented x 3, non-focal PSYCHIATRIC:  Normal affect, good insight  ASSESSMENT:    1. Typical atrial flutter ([place])   2. Prediabetes   3. Mixed hyperlipidemia    PLAN:    He is doing well from a cardiovascular standpoint.  His heart rate has been under control.  He is asking for [place] to try a lower dose of the carvedilol I do not think that is unreasonable so I am going to cut this down to 3.125 mg twice daily.  Continue his anticoagulation Eliquis 5 mg twice daily.  The patient is in agreement with the above plan. The patient left the office in stable condition.  The patient will follow up in 6 months or sooner if needed.   Medication Adjustments/Labs and Tests Ordered: Current medicines are reviewed at  length with the patient today.  Concerns regarding medicines are outlined above.  No orders of the defined types were placed in this encounter.  Meds ordered this encounter  Medications   carvedilol (COREG) 3.125 MG tablet    Sig: Take 1  tablet (3.125 mg total) by mouth 2 (two) times daily.    Dispense:  180 tablet    Refill:  3    Patient Instructions  Medication Instructions:  Your physician has recommended you make the following change in your medication:  DECREASE: Coreg 3.125 mg twice daily *If you need a refill on your cardiac medications before your next appointment, please call your pharmacy*   Lab Work: None If you have labs (blood work) drawn today and your tests are completely normal, you will receive your results only by: [place] (if you have MyChart) OR A paper copy in the mail If you have any lab test that is abnormal or we need to change your treatment, we will call you to review the results.   Testing/Procedures: None   Follow-Up: At [facility], you and your health needs are our priority.  As part of our continuing mission to provide you with exceptional heart care, we have created designated Provider Care Teams.  These Care Teams include your primary Cardiologist (physician) and Advanced Practice Providers (APPs -  Physician Assistants and Nurse Practitioners) who all work together to provide you with the care you need, when you need it.  We recommend signing up for the patient portal called "MyChart".  Sign up information is provided on this After Visit Summary.  MyChart is used to connect with patients for Virtual Visits (Telemedicine).  Patients are able to view lab/test results, encounter notes, upcoming appointments, etc.  Non-urgent messages can be sent to your provider as well.   To learn more about what you can do with MyChart, go to [email or web address].    Your next appointment:   6 month(s)  The format for your next appointment:   In Person  Provider:   [name], DO     Other Instructions   Important Information About Sugar         Adopting a Healthy Lifestyle.  Know what a healthy weight is for you (roughly BMI <25) and aim to maintain this   Aim for  7+ servings of fruits and vegetables daily   65-80+ fluid ounces of water or unsweet tea for healthy kidneys   Limit to max 1 drink of alcohol per day; avoid smoking/tobacco   Limit animal fats in diet for cholesterol and heart health - choose grass fed whenever available   Avoid highly processed foods, and foods high in saturated/trans fats   Aim for low stress - take time to unwind and care for your mental health   Aim for 150 min of moderate intensity exercise weekly for heart health, and weights twice weekly for bone health   Aim for 7-9 hours of sleep daily   When it comes to diets, agreement about the perfect plan isnt easy to find, even among the experts. Experts at the [employer] developed an idea known as the Healthy Eating Plate. Just imagine a plate divided into logical, healthy portions.   The emphasis is on diet quality:   Load up on vegetables and fruits - one-half of your plate: Aim for color and variety, and remember that potatoes dont count.   Go for whole grains - one-quarter of your plate: Whole wheat,  barley, wheat berries, quinoa, oats, brown rice, and foods made with them. If you want pasta, go with whole wheat pasta.   Protein power - one-quarter of your plate: Fish, chicken, beans, and nuts are all healthy, versatile protein sources. Limit red meat.   The diet, however, does go beyond the plate, offering a few other suggestions.   Use healthy plant oils, such as olive, canola, soy, corn, sunflower and peanut. Check the labels, and avoid partially hydrogenated oil, which have unhealthy trans fats.   If youre thirsty, drink water. Coffee and tea are good in moderation, but skip sugary drinks and limit milk and dairy products to one or two daily servings.   The type of carbohydrate in the diet is more important than the amount. Some sources of carbohydrates, such as vegetables, fruits, whole grains, and beans-are healthier than others.    Finally, stay active  Signed, [name], DO  06/09/2022 4:20 PM    [facility]

## 2022-06-08 NOTE — Patient Instructions (Addendum)
Medication Instructions:  Your physician has recommended you make the following change in your medication:  DECREASE: Coreg 3.125 mg twice daily *If you need a refill on your cardiac medications before your next appointment, please call your pharmacy*   Lab Work: None If you have labs (blood work) drawn today and your tests are completely normal, you will receive your results only by: [place] (if you have MyChart) OR A paper copy in the mail If you have any lab test that is abnormal or we need to change your treatment, we will call you to review the results.   Testing/Procedures: None   Follow-Up: At [facility], you and your health needs are our priority.  As part of our continuing mission to provide you with exceptional heart care, we have created designated Provider Care Teams.  These Care Teams include your primary Cardiologist (physician) and Advanced Practice Providers (APPs -  Physician Assistants and Nurse Practitioners) who all work together to provide you with the care you need, when you need it.  We recommend signing up for the patient portal called "MyChart".  Sign up information is provided on this After Visit Summary.  MyChart is used to connect with patients for Virtual Visits (Telemedicine).  Patients are able to view lab/test results, encounter notes, upcoming appointments, etc.  Non-urgent messages can be sent to your provider as well.   To learn more about what you can do with MyChart, go to [email or web address].    Your next appointment:   6 month(s)  The format for your next appointment:   In Person  Provider:   [name], DO     Other Instructions   Important Information About Sugar

## 2022-07-18 IMAGING — DX DG ANKLE COMPLETE 3+V*L*
3 series · 3 of 3 positions shown · non-contrast
Comparison: None.

CLINICAL DATA: Wrench fell on ankle

EXAM:
LEFT ANKLE COMPLETE - 3+ VIEW

[ankle ap]
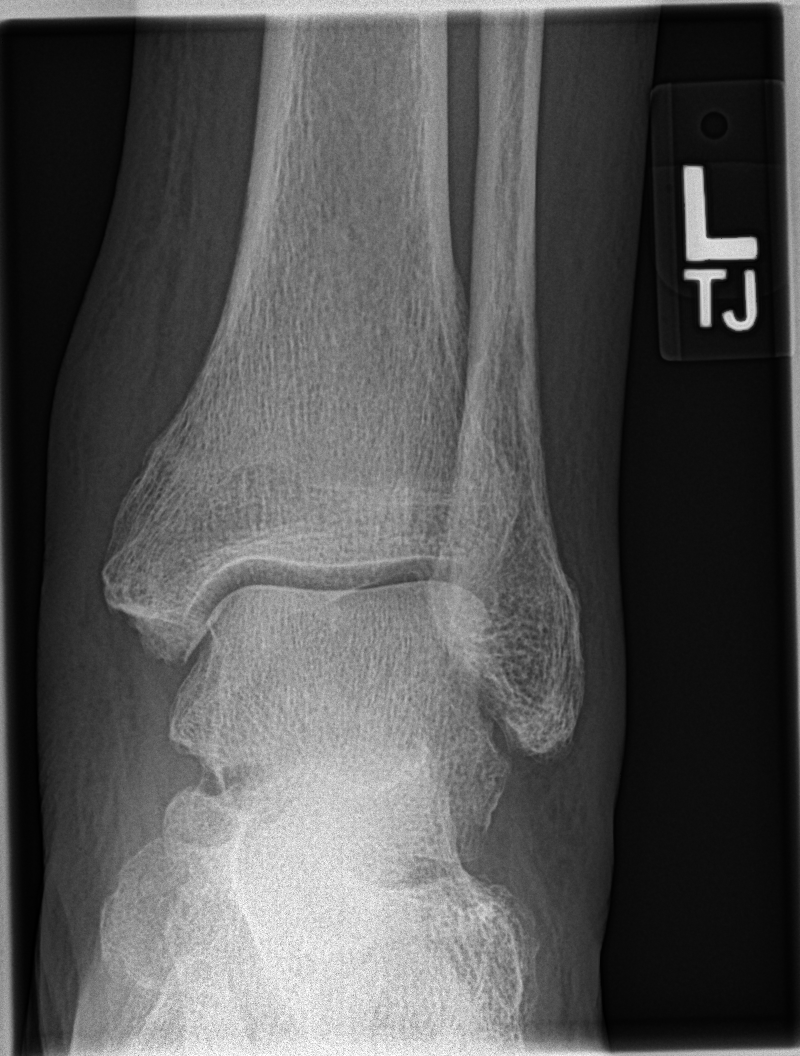

[ankle mortise]
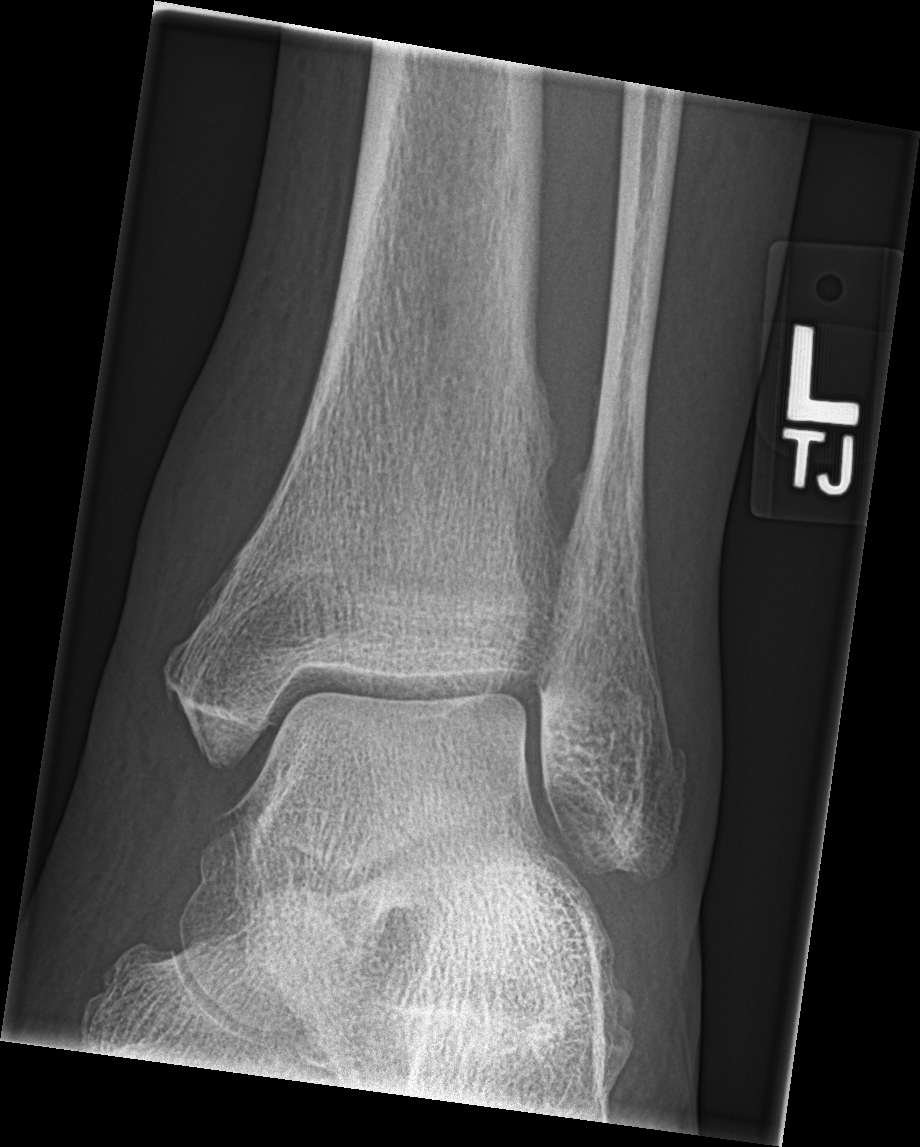

[ankle lat]
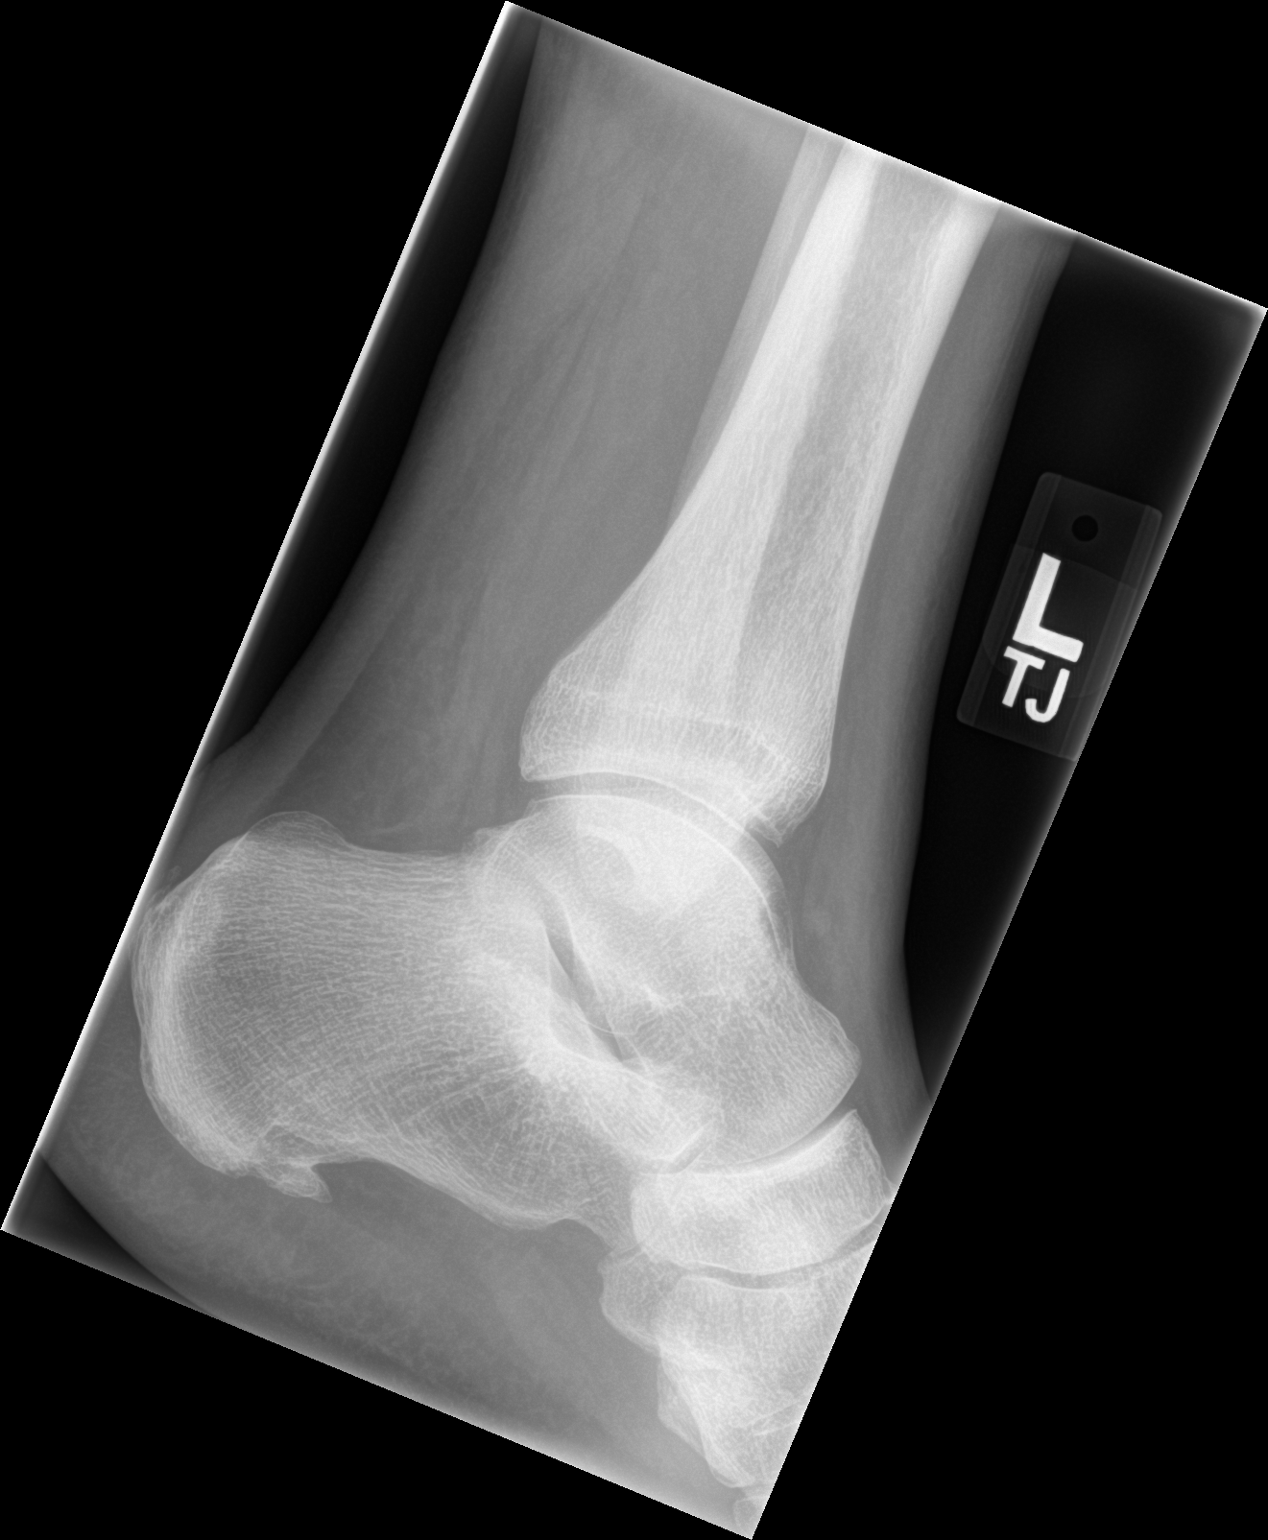

[3 of 3 positions shown; findings below may reference images not displayed]

FINDINGS: Frontal, oblique, and lateral views were obtained. There is soft
tissue swelling. There is no appreciable fracture or joint effusion.
There is no appreciable joint space narrowing or erosion. There is
an inferior calcaneal spur. Ankle mortise appears intact.
IMPRESSION: Soft tissue swelling. No fracture evident. No appreciable joint
space narrowing. Inferior calcaneal spur noted. Ankle mortise
appears intact.

## 2022-07-20 ENCOUNTER — Other Ambulatory Visit: Payer: Self-pay

## 2022-08-26 DIAGNOSIS — M1711 Unilateral primary osteoarthritis, right knee: Secondary | ICD-10-CM | POA: Diagnosis not present

## 2022-08-26 DIAGNOSIS — M25561 Pain in right knee: Secondary | ICD-10-CM | POA: Diagnosis not present

## 2022-09-02 DIAGNOSIS — M25561 Pain in right knee: Secondary | ICD-10-CM | POA: Diagnosis not present

## 2022-09-02 DIAGNOSIS — M1711 Unilateral primary osteoarthritis, right knee: Secondary | ICD-10-CM | POA: Diagnosis not present

## 2022-09-04 ENCOUNTER — Inpatient Hospital Stay: Payer: Medicare HMO | Attending: Hematology and Oncology

## 2022-09-04 ENCOUNTER — Other Ambulatory Visit: Payer: Self-pay

## 2022-09-04 ENCOUNTER — Telehealth: Payer: Self-pay

## 2022-09-04 ENCOUNTER — Inpatient Hospital Stay (HOSPITAL_BASED_OUTPATIENT_CLINIC_OR_DEPARTMENT_OTHER): Payer: Medicare HMO | Admitting: Hematology and Oncology

## 2022-09-04 VITALS — BP 143/60 | HR 66 | Temp 98.2°F | Resp 16 | Wt 150.2 lb

## 2022-09-04 DIAGNOSIS — R110 Nausea: Secondary | ICD-10-CM | POA: Diagnosis not present

## 2022-09-04 DIAGNOSIS — R161 Splenomegaly, not elsewhere classified: Secondary | ICD-10-CM | POA: Insufficient documentation

## 2022-09-04 DIAGNOSIS — C8307 Small cell B-cell lymphoma, spleen: Secondary | ICD-10-CM

## 2022-09-04 DIAGNOSIS — Z7952 Long term (current) use of systemic steroids: Secondary | ICD-10-CM | POA: Diagnosis not present

## 2022-09-04 DIAGNOSIS — I4892 Unspecified atrial flutter: Secondary | ICD-10-CM | POA: Insufficient documentation

## 2022-09-04 DIAGNOSIS — D696 Thrombocytopenia, unspecified: Secondary | ICD-10-CM | POA: Insufficient documentation

## 2022-09-04 DIAGNOSIS — M109 Gout, unspecified: Secondary | ICD-10-CM | POA: Insufficient documentation

## 2022-09-04 DIAGNOSIS — D72820 Lymphocytosis (symptomatic): Secondary | ICD-10-CM | POA: Diagnosis not present

## 2022-09-04 LAB — CMP (CANCER CENTER ONLY)
ALT: 11 U/L (ref 0–44)
AST: 25 U/L (ref 15–41)
Albumin: 4.1 g/dL (ref 3.5–5.0)
Alkaline Phosphatase: 82 U/L (ref 38–126)
Anion gap: 4 — ABNORMAL LOW (ref 5–15)
BUN: 31 mg/dL — ABNORMAL HIGH (ref 8–23)
CO2: 33 mmol/L — ABNORMAL HIGH (ref 22–32)
Calcium: 9.4 mg/dL (ref 8.9–10.3)
Chloride: 105 mmol/L (ref 98–111)
Creatinine: 1.03 mg/dL (ref 0.61–1.24)
GFR, Estimated: >60 mL/min (ref >60)
Glucose, Bld: 142 mg/dL — ABNORMAL HIGH (ref 70–99)
Potassium: 4.5 mmol/L (ref 3.5–5.1)
Sodium: 142 mmol/L (ref 135–145)
Total Bilirubin: 0.6 mg/dL (ref 0.3–1.2)
Total Protein: 6.7 g/dL (ref 6.5–8.1)

## 2022-09-04 LAB — CBC WITH DIFFERENTIAL (CANCER CENTER ONLY)
Abs Immature Granulocytes: 0.48 10*3/uL — ABNORMAL HIGH (ref 0.00–0.07)
Basophils Absolute: 0.1 10*3/uL (ref 0.0–0.1)
Basophils Relative: 0 %
Eosinophils Absolute: 0.7 10*3/uL — ABNORMAL HIGH (ref 0.0–0.5)
Eosinophils Relative: 0 %
HCT: 38 % — ABNORMAL LOW (ref 39.0–52.0)
Hemoglobin: 12.3 g/dL — ABNORMAL LOW (ref 13.0–17.0)
Immature Granulocytes: 0 %
Lymphocytes Relative: 95 %
Lymphs Abs: 172.3 10*3/uL — ABNORMAL HIGH (ref 0.7–4.0)
MCH: 31.4 pg (ref 26.0–34.0)
MCHC: 32.4 g/dL (ref 30.0–36.0)
MCV: 96.9 fL (ref 80.0–100.0)
Monocytes Absolute: 5 10*3/uL — ABNORMAL HIGH (ref 0.1–1.0)
Monocytes Relative: 3 %
Neutro Abs: 3.7 10*3/uL (ref 1.7–7.7)
Neutrophils Relative %: 2 %
Platelet Count: 126 10*3/uL — ABNORMAL LOW (ref 150–400)
RBC: 3.92 MIL/uL — ABNORMAL LOW (ref 4.22–5.81)
RDW: 15.1 % (ref 11.5–15.5)
Smear Review: NORMAL
WBC Count: 182.3 10*3/uL (ref 4.0–10.5)
nRBC: 0 % (ref 0.0–0.2)

## 2022-09-04 LAB — LACTATE DEHYDROGENASE: LDH: 168 U/L (ref 98–192)

## 2022-09-04 NOTE — Progress Notes (Signed)
Chronic Care Management Pharmacy Assistant   Name: Keith Jordan  MRN: [id number] DOB: [birth year]  Reason for Encounter: CCM (Appointment Reminder)  Recent office visits:  05/29/22 [name], MD Right Foot Pain Start: Olopatadine HCl 0.2 % SOLN Change: predniSONE (DELTASONE) 20 MG tablet 05/27/22 Start: Prednisone 40 mg  05/26/22 Critical Results: WBC 140,000 04/13/22 [name], MD Cellulitis and abscess of right leg Start: doxycycline (VIBRA-TABS) 100 MG tablet Stop (completed): doxycycline (VIBRA-TABS) 100 MG tablet Use tylenol and try volatren gel to the foot 04/07/22 [name], MD Insect Bite Start: Cephalexin 500 mg 11/17/2021 - [name], MD - Insomnia - Positive protein POCT Urinalysis.  11/03/2021 - [name], MD -Telephone - Stop due to balance issues: (NASACORT). Start: OTC Flonase.  10/13/2021 - [name], MD - Annual Wellness Visit - Abnormal Labs "White cells very high WBC 99.3, platelets low - both stable. No longer anemic. Cholesterol levels are overall well controlled on low dose lipitor. Kidney function remains slightly impaired but stable" No med changes.   Recent consult visits:  06/08/22 [name], DO (Cardiology) Typical Atrial Flutter Change: Carvedilol 3.125 mg FU 6 months 06/04/22 Narda Rutherford, MD (Oncology) Splenic marginal zone b-cell lymphoma FU 3 months 06/03/22 Benay Pillow (Ophthalmology) Nonexudative age-related macular degeneration, bilateral, intermediate dry stage 05/26/22 Romilda Garret, NP Right foot pain Abnormal Labs: "Red and white blood cells are stable for the patient. Liver and kidneys look good. Uric acid also looks good. The xray did not show any fracture or broken bone. With the amount of swelling they are concerned that he could have cellulitis ( infection of the skin). See how he is doing, has the redness spread from the great toe area? What about the swelling? I will also likely go ahead and treat him  with an antibiotic. But want to get the information first please."   03/04/2022 - Narda Rutherford - Oncology - splenic marginal zone b-cell lymphoma.Abnormal Lab: WBC=131.1k 02/03/2022 - [name] Tobb, DO - Cardiology - Med Management - Abnormal labs: No provider notes. Start: COREG - 6.25 MG tab. Stop: CARDIZEM CD and TOPROL-XL. 01/21/2022 - [name], DO - Cardiology - Start due to heart racing and palpitations Cardizem 120 mg.  12/11/2021 - Godfrey Pick Tobb, DO - Cardiology - STOP due to hot flash in chest, legs and feet Metoprolol.  12/03/2021 - Critical Lab - WBC 137.8 12/03/2021 - Narda Rutherford, MD - Hematology - splenic marginal zone b-cell lymphoma.  11/26/2021 - Benay Pillow - Ophthalmology - macular degeneration.  10/21/2021 - Completed  - DG Lumbar Spine 10/17/2021 - Long Term Monitor Zio applied 09/26/2021 - [name], MD - Family Medicine - Hyperlipidemia. No other information.   Hospital visits:  None in previous 6 months / Medications: Outpatient Encounter Medications as of 09/04/2022  Medication Sig   acetaminophen (TYLENOL) 500 MG tablet Take 500 mg by mouth every 6 (six) hours as needed for mild pain.   apixaban (ELIQUIS) 5 MG TABS tablet TAKE 1 TABLET BY MOUTH TWICE A DAY   Ascorbic Acid (VITAMIN C) 1000 MG tablet Take 500 mg by mouth 2 (two) times daily.   atorvastatin (LIPITOR) 10 MG tablet Take 1 tablet (10 mg total) by mouth daily.   carboxymethylcellulose (REFRESH PLUS) 0.5 % SOLN Place 1 drop into both eyes 2 (two) times daily as needed (dry eyes).   carvedilol (COREG) 3.125 MG tablet Take 1 tablet (3.125 mg total) by mouth 2 (two) times daily.   Multiple Vitamins-Minerals (MULTIVITAMIN WITH  MINERALS) tablet Take 1 tablet by mouth daily. Unknown strenght   Multiple Vitamins-Minerals (PRESERVISION AREDS) CAPS Take 1 capsule by mouth in the morning and at bedtime. (Patient taking differently: Take 1 capsule by mouth in the morning and at bedtime. Unknown strenght)    Olopatadine HCl 0.2 % SOLN Apply 1 drop to eye daily. (Both)   ondansetron (ZOFRAN) 8 MG tablet Take 1 tablet (8 mg total) by mouth every 8 (eight) hours as needed for nausea or vomiting.   predniSONE (DELTASONE) 20 MG tablet Take two tablets daily for total of 4 days followed by one tablet daily for 4 days (to complete previous prednisone course)   sodium chloride (OCEAN) 0.65 % SOLN nasal spray Place 1 spray into both nostrils as needed for congestion.   triamcinolone (NASACORT) 55 MCG/ACT AERO nasal inhaler Place 1 spray into the nose daily as needed (sinus congestion).   No facility-administered encounter medications on file as of 09/04/2022.   Lab Results  Component Value Date/Time   HGBA1C 6.4 (A) 10/13/2021 12:54 PM   HGBA1C 6.3 10/03/2020 11:15 AM   HGBA1C 6.3 09/28/2019 09:17 AM   HGBA1C 6.5 04/16/2014 12:00 AM   MICROALBUR 2.9 (H) 09/26/2018 09:22 AM   MICROALBUR 0.8 03/25/2018 10:08 AM    BP Readings from Last 3 Encounters:  06/08/22 126/64  06/04/22 140/65  05/29/22 138/70    Patient contacted to confirm telephone appointment with Charlene Brooke, Pharm D, on 09/09/2022 at 1:30.  Do you have any problems getting your medications? No  What is your top health concern you would like to discuss at your upcoming visit? None  Have you seen any other providers since your last visit with PCP? Yes  Care Gaps: Annual wellness visit in last year? No Most Recent BP reading: 126/64 on 06/08/2022  CCM referral has been placed prior to visit?  No   Star Rating Drugs: Medication:  Last Fill: Day Supply Atorvastatin 10 mg 07/23/2022 [place], CPP notified  Marijean Niemann, [place] Pharmacy Assistant [phone]

## 2022-09-04 NOTE — Progress Notes (Unsigned)
[facility] [phone]   [phone]  PROGRESS NOTE  Patient Care Team: [name], MD as PCP - General (Family Medicine) [name], DO as PCP - Cardiology (Cardiology) [facility], [facility] as Consulting Physician (Orthopedic Surgery) Debbora Dus, [facility] as Pharmacist (Pharmacist)  Hematological/Oncological History # Splenic Marginal Zone Lymphoma 01/30/2021: WBC 186.3, Hgb 13.5, MCV 96.3, Plt 132. Referred to Emergency Department. 02/05/2021: establish care with Dr. Lorenso Courier  02/24/2021: Week 1 of Monotherapy Rituximab 03/03/2021:  Week 2 of Monotherapy Rituximab 03/10/2021:  Week 3 of Monotherapy Rituximab. WBC 109.8 03/17/2021: Week 4 of Monotherapy Rituximab. WBC 94.8 03/24/2021: WBC 59.8 05/01/2021: WBC 89.1, Hgb 13.5, Plt 136 09/03/2021: WBC 97.8, Hgb 12.9, Plt 141 12/03/2021: WBC 137.8, Hgb 13.6, MCV 96.1, Plt 130 03/04/2022: WBC 131.1, Hgb 12.0, MCV 97.7, Plt 121 06/04/2022: WBC 144.9, Hgb 12.5, MCV 96.5, Plt 156 09/04/2022: WBC 182.3, Plt 12.3, MCV 96.9, Plt 126  Interval History:  Keith Jordan 81 y.o. male with medical history significant for splenic marginal zone lymphoma who presents for a follow up visit. The patient's last visit was on 06/04/2022. In the interim since the last visit he has been in stable health.   On exam today Mr. Noe reports he has been good in the last 3 months since her last visit.  He reports his energy is about a 5 or 6 out of 10.  He reports that he does "take anatomy".  He notes that he tends to stay in after about 11:00 or 12 PM due to the heat.  He cuts his grass in the late evening.  He notes he is not having any swelling or pain in his abdomen.  He denies any dark stools.  He is eating well and overall feels quite good.  He did develop gout after dropping a can of soup on his foot.  This has subsequently resolved.  Overall he is at his baseline level of health.  He denies any fevers, chills, sweats,  nausea, vomiting or diarrhea.  A full 10 point ROS is listed below.  MEDICAL HISTORY:  Past Medical History:  Diagnosis Date   Abnormal breath sounds 10/11/2020   Acute sinusitis 01/29/2021   Advanced care planning/counseling discussion 09/01/2016   BPH (benign prostatic hyperplasia) 09/29/2018   Ear fullness, bilateral 04/03/2021   Health maintenance examination 09/01/2016   Hyperlipidemia    Irregular heart beat 03/24/2021   Ischemic optic neuropathy 2006   [place] [facility]   Leukocytosis 01/30/2021   Severe s/p ER eval 01/2021   Medicare annual wellness visit, subsequent 10/04/2019   Nausea without vomiting 03/24/2021   Personal history of colonic adenoma 07/30/2008   Prediabetes 07/05/2016   A1c 6.5% 03/2014    Prediabetes 07/05/2016   A1c 6.5% 03/2014    Primary osteoarthritis of knee    bilateral s/p L knee replacement, receives R knee injections [name], [name])   Sebaceous cyst 01/30/2020   Splenic marginal zone b-cell lymphoma ([place]) 02/05/2021    SURGICAL HISTORY: Past Surgical History:  Procedure Laterality Date   A-FLUTTER ABLATION N/A 07/11/2021   Procedure: A-FLUTTER ABLATION;  Surgeon: [name], MD;  Location: [facility];  Service: Cardiovascular;  Laterality: N/A;   COLONOSCOPY  12/2013   no polyps, no rpt due [name])   CYST EXCISION  01/2020   TOTAL KNEE ARTHROPLASTY Left 2005   [name]    SOCIAL HISTORY: Social History   Socioeconomic History   Marital status: Married  Spouse name: Not on file   Number of children: Not on file   Years of education: Not on file   Highest education level: Not on file  Occupational History   Not on file  Tobacco Use   Smoking status: Never   Smokeless tobacco: Never  Vaping Use   Vaping Use: Never used  Substance and Sexual Activity   Alcohol use: Yes    Comment: occasional   Drug use: No   Sexual activity: Not Currently  Other Topics Concern   Not on file  Social History Narrative   Lives with wife, 2  cats   Occ: retired Personal assistant   Activity: works in yard   Diet: some water, fruits/vegetables daily   Social Determinants of Radio broadcast assistant Strain: [place]  (09/08/2021)   Overall Financial Resource Strain (CARDIA)    Difficulty of Paying Living Expenses: Not very hard  Food Insecurity: No Food Insecurity (10/04/2020)   Hunger Vital Sign    Worried About Running Out of Food in the Last Year: Never true    [place] in the Last Year: Never true  Transportation Needs: No Transportation Needs (10/04/2020)   PRAPARE - Hydrologist (Medical): No    Lack of Transportation (Non-Medical): No  Physical Activity: Inactive (10/04/2020)   Exercise Vital Sign    Days of Exercise per Week: 0 days    Minutes of Exercise per Session: 0 min  Stress: No Stress Concern Present (10/04/2020)   [place]    Feeling of Stress : Not at all  Social Connections: Not on file  Intimate Partner Violence: Not At Risk (10/04/2020)   Humiliation, Afraid, Rape, and Kick questionnaire    Fear of Current or Ex-Partner: No    Emotionally Abused: No    Physically Abused: No    Sexually Abused: No    FAMILY HISTORY: Family History  Problem Relation Age of Onset   Blindness Mother 34       ?stroke in eyes   Lung disease Father        coal miner   Arthritis Father    Colon cancer Neg Hx    Pancreatic cancer Neg Hx    Stomach cancer Neg Hx    Esophageal cancer Neg Hx    Rectal cancer Neg Hx    Cancer Neg Hx    Diabetes Neg Hx    CAD Neg Hx    Stroke Neg Hx     ALLERGIES:  has No Known Allergies.  MEDICATIONS:  Current Outpatient Medications  Medication Sig Dispense Refill   acetaminophen (TYLENOL) 500 MG tablet Take 500 mg by mouth every 6 (six) hours as needed for mild pain.     apixaban (ELIQUIS) 5 MG TABS tablet TAKE 1 TABLET BY MOUTH TWICE A DAY 180 tablet 1   Ascorbic Acid (VITAMIN C)  1000 MG tablet Take 500 mg by mouth 2 (two) times daily.     atorvastatin (LIPITOR) 10 MG tablet Take 1 tablet (10 mg total) by mouth daily. 90 tablet 3   carboxymethylcellulose (REFRESH PLUS) 0.5 % SOLN Place 1 drop into both eyes 2 (two) times daily as needed (dry eyes).     carvedilol (COREG) 3.125 MG tablet Take 1 tablet (3.125 mg total) by mouth 2 (two) times daily. 180 tablet 3   Multiple Vitamins-Minerals (MULTIVITAMIN WITH MINERALS) tablet Take 1 tablet by mouth daily. Unknown  strenght     Multiple Vitamins-Minerals (PRESERVISION AREDS) CAPS Take 1 capsule by mouth in the morning and at bedtime. (Patient taking differently: Take 1 capsule by mouth in the morning and at bedtime. Unknown strenght) 180 capsule 3   Olopatadine HCl 0.2 % SOLN Apply 1 drop to eye daily. (Both) 2.5 mL 0   ondansetron (ZOFRAN) 8 MG tablet Take 1 tablet (8 mg total) by mouth every 8 (eight) hours as needed for nausea or vomiting. 30 tablet 0   predniSONE (DELTASONE) 20 MG tablet Take two tablets daily for total of 4 days followed by one tablet daily for 4 days (to complete previous prednisone course) 7 tablet 0   sodium chloride (OCEAN) 0.65 % SOLN nasal spray Place 1 spray into both nostrils as needed for congestion.     triamcinolone (NASACORT) 55 MCG/ACT AERO nasal inhaler Place 1 spray into the nose daily as needed (sinus congestion). 1 each    No current facility-administered medications for this visit.    REVIEW OF SYSTEMS:   Constitutional: ( - ) fevers, ( - )  chills , ( - ) night sweats Eyes: ( - ) blurriness of vision, ( - ) double vision, ( - ) watery eyes Ears, nose, mouth, throat, and face: ( - ) mucositis, ( - ) sore throat Respiratory: ( - ) cough, ( - ) dyspnea, ( - ) wheezes Cardiovascular: ( - ) palpitation, ( - ) chest discomfort, ( - ) lower extremity swelling Gastrointestinal:  ( - ) nausea, ( - ) heartburn, ( - ) change in bowel habits Skin: ( - ) abnormal skin rashes Lymphatics: ( - ) new  lymphadenopathy, ( - ) easy bruising Neurological: ( - ) numbness, ( -) tingling, ( - ) new weaknesses Behavioral/Psych: ( - ) mood change, ( - ) new changes  All other systems were reviewed with the patient and are negative.  PHYSICAL EXAMINATION: ECOG PERFORMANCE STATUS: 1 - Symptomatic but completely ambulatory  Vitals:   09/04/22 1129  BP: (!) 143/60  Pulse: 66  Resp: 16  Temp: 98.2 F (36.8 C)  SpO2: 95%    Filed Weights   09/04/22 1129  Weight: 150 lb 3.2 oz (68.1 kg)    GENERAL: well appearing elderly Caucasian male alert, no distress and comfortable SKIN: skin color, texture, turgor are normal, no rashes or significant lesions EYES: conjunctiva are pink and non-injected, sclera clear LUNGS: clear to auscultation and percussion with normal breathing effort HEART: regular rate & rhythm and no murmurs and no lower extremity edema Musculoskeletal: no cyanosis of digits and no clubbing  PSYCH: alert & oriented x 3, fluent speech NEURO: no focal motor/sensory deficits  LABORATORY DATA:  I have reviewed the data as listed    Latest Ref Rng & Units 09/04/2022   11:10 AM 06/04/2022   10:30 AM 05/26/2022   10:41 AM  CBC  WBC 4.0 - 10.5 K/uL 182.3  144.9  140.0 Repeated and verified X2.   Hemoglobin 13.0 - 17.0 g/dL 12.3  12.5  12.3   Hematocrit 39.0 - 52.0 % 38.0  38.6  40.3   Platelets 150 - 400 K/uL 126  156  115.0        Latest Ref Rng & Units 09/04/2022   11:10 AM 06/04/2022   10:30 AM 05/26/2022   10:41 AM  CMP  Glucose 70 - 99 mg/dL 142  260  161   BUN 8 - 23 mg/dL 31  30  21  Creatinine 0.61 - 1.24 mg/dL 1.03  1.16  1.09   Sodium 135 - 145 mmol/L 142  138  142   Potassium 3.5 - 5.1 mmol/L 4.5  4.3  3.8   Chloride 98 - 111 mmol/L 105  101  103   CO2 22 - 32 mmol/L 33  33  32   Calcium 8.9 - 10.3 mg/dL 9.4  9.3  9.1   Total Protein 6.5 - 8.1 g/dL 6.7  6.3  6.4   Total Bilirubin 0.3 - 1.2 mg/dL 0.6  0.5  0.8   Alkaline Phos 38 - 126 U/L 82  71  81   AST 15 - 41  U/L 25  19  23    ALT 0 - 44 U/L 11  12  10     RADIOGRAPHIC STUDIES: No results found.   ASSESSMENT & PLAN KYCEN SPALLA 81 y.o. male with medical history significant for splenic marginal zone lymphoma. CT CAP from 02/13/21  showed only splenic involvement and a bone marrow biopsy from 02/21/21 confirmed the diagnosis.  The patient has only leukocytosis and splenomegaly.  The recommendation is monotherapy rituximab 375 mg per metered squared q. 7 days x 4 doses. He completed this on 03/17/2021.   At this time is marginal zone lymphoma appears like an indolent lymphocyte predominant variant.  These tend to behave more like CLL.  Given that I would recommend holding on treatment unless the patient were to have difficulties with splenomegaly, anemia, or worsening thrombocytopenia.  For treatment choices one could consider treatment with Ibrutinib/Zanbrutinib vs Bendamustine + ritux/obinutuzumab. Would need caution with ibrutinib given patient's cardiac history and current anticoagulation therapy.    Patient is doing well without any significant limitations. Labs from today show lymphocytosis and modest stable thrombocytopenia. Plan for patient to return to the clinic in 12 weeks for labs and 6 months for clinic visit.  #Splenic Marginal Zone Lymphoma, stable --findings are consistent with a splenic marginal zone lymphoma. --patient completed bone marrow biopsy and a CT chest abdomen pelvis. Disease appear limited to peripheral blood and spleen.  --Treatment of choice with confirmed splenic marginal zone lymphoma is rituximab 375 mg/m q. 7 days x 4 doses. He completed this on 03/17/2021.  --Due to persistent lymphocytosis, recommend to continue with allopurinol to prevent TLS. --if there is concern for worsening disease with an indication for treatment can consider treatment with Ibrutinib/Zanbrutinib vs Bendamustine + ritux/obinutuzumab. Would need caution with ibrutinib given patient's cardiac history  and current anticoagulation therapy.  --RTC in 6 months with interval 3 month labs   #Thrombocytopenia-improved --Plt today stable at 126, fluctuate.  --likely 2/2 to splenomegaly.   #Nausea --Take Zofran 8 mg q 8 hours PRN.   #Atrial Flutter --patient follows with Cardiology  No orders of the defined types were placed in this encounter.  All questions were answered. The patient knows to call the clinic with any problems, questions or concerns.  A total of more than 30 minutes were spent on this encounter and over half of that time was spent on counseling and coordination of care as outlined above.   Ledell Peoples, MD Department of Hematology/Oncology [place] [facility] Phone: [phone] Pager: [phone] Email: [email or web address]

## 2022-09-09 ENCOUNTER — Telehealth: Payer: Medicare HMO

## 2022-09-09 ENCOUNTER — Ambulatory Visit (INDEPENDENT_AMBULATORY_CARE_PROVIDER_SITE_OTHER): Payer: Medicare HMO | Admitting: Pharmacist

## 2022-09-09 DIAGNOSIS — R7303 Prediabetes: Secondary | ICD-10-CM

## 2022-09-09 DIAGNOSIS — E782 Mixed hyperlipidemia: Secondary | ICD-10-CM

## 2022-09-09 DIAGNOSIS — I483 Typical atrial flutter: Secondary | ICD-10-CM

## 2022-09-09 DIAGNOSIS — C8307 Small cell B-cell lymphoma, spleen: Secondary | ICD-10-CM

## 2022-09-09 NOTE — Progress Notes (Signed)
Chronic Care Management Pharmacy Note  09/09/2022 Name:  Keith Jordan MRN:  [id number] DOB:  Jan 19, 1941  Summary: CCM F/U visit -Reviewed medications; pt affirms compliance as prescribed -Pt reports "hot flashes" at night that wake him up; he thought carvedilol may be causing this, but reviewed literature and this is not a documented side effect. Discussed lymphoma can cause hot flashes  Recommendations/Changes made from today's visit: -Advised pt to contact oncology regarding hot flashes as lymphoma may be contributing  Plan: -Transition CCM to Self Care: Patient achieved CCM goals and no longer needs to be contacted as frequently. The patient has been provided with contact information for the care management team and has been advised to call with any health related questions or concerns.  -PCP CPE 10/26/22   Subjective: Keith Jordan is an 81 y.o. year old male who is a primary patient of [name], MD.  The CCM team was consulted for assistance with disease management and care coordination needs.    Engaged with patient by telephone for follow up visit in response to provider referral for pharmacy case management and/or care coordination services.   Consent to Services:  The patient was given information about Chronic Care Management services, agreed to services, and gave verbal consent prior to initiation of services.  Please see initial visit note for detailed documentation.   Patient Care Team: [name], MD as PCP - General (Family Medicine) [name], DO as PCP - Cardiology (Cardiology) [facility] [place] as Consulting Physician (Orthopedic Surgery) Charlton Haws, [employer] as Pharmacist (Pharmacist)  Recent office visits: 05/29/22 [name], MD Right Foot Pain Start: Olopatadine HCl 0.2 % SOLN Change: predniSONE (DELTASONE) 20 MG table 05/26/22 Critical Results: WBC 140,000 - start prednisone 04/13/22 [name], MD:  Cellulitis and abscess of right leg. Rx doxycyline 100 mg BID 04/07/22 Dr Einar Pheasant OV:  Insect Bite Start: Cephalexin 500 mg 11/17/2021 - [name], MD: insomnia, nocturia. UA. Consider melatonin 26m. Consult with cardiology re: trial off diltiazem 11/03/2021 - JRia Bush MD -Telephone - Stop due to balance issues: (NASACORT). Start: OTC Flonase.  10/13/2021 - JRia Bush MD - Annual Wellness Visit - Abnormal Labs "White cells very high WBC 99.3, platelets low - both stable. No longer anemic. Cholesterol levels are overall well controlled on low dose lipitor. Kidney function remains slightly impaired but stable" No med changes.   Recent consult visits: 09/04/22 Dr DLorenso Courier(Oncology): B-cell lymphoma - Dx 01/2021. S/p rituximab. Continue alllopurinol to prevent TLS.  06/08/22 KBerniece Salines DO (Cardiology): Aflutter. Reduce carvedilol to 3.125 mg BID 06/04/22 JNarda Rutherford MD (Oncology) B cell lymphoma. F/u 3 months. 06/03/22 BBenay Pillow(Ophthalmology): macular degeneration  03/04/2022 - JNarda Rutherford- Oncology - B-cell lymphoma. 02/03/2022 - KBerniece Salines DO - Cardiology - Med Management - Abnormal labs: No provider notes. Start: COREG - 6.25 MG tab. Stop: CARDIZEM CD and TOPROL-XL. 01/21/2022 - [name] Tobb, DO (Cardiology) Start diltiazem 120 mg (palpitations) 12/11/2021 - KGodfrey PickTobb, DO (Cardiology) - STOP due to hot flash in chest, legs and feet Metoprolol.   Hospital visits: None in previous 6 months   Objective:  Lab Results  Component Value Date   CREATININE 1.03 09/04/2022   BUN 31 (H) 09/04/2022   GFR 64.24 05/26/2022   EGFR 62 02/03/2022   GFRNONAA >60 09/04/2022   GFRAA  02/23/2008    >60        The eGFR has been calculated using the MDRD equation. This calculation has  not been validated in all clinical   NA 142 09/04/2022   K 4.5 09/04/2022   CALCIUM 9.4 09/04/2022   CO2 33 (H) 09/04/2022   GLUCOSE 142 (H) 09/04/2022    Lab Results  Component Value  Date/Time   HGBA1C 6.4 (A) 10/13/2021 12:54 PM   HGBA1C 6.3 10/03/2020 11:15 AM   HGBA1C 6.3 09/28/2019 09:17 AM   HGBA1C 6.5 04/16/2014 12:00 AM   FRUCTOSAMINE 306 (H) 10/13/2021 01:14 PM   GFR 64.24 05/26/2022 10:41 AM   GFR 57.49 (L) 10/13/2021 01:14 PM   MICROALBUR 2.9 (H) 09/26/2018 09:22 AM   MICROALBUR 0.8 03/25/2018 10:08 AM    Last diabetic Eye exam:  Lab Results  Component Value Date/Time   HMDIABEYEEXA No Retinopathy 06/27/2017 12:00 AM    Last diabetic Foot exam: No results found for: "HMDIABFOOTEX"   Lab Results  Component Value Date   CHOL 150 10/13/2021   HDL 35.30 (L) 10/13/2021   LDLCALC 81 10/13/2021   TRIG 168.0 (H) 10/13/2021   CHOLHDL 4 10/13/2021       Latest Ref Rng & Units 09/04/2022   11:10 AM 06/04/2022   10:30 AM 05/26/2022   10:41 AM  Hepatic Function  Total Protein 6.5 - 8.1 g/dL 6.7  6.3  6.4   Albumin 3.5 - 5.0 g/dL 4.1  3.8  4.0   AST 15 - 41 U/L _0 ALT 0 - 44 U/L _1 Alk Phosphatase 38 - 126 U/L 82  71  81   Total Bilirubin 0.3 - 1.2 mg/dL 0.6  0.5  0.8     Lab Results  Component Value Date/Time   TSH 1.969 01/30/2021 12:03 PM       Latest Ref Rng & Units 09/04/2022   11:10 AM 06/04/2022   10:30 AM 05/26/2022   10:41 AM  CBC  WBC 4.0 - 10.5 K/uL 182.3  144.9  140.0 Repeated and verified X2.   Hemoglobin 13.0 - 17.0 g/dL 12.3  12.5  12.3   Hematocrit 39.0 - 52.0 % 38.0  38.6  40.3   Platelets 150 - 400 K/uL 126  156  115.0     No results found for: "VD25OH"  Clinical ASCVD: No  The ASCVD Risk score (Arnett DK, et al., 2019) failed to calculate for the following reasons:   The 2019 ASCVD risk score is only valid for ages 34 to 70       10/13/2021   12:01 PM 10/04/2020    9:45 AM 10/04/2019   10:34 AM  Depression screen PHQ 2/9  Decreased Interest 0 0 0  Down, Depressed, Hopeless 0 0 0  PHQ - 2 Score 0 0 0  Altered sleeping  0   Tired, decreased energy  0   Change in appetite  0   Feeling bad or failure  about yourself   0   Trouble concentrating  0   Moving slowly or fidgety/restless  0   Suicidal thoughts  0   PHQ-9 Score  0   Difficult doing work/chores  Not difficult at all      CHA2DS2/VAS Stroke Risk Points  Current as of 2 days ago     4 >= 2 Points: High Risk  1 - 1.99 Points: Medium Risk  0 Points: Low Risk    Last Change: N/A       Points Metrics  0 Has Congestive Heart Failure:  No  Current as of 2 days ago  0 Has Vascular Disease:  No    Current as of 2 days ago  1 Has Hypertension:  Yes    Current as of 2 days ago  2 Age:  67    Current as of 2 days ago  1 Has Diabetes:  Yes    Current as of 2 days ago  0 Had Stroke:  No  Had TIA:  No  Had Thromboembolism:  No    Current as of 2 days ago  0 Male:  No    Current as of 2 days ago    Social History   Tobacco Use  Smoking Status Never  Smokeless Tobacco Never   BP Readings from Last 3 Encounters:  09/04/22 (!) 143/60  06/08/22 126/64  06/04/22 140/65   Pulse Readings from Last 3 Encounters:  09/04/22 66  06/08/22 67  06/04/22 61   Wt Readings from Last 3 Encounters:  09/04/22 150 lb 3.2 oz (68.1 kg)  06/08/22 148 lb 3.2 oz (67.2 kg)  06/04/22 148 lb 14.4 oz (67.5 kg)   BMI Readings from Last 3 Encounters:  09/04/22 22.18 kg/m  06/08/22 21.89 kg/m  06/04/22 21.99 kg/m    Assessment/Interventions: Review of patient past medical history, allergies, medications, health status, including review of consultants reports, laboratory and other test data, was performed as part of comprehensive evaluation and provision of chronic care management services.   SDOH:  (Social Determinants of Health) assessments and interventions performed: Yes SDOH Interventions    Flowsheet Row Chronic Care Management from 09/09/2022 in [place] at [place] Management from 09/08/2021 in [place] at [place] from 10/04/2020 in [place] at [place] from 09/14/2017 in [place] at [place] Interventions      Housing Interventions Intervention Not Indicated -- -- --  Depression Interventions/Treatment  -- -- ZOX0-9 Score <4 Follow-up Not Indicated --  [referral to PCP for further evaluation]  Financial Strain Interventions Intervention Not Indicated Intervention Not Indicated -- --      SDOH Screenings   Food Insecurity: No Food Insecurity (10/04/2020)  Housing: Low Risk  (09/09/2022)  Transportation Needs: No Transportation Needs (10/04/2020)  Alcohol Screen: Low Risk  (10/04/2020)  Depression (PHQ2-9): Low Risk  (10/13/2021)  Financial Resource Strain: Low Risk  (09/09/2022)  Physical Activity: Inactive (10/04/2020)  Stress: No Stress Concern Present (10/04/2020)  Tobacco Use: Low Risk  (06/08/2022)    [employer]  No Known Allergies  Medications Reviewed Today     Reviewed by Charlton Haws, [facility] (Pharmacist) on 09/09/22 at 1349  Med List Status: <None>   Medication Order Taking? Sig Documenting Provider Last Dose Status Informant  acetaminophen (TYLENOL) 500 MG tablet [id number] Yes Take 500 mg by mouth every 6 (six) hours as needed for mild pain. [provider] Taking Active Self  apixaban (ELIQUIS) 5 MG TABS tablet [id number] Yes TAKE 1 TABLET BY MOUTH TWICE A DAY Tobb, [name], DO Taking Active   Ascorbic Acid (VITAMIN C) 1000 MG tablet [id number] Yes Take 500 mg by mouth 2 (two) times daily. [provider] Taking Active Self  atorvastatin (LIPITOR) 10 MG tablet [id number] Yes Take 1 tablet (10 mg total) by mouth daily. [name], MD Taking Active   carboxymethylcellulose (REFRESH PLUS) 0.5 % SOLN [id number] Yes Place 1 drop into both eyes 2 (two) times daily as needed (dry eyes). [provider] Taking Active  Self  carvedilol (COREG) 3.125 MG tablet [id number] Yes Take 1 tablet (3.125 mg total) by mouth 2 (two) times daily. Tobb, [name], DO Taking Active    Multiple Vitamins-Minerals (MULTIVITAMIN WITH MINERALS) tablet [id number] Yes Take 1 tablet by mouth daily. Unknown strenght [provider] Taking Active Self  Multiple Vitamins-Minerals (PRESERVISION AREDS) CAPS [id number] Yes Take 1 capsule by mouth in the morning and at bedtime.  Patient taking differently: Take 1 capsule by mouth in the morning and at bedtime. Unknown strenght   [name], MD Taking Active   Olopatadine HCl 0.2 % SOLN [id number] Yes Apply 1 drop to eye daily. (Both) [name], MD Taking Active   sodium chloride (OCEAN) 0.65 % SOLN nasal spray [id number] Yes Place 1 spray into both nostrils as needed for congestion. [provider] Taking Active Self  triamcinolone (NASACORT) 55 MCG/ACT AERO nasal inhaler [id number] Yes Place 1 spray into the nose daily as needed (sinus congestion). [name], MD Taking Active             Patient Active Problem List   Diagnosis Date Noted   Allergic rhinitis 05/29/2022   Dizziness 05/29/2022   Right foot pain 05/26/2022   Cellulitis and abscess of right leg 04/13/2022   Insomnia 11/17/2021   Nocturia 11/17/2021   Elevated blood pressure reading 11/17/2021   Typical atrial flutter ([place]) 10/13/2021   Skin sensation disturbance 10/13/2021   Splenic marginal zone b-cell lymphoma ([place]) 02/05/2021   Abnormal breath sounds 10/11/2020   Sebaceous cyst 01/30/2020   Medicare annual wellness visit, subsequent 10/04/2019   BPH (benign prostatic hyperplasia) 09/29/2018   Health maintenance examination 09/01/2016   Advanced care planning/counseling discussion 09/01/2016   Prediabetes 07/05/2016   Hyperlipidemia    Primary osteoarthritis of knee    Ischemic optic neuropathy    Personal history of colonic adenoma 07/30/2008    Immunization History  Administered Date(s) Administered   Fluad Quad(high Dose 65+) 10/04/2019, 10/11/2020, 10/13/2021   Influenza Split 10/30/2013   Influenza,inj,Quad  PF,6+ Mos 09/14/2017, 09/26/2018   Influenza-Unspecified 09/01/2016   PFIZER(Purple Top)SARS-COV-2 Vaccination 02/03/2020, 02/24/2020   Pfizer Covid-19 Vaccine Bivalent Booster 85yr & up 11/27/2021   Pneumococcal Conjugate-13 09/08/2016   Pneumococcal Polysaccharide-23 09/14/2017    Conditions to be addressed/monitored:  Hyperlipidemia, Atrial Fibrillation, and B Cell lymphoma, prediabetes  Care [place] : [place] Updates made by FCharlton Haws RDyersince 09/09/2022 12:00 AM     Problem: Hyperlipidemia, Atrial Fibrillation, and B Cell lymphoma, prediabetes   Priority: High     Long-Range Goal: Disease Management   Start Date: 09/08/2021  Expected End Date: 09/10/2023  This Visit's Progress: On track  Priority: High  Note:   Current Barriers:  None identified  Pharmacist Clinical Goal(s):  Patient will contact provider office for questions/concerns as evidenced notation of same in electronic health record through collaboration with PharmD and provider.   Interventions: 1:1 collaboration with GRia Bush MD regarding development and update of comprehensive plan of care as evidenced by provider attestation and co-signature Inter-disciplinary care team collaboration (see longitudinal plan of care) Comprehensive medication review performed; medication list updated in electronic medical record  Hyperlipidemia: (LDL goal < 100) -Controlled - LDL 81 (09/2021) -Current treatment: Atorvastatin 10 mg daily -Appropriate, Effective, Safe, Accessible -Medications previously tried: none  -Current dietary patterns: see diabetes section -Current exercise habits: Yes - Patient will walk around the mall or [place] He is exercising more now that he had the Ablation done. He  mowed lawn last week. Lives with wife, she does cooking. -Denies any problem -Educated on Denies any side effects -Recommended to continue current medication  Pre-diabetes (A1c goal  <6.5%) -Controlled - A1c 6.4% (09/2021) -Current home glucose readings - does have a monitor and checks on occasion -Educated on A1c and blood sugar goals;  Atrial Fibrillation (Goal: prevent stroke and major bleeding) -Controlled -CHADSVASC: none -Current treatment: Carvedilol 3.125 mg BID - Appropriate, Effective, Safe, Accessible Eliquis 5 mg BID -Appropriate, Effective, Safe, Accessible -Medications previously tried: diltiazem   -Counseled on increased risk of stroke due to Afib and benefits of anticoagulation for stroke prevention; bleeding risk associated with Eliquis and importance of self-monitoring for signs/symptoms of bleeding; avoidance of NSAIDs due to increased bleeding risk with anticoagulants; -Recommended to continue current medication  B-cell Lymphoma -Stable - follows with heme/onc Dr Lorenso Courier -Pt complains of "hot flashes" at night, not really accompanied by significant sweating, but symptoms wake him up nightly and he kicks off covers and struggles to fall back asleep. At first he thought carvedilol was causing this, reviewed package insert/literature and carvedilol is not known to cause symptoms like this. -Per literature "hot flashes" are relatively common with lymphoma; advised pt to discuss symptoms with his oncologist  Patient Goals/Self-Care Activities Patient will:  - take medications as prescribed as evidenced by patient report and record review focus on medication adherence by routine        Medication Assistance: None required.  Patient affirms current coverage meets needs.  Compliance/Adherence/Medication fill history: Care Gaps: Foot exam, eye exam, kidney eval, A1c- not needed in prediabetes  Star-Rating Drugs: Atorvastatin - PDC 97%  Medication Access: Within the past 30 days, how often has patient missed a dose of medication? 0 Is a pillbox or other method used to improve adherence? No  Factors that may affect medication adherence? no  barriers identified Are meds synced by current pharmacy? No  Are meds delivered by current pharmacy? No  Does patient experience delays in picking up medications due to transportation concerns? No   [facility] Services Reviewed: Is patient disadvantaged to use [facility]?: No  Current Rx insurance plan: [employer] Name and location of Current pharmacy:  [id number] [place] [id number]: [phone]: [phone] [facility] services reviewed with patient today?: No  Patient requests to transfer care to [facility]?: No  Reason patient declined to change pharmacies: Too few medications to benefit from sync/delivery   Care Plan and Follow Up Patient Decision:  Patient agrees to Care Plan and Follow-up.  Plan: The patient has been provided with contact information for the care management team and has been advised to call with any health related questions or concerns.   LCharlene Brooke PharmD, BCACP Clinical Pharmacist [facility] [phone]

## 2022-09-09 NOTE — Patient Instructions (Addendum)
Visit Information  Phone number for Pharmacist: [phone]   Goals Addressed   None     Care Plan : [place]  Updates made by Charlton Haws, RPH since 09/09/2022 12:00 AM     Problem: Hyperlipidemia, Atrial Fibrillation, and B Cell lymphoma, prediabetes   Priority: High     Long-Range Goal: Disease Management   Start Date: 09/08/2021  Expected End Date: 09/10/2023  This Visit's Progress: On track  Priority: High  Note:   Current Barriers:  None identified  Pharmacist Clinical Goal(s):  Patient will contact provider office for questions/concerns as evidenced notation of same in electronic health record through collaboration with PharmD and provider.   Interventions: 1:1 collaboration with [name], MD regarding development and update of comprehensive plan of care as evidenced by provider attestation and co-signature Inter-disciplinary care team collaboration (see longitudinal plan of care) Comprehensive medication review performed; medication list updated in electronic medical record  Hyperlipidemia: (LDL goal < 100) -Controlled - LDL 81 (09/2021) -Current treatment: Atorvastatin 10 mg daily -Appropriate, Effective, Safe, Accessible -Medications previously tried: none  -Current dietary patterns: see diabetes section -Current exercise habits: Yes - Patient will walk around the mall or [place]. He is exercising more now that he had the Ablation done. He mowed lawn last week. Lives with wife, she does cooking. -Denies any problem -Educated on Denies any side effects -Recommended to continue current medication  Pre-diabetes (A1c goal <6.5%) -Controlled - A1c 6.4% (09/2021) -Current home glucose readings - does have a monitor and checks on occasion -Educated on A1c and blood sugar goals;  Atrial Fibrillation (Goal: prevent stroke and major bleeding) -Controlled -CHADSVASC: none -Current treatment: Carvedilol 3.125 mg BID - Appropriate,  Effective, Safe, Accessible Eliquis 5 mg BID -Appropriate, Effective, Safe, Accessible -Medications previously tried: diltiazem   -Counseled on increased risk of stroke due to Afib and benefits of anticoagulation for stroke prevention; bleeding risk associated with Eliquis and importance of self-monitoring for signs/symptoms of bleeding; avoidance of NSAIDs due to increased bleeding risk with anticoagulants; -Recommended to continue current medication  B-cell Lymphoma -Stable - follows with heme/onc Dr Lorenso Courier -Pt complains of "hot flashes" at night, not really accompanied by significant sweating, but symptoms wake him up nightly and he kicks off covers and struggles to fall back asleep. At first he thought carvedilol was causing this, reviewed package insert/literature and carvedilol is not known to cause symptoms like this. -Per literature "hot flashes" are relatively common with lymphoma; advised pt to discuss symptoms with his oncologist  Patient Goals/Self-Care Activities Patient will:  - take medications as prescribed as evidenced by patient report and record review focus on medication adherence by routine        Patient verbalizes understanding of instructions and care plan provided today and agrees to view in [place]. Active MyChart status and patient understanding of how to access instructions and care plan via MyChart confirmed with patient.    The patient has been provided with contact information for the care management team and has been advised to call with any health related questions or concerns.    Charlene Brooke, PharmD, BCACP Clinical Pharmacist [place] [facility] at [facility] [phone]

## 2022-09-10 DIAGNOSIS — M25561 Pain in right knee: Secondary | ICD-10-CM | POA: Diagnosis not present

## 2022-09-10 DIAGNOSIS — M1711 Unilateral primary osteoarthritis, right knee: Secondary | ICD-10-CM | POA: Diagnosis not present

## 2022-09-17 DIAGNOSIS — M25561 Pain in right knee: Secondary | ICD-10-CM | POA: Diagnosis not present

## 2022-09-17 DIAGNOSIS — M1711 Unilateral primary osteoarthritis, right knee: Secondary | ICD-10-CM | POA: Diagnosis not present

## 2022-09-24 DIAGNOSIS — M25561 Pain in right knee: Secondary | ICD-10-CM | POA: Diagnosis not present

## 2022-09-24 DIAGNOSIS — M1711 Unilateral primary osteoarthritis, right knee: Secondary | ICD-10-CM | POA: Diagnosis not present

## 2022-09-26 DIAGNOSIS — I4891 Unspecified atrial fibrillation: Secondary | ICD-10-CM

## 2022-09-26 DIAGNOSIS — E785 Hyperlipidemia, unspecified: Secondary | ICD-10-CM

## 2022-09-27 ENCOUNTER — Other Ambulatory Visit: Payer: Self-pay | Admitting: Cardiology

## 2022-09-27 DIAGNOSIS — I483 Typical atrial flutter: Secondary | ICD-10-CM

## 2022-09-28 NOTE — Telephone Encounter (Signed)
Prescription refill request for Eliquis received. Indication: Aflutter Last office visit: 06/08/22 (Tobb)  Scr: 1.03 (09/04/22)  Age: 81 Weight: 68.1kg  Appropriate dose and refill sent to requested pharmacy.

## 2022-10-18 ENCOUNTER — Other Ambulatory Visit: Payer: Self-pay | Admitting: Family Medicine

## 2022-10-18 DIAGNOSIS — C8307 Small cell B-cell lymphoma, spleen: Secondary | ICD-10-CM

## 2022-10-18 DIAGNOSIS — R7303 Prediabetes: Secondary | ICD-10-CM

## 2022-10-18 DIAGNOSIS — E782 Mixed hyperlipidemia: Secondary | ICD-10-CM

## 2022-10-20 ENCOUNTER — Other Ambulatory Visit (INDEPENDENT_AMBULATORY_CARE_PROVIDER_SITE_OTHER): Payer: Medicare HMO

## 2022-10-20 ENCOUNTER — Ambulatory Visit (INDEPENDENT_AMBULATORY_CARE_PROVIDER_SITE_OTHER): Payer: Medicare HMO

## 2022-10-20 VITALS — Wt 150.0 lb

## 2022-10-20 DIAGNOSIS — E782 Mixed hyperlipidemia: Secondary | ICD-10-CM

## 2022-10-20 DIAGNOSIS — Z0000 Encounter for general adult medical examination without abnormal findings: Secondary | ICD-10-CM

## 2022-10-20 DIAGNOSIS — R7303 Prediabetes: Secondary | ICD-10-CM

## 2022-10-20 LAB — COMPREHENSIVE METABOLIC PANEL
ALT: 10 U/L (ref 0–53)
AST: 24 U/L (ref 0–37)
Albumin: 4 g/dL (ref 3.5–5.2)
Alkaline Phosphatase: 73 U/L (ref 39–117)
BUN: 26 mg/dL — ABNORMAL HIGH (ref 6–23)
CO2: 32 mEq/L (ref 19–32)
Calcium: 8.9 mg/dL (ref 8.4–10.5)
Chloride: 103 mEq/L (ref 96–112)
Creatinine, Ser: 1.08 mg/dL (ref 0.40–1.50)
GFR: 64.77 mL/min (ref >60.00)
Glucose, Bld: 111 mg/dL — ABNORMAL HIGH (ref 70–99)
Potassium: 3.7 mEq/L (ref 3.5–5.1)
Sodium: 142 mEq/L (ref 135–145)
Total Bilirubin: 0.7 mg/dL (ref 0.2–1.2)
Total Protein: 6.2 g/dL (ref 6.0–8.3)

## 2022-10-20 LAB — LIPID PANEL
Cholesterol: 120 mg/dL (ref 0–200)
HDL: 29.9 mg/dL — ABNORMAL LOW (ref >39.00)
LDL Cholesterol: 72 mg/dL (ref 0–99)
NonHDL: 89.74
Total CHOL/HDL Ratio: 4
Triglycerides: 90 mg/dL (ref 0.0–149.0)
VLDL: 18 mg/dL (ref 0.0–40.0)

## 2022-10-20 LAB — MAGNESIUM: Magnesium: 2.1 mg/dL (ref 1.5–2.5)

## 2022-10-20 LAB — HEMOGLOBIN A1C: Hgb A1c MFr Bld: 6.7 % — ABNORMAL HIGH (ref 4.6–6.5)

## 2022-10-20 NOTE — Progress Notes (Signed)
Virtual Visit via Telephone Note  I connected with  Keith Jordan on 10/20/22 at  2:15 PM EDT by telephone and verified that I am speaking with the correct person using two identifiers.  Location: Patient: home Provider: [place] Persons participating in the virtual visit: [place]   I discussed the limitations, risks, security and privacy concerns of performing an evaluation and management service by telephone and the availability of in person appointments. The patient expressed understanding and agreed to proceed.  Interactive audio and video telecommunications were attempted between this nurse and patient, however failed, due to patient having technical difficulties OR patient did not have access to video capability.  We continued and completed visit with audio only.  Some vital signs may be absent or patient reported.   Dionisio David, LPN  Subjective:   Keith Jordan is a 81 y.o. male who presents for Medicare Annual/Subsequent preventive examination.  Review of Systems     Cardiac Risk Factors include: advanced age (>40mn, >>10women);male gender;dyslipidemia     Objective:    Today's Vitals   10/20/22 1428  Weight: 150 lb (68 kg)   Body mass index is 22.15 kg/m.     10/20/2022    2:20 PM 07/11/2021   12:10 PM 03/24/2021   10:24 AM 03/17/2021   10:04 AM 03/10/2021   10:51 AM 03/03/2021    9:51 AM 02/24/2021   12:29 PM  Advanced Directives  Does Patient Have a Medical Advance Directive? Yes Yes Yes Yes Yes Yes Yes  Type of AParamedicof [place]Stevens CreekLiving will  Healthcare Power of [place]awsonLiving will HCalcuttaLiving will HBruningLiving will HHavreLiving will  Does patient want to make changes to medical advance directive? No - Patient declined  No - Patient declined  No - Patient declined    Copy of [place] in Chart? Yes - validated most recent copy scanned in chart (See row information)  Yes - validated most recent copy scanned in chart (See row information) Yes - validated most recent copy scanned in chart (See row information) Yes - validated most recent copy scanned in chart (See row information) No - copy requested No - copy requested    Current Medications (verified) Outpatient Encounter Medications as of 10/20/2022  Medication Sig   acetaminophen (TYLENOL) 500 MG tablet Take 500 mg by mouth every 6 (six) hours as needed for mild pain.   Ascorbic Acid (VITAMIN C) 1000 MG tablet Take 500 mg by mouth 2 (two) times daily.   atorvastatin (LIPITOR) 10 MG tablet Take 1 tablet (10 mg total) by mouth daily.   carboxymethylcellulose (REFRESH PLUS) 0.5 % SOLN Place 1 drop into both eyes 2 (two) times daily as needed (dry eyes).   carvedilol (COREG) 3.125 MG tablet Take 1 tablet (3.125 mg total) by mouth 2 (two) times daily. (Patient taking differently: Take 6.25 mg by mouth 2 (two) times daily.)   ELIQUIS 5 MG TABS tablet TAKE 1 TABLET BY MOUTH TWICE A DAY   Multiple Vitamins-Minerals (MULTIVITAMIN WITH MINERALS) tablet Take 1 tablet by mouth daily. Unknown strenght   Multiple Vitamins-Minerals (PRESERVISION AREDS) CAPS Take 1 capsule by mouth in the morning and at bedtime. (Patient taking differently: Take 1 capsule by mouth in the morning and at bedtime. Unknown strenght)   Olopatadine HCl 0.2 % SOLN Apply 1 drop to eye daily. (Both)   sodium  chloride (OCEAN) 0.65 % SOLN nasal spray Place 1 spray into both nostrils as needed for congestion.   triamcinolone (NASACORT) 55 MCG/ACT AERO nasal inhaler Place 1 spray into the nose daily as needed (sinus congestion).   No facility-administered encounter medications on file as of 10/20/2022.    Allergies (verified) Patient has no known allergies.   History: Past Medical History:  Diagnosis Date   Abnormal breath sounds 10/11/2020   Acute sinusitis  01/29/2021   Advanced care planning/counseling discussion 09/01/2016   BPH (benign prostatic hyperplasia) 09/29/2018   Ear fullness, bilateral 04/03/2021   Health maintenance examination 09/01/2016   Hyperlipidemia    Irregular heart beat 03/24/2021   Ischemic optic neuropathy 2006   [place] [facility]   Leukocytosis 01/30/2021   Severe s/p ER eval 01/2021   Medicare annual wellness visit, subsequent 10/04/2019   Nausea without vomiting 03/24/2021   Personal history of colonic adenoma 07/30/2008   Prediabetes 07/05/2016   A1c 6.5% 03/2014    Prediabetes 07/05/2016   A1c 6.5% 03/2014    Primary osteoarthritis of knee    bilateral s/p L knee replacement, receives R knee injections [name], [name])   Sebaceous cyst 01/30/2020   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   Past Surgical History:  Procedure Laterality Date   A-FLUTTER ABLATION N/A 07/11/2021   Procedure: A-FLUTTER ABLATION;  Surgeon: [name], MD;  Location: [place] [facility];  Service: Cardiovascular;  Laterality: N/A;   COLONOSCOPY  12/2013   no polyps, no rpt due [name])   CYST EXCISION  01/2020   TOTAL KNEE ARTHROPLASTY Left 2005   [place]   Family History  Problem Relation Age of Onset   Blindness Mother 54       ?stroke in eyes   Lung disease Father        coal miner   Arthritis Father    Colon cancer Neg Hx    Pancreatic cancer Neg Hx    Stomach cancer Neg Hx    Esophageal cancer Neg Hx    Rectal cancer Neg Hx    Cancer Neg Hx    Diabetes Neg Hx    CAD Neg Hx    Stroke Neg Hx    Social History   Socioeconomic History   Marital status: Married    Spouse name: Not on file   Number of children: Not on file   Years of education: Not on file   Highest education level: Not on file  Occupational History   Not on file  Tobacco Use   Smoking status: Never   Smokeless tobacco: Never  Vaping Use   Vaping Use: Never used  Substance and Sexual Activity   Alcohol use: Yes    Comment:  occasional   Drug use: No   Sexual activity: Not Currently  Other Topics Concern   Not on file  Social History Narrative   Lives with wife, 2 cats   Occ: retired Personal assistant   Activity: works in yard   Diet: some water, fruits/vegetables daily   Social Determinants of Radio broadcast assistant Strain: [place]  (10/20/2022)   Overall Financial Resource Strain (CARDIA)    Difficulty of Paying Living Expenses: Not hard at all  Food Insecurity: No Food Insecurity (10/20/2022)   Hunger Vital Sign    Worried About Running Out of Food in the Last Year: Never true    Ran Out of Food in the Last Year: Never true  Transportation Needs:  No Transportation Needs (10/20/2022)   PRAPARE - Hydrologist (Medical): No    Lack of Transportation (Non-Medical): No  Physical Activity: Sufficiently Active (10/20/2022)   Exercise Vital Sign    Days of Exercise per Week: 3 days    Minutes of Exercise per Session: 60 min  Stress: No Stress Concern Present (10/20/2022)   [place]    Feeling of Stress : Not at all  Social Connections: Moderately Isolated (10/20/2022)   Social Connection and Isolation Panel [NHANES]    Frequency of Communication with Friends and Family: Twice a week    Frequency of Social Gatherings with Friends and Family: Once a week    Attends Religious Services: Never    Marine scientist or Organizations: No    Attends Music therapist: Never    Marital Status: Married    Tobacco Counseling Counseling given: Not Answered   Clinical Intake:  Pre-visit preparation completed: Yes  Pain : No/denies pain     Nutritional Risks: None Diabetes: No  How often do you need to have someone help you when you read instructions, pamphlets, or other written materials from your doctor or pharmacy?: 1 - Never  Diabetic?no  Interpreter Needed?: No  Information entered  by :: Kirke Shaggy, LPN   Activities of Daily Living    10/20/2022    2:21 PM  In your present state of health, do you have any difficulty performing the following activities:  Hearing? 1  Vision? 0  Difficulty concentrating or making decisions? 0  Walking or climbing stairs? 1  Dressing or bathing? 0  Doing errands, shopping? 0  Preparing Food and eating ? N  Using the Toilet? N  In the past six months, have you accidently leaked urine? N  Do you have problems with loss of bowel control? N  Managing your Medications? N  Managing your Finances? N  Housekeeping or managing your Housekeeping? N    Patient Care Team: [name], MD as PCP - General (Family Medicine) [name], DO as PCP - Cardiology (Cardiology) Specialists, [place] as Consulting Physician (Orthopedic Surgery) Charlton Haws, [facility] as Pharmacist (Pharmacist)  Indicate any recent Medical Services you may have received from other than [facility] providers in the past year (date may be approximate).     Assessment:   This is a routine wellness examination for Keith Jordan.  Hearing/Vision screen Hearing Screening - Comments:: Wears aids Vision Screening - Comments:: Wears glasses- [facility]  Dietary issues and exercise activities discussed: Current Exercise Habits: Home exercise routine, Type of exercise: walking;strength training/weights, Time (Minutes): 60, Frequency (Times/Week): 3, Weekly Exercise (Minutes/Week): 180, Intensity: Mild   Goals Addressed             This Visit's Progress    DIET - EAT MORE FRUITS AND VEGETABLES         Depression Screen    10/20/2022    2:18 PM 10/13/2021   12:01 PM 10/04/2020    9:45 AM 10/04/2019   10:34 AM 09/26/2018    8:58 AM 09/14/2017    9:21 AM 09/11/2016    3:00 PM  PHQ 2/9 Scores  PHQ - 2 Score 0 0 0 0 0 0 0  PHQ- 9 Score 0  0  0 1     Fall Risk    10/20/2022    2:21 PM 10/13/2021   12:01 PM 10/04/2020  9:45 AM 01/23/2020    10:16 AM 10/04/2019   10:34 AM  Fall Risk   Falls in the past year? 0 0 0 0 0  Number falls in past yr: 0  0 0   Injury with Fall? 0  0 0   Risk for fall due to : No Fall Risks  Medication side effect    Follow up Falls prevention discussed;Falls evaluation completed  Falls evaluation completed;Falls prevention discussed      FALL RISK PREVENTION PERTAINING TO THE HOME:  Any stairs in or around the home? Yes  If so, are there any without handrails? No  Home free of loose throw rugs in walkways, pet beds, electrical cords, etc? Yes  Adequate lighting in your home to reduce risk of falls? Yes   ASSISTIVE DEVICES UTILIZED TO PREVENT FALLS:  Life alert? No  Use of a cane, walker or w/c? Yes  Grab bars in the bathroom? Yes  Shower chair or bench in shower? No  Elevated toilet seat or a handicapped toilet? Yes   Cognitive Function:    10/04/2020    9:48 AM 09/26/2018    8:58 AM 09/14/2017    9:22 AM 09/11/2016    3:15 PM  MMSE - Mini Mental State Exam  Orientation to time '5 5 5 5  '$ Orientation to Place '5 5 5 5  '$ Registration '3 3 3 3  '$ Attention/ Calculation 5 0 0 0  Recall '3 3 3 3  '$ Language- name 2 objects  0 0 0  Language- repeat '1 1 1 1  '$ Language- follow 3 step command  '3 3 3  '$ Language- read & follow direction  0 0 0  Write a sentence  0 0 0  Copy design  0 0 0  Total score  '20 20 20        '$ 10/20/2022    2:22 PM  6CIT Screen  What Year? 0 points  What month? 0 points  What time? 0 points  Count back from 20 0 points  Months in reverse 0 points  Repeat phrase 0 points  Total Score 0 points    Immunizations Immunization History  Administered Date(s) Administered   Fluad Quad(high Dose 65+) 10/04/2019, 10/11/2020, 10/13/2021   Influenza Split 10/30/2013   Influenza,inj,Quad PF,6+ Mos 09/14/2017, 09/26/2018   Influenza-Unspecified 09/01/2016   PFIZER(Purple Top)SARS-COV-2 Vaccination 02/03/2020, 02/24/2020   Pfizer Covid-19 Vaccine Bivalent Booster 58yr & up  11/27/2021   Pneumococcal Conjugate-13 09/08/2016   Pneumococcal Polysaccharide-23 09/14/2017    TDAP status: Due, Education has been provided regarding the importance of this vaccine. Advised may receive this vaccine at local pharmacy or Health Dept. Aware to provide a copy of the vaccination record if obtained from local pharmacy or Health Dept. Verbalized acceptance and understanding.  Flu Vaccine status: Up to date  Pneumococcal vaccine status: Up to date  Covid-19 vaccine status: Completed vaccines  Qualifies for Shingles Vaccine? Yes   Zostavax completed No   Shingrix Completed?: No.    Education has been provided regarding the importance of this vaccine. Patient has been advised to call insurance company to determine out of pocket expense if they have not yet received this vaccine. Advised may also receive vaccine at local pharmacy or Health Dept. Verbalized acceptance and understanding.  Screening Tests Health Maintenance  Topic Date Due   Zoster Vaccines- Shingrix (1 of 2) Never done   FOOT EXAM  03/26/2019   OPHTHALMOLOGY EXAM  07/29/2019   Diabetic kidney evaluation -  Urine ACR  09/27/2019   COVID-19 Vaccine (4 - Pfizer risk series) 01/22/2022   HEMOGLOBIN A1C  04/13/2022   INFLUENZA VACCINE  07/28/2022   TETANUS/TDAP  09/15/2027 (Originally 03/17/1960)   Diabetic kidney evaluation - GFR measurement  09/05/2023   Medicare Annual Wellness (AWV)  11/20/2023   Pneumonia Vaccine 75+ Years old  Completed   HPV VACCINES  Aged Out    Health Maintenance  Health Maintenance Due  Topic Date Due   Zoster Vaccines- Shingrix (1 of 2) Never done   FOOT EXAM  03/26/2019   OPHTHALMOLOGY EXAM  07/29/2019   Diabetic kidney evaluation - Urine ACR  09/27/2019   COVID-19 Vaccine (4 - Pfizer risk series) 01/22/2022   HEMOGLOBIN A1C  04/13/2022   INFLUENZA VACCINE  07/28/2022    Colorectal cancer screening: No longer required.   Lung Cancer Screening: (Low Dose CT Chest  recommended if Age 56-80 years, 30 pack-year currently smoking OR have quit w/in 15years.) does not qualify.   Additional Screening:  Hepatitis C Screening: does not qualify; Completed no  Vision Screening: Recommended annual ophthalmology exams for early detection of glaucoma and other disorders of the eye. Is the patient up to date with their annual eye exam?  Yes  Who is the provider or what is the name of the office in which the patient attends annual eye exams? [place] If pt is not established with a provider, would they like to be referred to a provider to establish care? No .   Dental Screening: Recommended annual dental exams for proper oral hygiene  Community Resource Referral / Chronic Care Management: CRR required this visit?  No   CCM required this visit?  No      Plan:     I have personally reviewed and noted the following in the patient's chart:   Medical and social history Use of alcohol, tobacco or illicit drugs  Current medications and supplements including opioid prescriptions. Patient is not currently taking opioid prescriptions. Functional ability and status Nutritional status Physical activity Advanced directives List of other physicians Hospitalizations, surgeries, and ER visits in previous 12 months Vitals Screenings to include cognitive, depression, and falls Referrals and appointments  In addition, I have reviewed and discussed with patient certain preventive protocols, quality metrics, and best practice recommendations. A written personalized care plan for preventive services as well as general preventive health recommendations were provided to patient.     Dionisio David, LPN   [id number]   Nurse Notes: none

## 2022-10-20 NOTE — Patient Instructions (Signed)
Keith Jordan , Thank you for taking time to come for your Medicare Wellness Visit. I appreciate your ongoing commitment to your health goals. Please review the following plan we discussed and let me know if I can assist you in the future.   Screening recommendations/referrals: Colonoscopy: aged out Recommended yearly ophthalmology/optometry visit for glaucoma screening and checkup Recommended yearly dental visit for hygiene and checkup  Vaccinations: Influenza vaccine: 10/13/21 Pneumococcal vaccine: 09/14/17 Tdap vaccine: n/d Shingles vaccine: n/d   Covid-19: 02/03/20, 02/24/20, 11/27/21  Advanced directives: yes   Conditions/risks identified: none  Next appointment: Follow up in one year for your annual wellness visit. 10/22/23 @ 9:30 am by phone  Preventive Care 65 Years and Older, Male Preventive care refers to lifestyle choices and visits with your health care provider that can promote health and wellness. What does preventive care include? A yearly physical exam. This is also called an annual well check. Dental exams once or twice a year. Routine eye exams. Ask your health care provider how often you should have your eyes checked. Personal lifestyle choices, including: Daily care of your teeth and gums. Regular physical activity. Eating a healthy diet. Avoiding tobacco and drug use. Limiting alcohol use. Practicing safe sex. Taking low doses of aspirin every day. Taking vitamin and mineral supplements as recommended by your health care provider. What happens during an annual well check? The services and screenings done by your health care provider during your annual well check will depend on your age, overall health, lifestyle risk factors, and family history of disease. Counseling  Your health care provider may ask you questions about your: Alcohol use. Tobacco use. Drug use. Emotional well-being. Home and relationship well-being. Sexual activity. Eating habits. History  of falls. Memory and ability to understand (cognition). Work and work Statistician. Screening  You may have the following tests or measurements: Height, weight, and BMI. Blood pressure. Lipid and cholesterol levels. These may be checked every 5 years, or more frequently if you are over 31 years old. Skin check. Lung cancer screening. You may have this screening every year starting at age 43 if you have a 30-pack-year history of smoking and currently smoke or have quit within the past 15 years. Fecal occult blood test (FOBT) of the stool. You may have this test every year starting at age 29. Flexible sigmoidoscopy or colonoscopy. You may have a sigmoidoscopy every 5 years or a colonoscopy every 10 years starting at age 86. Prostate cancer screening. Recommendations will vary depending on your family history and other risks. Hepatitis C blood test. Hepatitis B blood test. Sexually transmitted disease (STD) testing. Diabetes screening. This is done by checking your blood sugar (glucose) after you have not eaten for a while (fasting). You may have this done every 1-3 years. Abdominal aortic aneurysm (AAA) screening. You may need this if you are a current or former smoker. Osteoporosis. You may be screened starting at age 33 if you are at high risk. Talk with your health care provider about your test results, treatment options, and if necessary, the need for more tests. Vaccines  Your health care provider may recommend certain vaccines, such as: Influenza vaccine. This is recommended every year. Tetanus, diphtheria, and acellular pertussis (Tdap, Td) vaccine. You may need a Td booster every 10 years. Zoster vaccine. You may need this after age 39. Pneumococcal 13-valent conjugate (PCV13) vaccine. One dose is recommended after age 63. Pneumococcal polysaccharide (PPSV23) vaccine. One dose is recommended after age 76. Talk to your  health care provider about which screenings and vaccines you need  and how often you need them. This information is not intended to replace advice given to you by your health care provider. Make sure you discuss any questions you have with your health care provider. Document Released: 01/10/2016 Document Revised: 09/02/2016 Document Reviewed: 10/15/2015 Elsevier Interactive Patient Education  2017 [place] Prevention in the Home Falls can cause injuries. They can happen to people of all ages. There are many things you can do to make your home safe and to help prevent falls. What can I do on the outside of my home? Regularly fix the edges of walkways and driveways and fix any cracks. Remove anything that might make you trip as you walk through a door, such as a raised step or threshold. Trim any bushes or trees on the path to your home. Use bright outdoor lighting. Clear any walking paths of anything that might make someone trip, such as rocks or tools. Regularly check to see if handrails are loose or broken. Make sure that both sides of any steps have handrails. Any raised decks and porches should have guardrails on the edges. Have any leaves, snow, or ice cleared regularly. Use sand or salt on walking paths during winter. Clean up any spills in your garage right away. This includes oil or grease spills. What can I do in the bathroom? Use night lights. Install grab bars by the toilet and in the tub and shower. Do not use towel bars as grab bars. Use non-skid mats or decals in the tub or shower. If you need to sit down in the shower, use a plastic, non-slip stool. Keep the floor dry. Clean up any water that spills on the floor as soon as it happens. Remove soap buildup in the tub or shower regularly. Attach bath mats securely with double-sided non-slip rug tape. Do not have throw rugs and other things on the floor that can make you trip. What can I do in the bedroom? Use night lights. Make sure that you have a light by your bed that is easy  to reach. Do not use any sheets or blankets that are too big for your bed. They should not hang down onto the floor. Have a firm chair that has side arms. You can use this for support while you get dressed. Do not have throw rugs and other things on the floor that can make you trip. What can I do in the kitchen? Clean up any spills right away. Avoid walking on wet floors. Keep items that you use a lot in easy-to-reach places. If you need to reach something above you, use a strong step stool that has a grab bar. Keep electrical cords out of the way. Do not use floor polish or wax that makes floors slippery. If you must use wax, use non-skid floor wax. Do not have throw rugs and other things on the floor that can make you trip. What can I do with my stairs? Do not leave any items on the stairs. Make sure that there are handrails on both sides of the stairs and use them. Fix handrails that are broken or loose. Make sure that handrails are as long as the stairways. Check any carpeting to make sure that it is firmly attached to the stairs. Fix any carpet that is loose or worn. Avoid having throw rugs at the top or bottom of the stairs. If you do have throw rugs, attach them  to the floor with carpet tape. Make sure that you have a light switch at the top of the stairs and the bottom of the stairs. If you do not have them, ask someone to add them for you. What else can I do to help prevent falls? Wear shoes that: Do not have high heels. Have rubber bottoms. Are comfortable and fit you well. Are closed at the toe. Do not wear sandals. If you use a stepladder: Make sure that it is fully opened. Do not climb a closed stepladder. Make sure that both sides of the stepladder are locked into place. Ask someone to hold it for you, if possible. Clearly mark and make sure that you can see: Any grab bars or handrails. First and last steps. Where the edge of each step is. Use tools that help you move  around (mobility aids) if they are needed. These include: Canes. Walkers. Scooters. Crutches. Turn on the lights when you go into a dark area. Replace any light bulbs as soon as they burn out. Set up your furniture so you have a clear path. Avoid moving your furniture around. If any of your floors are uneven, fix them. If there are any pets around you, be aware of where they are. Review your medicines with your doctor. Some medicines can make you feel dizzy. This can increase your chance of falling. Ask your doctor what other things that you can do to help prevent falls. This information is not intended to replace advice given to you by your health care provider. Make sure you discuss any questions you have with your health care provider. Document Released: 10/10/2009 Document Revised: 05/21/2016 Document Reviewed: 01/18/2015 Elsevier Interactive Patient Education  2017 [employer].

## 2022-10-21 ENCOUNTER — Other Ambulatory Visit: Payer: Self-pay | Admitting: Family Medicine

## 2022-10-26 ENCOUNTER — Ambulatory Visit (INDEPENDENT_AMBULATORY_CARE_PROVIDER_SITE_OTHER): Payer: Medicare HMO | Admitting: Family Medicine

## 2022-10-26 ENCOUNTER — Encounter: Payer: Self-pay | Admitting: Family Medicine

## 2022-10-26 VITALS — BP 150/72 | HR 58 | Temp 97.6°F | Ht 67.5 in | Wt 149.1 lb

## 2022-10-26 DIAGNOSIS — C8307 Small cell B-cell lymphoma, spleen: Secondary | ICD-10-CM

## 2022-10-26 DIAGNOSIS — Z0000 Encounter for general adult medical examination without abnormal findings: Secondary | ICD-10-CM | POA: Diagnosis not present

## 2022-10-26 DIAGNOSIS — R0689 Other abnormalities of breathing: Secondary | ICD-10-CM

## 2022-10-26 DIAGNOSIS — Z23 Encounter for immunization: Secondary | ICD-10-CM | POA: Diagnosis not present

## 2022-10-26 DIAGNOSIS — I483 Typical atrial flutter: Secondary | ICD-10-CM

## 2022-10-26 DIAGNOSIS — E1169 Type 2 diabetes mellitus with other specified complication: Secondary | ICD-10-CM | POA: Diagnosis not present

## 2022-10-26 DIAGNOSIS — E785 Hyperlipidemia, unspecified: Secondary | ICD-10-CM | POA: Diagnosis not present

## 2022-10-26 NOTE — Assessment & Plan Note (Signed)
Improved on recheck.  CXR from 01/2021 reassuring.

## 2022-10-26 NOTE — Assessment & Plan Note (Signed)
Discussed trend in A1c to 6.7% - now in diabetes range.  Reviewed diet choices to control sugar levels.  Diabetes and nutrition handout provided. rec RTC 6 mo DM f/u visit.

## 2022-10-26 NOTE — Assessment & Plan Note (Signed)
Appreciate oncology care - continue Q6 mo checks.

## 2022-10-26 NOTE — Patient Instructions (Addendum)
Flu shot today  If interested, check with pharmacy about new 2 shot shingles series (shingrix).  Sugar was in diabetes range, diet controlled currently - decrease added sugars and sweetened beverages, watch simple carb portion sizes. We should recheck this in 6 months.  Good to see you today Return in 6 months for diabetes follow up visit.   Diabetes Mellitus and Nutrition, Adult When you have diabetes, or diabetes mellitus, it is very important to have healthy eating habits because your blood sugar (glucose) levels are greatly affected by what you eat and drink. Eating healthy foods in the right amounts, at about the same times every day, can help you: Manage your blood glucose. Lower your risk of heart disease. Improve your blood pressure. Reach or maintain a healthy weight. What can affect my meal plan? Every person with diabetes is different, and each person has different needs for a meal plan. Your health care provider may recommend that you work with a dietitian to make a meal plan that is best for you. Your meal plan may vary depending on factors such as: The calories you need. The medicines you take. Your weight. Your blood glucose, blood pressure, and cholesterol levels. Your activity level. Other health conditions you have, such as heart or kidney disease. How do carbohydrates affect me? Carbohydrates, also called carbs, affect your blood glucose level more than any other type of food. Eating carbs raises the amount of glucose in your blood. It is important to know how many carbs you can safely have in each meal. This is different for every person. Your dietitian can help you calculate how many carbs you should have at each meal and for each snack. How does alcohol affect me? Alcohol can cause a decrease in blood glucose (hypoglycemia), especially if you use insulin or take certain diabetes medicines by mouth. Hypoglycemia can be a life-threatening condition. Symptoms of  hypoglycemia, such as sleepiness, dizziness, and confusion, are similar to symptoms of having too much alcohol. Do not drink alcohol if: Your health care provider tells you not to drink. You are pregnant, may be pregnant, or are planning to become pregnant. If you drink alcohol: Limit how much you have to: 0-1 drink a day for women. 0-2 drinks a day for men. Know how much alcohol is in your drink. In the [place] one drink equals one 12 oz bottle of beer (355 mL), one 5 oz glass of wine (148 mL), or one 1 oz glass of hard liquor (44 mL). Keep yourself hydrated with water, diet soda, or unsweetened iced tea. Keep in mind that regular soda, juice, and other mixers may contain a lot of sugar and must be counted as carbs. What are tips for following this plan?  Reading food labels Start by checking the serving size on the Nutrition Facts label of packaged foods and drinks. The number of calories and the amount of carbs, fats, and other nutrients listed on the label are based on one serving of the item. Many items contain more than one serving per package. Check the total grams (g) of carbs in one serving. Check the number of grams of saturated fats and trans fats in one serving. Choose foods that have a low amount or none of these fats. Check the number of milligrams (mg) of salt (sodium) in one serving. Most people should limit total sodium intake to less than 2,300 mg per day. Always check the nutrition information of foods labeled as "low-fat" or "nonfat." These foods  may be higher in added sugar or refined carbs and should be avoided. Talk to your dietitian to identify your daily goals for nutrients listed on the label. Shopping Avoid buying canned, pre-made, or processed foods. These foods tend to be high in fat, sodium, and added sugar. Shop around the outside edge of the grocery store. This is where you will most often find fresh fruits and vegetables, bulk grains, fresh meats, and fresh dairy  products. Cooking Use low-heat cooking methods, such as baking, instead of high-heat cooking methods, such as deep frying. Cook using healthy oils, such as olive, canola, or sunflower oil. Avoid cooking with butter, cream, or high-fat meats. Meal planning Eat meals and snacks regularly, preferably at the same times every day. Avoid going long periods of time without eating. Eat foods that are high in fiber, such as fresh fruits, vegetables, beans, and whole grains. Eat 4-6 oz (112-168 g) of lean protein each day, such as lean meat, chicken, fish, eggs, or tofu. One ounce (oz) (28 g) of lean protein is equal to: 1 oz (28 g) of meat, chicken, or fish. 1 egg.  cup (62 g) of tofu. Eat some foods each day that contain healthy fats, such as avocado, nuts, seeds, and fish. What foods should I eat? Fruits Berries. Apples. Oranges. Peaches. Apricots. Plums. Grapes. Mangoes. Papayas. Pomegranates. Kiwi. Cherries. Vegetables Leafy greens, including lettuce, spinach, kale, chard, collard greens, mustard greens, and cabbage. Beets. Cauliflower. Broccoli. Carrots. Green beans. Tomatoes. Peppers. Onions. Cucumbers. Brussels sprouts. Grains Whole grains, such as whole-wheat or whole-grain bread, crackers, tortillas, cereal, and pasta. Unsweetened oatmeal. Quinoa. Brown or wild rice. Meats and other proteins Seafood. Poultry without skin. Lean cuts of poultry and beef. Tofu. Nuts. Seeds. Dairy Low-fat or fat-free dairy products such as milk, yogurt, and cheese. The items listed above may not be a complete list of foods and beverages you can eat and drink. Contact a dietitian for more information. What foods should I avoid? Fruits Fruits canned with syrup. Vegetables Canned vegetables. Frozen vegetables with butter or cream sauce. Grains Refined white flour and flour products such as bread, pasta, snack foods, and cereals. Avoid all processed foods. Meats and other proteins Fatty cuts of meat.  Poultry with skin. Breaded or fried meats. Processed meat. Avoid saturated fats. Dairy Full-fat yogurt, cheese, or milk. Beverages Sweetened drinks, such as soda or iced tea. The items listed above may not be a complete list of foods and beverages you should avoid. Contact a dietitian for more information. Questions to ask a health care provider Do I need to meet with a certified diabetes care and education specialist? Do I need to meet with a dietitian? What number can I call if I have questions? When are the best times to check my blood glucose? Where to find more information: American Diabetes Association: diabetes.org Academy of Nutrition and Dietetics: eatright.[employer] of Diabetes and Digestive and Kidney Diseases: [employer]is Association of Diabetes Care & Education Specialists: diabeteseducator.org Summary It is important to have healthy eating habits because your blood sugar (glucose) levels are greatly affected by what you eat and drink. It is important to use alcohol carefully. A healthy meal plan will help you manage your blood glucose and lower your risk of heart disease. Your health care provider may recommend that you work with a dietitian to make a meal plan that is best for you. This information is not intended to replace advice given to you by your health care provider. Make  sure you discuss any questions you have with your health care provider. Document Revised: 07/17/2020 Document Reviewed: 07/17/2020 Elsevier Patient Education  [employer].

## 2022-10-26 NOTE — Assessment & Plan Note (Signed)
Preventative protocols reviewed and updated unless pt declined. Discussed healthy diet and lifestyle.  

## 2022-10-26 NOTE — Assessment & Plan Note (Signed)
LDL at goal on low dose atorvastatin - continue this.  The ASCVD Risk score (Arnett DK, et al., 2019) failed to calculate for the following reasons:   The 2019 ASCVD risk score is only valid for ages 66 to 28

## 2022-10-26 NOTE — Assessment & Plan Note (Signed)
Appreciate cardiology care, continues eliquis and low dose coreg.

## 2022-10-26 NOTE — Progress Notes (Signed)
Patient ID: Keith Jordan, male    DOB: 10-14-1941, 80 y.o.   MRN: [id number]  This visit was conducted in person.  BP (!) 150/72   Pulse (!) 58   Temp 97.6 F (36.4 C) (Temporal)   Ht 5' 7.5" (1.715 m)   Wt 149 lb 2 oz (67.6 kg)   SpO2 96%   BMI 23.01 kg/m   BP Readings from Last 3 Encounters:  10/26/22 (!) 150/72  09/04/22 (!) 143/60  06/08/22 126/64    CC: CPE Subjective:   HPI: Keith Jordan is a 81 y.o. male presenting on 10/26/2022 for Annual Exam (MCR prt 2.)   Saw health advisor last week for medicare wellness visit. Note reviewed.  No results found.  Flowsheet Row Clinical Support from 10/20/2022 in [place] at [place]  PHQ-2 Total Score 0          10/20/2022    2:21 PM 10/13/2021   12:01 PM 10/04/2020    9:45 AM 01/23/2020   10:16 AM 10/04/2019   10:34 AM  Fall Risk   Falls in the past year? 0 0 0 0 0  Number falls in past yr: 0  0 0   Injury with Fall? 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 Gets labs Q3 months. WBC baseline 130-140k.    Typical Aflutter found on Zio monitor use s/p successful ablation 06/2021 followed by cardiologist Dr [name] and EP Dr Curt Bears. Now on Eliquis and carvedilol twice daily.   2 months ago noted some left leg stiffness followed by sore bump to left lower back - this also improved. No inciting trauma/injury or falls.   Preventative: COLONOSCOPY Date: 12/2013 no polyps, no rpt due [name]) Prostate cancer screening - h/o BPH. Nocturia x1-2. Denies trouble with stream. Discussed, will stop screening.  Lung cancer screening - never smoker  Flu shot yearly [place] 01/2020 x2, booster 11/2021 Tetanus shot unsure  Prevnar-13  2017, pneumovax 2018 Shingrix - discussed - to check at pharmacy Advanced directive discussion - scanned 2022. Son Keith Jordan is HCPOA then Keith Jordan and [employer] . Grants discretion to [employer] regarding life prolonging measures. Would want to be full code but wouldn't want prolonged life support if terminal condition. Doesn't know about feeding tube, leaning against this.  Seat belt use discussed  Sunscreen use discussed, no changing moles on skin  Non smoker  Alcohol use - none  Dentist Q53mo Eye exam yearly  Bowel - no constipation  Bladder - no incontinence  Lives with wife, 2 cats  Occ: retired pPersonal assistant Activity: works in yard, new stationary bicycle  Diet: some water, likes flavored sparkling water, fruits/vegetables daily      Relevant past medical, surgical, family and social history reviewed and updated as indicated. Interim medical history since our last visit reviewed. Allergies and medications reviewed and updated. Outpatient Medications Prior to Visit  Medication Sig Dispense Refill   acetaminophen (TYLENOL) 500 MG tablet Take 500 mg by mouth every 6 (six) hours as needed for mild pain.     Ascorbic Acid (VITAMIN C) 1000 MG tablet Take 500 mg by mouth 2 (two) times daily.  atorvastatin (LIPITOR) 10 MG tablet TAKE 1 TABLET BY MOUTH EVERY DAY 90 tablet 3   carboxymethylcellulose (REFRESH PLUS) 0.5 % SOLN Place 1 drop into both eyes 2 (two) times daily as needed (dry eyes).     carvedilol (COREG) 6.25 MG tablet Take 6.25 mg by mouth 2 (two) times daily.     ELIQUIS 5 MG TABS tablet TAKE 1 TABLET BY MOUTH TWICE A DAY 60 tablet 5   Multiple Vitamins-Minerals (MULTIVITAMIN WITH MINERALS) tablet Take 1 tablet by mouth daily. Unknown strenght     Multiple Vitamins-Minerals (PRESERVISION AREDS) CAPS Take 1 capsule by mouth in the morning and at bedtime. (Patient taking differently: Take 1 capsule by mouth in the morning and at bedtime. Unknown strenght) 180 capsule 3    Olopatadine HCl 0.2 % SOLN Apply 1 drop to eye daily. (Both) 2.5 mL 0   sodium chloride (OCEAN) 0.65 % SOLN nasal spray Place 1 spray into both nostrils as needed for congestion.     triamcinolone (NASACORT) 55 MCG/ACT AERO nasal inhaler Place 1 spray into the nose daily as needed (sinus congestion). 1 each    carvedilol (COREG) 3.125 MG tablet Take 1 tablet (3.125 mg total) by mouth 2 (two) times daily. (Patient taking differently: Take 6.25 mg by mouth 2 (two) times daily.) 180 tablet 3   No facility-administered medications prior to visit.     Per HPI unless specifically indicated in ROS section below Review of Systems  Constitutional:  Negative for activity change, appetite change, chills, fatigue, fever and unexpected weight change.  HENT:  Negative for hearing loss.   Eyes:  Negative for visual disturbance.  Respiratory:  Negative for cough, chest tightness, shortness of breath and wheezing.   Cardiovascular:  Negative for chest pain, palpitations and leg swelling.  Gastrointestinal:  Negative for abdominal distention, abdominal pain, blood in stool, constipation, diarrhea, nausea and vomiting.  Genitourinary:  Negative for difficulty urinating and hematuria.  Musculoskeletal:  Negative for arthralgias, myalgias and neck pain.  Skin:  Negative for rash.  Neurological:  Negative for dizziness, seizures, syncope and headaches.  Hematological:  Negative for adenopathy. Bruises/bleeds easily.  Psychiatric/Behavioral:  Negative for dysphoric mood. The patient is not nervous/anxious.     Objective:  BP (!) 150/72   Pulse (!) 58   Temp 97.6 F (36.4 C) (Temporal)   Ht 5' 7.5" (1.715 m)   Wt 149 lb 2 oz (67.6 kg)   SpO2 96%   BMI 23.01 kg/m   Wt Readings from Last 3 Encounters:  10/26/22 149 lb 2 oz (67.6 kg)  10/20/22 150 lb (68 kg)  09/04/22 150 lb 3.2 oz (68.1 kg)      Physical Exam Vitals and nursing note reviewed.  Constitutional:      General: He is not in acute  distress.    Appearance: Normal appearance. He is well-developed. He is not ill-appearing.  HENT:     Head: Normocephalic and atraumatic.     Right Ear: Hearing, tympanic membrane, ear canal and external ear normal. There is no impacted cerumen.     Left Ear: Hearing, tympanic membrane, ear canal and external ear normal. There is no impacted cerumen.     Ears:     Comments: Wearing hearing aide to left ear     Nose: Nose normal. No congestion.     Mouth/Throat:     Mouth: Mucous membranes are moist.     Pharynx: Oropharynx is clear. No oropharyngeal exudate or posterior oropharyngeal erythema.  Eyes:     General: No scleral icterus.    Extraocular Movements: Extraocular movements intact.     Conjunctiva/sclera: Conjunctivae normal.     Pupils: Pupils are equal, round, and reactive to light.  Neck:     Thyroid: No thyroid mass or thyromegaly.     Vascular: No carotid bruit.  Cardiovascular:     Rate and Rhythm: Normal rate and regular rhythm.     Pulses: Normal pulses.          Radial pulses are 2+ on the right side and 2+ on the left side.     Heart sounds: Normal heart sounds. No murmur heard. Pulmonary:     Effort: Pulmonary effort is normal. No respiratory distress.     Breath sounds: Normal breath sounds. No wheezing, rhonchi or rales.  Abdominal:     General: Bowel sounds are normal. There is no distension.     Palpations: Abdomen is soft. There is no mass.     Tenderness: There is no abdominal tenderness. There is no guarding or rebound.     Hernia: No hernia is present.  Musculoskeletal:        General: Normal range of motion.     Cervical back: Normal range of motion and neck supple.     Right lower leg: No edema.     Left lower leg: No edema.  Lymphadenopathy:     Cervical: No cervical adenopathy.  Skin:    General: Skin is warm and dry.     Findings: No rash.  Neurological:     General: No focal deficit present.     Mental Status: He is alert and oriented to  person, place, and time.  Psychiatric:        Mood and Affect: Mood normal.        Behavior: Behavior normal.        Thought Content: Thought content normal.        Judgment: Judgment normal.       Results for orders placed or performed in visit on 10/20/22  Magnesium  Result Value Ref Range   Magnesium 2.1 1.5 - 2.5 mg/dL  Lipid panel  Result Value Ref Range   Cholesterol 120 0 - 200 mg/dL   Triglycerides 90.0 0.0 - 149.0 mg/dL   HDL 29.90 (L) >39.00 mg/dL   VLDL 18.0 0.0 - 40.0 mg/dL   LDL Cholesterol 72 0 - 99 mg/dL   Total CHOL/HDL Ratio 4    NonHDL 89.74   Comprehensive metabolic panel  Result Value Ref Range   Sodium 142 135 - 145 mEq/L   Potassium 3.7 3.5 - 5.1 mEq/L   Chloride 103 96 - 112 mEq/L   CO2 32 19 - 32 mEq/L   Glucose, Bld 111 (H) 70 - 99 mg/dL   BUN 26 (H) 6 - 23 mg/dL   Creatinine, Ser 1.08 0.40 - 1.50 mg/dL   Total Bilirubin 0.7 0.2 - 1.2 mg/dL   Alkaline Phosphatase 73 39 - 117 U/L   AST 24 0 - 37 U/L   ALT 10 0 - 53 U/L   Total Protein 6.2 6.0 - 8.3 g/dL   Albumin 4.0 3.5 - 5.2 g/dL   GFR 64.77 >60.00 mL/min   Calcium 8.9 8.4 - 10.5 mg/dL  Hemoglobin A1c  Result Value Ref Range   Hgb A1c MFr Bld 6.7 (H) 4.6 - 6.5 %    Assessment & Plan:   Problem List Items Addressed This Visit  Health maintenance examination - Primary (Chronic)    Preventative protocols reviewed and updated unless pt declined. Discussed healthy diet and lifestyle.       Hyperlipidemia associated with type 2 diabetes mellitus (HCC)    LDL at goal on low dose atorvastatin - continue this.  The ASCVD Risk score (Arnett DK, et al., 2019) failed to calculate for the following reasons:   The 2019 ASCVD risk score is only valid for ages 42 to 39       Relevant Medications   carvedilol (COREG) 6.25 MG tablet   Type 2 diabetes mellitus with other specified complication (HCC)    Discussed trend in A1c to 6.7% - now in diabetes range.  Reviewed diet choices to control  sugar levels.  Diabetes and nutrition handout provided. rec RTC 6 mo DM f/u visit.       Abnormal breath sounds    Improved on recheck.  CXR from 01/2021 reassuring.       Splenic marginal zone b-cell lymphoma [facility])    Appreciate oncology care - continue Q6 mo checks.       Typical atrial flutter [facility])    Appreciate cardiology care, continues eliquis and low dose coreg.       Relevant Medications   carvedilol (COREG) 6.25 MG tablet   Other Visit Diagnoses     Need for influenza vaccination       Relevant Orders   Flu Vaccine QUAD High Dose(Fluad) (Completed)        No orders of the defined types were placed in this encounter.  Orders Placed This Encounter  Procedures   Flu Vaccine QUAD High Dose(Fluad)    Patient instructions: Flu shot today  If interested, check with pharmacy about new 2 shot shingles series (shingrix).  Sugar was in diabetes range, diet controlled currently - decrease added sugars and sweetened beverages, watch simple carb portion sizes. We should recheck this in 6 months.  Good to see you today Return in 6 months for diabetes follow up visit.   Follow up plan: Return in about 6 months (around 04/27/2023) for follow up visit.  [name], MD

## 2022-11-14 IMAGING — DX DG CHEST 2V
2 series · 2 of 2 positions shown · non-contrast
Comparison: Chest x-ray dated October 11, 2020.

CLINICAL DATA: Chest congestion and palpitations.

EXAM:
CHEST - 2 VIEW

[chest pa]
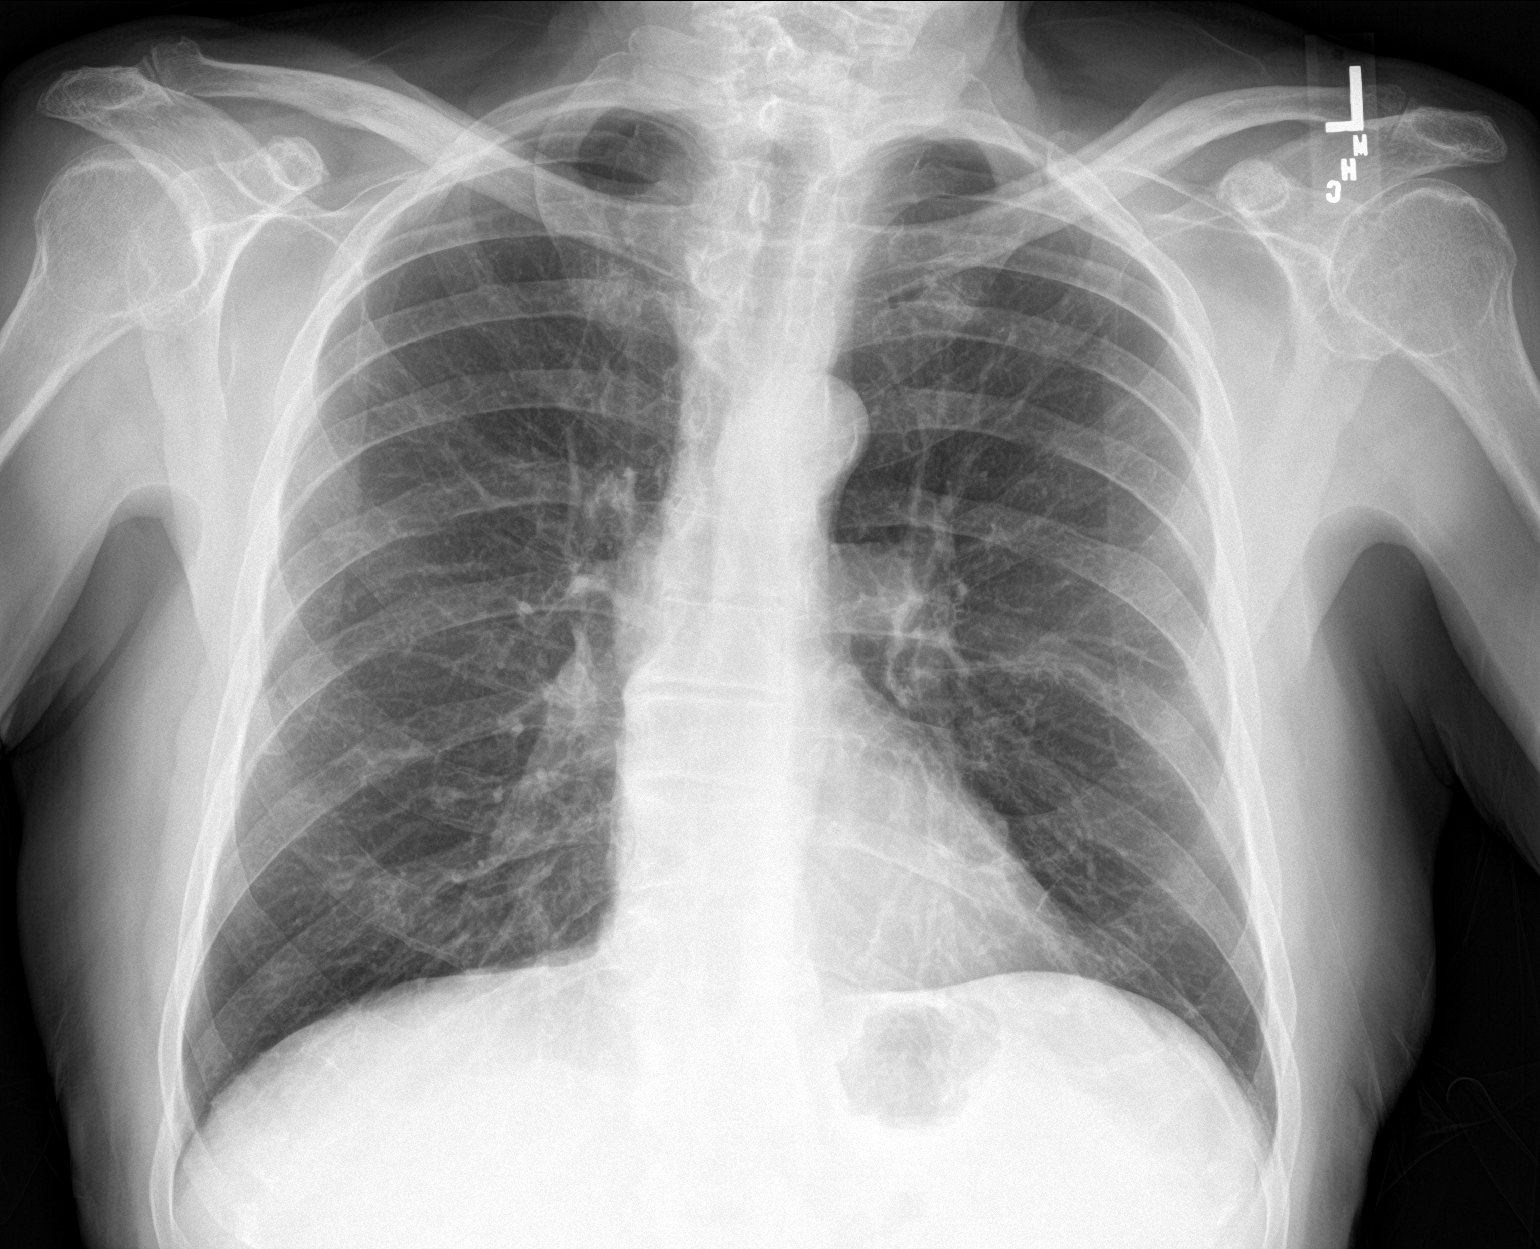

[chest lat]
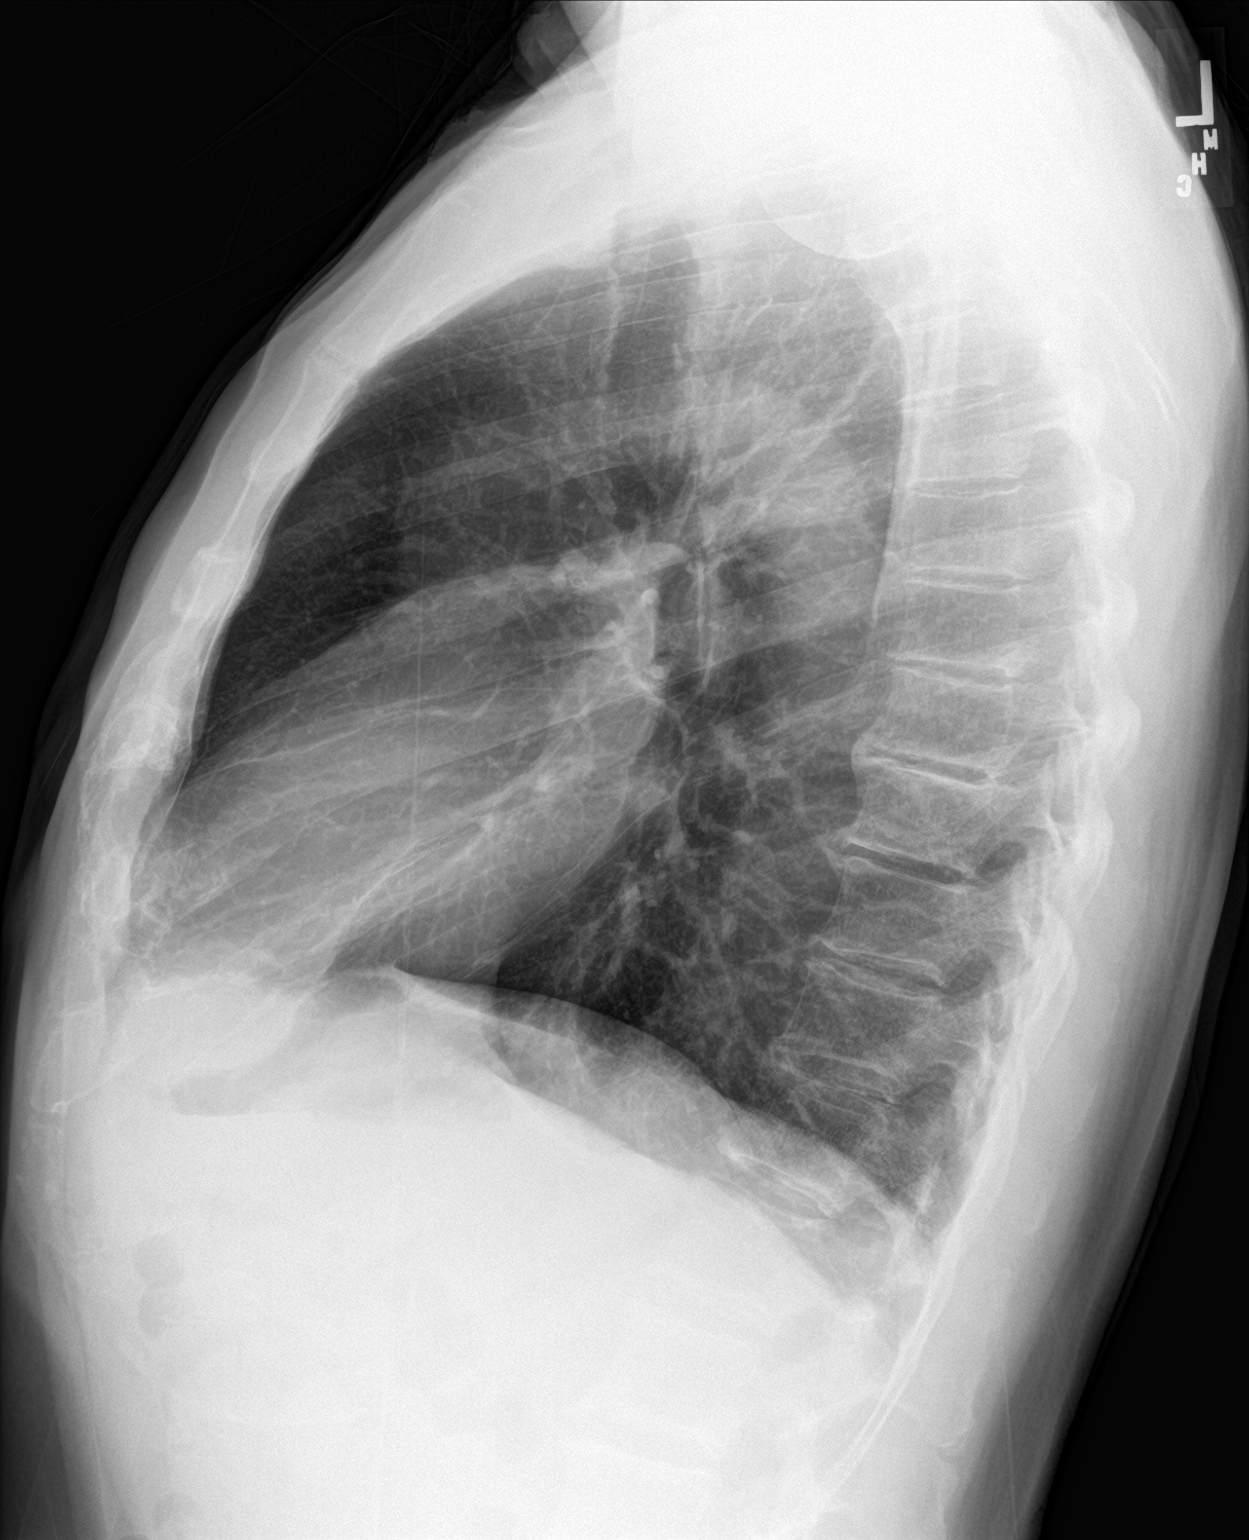

[2 of 2 positions shown; findings below may reference images not displayed]

FINDINGS: The heart size and mediastinal contours are within normal limits.
Both lungs are clear. The visualized skeletal structures are
unremarkable.
IMPRESSION: No active cardiopulmonary disease.

## 2022-12-02 DIAGNOSIS — H353132 Nonexudative age-related macular degeneration, bilateral, intermediate dry stage: Secondary | ICD-10-CM | POA: Diagnosis not present

## 2022-12-04 ENCOUNTER — Inpatient Hospital Stay: Payer: Medicare HMO | Attending: Hematology and Oncology

## 2022-12-04 ENCOUNTER — Other Ambulatory Visit: Payer: Self-pay

## 2022-12-04 ENCOUNTER — Telehealth: Payer: Self-pay | Admitting: *Deleted

## 2022-12-04 DIAGNOSIS — C8307 Small cell B-cell lymphoma, spleen: Secondary | ICD-10-CM | POA: Insufficient documentation

## 2022-12-04 LAB — CMP (CANCER CENTER ONLY)
ALT: 12 U/L (ref 0–44)
AST: 25 U/L (ref 15–41)
Albumin: 3.9 g/dL (ref 3.5–5.0)
Alkaline Phosphatase: 79 U/L (ref 38–126)
Anion gap: 3 — ABNORMAL LOW (ref 5–15)
BUN: 29 mg/dL — ABNORMAL HIGH (ref 8–23)
CO2: 34 mmol/L — ABNORMAL HIGH (ref 22–32)
Calcium: 9.4 mg/dL (ref 8.9–10.3)
Chloride: 104 mmol/L (ref 98–111)
Creatinine: 0.97 mg/dL (ref 0.61–1.24)
GFR, Estimated: >60 mL/min (ref >60)
Glucose, Bld: 196 mg/dL — ABNORMAL HIGH (ref 70–99)
Potassium: 3.9 mmol/L (ref 3.5–5.1)
Sodium: 141 mmol/L (ref 135–145)
Total Bilirubin: 0.6 mg/dL (ref 0.3–1.2)
Total Protein: 6.2 g/dL — ABNORMAL LOW (ref 6.5–8.1)

## 2022-12-04 LAB — CBC WITH DIFFERENTIAL (CANCER CENTER ONLY)
Abs Immature Granulocytes: 0.4 10*3/uL — ABNORMAL HIGH (ref 0.00–0.07)
Basophils Absolute: 0.1 10*3/uL (ref 0.0–0.1)
Basophils Relative: 0 %
Eosinophils Absolute: 0.6 10*3/uL — ABNORMAL HIGH (ref 0.0–0.5)
Eosinophils Relative: 0 %
HCT: 37 % — ABNORMAL LOW (ref 39.0–52.0)
Hemoglobin: 12 g/dL — ABNORMAL LOW (ref 13.0–17.0)
Immature Granulocytes: 0 %
Lymphocytes Relative: 95 %
Lymphs Abs: 186.5 10*3/uL — ABNORMAL HIGH (ref 0.7–4.0)
MCH: 31.4 pg (ref 26.0–34.0)
MCHC: 32.4 g/dL (ref 30.0–36.0)
MCV: 96.9 fL (ref 80.0–100.0)
Monocytes Absolute: 5.5 10*3/uL — ABNORMAL HIGH (ref 0.1–1.0)
Monocytes Relative: 3 %
Neutro Abs: 3.4 10*3/uL (ref 1.7–7.7)
Neutrophils Relative %: 2 %
Platelet Count: 106 10*3/uL — ABNORMAL LOW (ref 150–400)
RBC: 3.82 MIL/uL — ABNORMAL LOW (ref 4.22–5.81)
RDW: 15.1 % (ref 11.5–15.5)
Smear Review: NORMAL
WBC Count: 196.5 10*3/uL (ref 4.0–10.5)
nRBC: 0 % (ref 0.0–0.2)

## 2022-12-04 LAB — LACTATE DEHYDROGENASE: LDH: 146 U/L (ref 98–192)

## 2022-12-04 NOTE — Telephone Encounter (Signed)
CRITICAL VALUE STICKER  CRITICAL VALUE: WBC 196.5  RECEIVER (on-site recipient of call): Drucie Ip, RN   DATE & TIME NOTIFIED: 10:50 am 12/04/22  MESSENGER (representative from lab): Lelan Pons  MD NOTIFIED: Dede Query, PA  TIME OF NOTIFICATION: 12noon  RESPONSE:  acknowledged receipt

## 2022-12-08 ENCOUNTER — Telehealth: Payer: Self-pay | Admitting: Cardiology

## 2022-12-08 NOTE — Telephone Encounter (Signed)
Pt c/o medication issue:  1. Name of Medication:  carvedilol (COREG) 6.25 MG tablet  2. How are you currently taking this medication (dosage and times per day)?  Twice daily, as prescribed  3. Are you having a reaction (difficulty breathing--STAT)?   4. What is your medication issue?   Patient states this medication is causing his HR to increase and he is waking up several times during the night.

## 2022-12-08 NOTE — Telephone Encounter (Signed)
Returned call to patient who states that he has still been having issues with hearing his heart beating at night and feeling like his heart is beating harder. Patient denies any chest pain, lightheadedness or shortness of breath. Patient denies any increase in heart rate but just feels like he is more aware and this wakes him up at night. Patient wanted to pass message along to Dr. Harriet Masson in preparation for [facility] appointment on 12/18. Advised patient I would forward message to her. Patient verbalized understanding.

## 2022-12-14 ENCOUNTER — Encounter: Payer: Self-pay | Admitting: Cardiology

## 2022-12-14 ENCOUNTER — Ambulatory Visit: Payer: Medicare HMO | Attending: Cardiology | Admitting: Cardiology

## 2022-12-14 ENCOUNTER — Ambulatory Visit (INDEPENDENT_AMBULATORY_CARE_PROVIDER_SITE_OTHER): Payer: Medicare HMO

## 2022-12-14 VITALS — BP 136/74 | HR 57 | Ht 68.5 in | Wt 144.8 lb

## 2022-12-14 DIAGNOSIS — I483 Typical atrial flutter: Secondary | ICD-10-CM

## 2022-12-14 DIAGNOSIS — R002 Palpitations: Secondary | ICD-10-CM | POA: Diagnosis not present

## 2022-12-14 DIAGNOSIS — E1169 Type 2 diabetes mellitus with other specified complication: Secondary | ICD-10-CM | POA: Diagnosis not present

## 2022-12-14 DIAGNOSIS — I10 Essential (primary) hypertension: Secondary | ICD-10-CM

## 2022-12-14 DIAGNOSIS — E782 Mixed hyperlipidemia: Secondary | ICD-10-CM

## 2022-12-14 NOTE — Progress Notes (Signed)
Cardiology Office Note:    Date:  12/14/2022   ID:  Keith Jordan, DOB 01-Jul-1941, MRN [id number]  PCP:  [name], MD  Cardiologist:  [name], DO  Electrophysiologist:  None   Referring MD: [name], MD   " I am doing well"  History of Present Illness:    Keith Jordan is a 81 y.o. 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   I saw the patient on 04/17/2021 and at that time he reported intermittent palpitations, therefore a zio live monitor was placed on the patient. Two days of wearing the monitor, atrial flutter was noted and we started the patient on anticoagulation with Eliquis ( CHADS2 VASc score of 2). He was also started on Cardizem 120 mg daily.   I last saw the patient on August 02, 2019.  At that time we continued his Cardizem.  Since I saw the patient he also had seen EP.  He had maintained sinus rhythm.  Eliquis was stopped.  I saw the patient on 02/03/2022 at that time he had not tolerated the metoprolol so I stopped the metoprolol and started low-dose carvedilol.  He also had not tolerated Cardizem.  At his last visit he was experiencing leg cramps and wanted to go on a lower dose of his carvedilol so we did.  We cut him back to 3.25 mg twice a day. Today he is experiencing intermittent palpitations.  He is concerned.  Past Medical History:  Diagnosis Date   Abnormal breath sounds 10/11/2020   Acute sinusitis 01/29/2021   Advanced care planning/counseling discussion 09/01/2016   BPH (benign prostatic hyperplasia) 09/29/2018   Ear fullness, bilateral 04/03/2021   Health maintenance examination 09/01/2016   Hyperlipidemia    Irregular heart beat 03/24/2021   Ischemic optic neuropathy 2006   [place] [facility]   Leukocytosis 01/30/2021   Severe s/p ER eval 01/2021   Medicare annual wellness visit, subsequent 10/04/2019   Nausea  without vomiting 03/24/2021   Personal history of colonic adenoma 07/30/2008   Prediabetes 07/05/2016   A1c 6.5% 03/2014    Prediabetes 07/05/2016   A1c 6.5% 03/2014    Primary osteoarthritis of knee    bilateral s/p L knee replacement, receives R knee injections [name], [name])   Sebaceous cyst 01/30/2020   Splenic marginal zone b-cell lymphoma ([place]) 02/05/2021    Past Surgical History:  Procedure Laterality Date   A-FLUTTER ABLATION N/A 07/11/2021   Procedure: A-FLUTTER ABLATION;  Surgeon: [name], MD;  Location: [facility];  Service: Cardiovascular;  Laterality: N/A;   COLONOSCOPY  12/2013   no polyps, no rpt due [name])   CYST EXCISION  01/2020   TOTAL KNEE ARTHROPLASTY Left 2005   [name]    Current Medications: Current Meds  Medication Sig   acetaminophen (TYLENOL) 500 MG tablet Take 500 mg by mouth every 6 (six) hours as needed for mild pain.   Ascorbic Acid (VITAMIN C) 1000 MG tablet Take 500 mg by mouth 2 (two) times daily.   atorvastatin (LIPITOR) 10 MG tablet TAKE 1 TABLET BY MOUTH EVERY DAY   carboxymethylcellulose (REFRESH PLUS) 0.5 % SOLN Place 1 drop into both eyes 2 (two) times daily as needed (dry eyes).   carvedilol (COREG) 6.25 MG tablet Take 6.25 mg by mouth 2 (two) times daily.   ELIQUIS 5 MG TABS tablet TAKE 1 TABLET BY MOUTH  TWICE A DAY   Multiple Vitamins-Minerals (MULTIVITAMIN WITH MINERALS) tablet Take 1 tablet by mouth daily. Unknown strenght   Multiple Vitamins-Minerals (PRESERVISION AREDS) CAPS Take 1 capsule by mouth in the morning and at bedtime. (Patient taking differently: Take 1 capsule by mouth in the morning and at bedtime. Unknown strenght)   Olopatadine HCl 0.2 % SOLN Apply 1 drop to eye daily. (Both)   sodium chloride (OCEAN) 0.65 % SOLN nasal spray Place 1 spray into both nostrils as needed for congestion.   triamcinolone (NASACORT) 55 MCG/ACT AERO nasal inhaler Place 1 spray into the nose daily as needed (sinus congestion).      Allergies:   Patient has no known allergies.   Social History   Socioeconomic History   Marital status: Married    Spouse name: Not on file   Number of children: Not on file   Years of education: Not on file   Highest education level: Not on file  Occupational History   Not on file  Tobacco Use   Smoking status: Never   Smokeless tobacco: Never  Vaping Use   Vaping Use: Never used  Substance and Sexual Activity   Alcohol use: Yes    Comment: occasional   Drug use: No   Sexual activity: Not Currently  Other Topics Concern   Not on file  Social History Narrative   Lives with wife, 2 cats   Occ: retired Personal assistant   Activity: works in yard   Diet: some water, fruits/vegetables daily   Social Determinants of Radio broadcast assistant Strain: [place]  (10/20/2022)   Overall Financial Resource Strain (CARDIA)    Difficulty of Paying Living Expenses: Not hard at all  Food Insecurity: No Food Insecurity (10/20/2022)   Hunger Vital Sign    Worried About Running Out of Food in the Last Year: Never true    [place] in the Last Year: Never true  Transportation Needs: No Transportation Needs (10/20/2022)   PRAPARE - Hydrologist (Medical): No    Lack of Transportation (Non-Medical): No  Physical Activity: Sufficiently Active (10/20/2022)   Exercise Vital Sign    Days of Exercise per Week: 3 days    Minutes of Exercise per Session: 60 min  Stress: No Stress Concern Present (10/20/2022)   [place]    Feeling of Stress : Not at all  Social Connections: Moderately Isolated (10/20/2022)   Social Connection and Isolation Panel [NHANES]    Frequency of Communication with Friends and Family: Twice a week    Frequency of Social Gatherings with Friends and Family: Once a week    Attends Religious Services: Never    Marine scientist or Organizations: No    Attends Programme researcher, broadcasting/film/video: Never    Marital Status: Married     Family History: The patient's family history includes Arthritis in his father; Blindness (age of onset: 90) in his mother; Lung disease in his father. There is no history of Colon cancer, Pancreatic cancer, Stomach cancer, Esophageal cancer, Rectal cancer, Cancer, Diabetes, CAD, or Stroke.  ROS:   Review of Systems  Constitution: Negative for decreased appetite, fever and weight gain.  HENT: Negative for congestion, ear discharge, hoarse voice and sore throat.   Eyes: Negative for discharge, redness, vision loss in right eye and visual halos.  Cardiovascular: Negative for chest pain, dyspnea on exertion, leg swelling, orthopnea and palpitations.  Respiratory: Negative for cough, hemoptysis, shortness of breath and snoring.   Endocrine: Negative for heat intolerance and polyphagia.  Hematologic/Lymphatic: Negative for bleeding problem. Does not bruise/bleed easily.  Skin: Negative for flushing, nail changes, rash and suspicious lesions.  Musculoskeletal: Negative for arthritis, joint pain, muscle cramps, myalgias, neck pain and stiffness.  Gastrointestinal: Negative for abdominal pain, bowel incontinence, diarrhea and excessive appetite.  Genitourinary: Negative for decreased libido, genital sores and incomplete emptying.  Neurological: Negative for brief paralysis, focal weakness, headaches and loss of balance.  Psychiatric/Behavioral: Negative for altered mental status, depression and suicidal ideas.  Allergic/Immunologic: Negative for HIV exposure and persistent infections.    EKGs/Labs/Other Studies Reviewed:    The following studies were reviewed today:   EKG: Sinus bradycardia with heart rate 57 bpm, with underlying first-degree AV block.  TTE 2022 IMPRESSIONS  1. Left ventricular ejection fraction, by estimation, is 70 to 75%. The  left ventricle has hyperdynamic function. The left ventricle has no  regional wall  motion abnormalities. Left ventricular diastolic parameters  are consistent with Grade I diastolic  dysfunction (impaired relaxation).   2. Right ventricular systolic function is normal. The right ventricular  size is mildly enlarged. There is mildly elevated pulmonary artery  systolic pressure. The estimated right ventricular systolic pressure is  18.5 mmHg.   3. The mitral valve is normal in structure. No evidence of mitral valve  regurgitation. No evidence of mitral stenosis.   4. The aortic valve is normal in structure. Aortic valve regurgitation is  not visualized. No aortic stenosis is present.   5. The inferior vena cava is normal in size with greater than 50%  respiratory variability, suggesting right atrial pressure of 3 mmHg.   FINDINGS   Left Ventricle: Left ventricular ejection fraction, by estimation, is 70  to 75%. The left ventricle has hyperdynamic function. The left ventricle  has no regional wall motion abnormalities. The left ventricular internal  cavity size was normal in size.  There is no left ventricular hypertrophy. Left ventricular diastolic  parameters are consistent with Grade I diastolic dysfunction (impaired  relaxation).   Right Ventricle: The right ventricular size is mildly enlarged. No  increase in right ventricular wall thickness. Right ventricular systolic  function is normal. There is mildly elevated pulmonary artery systolic  pressure. The tricuspid regurgitant  velocity is 2.89 m/s, and with an assumed right atrial pressure of 3 mmHg,  the estimated right ventricular systolic pressure is 63.1 mmHg.   Left Atrium: Left atrial size was normal in size.   Right Atrium: Right atrial size was normal in size.   Pericardium: There is no evidence of pericardial effusion.   Mitral Valve: The mitral valve is normal in structure. No evidence of  mitral valve regurgitation. No evidence of mitral valve stenosis.   Tricuspid Valve: The tricuspid valve is  normal in structure. Tricuspid  valve regurgitation is mild . No evidence of tricuspid stenosis.   Aortic Valve: The aortic valve is normal in structure. Aortic valve  regurgitation is not visualized. No aortic stenosis is present.   Pulmonic Valve: The pulmonic valve was normal in structure. Pulmonic valve  regurgitation is not visualized. No evidence of pulmonic stenosis.   Aorta: The aortic root is normal in size and structure.   Venous: The inferior vena cava is normal in size with greater than 50%  respiratory variability, suggesting right atrial pressure of 3 mmHg.   IAS/Shunts: No atrial level shunt detected by color flow Doppler.  Zio monitor The patient wore the monitor for 13 days, 21 hours starting April 17, 2021.   Indication: Palpitations   The minimum heart rate was 46 bpm, maximum heart rate was 149 bpm, and average heart rate was 68 bpm. Predominant underlying rhythm was Sinus Rhythm.   3 Supraventricular Tachycardia runs occurred, the run with the fastest interval lasting 4 beats with a max rate of 126 bpm, the longest lasting 7 beats with an avg rate of 103 bpm.     Atrial Flutter occurred (3% burden), ranging from 46-149 bpm (avg of 82 bpm), the longest lasting 1 hour 41 mins with an avg rate of 92 bpm.   Premature atrial complexes were occasional (1.0%, 13493). Premature Ventricular complexes were rare less than 1%. No ventricular tachycardia, no pauses present.   Symptoms are associated with atrial flutter, sinus rhythm and premature atrial complexes.       Conclusion: This study is remarkable for the following:                       1.  Symptomatic atrial flutter                       2.  Occasional premature atrial complexes.    Recent Labs: 10/20/2022: Magnesium 2.1 12/04/2022: ALT 12; BUN 29; Creatinine 0.97; Hemoglobin 12.0; Platelet Count 106; Potassium 3.9; Sodium 141  Recent Lipid Panel    Component Value Date/Time   CHOL 120 10/20/2022  0936   CHOL 140 04/16/2015 0000   CHOL 140 04/16/2015 0000   TRIG 90.0 10/20/2022 0936   TRIG 94 04/16/2015 0000   TRIG 94 04/16/2015 0000   HDL 29.90 (L) 10/20/2022 0936   CHOLHDL 4 10/20/2022 0936   VLDL 18.0 10/20/2022 0936   LDLCALC 72 10/20/2022 0936   LDLCALC 86 04/16/2015 0000   LDLCALC 86 04/16/2015 0000    Physical Exam:    VS:  BP 136/74   Pulse (!) 57   Ht 5' 8.5" (1.74 m)   Wt 144 lb 12.8 oz (65.7 kg)   SpO2 97%   BMI 21.70 kg/m     Wt Readings from Last 3 Encounters:  12/14/22 144 lb 12.8 oz (65.7 kg)  10/26/22 149 lb 2 oz (67.6 kg)  10/20/22 150 lb (68 kg)     GEN: Well nourished, well developed in no acute distress HEENT: Normal NECK: No JVD; No carotid bruits LYMPHATICS: No lymphadenopathy CARDIAC: S1S2 noted,RRR, no murmurs, rubs, gallops RESPIRATORY:  Clear to auscultation without rales, wheezing or rhonchi  ABDOMEN: Soft, non-tender, non-distended, +bowel sounds, no guarding. EXTREMITIES: No edema, No cyanosis, no clubbing MUSCULOSKELETAL:  No deformity  SKIN: Warm and dry NEUROLOGIC:  Alert and oriented x 3, non-focal PSYCHIATRIC:  Normal affect, good insight  ASSESSMENT:    1. Typical atrial flutter (HCC)   2. Palpitations   3. Type 2 diabetes mellitus with other specified complication, without long-term current use of insulin ([place])   4. Hypertension, unspecified type   5. Mixed hyperlipidemia     PLAN:    He is having intermittent bursts of palpitations.  He has questions about use of other medications.  He is currently on Coreg.  He had not been able to tolerate Toprol XL as well as Cardizem.  I spoke to the patient I would really like to place a monitor him to understand if he is going into atrial arrhythmias.  If monitor showed that he is  going to A-fib a flutter I am going to start him on flecainide.  Continue his anticoagulation Eliquis 5 mg twice daily.  The patient is in agreement with the above plan. The patient left the  office in stable condition.  The patient will follow up in 6 months or sooner if needed.   Medication Adjustments/Labs and Tests Ordered: Current medicines are reviewed at length with the patient today.  Concerns regarding medicines are outlined above.  Orders Placed This Encounter  Procedures   LONG TERM MONITOR (3-14 DAYS)   EKG 12-Lead   No orders of the defined types were placed in this encounter.   Patient Instructions  Medication Instructions:  Your physician recommends that you continue on your current medications as directed. Please refer to the Current Medication list given to you today.  *If you need a refill on your cardiac medications before your next appointment, please call your pharmacy*   Lab Work: NONE If you have labs (blood work) drawn today and your tests are completely normal, you will receive your results only by: [place] (if you have MyChart) OR A paper copy in the mail If you have any lab test that is abnormal or we need to change your treatment, we will call you to review the results.   Testing/Procedures: [place]- Long Term Monitor Instructions  Your physician has requested you wear a ZIO patch monitor for 14 days.  This is a single patch monitor. [employer] supplies one patch monitor per enrollment. Additional stickers are not available. Please do not apply patch if you will be having a Nuclear Stress Test,  Echocardiogram, Cardiac CT, MRI, or Chest Xray during the period you would be wearing the  monitor. The patch cannot be worn during these tests. You cannot remove and re-apply the  ZIO XT patch monitor.  Your ZIO patch monitor will be mailed 3 day USPS to your address on file. It may take 3-5 days  to receive your monitor after you have been enrolled.  Once you have received your monitor, please review the enclosed instructions. Your monitor  has already been registered assigning a specific monitor serial # to you.  Billing and Patient  Assistance Program Information  We have supplied [employer] with any of your insurance information on file for billing purposes. [employer] offers a sliding scale Patient Assistance Program for patients that do not have  insurance, or whose insurance does not completely cover the cost of the ZIO monitor.  You must apply for the Patient Assistance Program to qualify for this discounted rate.  To apply, please call [employer] at [phone], select option 4, select option 2, ask to apply for  Patient Assistance Program. Theodore Demark will ask your household income, and how many people  are in your household. They will quote your out-of-pocket cost based on that information.  [employer] will also be able to set up a 101-month interest-free payment plan if needed.  Applying the monitor   Shave hair from upper left chest.  Hold abrader disc by orange tab. Rub abrader in 40 strokes over the upper left chest as  indicated in your monitor instructions.  Clean area with 4 enclosed alcohol pads. Let dry.  Apply patch as indicated in monitor instructions. Patch will be placed under collarbone on left  side of chest with arrow pointing upward.  Rub patch adhesive wings for 2 minutes. Remove white label marked "1". Remove the white  label marked "2". Rub patch adhesive wings for 2 additional  minutes.  While looking in a mirror, press and release button in center of patch. A small green light will  flash 3-4 times. This will be your only indicator that the monitor has been turned on.  Do not shower for the first 24 hours. You may shower after the first 24 hours.  Press the button if you feel a symptom. You will hear a small click. Record Date, Time and  Symptom in the Patient Logbook.  When you are ready to remove the patch, follow instructions on the last 2 pages of Patient  Logbook. Stick patch monitor onto the last page of Patient Logbook.  Place Patient Logbook in the blue and white box. Use locking tab on box and  tape box closed  securely. The blue and white box has prepaid postage on it. Please place it in the mailbox as  soon as possible. Your physician should have your test results approximately 7 days after the  monitor has been mailed back to [facility].  Call [place] at [phone] if you have questions regarding  your ZIO XT patch monitor. Call them immediately if you see an orange light blinking on your  monitor.  If your monitor falls off in less than 4 days, contact our Monitor department at [phone].  If your monitor becomes loose or falls off after 4 days call [employer] at [phone] for  suggestions on securing your monitor    Follow-Up: At [facility], you and your health needs are our priority.  As part of our continuing mission to provide you with exceptional heart care, we have created designated Provider Care Teams.  These Care Teams include your primary Cardiologist (physician) and Advanced Practice Providers (APPs -  Physician Assistants and Nurse Practitioners) who all work together to provide you with the care you need, when you need it.  We recommend signing up for the patient portal called "MyChart".  Sign up information is provided on this After Visit Summary.  MyChart is used to connect with patients for Virtual Visits (Telemedicine).  Patients are able to view lab/test results, encounter notes, upcoming appointments, etc.  Non-urgent messages can be sent to your provider as well.   To learn more about what you can do with MyChart, go to [email or web address].    Your next appointment:   16 week(s)  The format for your next appointment:   In Person  Provider:   [name], DO      Adopting a Healthy Lifestyle.  Know what a healthy weight is for you (roughly BMI <25) and aim to maintain this   Aim for 7+ servings of fruits and vegetables daily   65-80+ fluid ounces of water or unsweet tea for healthy kidneys   Limit to  max 1 drink of alcohol per day; avoid smoking/tobacco   Limit animal fats in diet for cholesterol and heart health - choose grass fed whenever available   Avoid highly processed foods, and foods high in saturated/trans fats   Aim for low stress - take time to unwind and care for your mental health   Aim for 150 min of moderate intensity exercise weekly for heart health, and weights twice weekly for bone health   Aim for 7-9 hours of sleep daily   When it comes to diets, agreement about the perfect plan isnt easy to find, even among the experts. Experts at the [employer] developed an idea known as the Healthy Eating Plate. Just imagine  a plate divided into logical, healthy portions.   The emphasis is on diet quality:   Load up on vegetables and fruits - one-half of your plate: Aim for color and variety, and remember that potatoes dont count.   Go for whole grains - one-quarter of your plate: Whole wheat, barley, wheat berries, quinoa, oats, brown rice, and foods made with them. If you want pasta, go with whole wheat pasta.   Protein power - one-quarter of your plate: Fish, chicken, beans, and nuts are all healthy, versatile protein sources. Limit red meat.   The diet, however, does go beyond the plate, offering a few other suggestions.   Use healthy plant oils, such as olive, canola, soy, corn, sunflower and peanut. Check the labels, and avoid partially hydrogenated oil, which have unhealthy trans fats.   If youre thirsty, drink water. Coffee and tea are good in moderation, but skip sugary drinks and limit milk and dairy products to one or two daily servings.   The type of carbohydrate in the diet is more important than the amount. Some sources of carbohydrates, such as vegetables, fruits, whole grains, and beans-are healthier than others.   Finally, stay active  Signed, [name], DO  12/14/2022 1:02 PM    [facility]

## 2022-12-14 NOTE — Patient Instructions (Signed)
Medication Instructions:  Your physician recommends that you continue on your current medications as directed. Please refer to the Current Medication list given to you today.  *If you need a refill on your cardiac medications before your next appointment, please call your pharmacy*   Lab Work: NONE If you have labs (blood work) drawn today and your tests are completely normal, you will receive your results only by: [place] (if you have MyChart) OR A paper copy in the mail If you have any lab test that is abnormal or we need to change your treatment, we will call you to review the results.   Testing/Procedures: [place] [place]- Long Term Monitor Instructions  Your physician has requested you wear a ZIO patch monitor for 14 days.  This is a single patch monitor. [employer] supplies one patch monitor per enrollment. Additional stickers are not available. Please do not apply patch if you will be having a Nuclear Stress Test,  Echocardiogram, Cardiac CT, MRI, or Chest Xray during the period you would be wearing the  monitor. The patch cannot be worn during these tests. You cannot remove and re-apply the  ZIO XT patch monitor.  Your ZIO patch monitor will be mailed 3 day USPS to your address on file. It may take 3-5 days  to receive your monitor after you have been enrolled.  Once you have received your monitor, please review the enclosed instructions. Your monitor  has already been registered assigning a specific monitor serial # to you.  Billing and Patient Assistance Program Information  We have supplied [employer] with any of your insurance information on file for billing purposes. [employer] offers a sliding scale Patient Assistance Program for patients that do not have  insurance, or whose insurance does not completely cover the cost of the ZIO monitor.  You must apply for the Patient Assistance Program to qualify for this discounted rate.  To apply, please call [employer] at [phone], select  option 4, select option 2, ask to apply for  Patient Assistance Program. Theodore Demark will ask your household income, and how many people  are in your household. They will quote your out-of-pocket cost based on that information.  [employer] will also be able to set up a 26-month interest-free payment plan if needed.  Applying the monitor   Shave hair from upper left chest.  Hold abrader disc by orange tab. Rub abrader in 40 strokes over the upper left chest as  indicated in your monitor instructions.  Clean area with 4 enclosed alcohol pads. Let dry.  Apply patch as indicated in monitor instructions. Patch will be placed under collarbone on left  side of chest with arrow pointing upward.  Rub patch adhesive wings for 2 minutes. Remove white label marked "1". Remove the white  label marked "2". Rub patch adhesive wings for 2 additional minutes.  While looking in a mirror, press and release button in center of patch. A small green light will  flash 3-4 times. This will be your only indicator that the monitor has been turned on.  Do not shower for the first 24 hours. You may shower after the first 24 hours.  Press the button if you feel a symptom. You will hear a small click. Record Date, Time and  Symptom in the Patient Logbook.  When you are ready to remove the patch, follow instructions on the last 2 pages of Patient  Logbook. Stick patch monitor onto the last page of Patient Logbook.  Place Patient Logbook in  the blue and white box. Use locking tab on box and tape box closed  securely. The blue and white box has prepaid postage on it. Please place it in the mailbox as  soon as possible. Your physician should have your test results approximately 7 days after the  monitor has been mailed back to [facility].  Call [place] at [phone] if you have questions regarding  your ZIO XT patch monitor. Call them immediately if you see an orange light blinking on your  monitor.   If your monitor falls off in less than 4 days, contact our Monitor department at [phone].  If your monitor becomes loose or falls off after 4 days call [employer] at [phone] for  suggestions on securing your monitor    Follow-Up: At [facility], you and your health needs are our priority.  As part of our continuing mission to provide you with exceptional heart care, we have created designated Provider Care Teams.  These Care Teams include your primary Cardiologist (physician) and Advanced Practice Providers (APPs -  Physician Assistants and Nurse Practitioners) who all work together to provide you with the care you need, when you need it.  We recommend signing up for the patient portal called "MyChart".  Sign up information is provided on this After Visit Summary.  MyChart is used to connect with patients for Virtual Visits (Telemedicine).  Patients are able to view lab/test results, encounter notes, upcoming appointments, etc.  Non-urgent messages can be sent to your provider as well.   To learn more about what you can do with MyChart, go to [email or web address].    Your next appointment:   16 week(s)  The format for your next appointment:   In Person  Provider:   [name], DO

## 2022-12-14 NOTE — Progress Notes (Unsigned)
[id number] ZIO XT from office inventory applied to patient.

## 2023-01-01 DIAGNOSIS — R002 Palpitations: Secondary | ICD-10-CM | POA: Diagnosis not present

## 2023-01-05 DIAGNOSIS — Z0100 Encounter for examination of eyes and vision without abnormal findings: Secondary | ICD-10-CM | POA: Diagnosis not present

## 2023-01-07 ENCOUNTER — Telehealth: Payer: Self-pay | Admitting: Hematology and Oncology

## 2023-01-07 NOTE — Telephone Encounter (Signed)
Called patient to confirm changed 3/8 appointment due to provider PAL. Patient r/s and notified.

## 2023-01-27 ENCOUNTER — Other Ambulatory Visit: Payer: Self-pay | Admitting: Cardiology

## 2023-02-05 ENCOUNTER — Encounter: Payer: Self-pay | Admitting: Family Medicine

## 2023-02-05 ENCOUNTER — Ambulatory Visit (INDEPENDENT_AMBULATORY_CARE_PROVIDER_SITE_OTHER): Payer: Medicare HMO | Admitting: Family Medicine

## 2023-02-05 VITALS — BP 144/64 | HR 62 | Temp 97.7°F | Ht 68.5 in | Wt 146.1 lb

## 2023-02-05 DIAGNOSIS — L03116 Cellulitis of left lower limb: Secondary | ICD-10-CM | POA: Insufficient documentation

## 2023-02-05 DIAGNOSIS — L089 Local infection of the skin and subcutaneous tissue, unspecified: Secondary | ICD-10-CM | POA: Insufficient documentation

## 2023-02-05 MED ORDER — CEPHALEXIN 500 MG PO CAPS: 500.0000 mg | ORAL_CAPSULE | Freq: Three times a day (TID) | ORAL | 0 refills | Status: DC

## 2023-02-05 MED ORDER — DOXYCYCLINE HYCLATE 100 MG PO TABS: 100.0000 mg | ORAL_TABLET | Freq: Two times a day (BID) | ORAL | 0 refills | Status: DC

## 2023-02-05 NOTE — Patient Instructions (Signed)
I think you have a skin infection  We need to watch for worsening redness/ swelling or pain  Also watch for fever  If it drains-keep it clean  Soap and water Elevate when you can and use a warm compress  Take both antibiotics as directed   Follow up with Dr Darnell Level next week for a re check   If gets worse before that call and let us know

## 2023-02-05 NOTE — Progress Notes (Unsigned)
Subjective:    Patient ID: Keith Jordan, male    DOB: [birth year], 82 y.o.   MRN: [id number]  HPI 82 yo pt of Dr Darnell Level presents for a sore spot on his L leg  Wt Readings from Last 3 Encounters:  02/05/23 146 lb 2 oz (66.3 kg)  12/14/22 144 lb 12.8 oz (65.7 kg)  10/26/22 149 lb 2 oz (67.6 kg)   21.90 kg/m  Vitals:   02/05/23 1550  BP: (!) 144/64  Pulse: 62  Temp: 97.7 F (36.5 C)  SpO2: 95%    Red spot on left lateral shin   9-10 days  Did appear after working in yard Did not remember a bite or trauma but cannot rule it out    It hurts/is sensitive to touch  Also stiff  Swollen  Not itchy    He had this before on the other leg    Takes eliquis Bruises easily   Patient Active Problem List   Diagnosis Date Noted   Skin infection 02/05/2023   Allergic rhinitis 05/29/2022   Dizziness 05/29/2022   Right foot pain 05/26/2022   Cellulitis and abscess of right leg 04/13/2022   Insomnia 11/17/2021   Nocturia 11/17/2021   Elevated blood pressure reading 11/17/2021   Typical atrial flutter ([place]) 10/13/2021   Skin sensation disturbance 10/13/2021   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   Abnormal breath sounds 10/11/2020   Sebaceous cyst 01/30/2020   Medicare annual wellness visit, subsequent 10/04/2019   BPH (benign prostatic hyperplasia) 09/29/2018   Health maintenance examination 09/01/2016   Advanced care planning/counseling discussion 09/01/2016   Type 2 diabetes mellitus with other specified complication ([place]) [id number]   Hyperlipidemia associated with type 2 diabetes mellitus ([place])    Primary osteoarthritis of knee    Ischemic optic neuropathy    Personal history of colonic adenoma 07/30/2008   Past Medical History:  Diagnosis Date   Abnormal breath sounds 10/11/2020   Acute sinusitis 01/29/2021   Advanced care planning/counseling discussion 09/01/2016   BPH (benign prostatic hyperplasia) 09/29/2018   Ear fullness, bilateral 04/03/2021    Health maintenance examination 09/01/2016   Hyperlipidemia    Irregular heart beat 03/24/2021   Ischemic optic neuropathy 2006   [place] [facility]   Leukocytosis 01/30/2021   Severe s/p ER eval 01/2021   Medicare annual wellness visit, subsequent 10/04/2019   Nausea without vomiting 03/24/2021   Personal history of colonic adenoma 07/30/2008   Prediabetes 07/05/2016   A1c 6.5% 03/2014    Prediabetes 07/05/2016   A1c 6.5% 03/2014    Primary osteoarthritis of knee    bilateral s/p L knee replacement, receives R knee injections [name], [name])   Sebaceous cyst 01/30/2020   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   Past Surgical History:  Procedure Laterality Date   A-FLUTTER ABLATION N/A 07/11/2021   Procedure: A-FLUTTER ABLATION;  Surgeon: [name], MD;  Location: [facility];  Service: Cardiovascular;  Laterality: N/A;   COLONOSCOPY  12/2013   no polyps, no rpt due [name])   CYST EXCISION  01/2020   TOTAL KNEE ARTHROPLASTY Left 2005   [name]   Social History   Tobacco Use   Smoking status: Never   Smokeless tobacco: Never  Vaping Use   Vaping Use: Never used  Substance Use Topics   Alcohol use: Yes    Comment: occasional   Drug use: No   Family History  Problem Relation Age of Onset  Blindness Mother 10       ?stroke in eyes   Lung disease Father        coal miner   Arthritis Father    Colon cancer Neg Hx    Pancreatic cancer Neg Hx    Stomach cancer Neg Hx    Esophageal cancer Neg Hx    Rectal cancer Neg Hx    Cancer Neg Hx    Diabetes Neg Hx    CAD Neg Hx    Stroke Neg Hx    No Known Allergies Current Outpatient Medications on File Prior to Visit  Medication Sig Dispense Refill   acetaminophen (TYLENOL) 500 MG tablet Take 500 mg by mouth every 6 (six) hours as needed for mild pain.     Ascorbic Acid (VITAMIN C) 1000 MG tablet Take 500 mg by mouth 2 (two) times daily.     atorvastatin (LIPITOR) 10 MG tablet TAKE 1 TABLET BY MOUTH  EVERY DAY 90 tablet 3   carboxymethylcellulose (REFRESH PLUS) 0.5 % SOLN Place 1 drop into both eyes 2 (two) times daily as needed (dry eyes).     carvedilol (COREG) 6.25 MG tablet TAKE 1 TABLET BY MOUTH TWICE A DAY 180 tablet 3   ELIQUIS 5 MG TABS tablet TAKE 1 TABLET BY MOUTH TWICE A DAY 60 tablet 5   Multiple Vitamins-Minerals (MULTIVITAMIN WITH MINERALS) tablet Take 1 tablet by mouth daily. Unknown strenght     Multiple Vitamins-Minerals (PRESERVISION AREDS) CAPS Take 1 capsule by mouth in the morning and at bedtime. (Patient taking differently: Take 1 capsule by mouth in the morning and at bedtime. Unknown strenght) 180 capsule 3   Olopatadine HCl 0.2 % SOLN Apply 1 drop to eye daily. (Both) 2.5 mL 0   sodium chloride (OCEAN) 0.65 % SOLN nasal spray Place 1 spray into both nostrils as needed for congestion.     triamcinolone (NASACORT) 55 MCG/ACT AERO nasal inhaler Place 1 spray into the nose daily as needed (sinus congestion). 1 each    No current facility-administered medications on file prior to visit.    Review of Systems  Constitutional:  Negative for activity change, appetite change, fatigue, fever and unexpected weight change.  HENT:  Negative for congestion, rhinorrhea, sore throat and trouble swallowing.   Eyes:  Negative for pain, redness, itching and visual disturbance.  Respiratory:  Negative for cough, chest tightness, shortness of breath and wheezing.   Cardiovascular:  Negative for chest pain and palpitations.  Gastrointestinal:  Negative for abdominal pain, blood in stool, constipation, diarrhea and nausea.  Endocrine: Negative for cold intolerance, heat intolerance, polydipsia and polyuria.  Genitourinary:  Negative for difficulty urinating, dysuria, frequency and urgency.  Musculoskeletal:  Negative for arthralgias, joint swelling and myalgias.  Skin:  Positive for color change. Negative for pallor, rash and wound.  Neurological:  Negative for dizziness, tremors,  weakness, numbness and headaches.  Hematological:  Negative for adenopathy. Does not bruise/bleed easily.  Psychiatric/Behavioral:  Negative for decreased concentration and dysphoric mood. The patient is not nervous/anxious.        Objective:   Physical Exam Constitutional:      General: He is not in acute distress.    Appearance: Normal appearance. He is normal weight. He is not ill-appearing.  Cardiovascular:     Rate and Rhythm: Normal rate.  Pulmonary:     Effort: Pulmonary effort is normal. No respiratory distress.     Breath sounds: Normal breath sounds.  Skin:    Findings:  Erythema present. No bruising or rash.     Comments: 2 by 3 cm of firm erythema and induration on L lower lateral leg Not fluctuant Mildly tender and warm No streaking No scale or skin interruption   Varicosities noted in both LE    Neurological:     Mental Status: He is alert.     Sensory: No sensory deficit.  Psychiatric:        Mood and Affect: Mood normal.           Assessment & Plan:   Problem List Items Addressed This Visit       Musculoskeletal and Integument   Skin infection - Primary    Left lower leg-suspect early cellulitis/abscess (too firm to I and D today) - no material to culture Has had a similar problem on other leg in the past  This did follow working outside - does not remember trauma or insect bite  Disc tx with abx- keflex and doxycycline px (to cover mrsa as well as usual skin bacteria) Inst to elevated and try warm compress, keep clean Disc s/s to watch for, inc redness/tenderness, size or drainage ER precautions reviewed  Plan f/u with pcp next wk       Relevant Medications   cephALEXin (KEFLEX) 500 MG capsule

## 2023-02-07 NOTE — Assessment & Plan Note (Signed)
Left lower leg-suspect early cellulitis/abscess (too firm to I and D today) - no material to culture Has had a similar problem on other leg in the past  This did follow working outside - does not remember trauma or insect bite  Disc tx with abx- keflex and doxycycline px (to cover mrsa as well as usual skin bacteria) Inst to elevated and try warm compress, keep clean Disc s/s to watch for, inc redness/tenderness, size or drainage ER precautions reviewed  Plan f/u with pcp next wk

## 2023-02-12 ENCOUNTER — Ambulatory Visit (INDEPENDENT_AMBULATORY_CARE_PROVIDER_SITE_OTHER): Payer: Medicare HMO | Admitting: Family Medicine

## 2023-02-12 ENCOUNTER — Encounter: Payer: Self-pay | Admitting: Family Medicine

## 2023-02-12 VITALS — BP 138/64 | HR 53 | Temp 97.4°F | Ht 68.5 in | Wt 145.4 lb

## 2023-02-12 DIAGNOSIS — L03116 Cellulitis of left lower limb: Secondary | ICD-10-CM | POA: Diagnosis not present

## 2023-02-12 DIAGNOSIS — C8307 Small cell B-cell lymphoma, spleen: Secondary | ICD-10-CM | POA: Diagnosis not present

## 2023-02-12 DIAGNOSIS — I872 Venous insufficiency (chronic) (peripheral): Secondary | ICD-10-CM | POA: Diagnosis not present

## 2023-02-12 NOTE — Assessment & Plan Note (Signed)
Exam today consistent with venous insufficiency with significant reticular veins to foot as well as small varicosities to BLE.  Strong pulses suggests arterial circulation is intact.  Suggested low grade compression stockings once skin infection is fully healed.

## 2023-02-12 NOTE — Assessment & Plan Note (Addendum)
Closely followed by oncology.

## 2023-02-12 NOTE — Patient Instructions (Addendum)
Finish antibiotics, continue leg elevation, warm compresses to area.  Let us know if fever, spreading redness, or area coming to a head.  Once fully healed, consider low grade compression stockings for venous insufficiency.

## 2023-02-12 NOTE — Assessment & Plan Note (Signed)
LLE cellulitis, anticipate purulent cellulitis given initial appearance - this is improving well on doxycycline + cephalexin. Discussed finishing antibiotic course, continue supportive measures of warm compresses and leg elevation.

## 2023-02-12 NOTE — Progress Notes (Signed)
Patient ID: AMITOJ BOLICH, male    DOB: 17-Dec-1941, 82 y.o.   MRN: [id number]  This visit was conducted in person.  BP 138/64   Pulse (!) 53   Temp (!) 97.4 F (36.3 C) (Temporal)   Ht 5' 8.5" (1.74 m)   Wt 145 lb 6 oz (65.9 kg)   SpO2 95%   BMI 21.78 kg/m    CC: skin infection Subjective:   HPI: Keith Jordan is a 82 y.o. male presenting on 02/12/2023 for Skin Problem (Here for 1 wk skin infection f/u of L lower leg. Seen previously by Dr. Glori Bickers. )   Seen last week by Dr Glori Bickers, note reviewed.  L lateral shin purulent cellulitis treated with doxycycline/keflex 10d course. See photo at last visit. He's tolerated medication well with mild loose stools.  No fevers/chills, redness/swelling is improving, pain Is improving  He notes he had teeth cleaned on Monday and infection developed on Friday. He does normally take amoxicillin 2012m prior to dental procedures due h/o L knee replacement. Also notes he worked out in yard prior to infection developing. He does regularly wear long pants and no known skin scrathes scrapes or bug bites.  He continues eliquis for typical atrial flutter, hopes med to go generic soon.      Relevant past medical, surgical, family and social history reviewed and updated as indicated. Interim medical history since our last visit reviewed. Allergies and medications reviewed and updated. Outpatient Medications Prior to Visit  Medication Sig Dispense Refill   acetaminophen (TYLENOL) 500 MG tablet Take 500 mg by mouth every 6 (six) hours as needed for mild pain.     amoxicillin (AMOXIL) 500 MG capsule Take 500 mg by mouth. Takes 4 capsules by mouth 1 hour prior to dental procedure     Ascorbic Acid (VITAMIN C) 1000 MG tablet Take 500 mg by mouth 2 (two) times daily.     atorvastatin (LIPITOR) 10 MG tablet TAKE 1 TABLET BY MOUTH EVERY DAY 90 tablet 3   carboxymethylcellulose (REFRESH PLUS) 0.5 % SOLN Place 1 drop into both eyes 2 (two) times daily as needed  (dry eyes).     carvedilol (COREG) 6.25 MG tablet TAKE 1 TABLET BY MOUTH TWICE A DAY 180 tablet 3   cephALEXin (KEFLEX) 500 MG capsule Take 1 capsule (500 mg total) by mouth 3 (three) times daily. 30 capsule 0   doxycycline (VIBRA-TABS) 100 MG tablet Take 1 tablet (100 mg total) by mouth 2 (two) times daily. 20 tablet 0   ELIQUIS 5 MG TABS tablet TAKE 1 TABLET BY MOUTH TWICE A DAY 60 tablet 5   Multiple Vitamins-Minerals (MULTIVITAMIN WITH MINERALS) tablet Take 1 tablet by mouth daily. Unknown strenght     Multiple Vitamins-Minerals (PRESERVISION AREDS) CAPS Take 1 capsule by mouth in the morning and at bedtime. (Patient taking differently: Take 1 capsule by mouth in the morning and at bedtime. Unknown strenght) 180 capsule 3   Olopatadine HCl 0.2 % SOLN Apply 1 drop to eye daily. (Both) 2.5 mL 0   sodium chloride (OCEAN) 0.65 % SOLN nasal spray Place 1 spray into both nostrils as needed for congestion.     triamcinolone (NASACORT) 55 MCG/ACT AERO nasal inhaler Place 1 spray into the nose daily as needed (sinus congestion). 1 each    No facility-administered medications prior to visit.     Per HPI unless specifically indicated in ROS section below Review of Systems  Objective:  BP 138/64  Pulse (!) 53   Temp (!) 97.4 F (36.3 C) (Temporal)   Ht 5' 8.5" (1.74 m)   Wt 145 lb 6 oz (65.9 kg)   SpO2 95%   BMI 21.78 kg/m   Wt Readings from Last 3 Encounters:  02/12/23 145 lb 6 oz (65.9 kg)  02/05/23 146 lb 2 oz (66.3 kg)  12/14/22 144 lb 12.8 oz (65.7 kg)      Physical Exam Vitals and nursing note reviewed.  Constitutional:      Appearance: Normal appearance. He is not ill-appearing.  Musculoskeletal:        General: No tenderness.     Right lower leg: No edema.     Left lower leg: Edema (tr) present.     Comments: 2+ DP on left  Skin:    General: Skin is warm and dry.     Findings: Erythema present. No lesion, rash or wound.          Comments: Significantly improvement in  mild erythema surrounding left lateral leg area of cellulitis without fluctuance or significant pain with palpation   Neurological:     Mental Status: He is alert.         Results for orders placed or performed in visit on 12/04/22  Lactate dehydrogenase (LDH)  Result Value Ref Range   LDH 146 98 - 192 U/L  CMP (Cancer Center only)  Result Value Ref Range   Sodium 141 135 - 145 mmol/L   Potassium 3.9 3.5 - 5.1 mmol/L   Chloride 104 98 - 111 mmol/L   CO2 34 (H) 22 - 32 mmol/L   Glucose, Bld 196 (H) 70 - 99 mg/dL   BUN 29 (H) 8 - 23 mg/dL   Creatinine 0.97 0.61 - 1.24 mg/dL   Calcium 9.4 8.9 - 10.3 mg/dL   Total Protein 6.2 (L) 6.5 - 8.1 g/dL   Albumin 3.9 3.5 - 5.0 g/dL   AST 25 15 - 41 U/L   ALT 12 0 - 44 U/L   Alkaline Phosphatase 79 38 - 126 U/L   Total Bilirubin 0.6 0.3 - 1.2 mg/dL   GFR, Estimated >60 >60 mL/min   Anion gap 3 (L) 5 - 15  CBC with Differential (Cancer Center Only)  Result Value Ref Range   WBC Count 196.5 (HH) 4.0 - 10.5 K/uL   RBC 3.82 (L) 4.22 - 5.81 MIL/uL   Hemoglobin 12.0 (L) 13.0 - 17.0 g/dL   HCT 37.0 (L) 39.0 - 52.0 %   MCV 96.9 80.0 - 100.0 fL   MCH 31.4 26.0 - 34.0 pg   MCHC 32.4 30.0 - 36.0 g/dL   RDW 15.1 11.5 - 15.5 %   Platelet Count 106 (L) 150 - 400 K/uL   nRBC 0.0 0.0 - 0.2 %   Neutrophils Relative % 2 %   Neutro Abs 3.4 1.7 - 7.7 K/uL   Lymphocytes Relative 95 %   Lymphs Abs 186.5 (H) 0.7 - 4.0 K/uL   Monocytes Relative 3 %   Monocytes Absolute 5.5 (H) 0.1 - 1.0 K/uL   Eosinophils Relative 0 %   Eosinophils Absolute 0.6 (H) 0.0 - 0.5 K/uL   Basophils Relative 0 %   Basophils Absolute 0.1 0.0 - 0.1 K/uL   WBC Morphology SMUDGE CELLS    RBC Morphology MORPHOLOGY UNREMARKABLE    Smear Review Normal platelet morphology    Immature Granulocytes 0 %   Abs Immature Granulocytes 0.40 (H) 0.00 - 0.07 K/uL  Assessment & Plan:   Problem List Items Addressed This Visit     Splenic marginal zone b-cell lymphoma ([place])     Closely followed by oncology.       Relevant Medications   amoxicillin (AMOXIL) 500 MG capsule   Cellulitis of left lower leg - Primary    LLE cellulitis, anticipate purulent cellulitis given initial appearance - this is improving well on doxycycline + cephalexin. Discussed finishing antibiotic course, continue supportive measures of warm compresses and leg elevation.       Chronic venous insufficiency of lower extremity    Exam today consistent with venous insufficiency with significant reticular veins to foot as well as small varicosities to BLE.  Strong pulses suggests arterial circulation is intact.  Suggested low grade compression stockings once skin infection is fully healed.         No orders of the defined types were placed in this encounter.   No orders of the defined types were placed in this encounter.   Patient Instructions  Finish antibiotics, continue leg elevation, warm compresses to area.  Let us know if fever, spreading redness, or area coming to a head.  Once fully healed, consider low grade compression stockings for venous insufficiency.   Follow up plan: Return if symptoms worsen or fail to improve.  [name], MD

## 2023-03-05 ENCOUNTER — Other Ambulatory Visit: Payer: Medicare HMO

## 2023-03-05 ENCOUNTER — Ambulatory Visit: Payer: Medicare HMO | Admitting: Hematology and Oncology

## 2023-03-26 ENCOUNTER — Inpatient Hospital Stay: Payer: Medicare HMO | Attending: Hematology and Oncology

## 2023-03-26 ENCOUNTER — Other Ambulatory Visit: Payer: Self-pay

## 2023-03-26 ENCOUNTER — Inpatient Hospital Stay: Payer: Medicare HMO | Admitting: Hematology and Oncology

## 2023-03-26 ENCOUNTER — Telehealth: Payer: Self-pay | Admitting: *Deleted

## 2023-03-26 VITALS — BP 136/63 | HR 59 | Temp 98.3°F | Resp 17 | Wt 145.3 lb

## 2023-03-26 DIAGNOSIS — D696 Thrombocytopenia, unspecified: Secondary | ICD-10-CM | POA: Insufficient documentation

## 2023-03-26 DIAGNOSIS — R161 Splenomegaly, not elsewhere classified: Secondary | ICD-10-CM | POA: Diagnosis not present

## 2023-03-26 DIAGNOSIS — C8307 Small cell B-cell lymphoma, spleen: Secondary | ICD-10-CM | POA: Insufficient documentation

## 2023-03-26 DIAGNOSIS — D72820 Lymphocytosis (symptomatic): Secondary | ICD-10-CM

## 2023-03-26 DIAGNOSIS — I4892 Unspecified atrial flutter: Secondary | ICD-10-CM | POA: Diagnosis not present

## 2023-03-26 DIAGNOSIS — D72829 Elevated white blood cell count, unspecified: Secondary | ICD-10-CM | POA: Insufficient documentation

## 2023-03-26 DIAGNOSIS — R110 Nausea: Secondary | ICD-10-CM | POA: Insufficient documentation

## 2023-03-26 LAB — CMP (CANCER CENTER ONLY)
ALT: 12 U/L (ref 0–44)
AST: 26 U/L (ref 15–41)
Albumin: 4 g/dL (ref 3.5–5.0)
Alkaline Phosphatase: 82 U/L (ref 38–126)
Anion gap: 5 (ref 5–15)
BUN: 32 mg/dL — ABNORMAL HIGH (ref 8–23)
CO2: 32 mmol/L (ref 22–32)
Calcium: 9.2 mg/dL (ref 8.9–10.3)
Chloride: 104 mmol/L (ref 98–111)
Creatinine: 1.08 mg/dL (ref 0.61–1.24)
GFR, Estimated: >60 mL/min (ref >60)
Glucose, Bld: 163 mg/dL — ABNORMAL HIGH (ref 70–99)
Potassium: 4.3 mmol/L (ref 3.5–5.1)
Sodium: 141 mmol/L (ref 135–145)
Total Bilirubin: 0.6 mg/dL (ref 0.3–1.2)
Total Protein: 6.8 g/dL (ref 6.5–8.1)

## 2023-03-26 LAB — CBC WITH DIFFERENTIAL (CANCER CENTER ONLY)
Abs Immature Granulocytes: 0 10*3/uL (ref 0.00–0.07)
Band Neutrophils: 0 %
Basophils Absolute: 0 10*3/uL (ref 0.0–0.1)
Basophils Relative: 0 %
Blasts: 0 %
Eosinophils Absolute: 2.1 10*3/uL — ABNORMAL HIGH (ref 0.0–0.5)
Eosinophils Relative: 1 %
HCT: 37.5 % — ABNORMAL LOW (ref 39.0–52.0)
Hemoglobin: 11.9 g/dL — ABNORMAL LOW (ref 13.0–17.0)
Lymphocytes Relative: 93 %
Lymphs Abs: 197.6 10*3/uL — ABNORMAL HIGH (ref 0.7–4.0)
MCH: 31.4 pg (ref 26.0–34.0)
MCHC: 31.7 g/dL (ref 30.0–36.0)
MCV: 98.9 fL (ref 80.0–100.0)
Metamyelocytes Relative: 0 %
Monocytes Absolute: 4.2 10*3/uL — ABNORMAL HIGH (ref 0.1–1.0)
Monocytes Relative: 2 %
Myelocytes: 0 %
Neutro Abs: 8.5 10*3/uL — ABNORMAL HIGH (ref 1.7–7.7)
Neutrophils Relative %: 4 %
Other: 0 %
Platelet Count: 120 10*3/uL — ABNORMAL LOW (ref 150–400)
Promyelocytes Relative: 0 %
RBC: 3.79 MIL/uL — ABNORMAL LOW (ref 4.22–5.81)
RDW: 14.6 % (ref 11.5–15.5)
Smear Review: NORMAL
WBC Count: 212.4 10*3/uL (ref 4.0–10.5)
nRBC: 0 % (ref 0.0–0.2)
nRBC: 0 /100 WBC

## 2023-03-26 LAB — LACTATE DEHYDROGENASE: LDH: 153 U/L (ref 98–192)

## 2023-03-26 NOTE — Telephone Encounter (Signed)
CRITICAL VALUE STICKER  CRITICAL VALUE: WBC 212.4  RECEIVER (on-site recipient of call):Sandi K, RN  DATE & TIME NOTIFIED: 03/26/23; 1120  MESSENGER (representative from lab): Pam  MD NOTIFIED: Dr. Lorenso Courier  TIME OF NOTIFICATION:11:30  RESPONSE:  Information acknowledged - patient has appt w/Dr. Lorenso Courier today at [phone]

## 2023-03-26 NOTE — Progress Notes (Signed)
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 Referred to Emergency Department. 02/05/2021: establish care with Dr. Lorenso Courier  02/24/2021: Week 1 of Monotherapy Rituximab 03/03/2021:  Week 2 of Monotherapy Rituximab 03/10/2021:  Week 3 of Monotherapy Rituximab. WBC 109.8 03/17/2021: Week 4 of Monotherapy Rituximab. WBC 94.8 03/24/2021: WBC 59.8 05/01/2021: WBC 89.1, Hgb 13.5, Plt 136 09/03/2021: WBC 97.8, Hgb 12.9, Plt 141 12/03/2021: WBC 137.8, Hgb 13.6, MCV 96.1, Plt 130 03/04/2022: WBC 131.1, Hgb 12.0, MCV 97.7, Plt 121 06/04/2022: WBC 144.9, Hgb 12.5, MCV 96.5, Plt 156 09/04/2022: WBC 182.3, Plt 12.3, MCV 96.9, Plt 126  Interval History:  Keith Jordan 82 y.o. male with medical history significant for splenic marginal zone lymphoma who presents for a follow up visit. The patient's last visit was on 09/04/2022. In the interim since the last visit he has been in stable health.   On exam today Keith Jordan reports he has been well overall in the since her last visit 6 months ago.  He has had no hospitalizations or emergency room visits.  He did have a erythematous swelling of his leg for which she was treated for cellulitis with antibiotics.  He notes that it resolved very quickly.  He reports he had a very similar infection on his other leg the prior year.  He notes his energy levels "could be better".  He notes that his appetite has been good and his weight has been stable with no fluctuations.  He has been having some difficulty with sleeping where he wakes up every 1-2 hours because his  feet feel warm.  He reports that he is not having any shortness of breath or sweating when he wakes up at night.  He notes he has worn a heart monitor performed and no abnormalities were found.  He also has not started any new medications.  Overall he is at his baseline level of health with no questions concerns or comments today.Marland Kitchen  He denies any fevers, chills, sweats, nausea, vomiting or diarrhea.  A full 10 point ROS is listed below.  MEDICAL HISTORY:  Past Medical History:  Diagnosis Date   Abnormal breath sounds 10/11/2020   Acute sinusitis 01/29/2021   Advanced care planning/counseling discussion 09/01/2016   BPH (benign prostatic hyperplasia) 09/29/2018   Ear fullness, bilateral 04/03/2021   Health maintenance examination 09/01/2016   Hyperlipidemia    Irregular heart beat 03/24/2021   Ischemic optic neuropathy 2006   [place] at [facility]   Leukocytosis 01/30/2021   Severe s/p ER eval 01/2021   Medicare annual wellness visit, subsequent 10/04/2019   Nausea without vomiting 03/24/2021   Personal history of colonic adenoma 07/30/2008   Prediabetes 07/05/2016   A1c 6.5% 03/2014    Prediabetes 07/05/2016   A1c 6.5% 03/2014    Primary osteoarthritis of knee    bilateral s/p L knee replacement, receives R knee injections [name], [name])   Sebaceous cyst 01/30/2020   Splenic marginal zone b-cell lymphoma ([place]) 02/05/2021    SURGICAL HISTORY: Past Surgical History:  Procedure Laterality Date   A-FLUTTER ABLATION N/A 07/11/2021   Procedure: A-FLUTTER ABLATION;  Surgeon: [name], MD;  [facility]  INVASIVE CV LAB;  Service: Cardiovascular;  Laterality: N/A;   COLONOSCOPY  12/2013   no polyps, no rpt due [name])   CYST EXCISION  01/2020   TOTAL KNEE ARTHROPLASTY Left 2005   [name]    SOCIAL HISTORY: Social History   Socioeconomic History   Marital status: Married    Spouse name: Not on file   Number of children: Not on file   Years of education: Not on file   Highest education  level: Not on file  Occupational History   Not on file  Tobacco Use   Smoking status: Never   Smokeless tobacco: Never  Vaping Use   Vaping Use: Never used  Substance and Sexual Activity   Alcohol use: Yes    Comment: occasional   Drug use: No   Sexual activity: Not Currently  Other Topics Concern   Not on file  Social History Narrative   Lives with wife, 2 cats   Occ: retired Personal assistant   Activity: works in yard   Diet: some water, fruits/vegetables daily   Social Determinants of Radio broadcast assistant Strain: [place]  (10/20/2022)   Overall Financial Resource Strain (CARDIA)    Difficulty of Paying Living Expenses: Not hard at all  Food Insecurity: No Food Insecurity (10/20/2022)   Hunger Vital Sign    Worried About Running Out of Food in the Last Year: Never true    [place] in the Last Year: Never true  Transportation Needs: No Transportation Needs (10/20/2022)   PRAPARE - Hydrologist (Medical): No    Lack of Transportation (Non-Medical): No  Physical Activity: Sufficiently Active (10/20/2022)   Exercise Vital Sign    Days of Exercise per Week: 3 days    Minutes of Exercise per Session: 60 min  Stress: No Stress Concern Present (10/20/2022)   [place]    Feeling of Stress : Not at all  Social Connections: Moderately Isolated (10/20/2022)   Social Connection and Isolation Panel [NHANES]    Frequency of Communication with Friends and Family: Twice a week    Frequency of Social Gatherings with Friends and Family: Once a week    Attends Religious Services: Never    Marine scientist or Organizations: No    Attends Archivist Meetings: Never    Marital Status: Married  Human resources officer Violence: Not At Risk (10/20/2022)   Humiliation, Afraid, Rape, and Kick questionnaire    Fear of Current or Ex-Partner: No    Emotionally Abused: No     Physically Abused: No    Sexually Abused: No    FAMILY HISTORY: Family History  Problem Relation Age of Onset   Blindness Mother 54       ?stroke in eyes   Lung disease Father        coal miner   Arthritis Father    Colon cancer Neg Hx    Pancreatic cancer Neg Hx    Stomach cancer Neg Hx    Esophageal cancer Neg Hx    Rectal cancer Neg Hx    Cancer Neg Hx    Diabetes Neg Hx    CAD Neg Hx    Stroke Neg Hx     ALLERGIES:  has No Known Allergies.  MEDICATIONS:  Current Outpatient Medications  Medication Sig Dispense Refill   acetaminophen (TYLENOL) 500 MG tablet Take 500 mg by mouth every 6 (six) hours  as needed for mild pain.     Ascorbic Acid (VITAMIN C) 1000 MG tablet Take 500 mg by mouth 2 (two) times daily.     atorvastatin (LIPITOR) 10 MG tablet TAKE 1 TABLET BY MOUTH EVERY DAY 90 tablet 3   carboxymethylcellulose (REFRESH PLUS) 0.5 % SOLN Place 1 drop into both eyes 2 (two) times daily as needed (dry eyes).     carvedilol (COREG) 6.25 MG tablet TAKE 1 TABLET BY MOUTH TWICE A DAY 180 tablet 3   cephALEXin (KEFLEX) 500 MG capsule Take 1 capsule (500 mg total) by mouth 3 (three) times daily. (Patient not taking: Reported on 03/26/2023) 30 capsule 0   doxycycline (VIBRA-TABS) 100 MG tablet Take 1 tablet (100 mg total) by mouth 2 (two) times daily. (Patient not taking: Reported on 03/26/2023) 20 tablet 0   ELIQUIS 5 MG TABS tablet TAKE 1 TABLET BY MOUTH TWICE A DAY 60 tablet 5   Multiple Vitamins-Minerals (MULTIVITAMIN WITH MINERALS) tablet Take 1 tablet by mouth daily. Unknown strenght     Multiple Vitamins-Minerals (PRESERVISION AREDS) CAPS Take 1 capsule by mouth in the morning and at bedtime. (Patient taking differently: Take 1 capsule by mouth in the morning and at bedtime. Unknown strenght) 180 capsule 3   Olopatadine HCl 0.2 % SOLN Apply 1 drop to eye daily. (Both) 2.5 mL 0   sodium chloride (OCEAN) 0.65 % SOLN nasal spray Place 1 spray into both nostrils as needed for  congestion.     triamcinolone (NASACORT) 55 MCG/ACT AERO nasal inhaler Place 1 spray into the nose daily as needed (sinus congestion). 1 each    No current facility-administered medications for this visit.    REVIEW OF SYSTEMS:   Constitutional: ( - ) fevers, ( - )  chills , ( - ) night sweats Eyes: ( - ) blurriness of vision, ( - ) double vision, ( - ) watery eyes Ears, nose, mouth, throat, and face: ( - ) mucositis, ( - ) sore throat Respiratory: ( - ) cough, ( - ) dyspnea, ( - ) wheezes Cardiovascular: ( - ) palpitation, ( - ) chest discomfort, ( - ) lower extremity swelling Gastrointestinal:  ( - ) nausea, ( - ) heartburn, ( - ) change in bowel habits Skin: ( - ) abnormal skin rashes Lymphatics: ( - ) new lymphadenopathy, ( - ) easy bruising Neurological: ( - ) numbness, ( -) tingling, ( - ) new weaknesses Behavioral/Psych: ( - ) mood change, ( - ) new changes  All other systems were reviewed with the patient and are negative.  PHYSICAL EXAMINATION: ECOG PERFORMANCE STATUS: 1 - Symptomatic but completely ambulatory  Vitals:   03/26/23 1147  BP: 136/63  Pulse: (!) 59  Resp: 17  Temp: 98.3 F (36.8 C)  SpO2: 96%     Filed Weights   03/26/23 1147  Weight: 145 lb 5 oz (65.9 kg)     GENERAL: well appearing elderly Caucasian male alert, no distress and comfortable SKIN: skin color, texture, turgor are normal, no rashes or significant lesions EYES: conjunctiva are pink and non-injected, sclera clear LUNGS: clear to auscultation and percussion with normal breathing effort HEART: regular rate & rhythm and no murmurs and no lower extremity edema Musculoskeletal: no cyanosis of digits and no clubbing  PSYCH: alert & oriented x 3, fluent speech NEURO: no focal motor/sensory deficits  LABORATORY DATA:  I have reviewed the data as listed    Latest Ref Rng & Units 03/26/2023  11:03 AM 12/04/2022   10:51 AM 09/04/2022   11:10 AM  CBC  WBC 4.0 - 10.5 K/uL 212.4  196.5  182.3    Hemoglobin 13.0 - 17.0 g/dL 11.9  12.0  12.3   Hematocrit 39.0 - 52.0 % 37.5  37.0  38.0   Platelets 150 - 400 K/uL 120  106  126        Latest Ref Rng & Units 03/26/2023   11:03 AM 12/04/2022   10:51 AM 10/20/2022    9:36 AM  CMP  Glucose 70 - 99 mg/dL 163  196  111   BUN 8 - 23 mg/dL 32  29  26   Creatinine 0.61 - 1.24 mg/dL 1.08  0.97  1.08   Sodium 135 - 145 mmol/L 141  141  142   Potassium 3.5 - 5.1 mmol/L 4.3  3.9  3.7   Chloride 98 - 111 mmol/L 104  104  103   CO2 22 - 32 mmol/L 32  34  32   Calcium 8.9 - 10.3 mg/dL 9.2  9.4  8.9   Total Protein 6.5 - 8.1 g/dL 6.8  6.2  6.2   Total Bilirubin 0.3 - 1.2 mg/dL 0.6  0.6  0.7   Alkaline Phos 38 - 126 U/L 82  79  73   AST 15 - 41 U/L 26  25  24    ALT 0 - 44 U/L 12  12  10     RADIOGRAPHIC STUDIES: No results found.   ASSESSMENT & PLAN Keith Jordan 82 y.o. male with medical history significant for splenic marginal zone lymphoma. CT CAP from 02/13/21  showed only splenic involvement and a bone marrow biopsy from 02/21/21 confirmed the diagnosis.  The patient has only leukocytosis and splenomegaly.  The recommendation is monotherapy rituximab 375 mg per metered squared q. 7 days x 4 doses. He completed this on 03/17/2021.   At this time is marginal zone lymphoma appears like an indolent lymphocyte predominant variant.  These tend to behave more like CLL.  Given that I would recommend holding on treatment unless the patient were to have difficulties with splenomegaly, anemia, or worsening thrombocytopenia.  For treatment choices one could consider treatment with Ibrutinib/Zanbrutinib vs Bendamustine + ritux/obinutuzumab. Would need caution with ibrutinib given patient's cardiac history and current anticoagulation therapy.    Patient is doing well without any significant limitations. Labs from today show lymphocytosis and modest stable thrombocytopenia. Plan for patient to return to the clinic in 12 weeks for labs and 6 months for clinic  visit.  #Splenic Marginal Zone Lymphoma, stable --findings are consistent with a splenic marginal zone lymphoma. --patient completed bone marrow biopsy and a CT chest abdomen pelvis. Disease appear limited to peripheral blood and spleen.  --Treatment of choice with confirmed splenic marginal zone lymphoma is rituximab 375 mg/m q. 7 days x 4 doses. He completed this on 03/17/2021.  --Due to persistent lymphocytosis, recommend to continue with allopurinol to prevent TLS. --if there is concern for worsening disease with an indication for treatment can consider treatment with Ibrutinib/Zanbrutinib vs Bendamustine + ritux/obinutuzumab. Would need caution with ibrutinib given patient's cardiac history and current anticoagulation therapy.  --labs today show WBC 212.4, Hgb 11.9, MCV 98.9, Plt 120 --RTC in 6 months with interval 3 month labs   #Thrombocytopenia-improved --Plt today stable at 120, fluctuate.  --likely 2/2 to splenomegaly.   #Nausea --Take Zofran 8 mg q 8 hours PRN.   #Atrial Flutter --patient follows with Cardiology  No orders of the defined types were placed in this encounter.  All questions were answered. The patient knows to call the clinic with any problems, questions or concerns.  A total of more than 30 minutes were spent on this encounter and over half of that time was spent on counseling and coordination of care as outlined above.   Ledell Peoples, MD Department of Hematology/Oncology [place] [facility] Phone: [phone] Pager: [phone] Email: [email or web address]

## 2023-04-09 ENCOUNTER — Encounter: Payer: Self-pay | Admitting: Cardiology

## 2023-04-09 ENCOUNTER — Ambulatory Visit: Payer: Medicare HMO | Attending: Cardiology | Admitting: Cardiology

## 2023-04-09 VITALS — BP 136/60 | HR 65 | Ht 68.5 in | Wt 147.0 lb

## 2023-04-09 DIAGNOSIS — I483 Typical atrial flutter: Secondary | ICD-10-CM | POA: Diagnosis not present

## 2023-04-09 DIAGNOSIS — I491 Atrial premature depolarization: Secondary | ICD-10-CM

## 2023-04-09 NOTE — Patient Instructions (Signed)
Medication Instructions:  Your physician recommends that you continue on your current medications as directed. Please refer to the Current Medication list given to you today.  *If you need a refill on your cardiac medications before your next appointment, please call your pharmacy*   Lab Work: None   Testing/Procedures: None   Follow-Up: At [facility], you and your health needs are our priority.  As part of our continuing mission to provide you with exceptional heart care, we have created designated Provider Care Teams.  These Care Teams include your primary Cardiologist (physician) and Advanced Practice Providers (APPs -  Physician Assistants and Nurse Practitioners) who all work together to provide you with the care you need, when you need it.  We recommend signing up for the patient portal called "MyChart".  Sign up information is provided on this After Visit Summary.  MyChart is used to connect with patients for Virtual Visits (Telemedicine).  Patients are able to view lab/test results, encounter notes, upcoming appointments, etc.  Non-urgent messages can be sent to your provider as well.   To learn more about what you can do with MyChart, go to [email or web address].    Your next appointment:   9 month(s)  Provider:   [name] Tobb, DO    

## 2023-04-09 NOTE — Progress Notes (Unsigned)
Cardiology Office Note:    Date:  04/09/2023   ID:  Keith Jordan, DOB [birth year], MRN [id number]  PCP:  Eustaquio Boyden, MD  Cardiologist:  Thomasene Ripple, DO  Electrophysiologist:  None   Referring MD: Eustaquio Boyden, MD   " I am doing well"  History of Present Illness:    Keith Jordan is a 82 y.o. 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   I saw the patient on 04/17/2021 and at that time he reported intermittent palpitations, therefore a zio live monitor was placed on the patient. Two days of wearing the monitor, atrial flutter was noted and we started the patient on anticoagulation with Eliquis ( CHADS2 VASc score of 2). He was also started on Cardizem 120 mg daily.   I last saw the patient on August 02, 2019.  At that time we continued his Cardizem.  Since I saw the patient he also had seen EP.  He had maintained sinus rhythm.  Eliquis was stopped.  I saw the patient on 02/03/2022 at that time he had not tolerated the metoprolol so I stopped the metoprolol and started low-dose carvedilol.  He also had not tolerated Cardizem.  At his last visit he was experiencing some symptoms placed a monitor on the patient which came back with no evidence of A-fib.  Today he offers no complaints.  Past Medical History:  Diagnosis Date   Abnormal breath sounds 10/11/2020   Acute sinusitis 01/29/2021   Advanced care planning/counseling discussion 09/01/2016   BPH (benign prostatic hyperplasia) 09/29/2018   Ear fullness, bilateral 04/03/2021   Health maintenance examination 09/01/2016   Hyperlipidemia    Irregular heart beat 03/24/2021   Ischemic optic neuropathy 2006   [place] [facility]   Leukocytosis 01/30/2021   Severe s/p ER eval 01/2021   Medicare annual wellness visit, subsequent 10/04/2019   Nausea without vomiting 03/24/2021   Personal history of colonic adenoma  07/30/2008   Prediabetes 07/05/2016   A1c 6.5% 03/2014    Prediabetes 07/05/2016   A1c 6.5% 03/2014    Primary osteoarthritis of knee    bilateral s/p L knee replacement, receives R knee injections [name], [name])   Sebaceous cyst 01/30/2020   Splenic marginal zone b-cell lymphoma 02/05/2021    Past Surgical History:  Procedure Laterality Date   A-FLUTTER ABLATION N/A 07/11/2021   Procedure: A-FLUTTER ABLATION;  Surgeon: [name], MD;  Location: [facility];  Service: Cardiovascular;  Laterality: N/A;   COLONOSCOPY  12/2013   no polyps, no rpt due [name])   CYST EXCISION  01/2020   TOTAL KNEE ARTHROPLASTY Left 2005   [name]    Current Medications: Current Meds  Medication Sig   acetaminophen (TYLENOL) 500 MG tablet Take 500 mg by mouth every 6 (six) hours as needed for mild pain.   Ascorbic Acid (VITAMIN C) 1000 MG tablet Take 500 mg by mouth 2 (two) times daily.   atorvastatin (LIPITOR) 10 MG tablet TAKE 1 TABLET BY MOUTH EVERY DAY   carboxymethylcellulose (REFRESH PLUS) 0.5 % SOLN Place 1 drop into both eyes 2 (two) times daily as needed (dry eyes).   carvedilol (COREG) 6.25 MG tablet TAKE 1 TABLET BY MOUTH TWICE A DAY   ELIQUIS 5 MG TABS tablet TAKE 1 TABLET BY MOUTH TWICE A DAY   Multiple Vitamins-Minerals (MULTIVITAMIN WITH MINERALS) tablet Take 1 tablet by mouth daily.  Unknown strenght   Multiple Vitamins-Minerals (PRESERVISION AREDS) CAPS Take 1 capsule by mouth in the morning and at bedtime. (Patient taking differently: Take 1 capsule by mouth in the morning and at bedtime. Unknown strenght)   Olopatadine HCl 0.2 % SOLN Apply 1 drop to eye daily. (Both)   sodium chloride (OCEAN) 0.65 % SOLN nasal spray Place 1 spray into both nostrils as needed for congestion.   triamcinolone (NASACORT) 55 MCG/ACT AERO nasal inhaler Place 1 spray into the nose daily as needed (sinus congestion).   [DISCONTINUED] cephALEXin (KEFLEX) 500 MG capsule Take 1 capsule (500 mg total) by  mouth 3 (three) times daily.   [DISCONTINUED] doxycycline (VIBRA-TABS) 100 MG tablet Take 1 tablet (100 mg total) by mouth 2 (two) times daily.     Allergies:   Patient has no known allergies.   Social History   Socioeconomic History   Marital status: Married    Spouse name: Not on file   Number of children: Not on file   Years of education: Not on file   Highest education level: Not on file  Occupational History   Not on file  Tobacco Use   Smoking status: Never   Smokeless tobacco: Never  Vaping Use   Vaping Use: Never used  Substance and Sexual Activity   Alcohol use: Yes    Comment: occasional   Drug use: No   Sexual activity: Not Currently  Other Topics Concern   Not on file  Social History Narrative   Lives with wife, 2 cats   Occ: retired Counsellor   Activity: works in yard   Diet: some water, fruits/vegetables daily   Social Determinants of Corporate investment banker Strain: Low Risk  (10/20/2022)   Overall Financial Resource Strain (CARDIA)    Difficulty of Paying Living Expenses: Not hard at all  Food Insecurity: No Food Insecurity (10/20/2022)   Hunger Vital Sign    Worried About Running Out of Food in the Last Year: Never true    Ran Out of Food in the Last Year: Never true  Transportation Needs: No Transportation Needs (10/20/2022)   PRAPARE - Administrator, Civil Service (Medical): No    Lack of Transportation (Non-Medical): No  Physical Activity: Sufficiently Active (10/20/2022)   Exercise Vital Sign    Days of Exercise per Week: 3 days    Minutes of Exercise per Session: 60 min  Stress: No Stress Concern Present (10/20/2022)   [employer] of Occupational Health - Occupational Stress Questionnaire    Feeling of Stress : Not at all  Social Connections: Moderately Isolated (10/20/2022)   Social Connection and Isolation Panel [NHANES]    Frequency of Communication with Friends and Family: Twice a week    Frequency of Social  Gatherings with Friends and Family: Once a week    Attends Religious Services: Never    Database administrator or Organizations: No    Attends Engineer, structural: Never    Marital Status: Married     Family History: The patient's family history includes Arthritis in his father; Blindness (age of onset: 45) in his mother; Lung disease in his father. There is no history of Colon cancer, Pancreatic cancer, Stomach cancer, Esophageal cancer, Rectal cancer, Cancer, Diabetes, CAD, or Stroke.  ROS:   Review of Systems  Constitution: Negative for decreased appetite, fever and weight gain.  HENT: Negative for congestion, ear discharge, hoarse voice and sore throat.   Eyes: Negative for discharge,  redness, vision loss in right eye and visual halos.  Cardiovascular: Negative for chest pain, dyspnea on exertion, leg swelling, orthopnea and palpitations.  Respiratory: Negative for cough, hemoptysis, shortness of breath and snoring.   Endocrine: Negative for heat intolerance and polyphagia.  Hematologic/Lymphatic: Negative for bleeding problem. Does not bruise/bleed easily.  Skin: Negative for flushing, nail changes, rash and suspicious lesions.  Musculoskeletal: Negative for arthritis, joint pain, muscle cramps, myalgias, neck pain and stiffness.  Gastrointestinal: Negative for abdominal pain, bowel incontinence, diarrhea and excessive appetite.  Genitourinary: Negative for decreased libido, genital sores and incomplete emptying.  Neurological: Negative for brief paralysis, focal weakness, headaches and loss of balance.  Psychiatric/Behavioral: Negative for altered mental status, depression and suicidal ideas.  Allergic/Immunologic: Negative for HIV exposure and persistent infections.    EKGs/Labs/Other Studies Reviewed:    The following studies were reviewed today:   EKG: None today   01/01/2023 Patch Wear Time:  13 days and 23 hours (2023-12-18T12:36:37-0500 to  2024-01-01T12:36:29-0500)   Patient had a min HR of 47 bpm, max HR of 81 bpm, and avg HR of 59 bpm. Predominant underlying rhythm was Sinus Rhythm. First Degree AV Block was present. Isolated SVEs were rare (<1.0%), SVE Couplets were rare (<1.0%), and no SVE Triplets were present.  Isolated VEs were rare (<1.0%), and no VE Couplets or VE Triplets were present.   Symptoms associated with sinus rhythm.   Conclusion: Normal unremarkable study.    TTE 2022 IMPRESSIONS  1. Left ventricular ejection fraction, by estimation, is 70 to 75%. The  left ventricle has hyperdynamic function. The left ventricle has no  regional wall motion abnormalities. Left ventricular diastolic parameters  are consistent with Grade I diastolic  dysfunction (impaired relaxation).   2. Right ventricular systolic function is normal. The right ventricular  size is mildly enlarged. There is mildly elevated pulmonary artery  systolic pressure. The estimated right ventricular systolic pressure is  36.4 mmHg.   3. The mitral valve is normal in structure. No evidence of mitral valve  regurgitation. No evidence of mitral stenosis.   4. The aortic valve is normal in structure. Aortic valve regurgitation is  not visualized. No aortic stenosis is present.   5. The inferior vena cava is normal in size with greater than 50%  respiratory variability, suggesting right atrial pressure of 3 mmHg.   FINDINGS   Left Ventricle: Left ventricular ejection fraction, by estimation, is 70  to 75%. The left ventricle has hyperdynamic function. The left ventricle  has no regional wall motion abnormalities. The left ventricular internal  cavity size was normal in size.  There is no left ventricular hypertrophy. Left ventricular diastolic  parameters are consistent with Grade I diastolic dysfunction (impaired  relaxation).   Right Ventricle: The right ventricular size is mildly enlarged. No  increase in right ventricular wall thickness.  Right ventricular systolic  function is normal. There is mildly elevated pulmonary artery systolic  pressure. The tricuspid regurgitant  velocity is 2.89 m/s, and with an assumed right atrial pressure of 3 mmHg,  the estimated right ventricular systolic pressure is 36.4 mmHg.   Left Atrium: Left atrial size was normal in size.   Right Atrium: Right atrial size was normal in size.   Pericardium: There is no evidence of pericardial effusion.   Mitral Valve: The mitral valve is normal in structure. No evidence of  mitral valve regurgitation. No evidence of mitral valve stenosis.   Tricuspid Valve: The tricuspid valve is normal in structure.  Tricuspid  valve regurgitation is mild . No evidence of tricuspid stenosis.   Aortic Valve: The aortic valve is normal in structure. Aortic valve  regurgitation is not visualized. No aortic stenosis is present.   Pulmonic Valve: The pulmonic valve was normal in structure. Pulmonic valve  regurgitation is not visualized. No evidence of pulmonic stenosis.   Aorta: The aortic root is normal in size and structure.   Venous: The inferior vena cava is normal in size with greater than 50%  respiratory variability, suggesting right atrial pressure of 3 mmHg.   IAS/Shunts: No atrial level shunt detected by color flow Doppler.        Zio monitor The patient wore the monitor for 13 days, 21 hours starting April 17, 2021.   Indication: Palpitations   The minimum heart rate was 46 bpm, maximum heart rate was 149 bpm, and average heart rate was 68 bpm. Predominant underlying rhythm was Sinus Rhythm.   3 Supraventricular Tachycardia runs occurred, the run with the fastest interval lasting 4 beats with a max rate of 126 bpm, the longest lasting 7 beats with an avg rate of 103 bpm.     Atrial Flutter occurred (3% burden), ranging from 46-149 bpm (avg of 82 bpm), the longest lasting 1 hour 41 mins with an avg rate of 92 bpm.   Premature atrial complexes  were occasional (1.0%, 13493). Premature Ventricular complexes were rare less than 1%. No ventricular tachycardia, no pauses present.   Symptoms are associated with atrial flutter, sinus rhythm and premature atrial complexes.       Conclusion: This study is remarkable for the following:                       1.  Symptomatic atrial flutter                       2.  Occasional premature atrial complexes.    Recent Labs: 10/20/2022: Magnesium 2.1 03/26/2023: ALT 12; BUN 32; Creatinine 1.08; Hemoglobin 11.9; Platelet Count 120; Potassium 4.3; Sodium 141  Recent Lipid Panel    Component Value Date/Time   CHOL 120 10/20/2022 0936   CHOL 140 04/16/2015 0000   CHOL 140 04/16/2015 0000   TRIG 90.0 10/20/2022 0936   TRIG 94 04/16/2015 0000   TRIG 94 04/16/2015 0000   HDL 29.90 (L) 10/20/2022 0936   CHOLHDL 4 10/20/2022 0936   VLDL 18.0 10/20/2022 0936   LDLCALC 72 10/20/2022 0936   LDLCALC 86 04/16/2015 0000   LDLCALC 86 04/16/2015 0000    Physical Exam:    VS:  BP 136/60   Pulse 65   Ht 5' 8.5" (1.74 m)   Wt 147 lb (66.7 kg)   SpO2 95%   BMI 22.03 kg/m     Wt Readings from Last 3 Encounters:  04/09/23 147 lb (66.7 kg)  03/26/23 145 lb 5 oz (65.9 kg)  02/12/23 145 lb 6 oz (65.9 kg)     GEN: Well nourished, well developed in no acute distress HEENT: Normal NECK: No JVD; No carotid bruits LYMPHATICS: No lymphadenopathy CARDIAC: S1S2 noted,RRR, no murmurs, rubs, gallops RESPIRATORY:  Clear to auscultation without rales, wheezing or rhonchi  ABDOMEN: Soft, non-tender, non-distended, +bowel sounds, no guarding. EXTREMITIES: No edema, No cyanosis, no clubbing MUSCULOSKELETAL:  No deformity  SKIN: Warm and dry NEUROLOGIC:  Alert and oriented x 3, non-focal PSYCHIATRIC:  Normal affect, good insight  ASSESSMENT:    1. Typical  atrial flutter   2. Premature atrial complex     PLAN:    He appears to be doing well from a cardiovascular standpoint.  No changes in his  medication today.  Continue his anticoagulation Eliquis 5 mg twice daily.  The patient is in agreement with the above plan. The patient left the office in stable condition.  The patient will follow up in 6 months or sooner if needed.  Medication Adjustments/Labs and Tests Ordered: Current medicines are reviewed at length with the patient today.  Concerns regarding medicines are outlined above.  No orders of the defined types were placed in this encounter.  No orders of the defined types were placed in this encounter.   There are no Patient Instructions on file for this visit.   Adopting a Healthy Lifestyle.  Know what a healthy weight is for you (roughly BMI <25) and aim to maintain this   Aim for 7+ servings of fruits and vegetables daily   65-80+ fluid ounces of water or unsweet tea for healthy kidneys   Limit to max 1 drink of alcohol per day; avoid smoking/tobacco   Limit animal fats in diet for cholesterol and heart health - choose grass fed whenever available   Avoid highly processed foods, and foods high in saturated/trans fats   Aim for low stress - take time to unwind and care for your mental health   Aim for 150 min of moderate intensity exercise weekly for heart health, and weights twice weekly for bone health   Aim for 7-9 hours of sleep daily   When it comes to diets, agreement about the perfect plan isnt easy to find, even among the experts. Experts at the [facility] [employer] developed an idea known as the Healthy Eating Plate. Just imagine a plate divided into logical, healthy portions.   The emphasis is on diet quality:   Load up on vegetables and fruits - one-half of your plate: Aim for color and variety, and remember that potatoes dont count.   Go for whole grains - one-quarter of your plate: Whole wheat, barley, wheat berries, quinoa, oats, brown rice, and foods made with them. If you want pasta, go with whole wheat pasta.   Protein power -  one-quarter of your plate: Fish, chicken, beans, and nuts are all healthy, versatile protein sources. Limit red meat.   The diet, however, does go beyond the plate, offering a few other suggestions.   Use healthy plant oils, such as olive, canola, soy, corn, sunflower and peanut. Check the labels, and avoid partially hydrogenated oil, which have unhealthy trans fats.   If youre thirsty, drink water. Coffee and tea are good in moderation, but skip sugary drinks and limit milk and dairy products to one or two daily servings.   The type of carbohydrate in the diet is more important than the amount. Some sources of carbohydrates, such as vegetables, fruits, whole grains, and beans-are healthier than others.   Finally, stay active  Signed, Thomasene Ripple, DO  04/09/2023 10:25 AM    [facility]

## 2023-04-14 DIAGNOSIS — R262 Difficulty in walking, not elsewhere classified: Secondary | ICD-10-CM | POA: Diagnosis not present

## 2023-04-14 DIAGNOSIS — M1711 Unilateral primary osteoarthritis, right knee: Secondary | ICD-10-CM | POA: Diagnosis not present

## 2023-04-14 DIAGNOSIS — M25561 Pain in right knee: Secondary | ICD-10-CM | POA: Diagnosis not present

## 2023-04-14 DIAGNOSIS — M25661 Stiffness of right knee, not elsewhere classified: Secondary | ICD-10-CM | POA: Diagnosis not present

## 2023-04-19 ENCOUNTER — Other Ambulatory Visit: Payer: Self-pay | Admitting: Cardiology

## 2023-04-19 DIAGNOSIS — I483 Typical atrial flutter: Secondary | ICD-10-CM

## 2023-04-19 NOTE — Telephone Encounter (Signed)
Pt last saw Dr Servando Salina 04/09/23, last labs 03/26/23 Creat 1.08, age 82, weight 66.7kg, based on specified criteria pt is on appropriate dosage of Eliquis  BID for aflutter.  Will refill rx.

## 2023-04-21 DIAGNOSIS — M1711 Unilateral primary osteoarthritis, right knee: Secondary | ICD-10-CM | POA: Diagnosis not present

## 2023-04-21 DIAGNOSIS — M25561 Pain in right knee: Secondary | ICD-10-CM | POA: Diagnosis not present

## 2023-04-21 DIAGNOSIS — M25661 Stiffness of right knee, not elsewhere classified: Secondary | ICD-10-CM | POA: Diagnosis not present

## 2023-04-21 DIAGNOSIS — R262 Difficulty in walking, not elsewhere classified: Secondary | ICD-10-CM | POA: Diagnosis not present

## 2023-04-26 ENCOUNTER — Ambulatory Visit (INDEPENDENT_AMBULATORY_CARE_PROVIDER_SITE_OTHER): Payer: Medicare HMO | Admitting: Family Medicine

## 2023-04-26 ENCOUNTER — Encounter: Payer: Self-pay | Admitting: Family Medicine

## 2023-04-26 VITALS — BP 134/68 | HR 59 | Temp 97.6°F | Ht 68.5 in | Wt 146.1 lb

## 2023-04-26 DIAGNOSIS — R0789 Other chest pain: Secondary | ICD-10-CM

## 2023-04-26 DIAGNOSIS — R0989 Other specified symptoms and signs involving the circulatory and respiratory systems: Secondary | ICD-10-CM

## 2023-04-26 DIAGNOSIS — R0689 Other abnormalities of breathing: Secondary | ICD-10-CM

## 2023-04-26 DIAGNOSIS — E1169 Type 2 diabetes mellitus with other specified complication: Secondary | ICD-10-CM | POA: Diagnosis not present

## 2023-04-26 DIAGNOSIS — C8307 Small cell B-cell lymphoma, spleen: Secondary | ICD-10-CM | POA: Diagnosis not present

## 2023-04-26 DIAGNOSIS — I483 Typical atrial flutter: Secondary | ICD-10-CM

## 2023-04-26 DIAGNOSIS — I440 Atrioventricular block, first degree: Secondary | ICD-10-CM | POA: Diagnosis not present

## 2023-04-26 LAB — POCT GLYCOSYLATED HEMOGLOBIN (HGB A1C): Hemoglobin A1C: 6.2 % — AB (ref 4.0–5.6)

## 2023-04-26 NOTE — Assessment & Plan Note (Signed)
Appreciate cardiology care.  °

## 2023-04-26 NOTE — Assessment & Plan Note (Signed)
Mildly coarse sounding but overall stable.

## 2023-04-26 NOTE — Assessment & Plan Note (Signed)
Appreciate oncology care.  °

## 2023-04-26 NOTE — Progress Notes (Signed)
Ph: [phone]       Fax: [phone]   Patient ID: Keith Jordan, male    DOB: 11-21-1941, 82 y.o.   MRN: [id number]  This visit was conducted in person.  BP 134/68   Pulse (!) 59   Temp 97.6 F (36.4 C) (Temporal)   Ht 5' 8.5" (1.74 m)   Wt 146 lb 2 oz (66.3 kg)   SpO2 95%   BMI 21.90 kg/m    CC: DM f/u visit  Subjective:   HPI: Keith Jordan is a 82 y.o. male presenting on 04/26/2023 for Medical Management of Chronic Issues (Here for 6 mo DM f/u.)   Notes 3-4 months of chest discomfort at rest, in evenings. No symptoms with exercise, yard work activity. Notes increased mucous production. No dyspnea, cough, fever/chills, dizziness.  Recent EKG and heart monitor stable.   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 Continues surveillance with regular labwork.   Atrial flutter followed by cardiology Dr Servando Salina, on eliquis and carvedilol.   DM - does regularly check sugars a few times a week - fasting 120s. Compliant with antihyperglycemic regimen which includes: diet controlled. He's cut out ice cream and other added sugars. He started cinnamon tablets once daily. Denies hypoglycemic symptoms. Denies paresthesias, blurry vision. Last diabetic eye exam 09/2022 at [facility] - will request records. Glucometer brand: [employer] brand. Last foot exam: 2019 - DUE. DSME: completed remotely.  Lab Results  Component Value Date   HGBA1C 6.2 (A) 04/26/2023   Diabetic Foot Exam - Simple   Simple Foot Form Diabetic Foot exam was performed with the following findings: Yes 04/26/2023 11:30 AM  Visual Inspection No deformities, no ulcerations, no other skin breakdown bilaterally: Yes Sensation Testing Intact to touch and monofilament testing bilaterally: Yes Pulse Check Posterior Tibialis and Dorsalis pulse intact bilaterally: Yes Comments 2+ DP bilaterally No claudication     Lab Results   Component Value Date   MICROALBUR 2.9 (H) 09/26/2018         Relevant past medical, surgical, family and social history reviewed and updated as indicated. Interim medical history since our last visit reviewed. Allergies and medications reviewed and updated. Outpatient Medications Prior to Visit  Medication Sig Dispense Refill   acetaminophen (TYLENOL) 500 MG tablet Take 500 mg by mouth every 6 (six) hours as needed for mild pain.     Ascorbic Acid (VITAMIN C) 1000 MG tablet Take 500 mg by mouth 2 (two) times daily.     atorvastatin (LIPITOR) 10 MG tablet TAKE 1 TABLET BY MOUTH EVERY DAY 90 tablet 3   carboxymethylcellulose (REFRESH PLUS) 0.5 % SOLN Place 1 drop into both eyes 2 (two) times daily as needed (dry eyes).     carvedilol (COREG) 6.25 MG tablet TAKE 1 TABLET BY MOUTH TWICE A DAY 180 tablet 3   ELIQUIS 5 MG TABS tablet TAKE 1 TABLET BY MOUTH TWICE A DAY 60 tablet 5   Multiple Vitamins-Minerals (MULTIVITAMIN WITH MINERALS) tablet Take 1 tablet by mouth daily. Unknown strenght     Multiple Vitamins-Minerals (PRESERVISION AREDS) CAPS Take 1 capsule by mouth in the morning and at bedtime. (Patient taking differently: Take 1 capsule by mouth in the morning and at bedtime. Unknown strenght) 180 capsule 3   Olopatadine HCl 0.2 % SOLN Apply 1 drop to eye daily. (Both) 2.5 mL 0   sodium chloride (OCEAN) 0.65 % SOLN nasal spray  Place 1 spray into both nostrils as needed for congestion.     triamcinolone (NASACORT) 55 MCG/ACT AERO nasal inhaler Place 1 spray into the nose daily as needed (sinus congestion). 1 each    No facility-administered medications prior to visit.     Per HPI unless specifically indicated in ROS section below Review of Systems  Objective:  BP 134/68   Pulse (!) 59   Temp 97.6 F (36.4 C) (Temporal)   Ht 5' 8.5" (1.74 m)   Wt 146 lb 2 oz (66.3 kg)   SpO2 95%   BMI 21.90 kg/m   Wt Readings from Last 3 Encounters:  04/26/23 146 lb 2 oz (66.3 kg)  04/09/23  147 lb (66.7 kg)  03/26/23 145 lb 5 oz (65.9 kg)      Physical Exam Vitals and nursing note reviewed.  Constitutional:      Appearance: Normal appearance. He is not ill-appearing.     Comments: Tick removed crawling on shirt - he's been working in yard. No further tick bites found on trunk   HENT:     Head: Normocephalic and atraumatic.     Mouth/Throat:     Mouth: Mucous membranes are moist.     Pharynx: Oropharynx is clear. No oropharyngeal exudate or posterior oropharyngeal erythema.  Eyes:     Extraocular Movements: Extraocular movements intact.     Conjunctiva/sclera: Conjunctivae normal.     Pupils: Pupils are equal, round, and reactive to light.  Cardiovascular:     Rate and Rhythm: Normal rate and regular rhythm.     Pulses: Normal pulses.     Heart sounds: Normal heart sounds. No murmur heard. Pulmonary:     Effort: Pulmonary effort is normal. No respiratory distress.     Breath sounds: Normal breath sounds. No wheezing, rhonchi or rales.     Comments: No reproducible chest wall tenderness Chest:     Chest wall: No tenderness.  Musculoskeletal:     Right lower leg: No edema.     Left lower leg: No edema.     Comments: See HPI for foot exam if done  Skin:    General: Skin is warm and dry.     Findings: No rash.  Neurological:     Mental Status: He is alert.  Psychiatric:        Mood and Affect: Mood normal.        Behavior: Behavior normal.       Results for orders placed or performed in visit on 04/26/23  POCT glycosylated hemoglobin (Hb A1C)  Result Value Ref Range   Hemoglobin A1C 6.2 (A) 4.0 - 5.6 %   HbA1c POC (<> result, manual entry)     HbA1c, POC (prediabetic range)     HbA1c, POC (controlled diabetic range)     Lab Results  Component Value Date   CREATININE 1.08 03/26/2023   BUN 32 (H) 03/26/2023   NA 141 03/26/2023   K 4.3 03/26/2023   CL 104 03/26/2023   CO2 32 03/26/2023    Lab Results  Component Value Date   WBC 212.4 (HH) 03/26/2023    HGB 11.9 (L) 03/26/2023   HCT 37.5 (L) 03/26/2023   MCV 98.9 03/26/2023   PLT 120 (L) 03/26/2023    EKG - sinus bradycardia rate 50s, normal axis, prolonged PR consistent with 1st degree AV block, no hypertrophy or acute ST/T changes, good R wave progression  Assessment & Plan:   Problem List Items Addressed This Visit  Type 2 diabetes mellitus with other specified complication (HCC) - Primary    Chronic, A1c back in prediabetes range. Congratulated on healthy diet choices. Continue monitoring with diet only.  Encouraged he request diabetic retinopathy screen next eye exam.       Relevant Orders   POCT glycosylated hemoglobin (Hb A1C) (Completed)   Abnormal breath sounds    Mildly coarse sounding but overall stable.       Splenic marginal zone b-cell lymphoma [facility])    Appreciate oncology care.       Relevant Orders   DG Chest 2 View   Typical atrial flutter [facility])    Appreciate cardiology care.       First degree AV block    Continue eliquis followed by cardiology.  He is on carvedilol - will monitor.       Chest discomfort    Of unclear cause, associated with increased chest congestion. Suggested trial fast relief guaifenesin.  Not consistent with cardiac, GI or MSK cause.  EKG reassuring, with 1st degree AVB.  Advised f/u with cardiology if not improved with expectorant.       Relevant Orders   EKG 12-Lead (Completed)   DG Chest 2 View     No orders of the defined types were placed in this encounter.   Orders Placed This Encounter  Procedures   DG Chest 2 View    Standing Status:   Future    Standing Expiration Date:   04/25/2024    Order Specific Question:   Reason for Exam (SYMPTOM  OR DIAGNOSIS REQUIRED)    Answer:   chest discomfort, lymphoma    Order Specific Question:   Preferred imaging location?    Answer:   [facility]   POCT glycosylated hemoglobin (Hb A1C)   EKG 12-Lead    Patient Instructions  EKG today Next time you see Dr  Brooke Dare ask for diabetic eye exam.  Sugars are doing great! A1c is back in prediabetes range.  May try fast relief guaifenesin with glass of water as needed for mucous production.  If ongoing chest discomfort, call for sooner appointment with Dr Servando Salina.  Good to see you today Return as needed or in 6 months for physical/wellness visit  Follow up plan: Return in about 6 months (around 10/26/2023) for annual exam, prior fasting for blood work, medicare wellness visit.  Eustaquio Boyden, MD

## 2023-04-26 NOTE — Assessment & Plan Note (Addendum)
Chronic, A1c back in prediabetes range. Congratulated on healthy diet choices. Continue monitoring with diet only.  Encouraged he request diabetic retinopathy screen next eye exam.

## 2023-04-26 NOTE — Assessment & Plan Note (Addendum)
Continue eliquis followed by cardiology.  He is on carvedilol - will monitor.

## 2023-04-26 NOTE — Assessment & Plan Note (Addendum)
Of unclear cause, associated with increased chest congestion. Suggested trial fast relief guaifenesin.  Not consistent with cardiac, GI or MSK cause.  EKG reassuring, with 1st degree AVB.  Advised f/u with cardiology if not improved with expectorant.

## 2023-04-26 NOTE — Patient Instructions (Addendum)
EKG today Next time you see Dr Brooke Dare ask for diabetic eye exam.  Sugars are doing great! A1c is back in prediabetes range.  May try fast relief guaifenesin with glass of water as needed for mucous production.  If ongoing chest discomfort, call for sooner appointment with Dr Servando Salina.  Good to see you today Return as needed or in 6 months for physical/wellness visit

## 2023-04-28 DIAGNOSIS — M25561 Pain in right knee: Secondary | ICD-10-CM | POA: Diagnosis not present

## 2023-04-28 DIAGNOSIS — M25661 Stiffness of right knee, not elsewhere classified: Secondary | ICD-10-CM | POA: Diagnosis not present

## 2023-04-28 DIAGNOSIS — M1711 Unilateral primary osteoarthritis, right knee: Secondary | ICD-10-CM | POA: Diagnosis not present

## 2023-04-28 DIAGNOSIS — R262 Difficulty in walking, not elsewhere classified: Secondary | ICD-10-CM | POA: Diagnosis not present

## 2023-05-01 ENCOUNTER — Ambulatory Visit
Admission: EM | Admit: 2023-05-01 | Discharge: 2023-05-01 | Disposition: A | Discharge status: Home / Self Care | Payer: Medicare HMO

## 2023-05-01 DIAGNOSIS — M79605 Pain in left leg: Secondary | ICD-10-CM | POA: Diagnosis not present

## 2023-05-01 DIAGNOSIS — S8012XA Contusion of left lower leg, initial encounter: Secondary | ICD-10-CM | POA: Diagnosis not present

## 2023-05-01 NOTE — Discharge Instructions (Addendum)
Blood work is pending.  Will call if it is abnormal.  Please follow-up with your family medicine doctor as soon as possible to have recheck and reevaluation.  Go to the emergency department if symptoms persist or worsen.

## 2023-05-01 NOTE — ED Provider Notes (Addendum)
[facility]    CSN: [id number] Arrival date & time: 05/01/23  1107      History   Chief Complaint Chief Complaint  Patient presents with   Skin Problem    HPI Keith Jordan is a 82 y.o. male.   Patient presents with left upper leg pain that started a few days prior.  Patient reports that he is concerned for cellulitis given that this feels similar and he had cellulitis of the lower leg in February.  That cellulitis resolved with antibiotics.  He reports that he has some pain and discoloration and feels like "it is affecting the muscle".  Patient has pain with ambulation.  He denies any injury to the area but does report that he has been working outside a lot.  He does take Eliquis and denies any recent long distance travel.  Denies numbness or tingling.  Denies any fever.     Past Medical History:  Diagnosis Date   Abnormal breath sounds 10/11/2020   Acute sinusitis 01/29/2021   Advanced care planning/counseling discussion 09/01/2016   BPH (benign prostatic hyperplasia) 09/29/2018   Ear fullness, bilateral 04/03/2021   Health maintenance examination 09/01/2016   Hyperlipidemia    Irregular heart beat 03/24/2021   Ischemic optic neuropathy 2006   [place] [facility]   Leukocytosis 01/30/2021   Severe s/p ER eval 01/2021   Medicare annual wellness visit, subsequent 10/04/2019   Nausea without vomiting 03/24/2021   Personal history of colonic adenoma 07/30/2008   Prediabetes 07/05/2016   A1c 6.5% 03/2014    Prediabetes 07/05/2016   A1c 6.5% 03/2014    Primary osteoarthritis of knee    bilateral s/p L knee replacement, receives R knee injections [name])   Sebaceous cyst 01/30/2020   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    Patient Active Problem List   Diagnosis Date Noted   First degree AV block 04/26/2023   Chest discomfort 04/26/2023   Chronic venous insufficiency of lower extremity 02/12/2023   Cellulitis of left lower leg 02/05/2023   Allergic  rhinitis 05/29/2022   Dizziness 05/29/2022   Right foot pain 05/26/2022   Cellulitis and abscess of right leg 04/13/2022   Insomnia 11/17/2021   Nocturia 11/17/2021   Elevated blood pressure reading 11/17/2021   Typical atrial flutter (HCC) 10/13/2021   Skin sensation disturbance 10/13/2021   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   Abnormal breath sounds 10/11/2020   Sebaceous cyst 01/30/2020   Medicare annual wellness visit, subsequent 10/04/2019   BPH (benign prostatic hyperplasia) 09/29/2018   Health maintenance examination 09/01/2016   Advanced care planning/counseling discussion 09/01/2016   Type 2 diabetes mellitus with other specified complication (HCC) 07/05/2016   Hyperlipidemia associated with type 2 diabetes mellitus (HCC)    Primary osteoarthritis of knee    Ischemic optic neuropathy    Personal history of colonic adenoma 07/30/2008    Past Surgical History:  Procedure Laterality Date   A-FLUTTER ABLATION N/A 07/11/2021   Procedure: A-FLUTTER ABLATION;  Surgeon: [name], MD;  Location: [facility] INVAS[facility];  Service: Cardiovascular;  Laterality: N/A;   COLONOSCOPY  12/2013   no polyps, no rpt due [name])   CYST EXCISION  01/2020   TOTAL KNEE ARTHROPLASTY Left 2005   [name]       Home Medications    Prior to Admission medications   Medication Sig Start Date End Date Taking? Authorizing Provider  acetaminophen (TYLENOL) 500 MG tablet Take 500  mg by mouth every 6 (six) hours as needed for mild pain.   Yes [provider]  Ascorbic Acid (VITAMIN C) 1000 MG tablet Take 500 mg by mouth 2 (two) times daily.   Yes [provider]  atorvastatin (LIPITOR) 10 MG tablet TAKE 1 TABLET BY MOUTH EVERY DAY 10/21/22  Yes Eustaquio Boyden, MD  carboxymethylcellulose (REFRESH PLUS) 0.5 % SOLN Place 1 drop into both eyes 2 (two) times daily as needed (dry eyes).   Yes [provider]  carvedilol (COREG) 6.25 MG tablet TAKE 1  TABLET BY MOUTH TWICE A DAY 01/27/23  Yes Tobb, [name], DO  ELIQUIS 5 MG TABS tablet TAKE 1 TABLET BY MOUTH TWICE A DAY 04/19/23  Yes Tobb, [name], DO  Multiple Vitamins-Minerals (MULTIVITAMIN WITH MINERALS) tablet Take 1 tablet by mouth daily. Unknown strenght   Yes [provider]  Olopatadine HCl 0.2 % SOLN Apply 1 drop to eye daily. (Both) 05/29/22  Yes Eustaquio Boyden, MD  sodium chloride (OCEAN) 0.65 % SOLN nasal spray Place 1 spray into both nostrils as needed for congestion.   Yes [provider]  Multiple Vitamins-Minerals (PRESERVISION AREDS) CAPS Take 1 capsule by mouth in the morning and at bedtime. Patient taking differently: Take 1 capsule by mouth in the morning and at bedtime. Unknown strenght 10/11/20   Eustaquio Boyden, MD  [facility] injection  03/10/23   [provider]  triamcinolone (NASACORT) 55 MCG/ACT AERO nasal inhaler Place 1 spray into the nose daily as needed (sinus congestion). 11/17/21   Eustaquio Boyden, MD    Family History Family History  Problem Relation Age of Onset   Blindness Mother 80       ?stroke in eyes   Lung disease Father        coal miner   Arthritis Father    Colon cancer Neg Hx    Pancreatic cancer Neg Hx    Stomach cancer Neg Hx    Esophageal cancer Neg Hx    Rectal cancer Neg Hx    Cancer Neg Hx    Diabetes Neg Hx    CAD Neg Hx    Stroke Neg Hx     Social History Social History   Tobacco Use   Smoking status: Never   Smokeless tobacco: Never  Vaping Use   Vaping Use: Never used  Substance Use Topics   Alcohol use: Yes    Comment: occasional   Drug use: No     Allergies   Patient has no known allergies.   Review of Systems Review of Systems Per HPI  Physical Exam Triage Vital Signs ED Triage Vitals  Enc Vitals Group     BP 05/01/23 1118 (!) 146/66     Pulse Rate 05/01/23 1118 (!) 58     Resp 05/01/23 1118 18     Temp 05/01/23 1118 98 F (36.7 C)     Temp Source 05/01/23 1118 Oral      SpO2 05/01/23 1118 94 %     Weight 05/01/23 1116 147 lb (66.7 kg)     Height 05/01/23 1116 5\' 8"  (1.727 m)     Head Circumference --      Peak Flow --      Pain Score 05/01/23 1116 0     Pain Loc --      Pain Edu? --      Excl. in GC? --    No data found.  Updated Vital Signs BP (!) 148/68 (BP Location: Right  Arm)   Pulse (!) 58   Temp 98 F (36.7 C) (Oral)   Resp 18   Ht 5\' 8"  (1.727 m)   Wt 147 lb (66.7 kg)   SpO2 94%   BMI 22.35 kg/m   Visual Acuity Right Eye Distance:   Left Eye Distance:   Bilateral Distance:    Right Eye Near:   Left Eye Near:    Bilateral Near:     Physical Exam Constitutional:      General: He is not in acute distress.    Appearance: Normal appearance. He is not toxic-appearing or diaphoretic.  HENT:     Head: Normocephalic and atraumatic.  Eyes:     Extraocular Movements: Extraocular movements intact.     Conjunctiva/sclera: Conjunctivae normal.  Pulmonary:     Effort: Pulmonary effort is normal.  Skin:    Comments: Patient has approximately 2 inch in diameter area of very mild swelling and bruising discoloration that is circular present to the left mid thigh.  There is no abrasion or laceration.  No erythema or warmth noted.  Mild swelling that extends from the bruising discoloration as well.  Patient is neurovascularly intact, is able to bear weight, and has full range of motion of leg.  Neurological:     General: No focal deficit present.     Mental Status: He is alert and oriented to person, place, and time. Mental status is at baseline.  Psychiatric:        Mood and Affect: Mood normal.        Behavior: Behavior normal.        Thought Content: Thought content normal.        Judgment: Judgment normal.      UC Treatments / Results  Labs (all labs ordered are listed, but only abnormal results are displayed) Labs Reviewed  CBC    EKG   Radiology No results found.  Procedures Procedures (including critical care  time)  Medications Ordered in UC Medications - No data to display  Initial Impression / Assessment and Plan / UC Course  I have reviewed the triage vital signs and the nursing notes.  Pertinent labs & imaging results that were available during my care of the patient were reviewed by me and considered in my medical decision making (see chart for details).     Physical exam is not consistent with cellulitis.  It appears the patient has bruising discoloration.  Suggested x-ray of the leg but patient declined this wishing for his family medicine doctor to do it. Suspect bruising that may be due to taking Eliquis but will obtain CBC.  Advised supportive care and following up with family medicine doctor as soon as possible for recheck and reevaluation.  Patient was also advised of strict ER precautions.  Patient verbalized understanding and was agreeable with plan. Vitals appear baseline. Final Clinical Impressions(s) / UC Diagnoses   Final diagnoses:  Left leg pain  Contusion of left lower extremity, initial encounter     Discharge Instructions      Blood work is pending.  Will call if it is abnormal.  Please follow-up with your family medicine doctor as soon as possible to have recheck and reevaluation.  Go to the emergency department if symptoms persist or worsen.    ED Prescriptions   None    PDMP not reviewed this encounter.   Gustavus Bryant, [place] 05/01/23 1240    [address] [place] 05/01/23 1244

## 2023-05-01 NOTE — ED Triage Notes (Signed)
"  Infection on my upper left leg & thigh, I have think I have cellulitis and its going in the muscle and knee". Red/swelling. No previous injury to area. "In Feb. I had the same thing but lower part of left leg". No fever.

## 2023-05-02 ENCOUNTER — Telehealth: Payer: Self-pay

## 2023-05-02 LAB — CBC
Hematocrit: 35.6 % — ABNORMAL LOW (ref 37.5–51.0)
Hemoglobin: 11.5 g/dL — ABNORMAL LOW (ref 13.0–17.7)
MCH: 31.3 pg (ref 26.6–33.0)
MCHC: 32.3 g/dL (ref 31.5–35.7)
MCV: 97 fL (ref 79–97)
Platelets: 100 10*3/uL — CL (ref 150–450)
RBC: 3.68 x10E6/uL — ABNORMAL LOW (ref 4.14–5.80)
RDW: 13 % (ref 11.6–15.4)
WBC: 204.4 10*3/uL (ref 3.4–10.8)

## 2023-05-02 NOTE — Telephone Encounter (Signed)
Notified patient of information below per UC provider.  Patient will followup with Oncologist or office tomorrow "first thing" 05-03-2023 for more detailed results and advise.  B. Roten CMA

## 2023-05-02 NOTE — Telephone Encounter (Signed)
Attempted to contact the patient re: result below (per [facility], Provider)  (can you please call Keith Jordan and tell him to follow up with his oncologist tomorrow. and give strict ER precautions for any bleeding anywhere or any worsening symptoms)  Attempted to phone, no answer, voicemail full.  Will retry in PM.  B .Roten CMA

## 2023-05-02 NOTE — Telephone Encounter (Signed)
Keith Jordan from [facility] calling Urgent results: WBC (204.4) & Plt (alert at 100).   Sent to [facility] provider, results being faxed.  B. Roten CMA

## 2023-05-04 ENCOUNTER — Ambulatory Visit (INDEPENDENT_AMBULATORY_CARE_PROVIDER_SITE_OTHER): Payer: Medicare HMO | Admitting: Family Medicine

## 2023-05-04 ENCOUNTER — Encounter: Payer: Self-pay | Admitting: Family Medicine

## 2023-05-04 ENCOUNTER — Ambulatory Visit (INDEPENDENT_AMBULATORY_CARE_PROVIDER_SITE_OTHER)
Admission: RE | Admit: 2023-05-04 | Discharge: 2023-05-04 | Disposition: A | Discharge status: Home / Self Care | Payer: Medicare HMO | Source: Ambulatory Visit | Attending: Family Medicine | Admitting: Family Medicine

## 2023-05-04 VITALS — BP 136/68 | HR 64 | Temp 97.5°F | Ht 68.0 in | Wt 145.1 lb

## 2023-05-04 DIAGNOSIS — M79652 Pain in left thigh: Secondary | ICD-10-CM | POA: Diagnosis not present

## 2023-05-04 DIAGNOSIS — S7012XA Contusion of left thigh, initial encounter: Secondary | ICD-10-CM

## 2023-05-04 DIAGNOSIS — C8307 Small cell B-cell lymphoma, spleen: Secondary | ICD-10-CM | POA: Diagnosis not present

## 2023-05-04 DIAGNOSIS — D696 Thrombocytopenia, unspecified: Secondary | ICD-10-CM | POA: Insufficient documentation

## 2023-05-04 DIAGNOSIS — Z7901 Long term (current) use of anticoagulants: Secondary | ICD-10-CM | POA: Insufficient documentation

## 2023-05-04 NOTE — Patient Instructions (Signed)
Left leg color change and swelling most consistent with bruise or hematoma, possibly from un noticed minor trauma. Low platelets increase risk of bleeding.  Continue leg elevation, ice/heating pad whichever soothes better.  May continue tylenol 1000mg  two to three times daily as needed for pain.  This should continue improving over time  Xray today

## 2023-05-04 NOTE — Progress Notes (Signed)
Ph: [phone]       Fax: [phone]   Patient ID: Keith Jordan, male    DOB: 03-Feb-1941, 82 y.o.   MRN: [id number]  This visit was conducted in person.  BP 136/68   Pulse 64   Temp (!) 97.5 F (36.4 C) (Temporal)   Ht 5\' 8"  (1.727 m)   Wt 145 lb 2 oz (65.8 kg)   SpO2 95%   BMI 22.07 kg/m    CC: UCC f/u visit for L leg pain Subjective:   HPI: Keith Jordan is a 82 y.o. male presenting on 05/04/2023 for Hospitalization Follow-up (Seen on 05/01/23 at [facility], dx L leg pain; contusion of L lower extremity. Pt accompanied by )   Chest congestion resolved with guaifenesin, will cancel CXR.   Recent [facility] visit over weekend for left upper lateral leg pain that started earlier in the week. Found to have 2 inch diameter area of discoloration/bruising to left mid thigh. Denies inciting trauma/injury or fall. ?bruising related to eliquis use. CBC was overall stable with WBC 200s, Hgb 21.5, plt 100.   Notes pain and swelling to the area. Feels discomfort to the area when walking.  He's been taking tylenol for this.  In the mornings pain is the worst - has to use cane for ambulation No numbness/tingling.   He takes eliquis 5mg  BID for known atrial flutter followed by cardiology.   He did recently receive recent hyaluronic acid injection to RIGHT knee.   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 He completed weekly rituximab treatment 02/2021.      Relevant past medical, surgical, family and social history reviewed and updated as indicated. Interim medical history since our last visit reviewed. Allergies and medications reviewed and updated. Outpatient Medications Prior to Visit  Medication Sig Dispense Refill   acetaminophen (TYLENOL) 500 MG tablet Take 500 mg by mouth every 6 (six) hours as needed for mild pain.     Ascorbic Acid (VITAMIN C) 1000 MG tablet Take 500 mg by mouth 2 (two) times daily.     atorvastatin (LIPITOR) 10 MG tablet TAKE 1 TABLET BY MOUTH EVERY  DAY 90 tablet 3   carboxymethylcellulose (REFRESH PLUS) 0.5 % SOLN Place 1 drop into both eyes 2 (two) times daily as needed (dry eyes).     carvedilol (COREG) 6.25 MG tablet TAKE 1 TABLET BY MOUTH TWICE A DAY 180 tablet 3   ELIQUIS 5 MG TABS tablet TAKE 1 TABLET BY MOUTH TWICE A DAY 60 tablet 5   Multiple Vitamins-Minerals (MULTIVITAMIN WITH MINERALS) tablet Take 1 tablet by mouth daily. Unknown strenght     Multiple Vitamins-Minerals (PRESERVISION AREDS) CAPS Take 1 capsule by mouth in the morning and at bedtime. (Patient taking differently: Take 1 capsule by mouth in the morning and at bedtime. Unknown strenght) 180 capsule 3   Olopatadine HCl 0.2 % SOLN Apply 1 drop to eye daily. (Both) 2.5 mL 0   SHINGRIX injection      sodium chloride (OCEAN) 0.65 % SOLN nasal spray Place 1 spray into both nostrils as needed for congestion.     triamcinolone (NASACORT) 55 MCG/ACT AERO nasal inhaler Place 1 spray into the nose daily as needed (sinus congestion). 1 each    No facility-administered medications prior to visit.     Per HPI unless specifically indicated in ROS section below Review of Systems  Objective:  BP 136/68   Pulse 64   Temp (!) 97.5 F (36.4 C) (  Temporal)   Ht 5\' 8"  (1.727 m)   Wt 145 lb 2 oz (65.8 kg)   SpO2 95%   BMI 22.07 kg/m   Wt Readings from Last 3 Encounters:  05/04/23 145 lb 2 oz (65.8 kg)  05/01/23 147 lb (66.7 kg)  04/26/23 146 lb 2 oz (66.3 kg)      Physical Exam Vitals and nursing note reviewed.  Constitutional:      Appearance: Normal appearance.  Musculoskeletal:        General: Tenderness present.     Right lower leg: No edema.     Left lower leg: No edema.     Comments:  2+ DP bilaterally FROM bilateral hips without pain  FROM R knee Limited ROM in flexion of L knee s/p knee replacement  Skin:    General: Skin is warm and dry.     Findings: Bruising and ecchymosis present. No rash.          Comments: Bruising with swelling to lateral thigh  with more diffuse bruising distal to lateral mid thigh  Neurological:     Mental Status: He is alert.     Sensory: Sensation is intact.     Motor: Motor function is intact.     Coordination: Coordination is intact.     Gait: Gait is intact.  Psychiatric:        Mood and Affect: Mood normal.        Behavior: Behavior normal.       Results for orders placed or performed during the hospital encounter of 05/01/23  CBC  Result Value Ref Range   WBC 204.4 (HH) 3.4 - 10.8 x10E3/uL   RBC 3.68 (L) 4.14 - 5.80 x10E6/uL   Hemoglobin 11.5 (L) 13.0 - 17.7 g/dL   Hematocrit 81.1 (L) 91.4 - 51.0 %   MCV 97 79 - 97 fL   MCH 31.3 26.6 - 33.0 pg   MCHC 32.3 31.5 - 35.7 g/dL   RDW 78.2 95.6 - 21.3 %   Platelets 100 (LL) 150 - 450 x10E3/uL    Assessment & Plan:   Problem List Items Addressed This Visit     Splenic marginal zone b-cell lymphoma (HCC)   Thigh hematoma, left, initial encounter - Primary    Anticipate suffered unperceived trauma to left lateral thigh leading to development of small hematoma to area. He has been working out in yard more regularly Eliquis use, thrombocytopenia predisposes to this.  Reassurance provided, anticipated course of improvement reviewed.  Discussed pain regimen - may increase tylenol to 1000mg  TID PRN.  Update if not improving with this, or if recurrent spontaneous hematoma/ecchymosis to notify onc, cards, consider dropping eliquis dose and further evaluation.  In h/o splenic zone lypmhoma, will also check left femur films.  Pt agrees with plan.       Relevant Orders   DG FEMUR MIN 2 VIEWS LEFT   Chronic anticoagulation   Thrombocytopenia (HCC)     No orders of the defined types were placed in this encounter.   Orders Placed This Encounter  Procedures   DG FEMUR MIN 2 VIEWS LEFT    Standing Status:   Future    Number of Occurrences:   1    Standing Expiration Date:   05/03/2024    Order Specific Question:   Reason for Exam (SYMPTOM  OR DIAGNOSIS  REQUIRED)    Answer:   left lateral thigh pain with bruising    Order Specific Question:   Preferred  imaging location?    Answer:   [facility]    Patient Instructions  Left leg color change and swelling most consistent with bruise or hematoma, possibly from un noticed minor trauma. Low platelets increase risk of bleeding.  Continue leg elevation, ice/heating pad whichever soothes better.  May continue tylenol 1000mg  two to three times daily as needed for pain.  This should continue improving over time  Xray today   Follow up plan: No follow-ups on file.  Eustaquio Boyden, MD

## 2023-05-04 NOTE — Assessment & Plan Note (Addendum)
Anticipate suffered unperceived trauma to left lateral thigh leading to development of small hematoma to area. He has been working out in yard more regularly Eliquis use, thrombocytopenia predisposes to this.  Reassurance provided, anticipated course of improvement reviewed.  Discussed pain regimen - may increase tylenol to 1000mg  TID PRN.  Update if not improving with this, or if recurrent spontaneous hematoma/ecchymosis to notify onc, cards, consider dropping eliquis dose and further evaluation.  In h/o splenic zone lypmhoma, will also check left femur films.  Pt agrees with plan.

## 2023-05-09 ENCOUNTER — Ambulatory Visit (HOSPITAL_COMMUNITY)
Admission: EM | Admit: 2023-05-09 | Discharge: 2023-05-09 | Disposition: A | Discharge status: Home / Self Care | Payer: Medicare HMO

## 2023-05-09 ENCOUNTER — Encounter (HOSPITAL_COMMUNITY): Payer: Self-pay

## 2023-05-09 ENCOUNTER — Emergency Department (HOSPITAL_COMMUNITY): Payer: Medicare HMO

## 2023-05-09 ENCOUNTER — Emergency Department (HOSPITAL_COMMUNITY)
Admission: EM | Admit: 2023-05-09 | Discharge: 2023-05-09 | Disposition: A | Discharge status: Home / Self Care | Payer: Medicare HMO | Attending: Emergency Medicine | Admitting: Emergency Medicine

## 2023-05-09 ENCOUNTER — Other Ambulatory Visit: Payer: Self-pay

## 2023-05-09 ENCOUNTER — Encounter (HOSPITAL_COMMUNITY): Payer: Self-pay | Admitting: Pharmacy Technician

## 2023-05-09 DIAGNOSIS — R600 Localized edema: Secondary | ICD-10-CM | POA: Diagnosis not present

## 2023-05-09 DIAGNOSIS — E119 Type 2 diabetes mellitus without complications: Secondary | ICD-10-CM | POA: Diagnosis not present

## 2023-05-09 DIAGNOSIS — M25452 Effusion, left hip: Secondary | ICD-10-CM | POA: Diagnosis not present

## 2023-05-09 DIAGNOSIS — S8012XA Contusion of left lower leg, initial encounter: Secondary | ICD-10-CM | POA: Insufficient documentation

## 2023-05-09 DIAGNOSIS — W228XXA Striking against or struck by other objects, initial encounter: Secondary | ICD-10-CM | POA: Diagnosis not present

## 2023-05-09 DIAGNOSIS — Z7901 Long term (current) use of anticoagulants: Secondary | ICD-10-CM | POA: Diagnosis not present

## 2023-05-09 DIAGNOSIS — Y92007 Garden or yard of unspecified non-institutional (private) residence as the place of occurrence of the external cause: Secondary | ICD-10-CM | POA: Diagnosis not present

## 2023-05-09 DIAGNOSIS — S7012XD Contusion of left thigh, subsequent encounter: Secondary | ICD-10-CM

## 2023-05-09 LAB — CBC WITH DIFFERENTIAL/PLATELET
Abs Immature Granulocytes: 0.93 10*3/uL — ABNORMAL HIGH (ref 0.00–0.07)
Basophils Absolute: 0.1 10*3/uL (ref 0.0–0.1)
Basophils Relative: 0 %
Eosinophils Absolute: 0.5 10*3/uL (ref 0.0–0.5)
Eosinophils Relative: 0 %
HCT: 38.3 % — ABNORMAL LOW (ref 39.0–52.0)
Hemoglobin: 11.3 g/dL — ABNORMAL LOW (ref 13.0–17.0)
Immature Granulocytes: 0 %
Lymphocytes Relative: 93 %
Lymphs Abs: 210.2 10*3/uL — ABNORMAL HIGH (ref 0.7–4.0)
MCH: 29.1 pg (ref 26.0–34.0)
MCHC: 29.5 g/dL — ABNORMAL LOW (ref 30.0–36.0)
MCV: 98.7 fL (ref 80.0–100.0)
Monocytes Absolute: 8.5 10*3/uL — ABNORMAL HIGH (ref 0.1–1.0)
Monocytes Relative: 4 %
Neutro Abs: 6.6 10*3/uL (ref 1.7–7.7)
Neutrophils Relative %: 3 %
Platelets: 144 10*3/uL — ABNORMAL LOW (ref 150–400)
RBC: 3.88 MIL/uL — ABNORMAL LOW (ref 4.22–5.81)
RDW: 14.6 % (ref 11.5–15.5)
WBC: 226.9 10*3/uL (ref 4.0–10.5)
nRBC: 0 % (ref 0.0–0.2)

## 2023-05-09 LAB — COMPREHENSIVE METABOLIC PANEL
ALT: 11 U/L (ref 0–44)
AST: 25 U/L (ref 15–41)
Albumin: 3.6 g/dL (ref 3.5–5.0)
Alkaline Phosphatase: 81 U/L (ref 38–126)
Anion gap: 9 (ref 5–15)
BUN: 26 mg/dL — ABNORMAL HIGH (ref 8–23)
CO2: 26 mmol/L (ref 22–32)
Calcium: 8.9 mg/dL (ref 8.9–10.3)
Chloride: 102 mmol/L (ref 98–111)
Creatinine, Ser: 1.08 mg/dL (ref 0.61–1.24)
GFR, Estimated: >60 mL/min (ref >60)
Glucose, Bld: 161 mg/dL — ABNORMAL HIGH (ref 70–99)
Potassium: 4.4 mmol/L (ref 3.5–5.1)
Sodium: 137 mmol/L (ref 135–145)
Total Bilirubin: 0.8 mg/dL (ref 0.3–1.2)
Total Protein: 6.5 g/dL (ref 6.5–8.1)

## 2023-05-09 MED ORDER — HYDROCODONE-ACETAMINOPHEN 5-325 MG PO TABS
1.0000 | ORAL_TABLET | Freq: Once | ORAL | Status: AC
  Administered 2023-05-09: 1 via ORAL
  Filled 2023-05-09: qty 1

## 2023-05-09 MED ORDER — IOHEXOL 350 MG/ML SOLN
75.0000 mL | Freq: Once | INTRAVENOUS | Status: AC | PRN
  Administered 2023-05-09: 75 mL via INTRAVENOUS

## 2023-05-09 MED ORDER — OXYCODONE HCL 5 MG PO TABS: 5.0000 mg | ORAL_TABLET | Freq: Four times a day (QID) | ORAL | 0 refills | Status: DC | PRN

## 2023-05-09 NOTE — Discharge Instructions (Addendum)
Please go to the nearest emergency department for further evlauation of

## 2023-05-09 NOTE — Discharge Instructions (Addendum)
Thank you for allowing me to be a part of your care today.   Your imaging showed that you have a hematoma, or collection of blood, in your thigh muscle.  I spoke with our on-call orthopedic specialist who recommended light compression with an Ace wrap as well as applying ice to this area multiple times a day to help with swelling and to stop the bleeding.  Once you feel that you are no longer experiencing active swelling of your leg, recommend switching to heat on the area to help your body reabsorb the hematoma and further heal.  I have sent over a prescription for pain medicine to your pharmacy to help whenever you have severe pain.  If you have mild to moderate pain I recommend taking 1000 mg of Tylenol every 6-8 hours as needed.  Social work was contacted and they will help you obtain a walker for home use.  I want you to use this walker as a support while your leg is still healing.  Please call [facility] and schedule an outpatient follow-up appointment within 1 to 2 weeks.  I also recommend contacting your cardiologist to ask them about possible adjustment of your blood thinner dosage.  Return to the ED if you have sudden worsening of your symptoms, are unable to walk despite use of medicine and a walker, or if you have any new concerns.

## 2023-05-09 NOTE — ED Provider Notes (Addendum)
[facility]    CSN: [id number] Arrival date & time: 05/09/23  1418      History   Chief Complaint Chief Complaint  Patient presents with   Knee Pain    HPI Keith Jordan is a 82 y.o. male.   Patient with history of atrial fibrillation, chronic anticoagulation (Eloquis), leukocytosis,  presents to urgent care for evaluation of left sided thigh pain and swelling that initially started approximately 2 weeks ago after he was clearing some brush and doing some yard work in the backyard.  He states he struck the left upper thigh with an object when he was doing yard work and started bruising to the area.  He is on Eliquis and states he bruises easily.  He went to urgent care for evaluation of bruising and pain to the left upper thigh last week and then went to his primary care provider for further evaluation where an x-ray of the left thigh was performed and was negative.  He presents today with worsening pain, swelling, and bruising that has traveled from the left thigh all the way down the left leg and into the lateral aspect of the left knee.  There is a palpable area of swelling to the left thigh that is warm to touch without any redness or bruising.  Patient is experiencing significant tenderness with weightbearing on the left lower extremity which is very different from his baseline.  Patient is normally able to walk with a cane without difficulty and without limp, however has had worsening changes in gait related to pain over the last 2 weeks.  He is currently unable to bear weight on the left lower extremity without severe pain.  Denies further injuries to the left thigh, recent antibiotic/steroid use, and fevers.      Past Medical History:  Diagnosis Date   Abnormal breath sounds 10/11/2020   Acute sinusitis 01/29/2021   Advanced care planning/counseling discussion 09/01/2016   BPH (benign prostatic hyperplasia) 09/29/2018   Ear fullness, bilateral 04/03/2021   Health  maintenance examination 09/01/2016   Hyperlipidemia    Irregular heart beat 03/24/2021   Ischemic optic neuropathy 2006   [place] [facility]   Leukocytosis 01/30/2021   Severe s/p ER eval 01/2021   Medicare annual wellness visit, subsequent 10/04/2019   Nausea without vomiting 03/24/2021   Personal history of colonic adenoma 07/30/2008   Prediabetes 07/05/2016   A1c 6.5% 03/2014    Prediabetes 07/05/2016   A1c 6.5% 03/2014    Primary osteoarthritis of knee    bilateral s/p L knee replacement, receives R knee injections [name], [name])   Sebaceous cyst 01/30/2020   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    Patient Active Problem List   Diagnosis Date Noted   Thigh hematoma, left, initial encounter 05/04/2023   Chronic anticoagulation 05/04/2023   Thrombocytopenia (HCC) 05/04/2023   First degree AV block 04/26/2023   Chest discomfort 04/26/2023   Chronic venous insufficiency of lower extremity 02/12/2023   Cellulitis of left lower leg 02/05/2023   Allergic rhinitis 05/29/2022   Dizziness 05/29/2022   Right foot pain 05/26/2022   Cellulitis and abscess of right leg 04/13/2022   Insomnia 11/17/2021   Nocturia 11/17/2021   Elevated blood pressure reading 11/17/2021   Typical atrial flutter (HCC) 10/13/2021   Skin sensation disturbance 10/13/2021   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   Abnormal breath sounds 10/11/2020   Sebaceous cyst 01/30/2020   Medicare annual wellness  visit, subsequent 10/04/2019   BPH (benign prostatic hyperplasia) 09/29/2018   Health maintenance examination 09/01/2016   Advanced care planning/counseling discussion 09/01/2016   Type 2 diabetes mellitus with other specified complication (HCC) 07/05/2016   Hyperlipidemia associated with type 2 diabetes mellitus (HCC)    Primary osteoarthritis of knee    Ischemic optic neuropathy    Personal history of colonic adenoma 07/30/2008    Past Surgical History:  Procedure Laterality  Date   A-FLUTTER ABLATION N/A 07/11/2021   Procedure: A-FLUTTER ABLATION;  Surgeon: [name], MD;  Location: [facility];  Service: Cardiovascular;  Laterality: N/A;   COLONOSCOPY  12/2013   no polyps, no rpt due [name])   CYST EXCISION  01/2020   TOTAL KNEE ARTHROPLASTY Left 2005   [name]       Home Medications    Prior to Admission medications   Medication Sig Start Date End Date Taking? Authorizing Provider  acetaminophen (TYLENOL) 500 MG tablet Take 500 mg by mouth every 6 (six) hours as needed for mild pain.   Yes [provider]  Ascorbic Acid (VITAMIN C) 1000 MG tablet Take 500 mg by mouth 2 (two) times daily.   Yes [provider]  atorvastatin (LIPITOR) 10 MG tablet TAKE 1 TABLET BY MOUTH EVERY DAY 10/21/22  Yes Eustaquio Boyden, MD  carboxymethylcellulose (REFRESH PLUS) 0.5 % SOLN Place 1 drop into both eyes 2 (two) times daily as needed (dry eyes).   Yes [provider]  carvedilol (COREG) 6.25 MG tablet TAKE 1 TABLET BY MOUTH TWICE A DAY 01/27/23  Yes Tobb, [name], DO  ELIQUIS 5 MG TABS tablet TAKE 1 TABLET BY MOUTH TWICE A DAY 04/19/23  Yes Tobb, [name], DO  Multiple Vitamins-Minerals (MULTIVITAMIN WITH MINERALS) tablet Take 1 tablet by mouth daily. Unknown strenght   Yes [provider]  Multiple Vitamins-Minerals (PRESERVISION AREDS) CAPS Take 1 capsule by mouth in the morning and at bedtime. Patient taking differently: Take 1 capsule by mouth in the morning and at bedtime. Unknown strenght 10/11/20  Yes Eustaquio Boyden, MD  Olopatadine HCl 0.2 % SOLN Apply 1 drop to eye daily. (Both) 05/29/22  Yes Eustaquio Boyden, MD  [facility] injection  03/10/23  Yes [provider]  sodium chloride (OCEAN) 0.65 % SOLN nasal spray Place 1 spray into both nostrils as needed for congestion.   Yes [provider]  triamcinolone (NASACORT) 55 MCG/ACT AERO nasal inhaler Place 1 spray into the nose daily as needed (sinus  congestion). 11/17/21  Yes Eustaquio Boyden, MD    Family History Family History  Problem Relation Age of Onset   Blindness Mother 62       ?stroke in eyes   Lung disease Father        coal miner   Arthritis Father    Colon cancer Neg Hx    Pancreatic cancer Neg Hx    Stomach cancer Neg Hx    Esophageal cancer Neg Hx    Rectal cancer Neg Hx    Cancer Neg Hx    Diabetes Neg Hx    CAD Neg Hx    Stroke Neg Hx     Social History Social History   Tobacco Use   Smoking status: Never   Smokeless tobacco: Never  Vaping Use   Vaping Use: Never used  Substance Use Topics   Alcohol use: Yes    Comment: occasional   Drug use: No     Allergies   Patient has  no known allergies.   Review of Systems Review of Systems Per HPI  Physical Exam Triage Vital Signs ED Triage Vitals  Enc Vitals Group     BP 05/09/23 1439 (!) 181/75     Pulse Rate 05/09/23 1439 72     Resp --      Temp 05/09/23 1439 98.6 F (37 C)     Temp Source 05/09/23 1439 Oral     SpO2 05/09/23 1439 91 %     Weight --      Height --      Head Circumference --      Peak Flow --      Pain Score 05/09/23 1437 8     Pain Loc --      Pain Edu? --      Excl. in GC? --    No data found.  Updated Vital Signs BP (!) 181/75 (BP Location: Left Arm)   Pulse 72   Temp 98.6 F (37 C) (Oral)   SpO2 91%   Visual Acuity Right Eye Distance:   Left Eye Distance:   Bilateral Distance:    Right Eye Near:   Left Eye Near:    Bilateral Near:     Physical Exam Vitals and nursing note reviewed.  Constitutional:      Appearance: He is not ill-appearing or toxic-appearing.     Comments: Patient seated in wheelchair in comfortable position.  HENT:     Head: Normocephalic and atraumatic.     Right Ear: Hearing and external ear normal.     Left Ear: Hearing and external ear normal.     Nose: Nose normal.     Mouth/Throat:     Lips: Pink.  Eyes:     General: Lids are normal. Vision grossly intact. Gaze  aligned appropriately.     Extraocular Movements: Extraocular movements intact.     Conjunctiva/sclera: Conjunctivae normal.  Pulmonary:     Effort: Pulmonary effort is normal.  Musculoskeletal:     Cervical back: Neck supple.     Left upper leg: Swelling and tenderness present.     Right knee: Normal.     Left knee: Decreased range of motion. Tenderness present.     Comments: Significant tenderness with weightbearing of the left lower extremity and with flexion/extension of the left knee.   Skin:    General: Skin is warm and dry.     Capillary Refill: Capillary refill takes less than 2 seconds.     Findings: Bruising (see images below) present. No rash.          Comments: Palpable hematoma present to the left lateral thigh.  No overlying erythema to the hematoma, however area feels warm to palpation.  See images of the left lower extremity below for further details.  Neurological:     General: No focal deficit present.     Mental Status: He is alert and oriented to person, place, and time. Mental status is at baseline.     Cranial Nerves: No dysarthria or facial asymmetry.  Psychiatric:        Mood and Affect: Mood normal.        Speech: Speech normal.        Behavior: Behavior normal.        Thought Content: Thought content normal.        Judgment: Judgment normal.      UC Treatments / Results  Labs (all labs ordered are listed, but only abnormal results are  displayed) Labs Reviewed - No data to display  EKG   Radiology No results found.  Procedures Procedures (including critical care time)  Medications Ordered in UC Medications - No data to display  Initial Impression / Assessment and Plan / UC Course  I have reviewed the triage vital signs and the nursing notes.  Pertinent labs & imaging results that were available during my care of the patient were reviewed by me and considered in my medical decision making (see chart for details).   1.  Thigh hematoma,  swelling of left thigh Unclear etiology of patient's symptoms.  Reviewed blood work from most recent visit showing significant, however stable leukocytosis with white blood cell count of 208.  He has not missed any doses of his Eliquis recently.  He is neurovascularly intact distally to pain, however with significantly unsteady gait secondary to severe pain of the left upper thigh with associated hematoma.  X-ray negative for bony abnormality.  Symptoms have worsened significantly over the last 2 to 3 days and therefore I am concerned he may be developing worsening/growing hematoma to the left thigh. I would like for him to go to the nearest ER for further workup and evaluation of this to rule out clotting/infectious etiology as well as growing hematoma causing worsening symptoms.   Son at bedside. Patient and son agreeable with plan. Discharged to ER from urgent care in stable condition with CMA via wheelchair to the ED.  Final Clinical Impressions(s) / UC Diagnoses   Final diagnoses:  Thigh hematoma, left, subsequent encounter  Swelling of joint of pelvic region or thigh, left     Discharge Instructions      Please go to the nearest emergency department for further evlauation of     ED Prescriptions   None    PDMP not reviewed this encounter.   Carlisle Beers, FNP 05/09/23 1629    Carlisle Beers, FNP 05/09/23 2224

## 2023-05-09 NOTE — ED Triage Notes (Signed)
Pt here from UC for possible infection to LLE. Pt with decreased mobility due to the pain.

## 2023-05-09 NOTE — ED Provider Notes (Signed)
[name] Provider Note   CSN: [id number] Arrival date & time: 05/09/23  1538     History  Chief Complaint  Patient presents with   Leg Pain    Keith Jordan is a 82 y.o. male with extensive past medical history including splenic marginal zone b-cell lymphoma, hyperlipidemia, type 2 diabetes, atrial flutter on chronic anticoagulation, chronic venous insuffiencey of lower extremity, thrombocytopenia presents to the ED from urgent care due to concern for infection in his left leg.  Patient began having pain and swelling to his left thigh 2 weeks ago while doing yard work.  He believes he struck his left leg on something and states he bruises easily due to taking Eliquis.  Patient has been evaluated and had negative x-ray.  He presents today with worsening pain, swelling, and inability to bear weight without severe pain.  He states he has also not been able to bend his knee since Thursday.  Denies fever, chills, dizziness, light-headedness, numbness or weakness, syncope.       Home Medications Prior to Admission medications   Medication Sig Start Date End Date Taking? Authorizing Provider  acetaminophen (TYLENOL) 500 MG tablet Take 500 mg by mouth every 6 (six) hours as needed for mild pain.    [provider]  Ascorbic Acid (VITAMIN C) 1000 MG tablet Take 500 mg by mouth 2 (two) times daily.    [provider]  atorvastatin (LIPITOR) 10 MG tablet TAKE 1 TABLET BY MOUTH EVERY DAY 10/21/22   Eustaquio Boyden, MD  carboxymethylcellulose (REFRESH PLUS) 0.5 % SOLN Place 1 drop into both eyes 2 (two) times daily as needed (dry eyes).    [provider]  carvedilol (COREG) 6.25 MG tablet TAKE 1 TABLET BY MOUTH TWICE A DAY 01/27/23   Tobb, [name], DO  ELIQUIS 5 MG TABS tablet TAKE 1 TABLET BY MOUTH TWICE A DAY 04/19/23   Tobb, [name], DO  Multiple Vitamins-Minerals (MULTIVITAMIN WITH MINERALS) tablet Take 1 tablet by mouth  daily. Unknown strenght    [provider]  Multiple Vitamins-Minerals (PRESERVISION AREDS) CAPS Take 1 capsule by mouth in the morning and at bedtime. Patient taking differently: Take 1 capsule by mouth in the morning and at bedtime. Unknown strenght 10/11/20   Eustaquio Boyden, MD  Olopatadine HCl 0.2 % SOLN Apply 1 drop to eye daily. (Both) 05/29/22   Eustaquio Boyden, MD  [facility] injection  03/10/23   [provider]  sodium chloride (OCEAN) 0.65 % SOLN nasal spray Place 1 spray into both nostrils as needed for congestion.    [provider]  triamcinolone (NASACORT) 55 MCG/ACT AERO nasal inhaler Place 1 spray into the nose daily as needed (sinus congestion). 11/17/21   Eustaquio Boyden, MD      Allergies    Patient has no known allergies.    Review of Systems   Review of Systems  Constitutional:  Negative for chills and fever.  Musculoskeletal:  Positive for arthralgias, gait problem and joint swelling.  Neurological:  Negative for dizziness, syncope, weakness, light-headedness and numbness.  Hematological:  Bruises/bleeds easily.    Physical Exam Updated Vital Signs BP (!) 148/65 (BP Location: Right Arm)   Pulse 70   Temp 98.4 F (36.9 C) (Oral)   Resp 17   SpO2 95%  Physical Exam Vitals and nursing note reviewed.  Constitutional:      General: He is not in acute distress.    Appearance: Normal appearance. He  is not ill-appearing or diaphoretic.  Cardiovascular:     Rate and Rhythm: Normal rate and regular rhythm.  Pulmonary:     Effort: Pulmonary effort is normal.  Musculoskeletal:     Left upper leg: Swelling and tenderness present. No deformity or bony tenderness.     Left knee: Swelling and ecchymosis present. Decreased range of motion. Tenderness present. Normal pulse.       Legs:     Comments: Firm, tender area to left lateral thigh without erythema.  Mildly increased warmth when compared to right leg.  Ecchymosis along lateral left  leg that appears to be healing.  Swelling extends from thigh distally past the left knee.  Patient with severely reduced ROM of left knee.  LLE neurovascularly intact.    Neurological:     Mental Status: He is alert. Mental status is at baseline.  Psychiatric:        Mood and Affect: Mood normal.        Behavior: Behavior normal.     ED Results / Procedures / Treatments   Labs (all labs ordered are listed, but only abnormal results are displayed) Labs Reviewed  COMPREHENSIVE METABOLIC PANEL - Abnormal; Notable for the following components:      Result Value   Glucose, Bld 161 (*)    BUN 26 (*)    All other components within normal limits  CBC WITH DIFFERENTIAL/PLATELET - Abnormal; Notable for the following components:   WBC 226.9 (*)    RBC 3.88 (*)    Hemoglobin 11.3 (*)    HCT 38.3 (*)    MCHC 29.5 (*)    Platelets 144 (*)    Lymphs Abs 210.2 (*)    Monocytes Absolute 8.5 (*)    Abs Immature Granulocytes 0.93 (*)    All other components within normal limits  PATHOLOGIST SMEAR REVIEW    EKG None  Radiology No results found.  Procedures Procedures    Medications Ordered in ED Medications  HYDROcodone-acetaminophen (NORCO/VICODIN) 5-325 MG per tablet 1 tablet (has no administration in time range)    ED Course/ Medical Decision Making/ A&P                             Medical Decision Making Amount and/or Complexity of Data Reviewed Labs: ordered. Radiology: ordered.  Risk Prescription drug management.   This patient presents to the ED with chief complaint(s) of left leg swelling and pain with inability to bear weight with pertinent past medical history of chronic anticoagulation, splenic marginal zone b-cell lymphoma, hyperlipidemia, thrombocytopenia.  The complaint involves an extensive differential diagnosis and also carries with it a high risk of complications and morbidity.    The differential diagnosis includes hematoma, compartment syndrome,  pyomyositis, cellulitis, abscess  The initial plan is to obtain baseline labs, perform bedside ultrasound of thigh  Additional history obtained: Records reviewed  - patient has been evaluated by urgent care and family medicine doctor for hematoma.  X-rays were performed and were negative for acute findings.  Patient returned to UC today and was referred to ED for further evaluation due to concern for infection.   Initial Assessment:   Exam significant for firm, tender area to the left lateral thigh without obvious erythema.  Mildly increased warmth when compared to right leg.  Ecchymosis along lateral left leg that appears to be healing.  Swelling extends from thigh distally past the left knee.  Patient with severely reduced  ROM of left knee due to swelling and pain.  LLE neurovascularly intact.  DP pulse is 2+.    Independent ECG/labs interpretation:  The following labs were independently interpreted:  CBC with critically high WBC, which is not abnormal for patient given his b-cell lymphoma, however, it has increased from 204 (lab work done 8 days prior).  Thrombocytopenia appears somewhat improved.  Metabolic panel without electrolyte abnormality.  Normal LFTs and renal function.   Independent visualization and interpretation of imaging: Bedside ultrasound was performed with attending, Gloris Manchester, which showed a loculated mass in the left thigh concerning for infection.  CT ordered for further evaluation.  CT lower left extremity, which revealed suspected hematoma in the left vastus intermedius with overlying soft tissue edema.    Treatment and Reassessment: Patient given oral pain medicine with improvement in pain symptoms.   Consultations obtained: I requested consultation with on-call orthopedics provider and spoke with Dr. Charlann Boxer who recommended loose ACE wrap for light compression and apply ice for vasoconstriction.   Disposition:   Discussed orthopedics recommendations and hospital  admission versus discharge home with patient.  Patient states that he feels that if his pain is controlled, he is able to bear weight on his leg.  Patient does not wish to be admitted to the hospital at this time.  Will consult with social work to help provide patient with a walker for increased ability when walking at home.  Advised patient he will need to follow-up with orthopedics in 1 to 2 weeks.  Recommended patient contact his cardiologist regarding his blood thinner use and possible medication adjustment.  The patient has been appropriately medically screened and/or stabilized in the ED. I have low suspicion for any other emergent medical condition which would require further screening, evaluation or treatment in the ED or require inpatient management. At time of discharge the patient is hemodynamically stable and in no acute distress. I have discussed work-up results and diagnosis with patient and answered all questions. Patient is agreeable with discharge plan. We discussed strict return precautions for returning to the emergency department and they verbalized understanding.            Final Clinical Impression(s) / ED Diagnoses Final diagnoses:  None    Rx / DC Orders ED Discharge Orders     None         Lenard Simmer, PA-C 05/09/23 2129    Gloris Manchester, MD 05/12/23 0005

## 2023-05-09 NOTE — ED Notes (Signed)
Patient is being discharged from the Urgent Care and sent to the Emergency Department via POV . Per Juliet Rude NP, patient is in need of higher level of care due to Leg pain with possible infection. Patient is aware and verbalizes understanding of plan of care.  Vitals:   05/09/23 1439  BP: (!) 181/75  Pulse: 72  Temp: 98.6 F (37 C)  SpO2: 91%

## 2023-05-09 NOTE — ED Triage Notes (Addendum)
Patient having pressure and pain in the left knee for 2 weeks. Onset Tuesday pain getting worse and unable to bend the knee. Was seen last Saturday at urgent care for the knee, then had x-rays at primary, nothing found.   No known falls or injuries. No history of issues with the left knee.

## 2023-05-10 ENCOUNTER — Telehealth: Payer: Self-pay | Admitting: *Deleted

## 2023-05-10 DIAGNOSIS — Z9181 History of falling: Secondary | ICD-10-CM

## 2023-05-10 NOTE — Telephone Encounter (Signed)
Keith Cohn, RN, BSN, [place] [phone] Pt qualifies for DME [facility]) rolling walker.  DME  ordered through [employer].  Jermaine of [employer] notified to deliver DME to pt home.

## 2023-05-11 ENCOUNTER — Telehealth: Payer: Self-pay | Admitting: Cardiology

## 2023-05-11 LAB — PATHOLOGIST SMEAR REVIEW

## 2023-05-11 NOTE — Telephone Encounter (Signed)
Patient is aware that he needs to follow up with PCP

## 2023-05-11 NOTE — Telephone Encounter (Signed)
Pt c/o medication issue:  1. Name of Medication:   ELIQUIS 5 MG TABS tablet    2. How are you currently taking this medication (dosage and times per day)? As prescribed   3. Are you having a reaction (difficulty breathing--STAT)? Possibly   4. What is your medication issue? Patient states that he was at the hospital recently and seems to have a hematoma in left leg and he is wondering if this medication could be the cause. Please advise.

## 2023-05-11 NOTE — Telephone Encounter (Signed)
Left side of his left thigh hematoma for about 2 1/2 weeks.  He states getting worse.  States cannot move his leg due to pain. See his ED visit.  Had some swelling to leg and uses heat which has helped.  Just states pain hurts more and cannot bend the knee because of the pressure in the muscle. Talking tylenol and oxycodone for pain. Using heat in 20 minute increment. He elevates as able . Overall he is improving, though stated he was not.  It is the pain which was addressed at ER.   Should patient follow up with [place] or PCP? He states he cannot walk as yet so will need to be a while out for appt.  Any recommendation for this until appt?

## 2023-06-25 ENCOUNTER — Other Ambulatory Visit: Payer: Self-pay | Admitting: *Deleted

## 2023-06-25 ENCOUNTER — Other Ambulatory Visit: Payer: Self-pay

## 2023-06-25 ENCOUNTER — Inpatient Hospital Stay: Payer: Medicare HMO | Attending: Hematology and Oncology

## 2023-06-25 DIAGNOSIS — C8307 Small cell B-cell lymphoma, spleen: Secondary | ICD-10-CM

## 2023-06-25 DIAGNOSIS — Z8572 Personal history of non-Hodgkin lymphomas: Secondary | ICD-10-CM | POA: Diagnosis not present

## 2023-06-25 DIAGNOSIS — D696 Thrombocytopenia, unspecified: Secondary | ICD-10-CM | POA: Diagnosis not present

## 2023-06-25 LAB — CMP (CANCER CENTER ONLY)
ALT: 8 U/L (ref 0–44)
AST: 21 U/L (ref 15–41)
Albumin: 3.5 g/dL (ref 3.5–5.0)
Alkaline Phosphatase: 81 U/L (ref 38–126)
Anion gap: 5 (ref 5–15)
BUN: 28 mg/dL — ABNORMAL HIGH (ref 8–23)
CO2: 31 mmol/L (ref 22–32)
Calcium: 9 mg/dL (ref 8.9–10.3)
Chloride: 104 mmol/L (ref 98–111)
Creatinine: 1.15 mg/dL (ref 0.61–1.24)
GFR, Estimated: >60 mL/min (ref >60)
Glucose, Bld: 141 mg/dL — ABNORMAL HIGH (ref 70–99)
Potassium: 4.1 mmol/L (ref 3.5–5.1)
Sodium: 140 mmol/L (ref 135–145)
Total Bilirubin: 0.5 mg/dL (ref 0.3–1.2)
Total Protein: 6.4 g/dL — ABNORMAL LOW (ref 6.5–8.1)

## 2023-06-25 LAB — CBC WITH DIFFERENTIAL (CANCER CENTER ONLY)
Abs Immature Granulocytes: 0.46 10*3/uL — ABNORMAL HIGH (ref 0.00–0.07)
Basophils Absolute: 0.1 10*3/uL (ref 0.0–0.1)
Basophils Relative: 0 %
Eosinophils Absolute: 0.9 10*3/uL — ABNORMAL HIGH (ref 0.0–0.5)
Eosinophils Relative: 1 %
HCT: 33.7 % — ABNORMAL LOW (ref 39.0–52.0)
Hemoglobin: 10.6 g/dL — ABNORMAL LOW (ref 13.0–17.0)
Immature Granulocytes: 0 %
Lymphocytes Relative: 94 %
Lymphs Abs: 182.2 10*3/uL — ABNORMAL HIGH (ref 0.7–4.0)
MCH: 31.2 pg (ref 26.0–34.0)
MCHC: 31.5 g/dL (ref 30.0–36.0)
MCV: 99.1 fL (ref 80.0–100.0)
Monocytes Absolute: 5.4 10*3/uL — ABNORMAL HIGH (ref 0.1–1.0)
Monocytes Relative: 3 %
Neutro Abs: 3.2 10*3/uL (ref 1.7–7.7)
Neutrophils Relative %: 2 %
Platelet Count: 118 10*3/uL — ABNORMAL LOW (ref 150–400)
RBC: 3.4 MIL/uL — ABNORMAL LOW (ref 4.22–5.81)
RDW: 15.7 % — ABNORMAL HIGH (ref 11.5–15.5)
Smear Review: NORMAL
WBC Count: 192.2 10*3/uL (ref 4.0–10.5)
nRBC: 0 % (ref 0.0–0.2)

## 2023-06-25 LAB — LACTATE DEHYDROGENASE: LDH: 146 U/L (ref 98–192)

## 2023-06-30 ENCOUNTER — Telehealth: Payer: Self-pay

## 2023-06-30 DIAGNOSIS — E1169 Type 2 diabetes mellitus with other specified complication: Secondary | ICD-10-CM

## 2023-06-30 DIAGNOSIS — Z96652 Presence of left artificial knee joint: Secondary | ICD-10-CM

## 2023-06-30 DIAGNOSIS — C8307 Small cell B-cell lymphoma, spleen: Secondary | ICD-10-CM

## 2023-06-30 DIAGNOSIS — Z7901 Long term (current) use of anticoagulants: Secondary | ICD-10-CM

## 2023-06-30 DIAGNOSIS — D696 Thrombocytopenia, unspecified: Secondary | ICD-10-CM

## 2023-06-30 NOTE — Telephone Encounter (Signed)
[phone] Patient is coming for the extraction on Monday 7.8.24 Please fax form to [phone]

## 2023-06-30 NOTE — Telephone Encounter (Signed)
Received faxed form from Eda Keys, DMD of [facility]. Pt will receive dental tx under general anesthesia which may include x-rays, cleaning and teeth extraction. They are asking about pt's Eliquis.   Does pt need pre-op exam? Placed form in Dr. Timoteo Expose box.

## 2023-07-02 NOTE — Telephone Encounter (Signed)
Faxed form.

## 2023-07-02 NOTE — Telephone Encounter (Addendum)
Received request from dentist for anticoagulant management around upcoming surgical procedure: 1 dental extraction under general anesthesia.   Current blood thinner: eliquis 5mg  BID through cardiology Indication for anticoagulation: typical atrial flutter Bleeding risk of procedure: low  Recommend: will forward request to cardiologist Dr Servando Salina for anticoagulant recommendations perioperatively.   H/o total knee replacement 2005 [facility]). Don't think he needs abx for SBE ppx  H/o splenic zone b cell lymphoma, stable. Lab Results  Component Value Date   WBC 192.2 (HH) 06/25/2023   HGB 10.6 (L) 06/25/2023   HCT 33.7 (L) 06/25/2023   MCV 99.1 06/25/2023   PLT 118 (L) 06/25/2023   H/o T2DM non insulin dependent, stable.  Lab Results  Component Value Date   HGBA1C 6.2 (A) 04/26/2023     Form filled and in Lisa's box

## 2023-07-05 ENCOUNTER — Telehealth: Payer: Self-pay | Admitting: Cardiology

## 2023-07-05 NOTE — Telephone Encounter (Signed)
   Pre-operative Risk Assessment    Patient Name: Keith Jordan  DOB: 09-06-1941 MRN: [id number]     Request for Surgical Clearance    Procedure:  Dental Extraction - Amount of Teeth to be Pulled:  1  Date of Surgery:  Clearance 07/06/23                                 Surgeon:  Dr. Eda Keys  Surgeon's Group or Practice Name:  [facility]  Phone number:  [phone] Fax number:  [phone]   Type of Clearance Requested:   - Medical  - Pharmacy:  Hold Apixaban (Eliquis) Need to know how many days to hold    Type of Anesthesia:  Local    Additional requests/questions:    Signed, April Henson   07/05/2023, 12:46 PM

## 2023-07-05 NOTE — Telephone Encounter (Signed)
   Patient Name: Keith Jordan  DOB: [birth year] MRN: [id number]  Primary Cardiologist: Thomasene Ripple, DO  Chart reviewed as part of pre-operative protocol coverage.   Dental extractions of 1-2 teeth are considered low risk procedures per guidelines and generally do not require any specific cardiac clearance. It is also generally accepted that for extractions of 1-2 teeth and dental cleanings, there is no need to interrupt blood thinner therapy.  SBE prophylaxis is not required for the patient from a cardiac standpoint.  I will route this recommendation to the requesting party via Epic fax function and remove from pre-op pool.  Please call with questions.  Carlos Levering, NP 07/05/2023, 2:34 PM

## 2023-07-07 ENCOUNTER — Telehealth: Payer: Self-pay | Admitting: Cardiology

## 2023-07-07 NOTE — Telephone Encounter (Signed)
Patient called and wanted to know if he could cut his medication Eliquis 5 MG in half.

## 2023-07-07 NOTE — Telephone Encounter (Signed)
Spoke to the pt, explained Dr. Servando Salina advise:   Please have him hold the Eliquis today and call the dental office to be seen. He needs to have the dentis give some direction on when to restart given they  just did a recent procedure on him    Pt voiced understanding

## 2023-07-07 NOTE — Telephone Encounter (Signed)
Pt called he had tooth extraction 24 hours ago, pt sated took his Eliquis an hour after his dental appointment  and started bleeding  an hour after the dental appointment. Pt stated he had a gauze on the site and he is currently not bleeding. However, when he removes the gauze he will start to bleed ( trickling ). Advised pt to contact the dental office. Pt will like to know if he can take 2.5 mg ( half ) for 2 days or hold the medication for 2 days. Will forward to MD and nurse for advise.

## 2023-08-12 ENCOUNTER — Encounter: Payer: Self-pay | Admitting: Family Medicine

## 2023-08-12 ENCOUNTER — Ambulatory Visit (INDEPENDENT_AMBULATORY_CARE_PROVIDER_SITE_OTHER): Payer: Medicare HMO | Admitting: Family Medicine

## 2023-08-12 VITALS — BP 120/60 | HR 64 | Temp 98.2°F | Ht 68.0 in | Wt 139.2 lb

## 2023-08-12 DIAGNOSIS — L03116 Cellulitis of left lower limb: Secondary | ICD-10-CM

## 2023-08-12 DIAGNOSIS — L02416 Cutaneous abscess of left lower limb: Secondary | ICD-10-CM | POA: Diagnosis not present

## 2023-08-12 MED ORDER — DOXYCYCLINE HYCLATE 100 MG PO TABS
100.0000 mg | ORAL_TABLET | Freq: Two times a day (BID) | ORAL | 0 refills | Status: DC
Start: 2023-08-12 — End: 2024-08-31

## 2023-08-12 NOTE — Progress Notes (Signed)
Patient ID: Keith Jordan, male    DOB: 1941/02/10, 82 y.o.   MRN: [id number]  This visit was conducted in person.  BP 120/60 (BP Location: Left Arm, Patient Position: Sitting, Cuff Size: Normal)   Pulse 64   Temp 98.2 F (36.8 C) (Temporal)   Ht 5\' 8"  (1.727 m)   Wt 139 lb 4 oz (63.2 kg)   SpO2 96%   BMI 21.17 kg/m    CC:  Chief Complaint  Patient presents with   SKIN INFECTION    Left Upper Leg    Subjective:   HPI: Keith Jordan is a 82 y.o. male patient of Dr. Sharen Hones with history of type 2  diabetes with B-cell lymphoma, immunosuppression, chronic venous insufficiency of lower extremities presenting on 08/12/2023 for SKIN INFECTION (Left Upper Leg)  Appears in the past he had left lower extremity cellulitis in February 2024 treated with doxycycline and cephalexin. Cellulitis of right leg April 2023   Today noted  a red bump on left upper leg at left outer leg in last week.  Redness has spread, area is sore.  No fever, no flu like symptoms.   Using warm compresses several time a day.  Reviewed last CBC June 28 with cancer center White blood cells stable at 192     Relevant past medical, surgical, family and social history reviewed and updated as indicated. Interim medical history since our last visit reviewed. Allergies and medications reviewed and updated. Outpatient Medications Prior to Visit  Medication Sig Dispense Refill   acetaminophen (TYLENOL) 500 MG tablet Take 500 mg by mouth every 6 (six) hours as needed for mild pain.     Ascorbic Acid (VITAMIN C) 1000 MG tablet Take 500 mg by mouth 2 (two) times daily.     atorvastatin (LIPITOR) 10 MG tablet TAKE 1 TABLET BY MOUTH EVERY DAY 90 tablet 3   carboxymethylcellulose (REFRESH PLUS) 0.5 % SOLN Place 1 drop into both eyes 2 (two) times daily as needed (dry eyes).     carvedilol (COREG) 6.25 MG tablet TAKE 1 TABLET BY MOUTH TWICE A DAY 180 tablet 3   ELIQUIS 5 MG TABS tablet TAKE 1 TABLET BY MOUTH TWICE A  DAY 60 tablet 5   Multiple Vitamins-Minerals (MULTIVITAMIN WITH MINERALS) tablet Take 1 tablet by mouth daily. Unknown strenght     Multiple Vitamins-Minerals (PRESERVISION AREDS) CAPS Take 1 capsule by mouth in the morning and at bedtime. 180 capsule 3   Olopatadine HCl 0.2 % SOLN Apply 1 drop to eye daily. (Both) 2.5 mL 0   SHINGRIX injection      sodium chloride (OCEAN) 0.65 % SOLN nasal spray Place 1 spray into both nostrils as needed for congestion.     triamcinolone (NASACORT) 55 MCG/ACT AERO nasal inhaler Place 1 spray into the nose daily as needed (sinus congestion). 1 each    oxyCODONE (OXY IR/ROXICODONE) 5 MG immediate release tablet Take 1 tablet (5 mg total) by mouth every 6 (six) hours as needed for severe pain. 15 tablet 0   No facility-administered medications prior to visit.     Per HPI unless specifically indicated in ROS section below Review of Systems  Constitutional:  Negative for fatigue and fever.  HENT:  Negative for ear pain.   Eyes:  Negative for pain.  Respiratory:  Negative for cough and shortness of breath.   Cardiovascular:  Negative for chest pain, palpitations and leg swelling.  Gastrointestinal:  Negative for abdominal pain.  Genitourinary:  Negative for dysuria.  Musculoskeletal:  Negative for arthralgias.  Skin:  Positive for rash.  Neurological:  Negative for syncope, light-headedness and headaches.  Psychiatric/Behavioral:  Negative for dysphoric mood.    Objective:  BP 120/60 (BP Location: Left Arm, Patient Position: Sitting, Cuff Size: Normal)   Pulse 64   Temp 98.2 F (36.8 C) (Temporal)   Ht 5\' 8"  (1.727 m)   Wt 139 lb 4 oz (63.2 kg)   SpO2 96%   BMI 21.17 kg/m   Wt Readings from Last 3 Encounters:  08/12/23 139 lb 4 oz (63.2 kg)  05/09/23 147 lb (66.7 kg)  05/04/23 145 lb 2 oz (65.8 kg)      Physical Exam Constitutional:      Appearance: He is well-developed.  HENT:     Head: Normocephalic.     Right Ear: Hearing normal.      Left Ear: Hearing normal.     Nose: Nose normal.  Neck:     Thyroid: No thyroid mass or thyromegaly.     Vascular: No carotid bruit.     Trachea: Trachea normal.  Cardiovascular:     Rate and Rhythm: Normal rate and regular rhythm.     Pulses: Normal pulses.     Heart sounds: Heart sounds not distant. No murmur heard.    No friction rub. No gallop.     Comments: No peripheral edema Pulmonary:     Effort: Pulmonary effort is normal. No respiratory distress.     Breath sounds: Normal breath sounds.  Skin:    General: Skin is warm and dry.     Findings: No rash.          Comments: Area of induration and redness left lateral thigh 6 cm x 3 cm with 3 cm surrounding contusion No area of fluctuance, no pustule  Psychiatric:        Speech: Speech normal.        Behavior: Behavior normal.        Thought Content: Thought content normal.       Results for orders placed or performed in visit on 06/25/23  Lactate dehydrogenase (LDH)  Result Value Ref Range   LDH 146 98 - 192 U/L  CMP (Cancer Center only)  Result Value Ref Range   Sodium 140 135 - 145 mmol/L   Potassium 4.1 3.5 - 5.1 mmol/L   Chloride 104 98 - 111 mmol/L   CO2 31 22 - 32 mmol/L   Glucose, Bld 141 (H) 70 - 99 mg/dL   BUN 28 (H) 8 - 23 mg/dL   Creatinine 1.61 0.96 - 1.24 mg/dL   Calcium 9.0 8.9 - 04.5 mg/dL   Total Protein 6.4 (L) 6.5 - 8.1 g/dL   Albumin 3.5 3.5 - 5.0 g/dL   AST 21 15 - 41 U/L   ALT 8 0 - 44 U/L   Alkaline Phosphatase 81 38 - 126 U/L   Total Bilirubin 0.5 0.3 - 1.2 mg/dL   GFR, Estimated >40 >98 mL/min   Anion gap 5 5 - 15  CBC with Differential (Cancer Center Only)  Result Value Ref Range   WBC Count 192.2 (HH) 4.0 - 10.5 K/uL   RBC 3.40 (L) 4.22 - 5.81 MIL/uL   Hemoglobin 10.6 (L) 13.0 - 17.0 g/dL   HCT 11.9 (L) 14.7 - 82.9 %   MCV 99.1 80.0 - 100.0 fL   MCH 31.2 26.0 - 34.0 pg   MCHC 31.5 30.0 - 36.0  g/dL   RDW 47.8 (H) 29.5 - 62.1 %   Platelet Count 118 (L) 150 - 400 K/uL   nRBC 0.0  0.0 - 0.2 %   Neutrophils Relative % 2 %   Neutro Abs 3.2 1.7 - 7.7 K/uL   Lymphocytes Relative 94 %   Lymphs Abs 182.2 (H) 0.7 - 4.0 K/uL   Monocytes Relative 3 %   Monocytes Absolute 5.4 (H) 0.1 - 1.0 K/uL   Eosinophils Relative 1 %   Eosinophils Absolute 0.9 (H) 0.0 - 0.5 K/uL   Basophils Relative 0 %   Basophils Absolute 0.1 0.0 - 0.1 K/uL   WBC Morphology SMUDGE CELLS    RBC Morphology MORPHOLOGY UNREMARKABLE    Smear Review Normal platelet morphology    Immature Granulocytes 0 %   Abs Immature Granulocytes 0.46 (H) 0.00 - 0.07 K/uL    Assessment and Plan  Cellulitis and abscess of left leg Assessment & Plan: Acute, history of frequent past infections in bilateral legs likely due to immunocompromisation and venous insufficiency. No known MRSA or MRSA exposure .  No red flags or indication for admission. Patient nontoxic and feeling well.  Will treat with doxycycline 100 mg twice daily x 10 days, continue warm compresses and elevation of left leg. Return and ER precautions provided. Recommend close follow-up with PCP early next week.   Other orders -     Doxycycline Hyclate; Take 1 tablet (100 mg total) by mouth 2 (two) times daily.  Dispense: 20 tablet; Refill: 0    Return in about 4 days (around 08/16/2023) for  follow up cellulitits with PCP Dr. Harlow Asa, MD

## 2023-08-12 NOTE — Patient Instructions (Signed)
Complete course of doxycycline 100 mg twice daily x 10 days. Go to the emergency room if redness spreading, fever or not tolerating antibiotics.

## 2023-08-12 NOTE — Assessment & Plan Note (Signed)
Acute, history of frequent past infections in bilateral legs likely due to immunocompromisation and venous insufficiency. No known MRSA or MRSA exposure .  No red flags or indication for admission. Patient nontoxic and feeling well.  Will treat with doxycycline 100 mg twice daily x 10 days, continue warm compresses and elevation of left leg. Return and ER precautions provided. Recommend close follow-up with PCP early next week.

## 2023-08-16 ENCOUNTER — Ambulatory Visit: Payer: Medicare HMO | Admitting: Family Medicine

## 2023-08-16 ENCOUNTER — Encounter: Payer: Self-pay | Admitting: Family Medicine

## 2023-08-16 ENCOUNTER — Ambulatory Visit (INDEPENDENT_AMBULATORY_CARE_PROVIDER_SITE_OTHER): Payer: Medicare HMO | Admitting: Family Medicine

## 2023-08-16 VITALS — BP 122/66 | HR 64 | Temp 98.4°F | Ht 68.0 in | Wt 141.6 lb

## 2023-08-16 DIAGNOSIS — R2242 Localized swelling, mass and lump, left lower limb: Secondary | ICD-10-CM

## 2023-08-16 DIAGNOSIS — C8307 Small cell B-cell lymphoma, spleen: Secondary | ICD-10-CM

## 2023-08-16 DIAGNOSIS — L03116 Cellulitis of left lower limb: Secondary | ICD-10-CM | POA: Diagnosis not present

## 2023-08-16 DIAGNOSIS — D696 Thrombocytopenia, unspecified: Secondary | ICD-10-CM

## 2023-08-16 DIAGNOSIS — E1169 Type 2 diabetes mellitus with other specified complication: Secondary | ICD-10-CM | POA: Diagnosis not present

## 2023-08-16 NOTE — Assessment & Plan Note (Signed)
Of unclear etiology - ?fatty tumor vs hematoma vs other. Seems to be decreasing in size. Finish doxy course. Update if enlarging or worsening symptoms, consider soft tissue US vs other imaging study.

## 2023-08-16 NOTE — Patient Instructions (Signed)
Finish antibiotic course. This is treating infection well. Let us know if area enlarging or worsening.  Mass may be possible hematoma vs fatty tumor.

## 2023-08-16 NOTE — Progress Notes (Signed)
Ph: [phone] Fax: [phone]   Patient ID: Keith Jordan, male    DOB: 08/11/1941, 82 y.o.   MRN: [id number]  This visit was conducted in person.  BP 122/66   Pulse 64   Temp 98.4 F (36.9 C) (Temporal)   Ht 5\' 8"  (1.727 m)   Wt 141 lb 9.6 oz (64.2 kg)   SpO2 96%   BMI 21.53 kg/m    CC: cellulitis f/u visit  Subjective:   HPI: Keith Jordan is a 82 y.o. male presenting on 08/16/2023 for Follow-up (Cellulitis )   See prior note for details. Saw Dr Ermalene Searing last week left upper outer leg area concerning for cellulitis treated with doxycycline 100mg  bid 10d course, continued warm compresses.   Redness, pain improved however swelling persists.  Tolerating doxycycline well.   Known splenic marginal zone lymphoma followed by Dr Leonides Schanz oncology. Also with baseline anemia and thrombocytopenia.  He is on eliquis for known atrial flutter followed by cardiology.      Relevant past medical, surgical, family and social history reviewed and updated as indicated. Interim medical history since our last visit reviewed. Allergies and medications reviewed and updated. Outpatient Medications Prior to Visit  Medication Sig Dispense Refill   acetaminophen (TYLENOL) 500 MG tablet Take 500 mg by mouth every 6 (six) hours as needed for mild pain.     Ascorbic Acid (VITAMIN C) 1000 MG tablet Take 500 mg by mouth 2 (two) times daily.     atorvastatin (LIPITOR) 10 MG tablet TAKE 1 TABLET BY MOUTH EVERY DAY 90 tablet 3   carboxymethylcellulose (REFRESH PLUS) 0.5 % SOLN Place 1 drop into both eyes 2 (two) times daily as needed (dry eyes).     carvedilol (COREG) 6.25 MG tablet TAKE 1 TABLET BY MOUTH TWICE A DAY 180 tablet 3   doxycycline (VIBRA-TABS) 100 MG tablet Take 1 tablet (100 mg total) by mouth 2 (two) times daily. 20 tablet 0   ELIQUIS 5 MG TABS tablet TAKE 1 TABLET BY MOUTH TWICE A DAY 60 tablet 5   Multiple Vitamins-Minerals (MULTIVITAMIN WITH MINERALS) tablet Take 1 tablet by mouth  daily. Unknown strenght     Multiple Vitamins-Minerals (PRESERVISION AREDS) CAPS Take 1 capsule by mouth in the morning and at bedtime. 180 capsule 3   Olopatadine HCl 0.2 % SOLN Apply 1 drop to eye daily. (Both) 2.5 mL 0   SHINGRIX injection      sodium chloride (OCEAN) 0.65 % SOLN nasal spray Place 1 spray into both nostrils as needed for congestion.     triamcinolone (NASACORT) 55 MCG/ACT AERO nasal inhaler Place 1 spray into the nose daily as needed (sinus congestion). 1 each    No facility-administered medications prior to visit.     Per HPI unless specifically indicated in ROS section below Review of Systems  Objective:  BP 122/66   Pulse 64   Temp 98.4 F (36.9 C) (Temporal)   Ht 5\' 8"  (1.727 m)   Wt 141 lb 9.6 oz (64.2 kg)   SpO2 96%   BMI 21.53 kg/m   Wt Readings from Last 3 Encounters:  08/16/23 141 lb 9.6 oz (64.2 kg)  08/12/23 139 lb 4 oz (63.2 kg)  05/09/23 147 lb (66.7 kg)      Physical Exam Vitals and nursing note reviewed.  Constitutional:      Appearance: Normal appearance. He is not ill-appearing.  Musculoskeletal:     Right lower leg: No edema.  Left lower leg: No edema.     Comments: 2+ DP bilaterally  Skin:    General: Skin is warm and dry.     Findings: No rash.          Comments: 5cm diameter indurated mass to left upper lateral thigh without significant bruising, erythema or tenderness. No pore, erosion, fluctuance, pustule  Neurological:     Mental Status: He is alert.       Results for orders placed or performed in visit on 06/25/23  Lactate dehydrogenase (LDH)  Result Value Ref Range   LDH 146 98 - 192 U/L  CMP (Cancer Center only)  Result Value Ref Range   Sodium 140 135 - 145 mmol/L   Potassium 4.1 3.5 - 5.1 mmol/L   Chloride 104 98 - 111 mmol/L   CO2 31 22 - 32 mmol/L   Glucose, Bld 141 (H) 70 - 99 mg/dL   BUN 28 (H) 8 - 23 mg/dL   Creatinine 8.65 7.84 - 1.24 mg/dL   Calcium 9.0 8.9 - 69.6 mg/dL   Total Protein 6.4 (L) 6.5  - 8.1 g/dL   Albumin 3.5 3.5 - 5.0 g/dL   AST 21 15 - 41 U/L   ALT 8 0 - 44 U/L   Alkaline Phosphatase 81 38 - 126 U/L   Total Bilirubin 0.5 0.3 - 1.2 mg/dL   GFR, Estimated >29 >52 mL/min   Anion gap 5 5 - 15  CBC with Differential (Cancer Center Only)  Result Value Ref Range   WBC Count 192.2 (HH) 4.0 - 10.5 K/uL   RBC 3.40 (L) 4.22 - 5.81 MIL/uL   Hemoglobin 10.6 (L) 13.0 - 17.0 g/dL   HCT 84.1 (L) 32.4 - 40.1 %   MCV 99.1 80.0 - 100.0 fL   MCH 31.2 26.0 - 34.0 pg   MCHC 31.5 30.0 - 36.0 g/dL   RDW 02.7 (H) 25.3 - 66.4 %   Platelet Count 118 (L) 150 - 400 K/uL   nRBC 0.0 0.0 - 0.2 %   Neutrophils Relative % 2 %   Neutro Abs 3.2 1.7 - 7.7 K/uL   Lymphocytes Relative 94 %   Lymphs Abs 182.2 (H) 0.7 - 4.0 K/uL   Monocytes Relative 3 %   Monocytes Absolute 5.4 (H) 0.1 - 1.0 K/uL   Eosinophils Relative 1 %   Eosinophils Absolute 0.9 (H) 0.0 - 0.5 K/uL   Basophils Relative 0 %   Basophils Absolute 0.1 0.0 - 0.1 K/uL   WBC Morphology SMUDGE CELLS    RBC Morphology MORPHOLOGY UNREMARKABLE    Smear Review Normal platelet morphology    Immature Granulocytes 0 %   Abs Immature Granulocytes 0.46 (H) 0.00 - 0.07 K/uL   Lab Results  Component Value Date   HGBA1C 6.2 (A) 04/26/2023    Assessment & Plan:   Problem List Items Addressed This Visit     Type 2 diabetes mellitus with other specified complication (HCC)   Splenic marginal zone b-cell lymphoma (HCC)   Cellulitis of left thigh - Primary    Midway through doxycycline course with significant improvement - finish course.  Red flags to seek further care reviewed.       Thrombocytopenia (HCC)   Mass of left thigh    Of unclear etiology - ?fatty tumor vs hematoma vs other. Seems to be decreasing in size. Finish doxy course. Update if enlarging or worsening symptoms, consider soft tissue US vs other imaging study.  No orders of the defined types were placed in this encounter.   No orders of the defined types  were placed in this encounter.   Patient Instructions  Finish antibiotic course. This is treating infection well. Let us know if area enlarging or worsening.  Mass may be possible hematoma vs fatty tumor.   Follow up plan: Return if symptoms worsen or fail to improve.  Eustaquio Boyden, MD

## 2023-08-16 NOTE — Assessment & Plan Note (Deleted)
Midway through doxycycline course with significant improvement - finish course.  Red flags to seek further care reviewed.

## 2023-08-16 NOTE — Assessment & Plan Note (Signed)
Midway through doxycycline course with significant improvement - finish course.  Red flags to seek further care reviewed.

## 2023-09-02 LAB — HM DIABETES EYE EXAM

## 2023-09-26 ENCOUNTER — Other Ambulatory Visit: Payer: Self-pay | Admitting: Hematology and Oncology

## 2023-09-26 DIAGNOSIS — C8307 Small cell B-cell lymphoma, spleen: Secondary | ICD-10-CM

## 2023-09-26 NOTE — Progress Notes (Unsigned)
[facility] [phone]   [phone]  PROGRESS NOTE  Patient Care Team: Eustaquio Boyden, MD as PCP - General (Family Medicine) Thomasene Ripple, DO as PCP - Cardiology (Cardiology) Specialists, [name] as Consulting Physician (Orthopedic Surgery) [name], [facility] (Inactive) as Pharmacist (Pharmacist)  Hematological/Oncological History # Splenic Marginal Zone Lymphoma 01/30/2021: WBC 186.3, Hgb 13.5, MCV 96.3, Plt 132. Referred to Emergency Department. 02/05/2021: establish care with Dr. Leonides Schanz  02/24/2021: Week 1 of Monotherapy Rituximab 03/03/2021:  Week 2 of Monotherapy Rituximab 03/10/2021:  Week 3 of Monotherapy Rituximab. WBC 109.8 03/17/2021: Week 4 of Monotherapy Rituximab. WBC 94.8 03/24/2021: WBC 59.8 05/01/2021: WBC 89.1, Hgb 13.5, Plt 136 09/03/2021: WBC 97.8, Hgb 12.9, Plt 141 12/03/2021: WBC 137.8, Hgb 13.6, MCV 96.1, Plt 130 03/04/2022: WBC 131.1, Hgb 12.0, MCV 97.7, Plt 121 06/04/2022: WBC 144.9, Hgb 12.5, MCV 96.5, Plt 156 09/04/2022: WBC 182.3, Plt 12.3, MCV 96.9, Plt 126  Interval History:  Keith [facility] 82 y.o. male with medical history significant for splenic marginal zone lymphoma who presents for a follow up visit. The patient's last visit was on 03/26/2023. In the interim since the last visit he has been in stable health.   On exam today Keith Jordan reports he has been well overall in the interim since her last visit.  He reports his energy is about a 7 out of 10.  He reports that he does occasionally get some infectious lesions on his legs but they are none that are currently active.  He has been on antibiotics 3 times this year over the treat these infections.  He notes he also had a hematoma on his leg at 1 point.  He reports he is not having any lightheadedness, dizziness, shortness of breath.  He denies any headache or vision change.  His appetite is good and his weight is steady.  He reports that he would like to gain  weight but he is content with that being steady.  He reports that he continues to take his Eliquis therapy faithfully and though he does bruise easily he has not seen any blood in the urine, stool, or from his nose.  He reports he is not having any pain or abdominal distention.  He denies any fevers, chills, sweats, nausea, vomiting or diarrhea.  A full 10 point ROS is listed below.  MEDICAL HISTORY:  Past Medical History:  Diagnosis Date   Abnormal breath sounds 10/11/2020   Acute sinusitis 01/29/2021   Advanced care planning/counseling discussion 09/01/2016   BPH (benign prostatic hyperplasia) 09/29/2018   Ear fullness, bilateral 04/03/2021   Health maintenance examination 09/01/2016   Hyperlipidemia    Irregular heart beat 03/24/2021   Ischemic optic neuropathy 2006   [place] [facility]   Leukocytosis 01/30/2021   Severe s/p ER eval 01/2021   Medicare annual wellness visit, subsequent 10/04/2019   Nausea without vomiting 03/24/2021   Personal history of colonic adenoma 07/30/2008   Prediabetes 07/05/2016   A1c 6.5% 03/2014    Prediabetes 07/05/2016   A1c 6.5% 03/2014    Primary osteoarthritis of knee    bilateral s/p L knee replacement, receives R knee injections [name], [name])   Sebaceous cyst 01/30/2020   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    SURGICAL HISTORY: Past Surgical History:  Procedure Laterality Date   A-FLUTTER ABLATION N/A 07/11/2021   Procedure: A-FLUTTER ABLATION;  Surgeon: [name], MD;  Location: [facility];  Service: Cardiovascular;  Laterality: N/A;   COLONOSCOPY  12/2013   no polyps, no rpt due [name])   CYST EXCISION  01/2020   TOTAL KNEE ARTHROPLASTY Left 2005   [name]    SOCIAL HISTORY: Social History   Socioeconomic History   Marital status: Married    Spouse name: Not on file   Number of children: Not on file   Years of education: Not on file   Highest education level: Not on file  Occupational History   Not on file   Tobacco Use   Smoking status: Never   Smokeless tobacco: Never  Vaping Use   Vaping status: Never Used  Substance and Sexual Activity   Alcohol use: Yes    Comment: occasional   Drug use: No   Sexual activity: Not Currently  Other Topics Concern   Not on file  Social History Narrative   Lives with wife, 2 cats   Occ: retired Counsellor   Activity: works in yard   Diet: some water, fruits/vegetables daily   Social Determinants of Corporate investment banker Strain: Low Risk  (10/20/2022)   Overall Financial Resource Strain (CARDIA)    Difficulty of Paying Living Expenses: Not hard at all  Food Insecurity: No Food Insecurity (10/20/2022)   Hunger Vital Sign    Worried About Running Out of Food in the Last Year: Never true    Ran Out of Food in the Last Year: Never true  Transportation Needs: No Transportation Needs (10/20/2022)   PRAPARE - Administrator, Civil Service (Medical): No    Lack of Transportation (Non-Medical): No  Physical Activity: Sufficiently Active (10/20/2022)   Exercise Vital Sign    Days of Exercise per Week: 3 days    Minutes of Exercise per Session: 60 min  Stress: No Stress Concern Present (10/20/2022)   [employer] of Occupational Health - Occupational Stress Questionnaire    Feeling of Stress : Not at all  Social Connections: Moderately Isolated (10/20/2022)   Social Connection and Isolation Panel [NHANES]    Frequency of Communication with Friends and Family: Twice a week    Frequency of Social Gatherings with Friends and Family: Once a week    Attends Religious Services: Never    Database administrator or Organizations: No    Attends Banker Meetings: Never    Marital Status: Married  Catering manager Violence: Not At Risk (10/20/2022)   Humiliation, Afraid, Rape, and Kick questionnaire    Fear of Current or Ex-Partner: No    Emotionally Abused: No    Physically Abused: No    Sexually Abused: No    FAMILY  HISTORY: Family History  Problem Relation Age of Onset   Blindness Mother 69       ?stroke in eyes   Lung disease Father        coal miner   Arthritis Father    Colon cancer Neg Hx    Pancreatic cancer Neg Hx    Stomach cancer Neg Hx    Esophageal cancer Neg Hx    Rectal cancer Neg Hx    Cancer Neg Hx    Diabetes Neg Hx    CAD Neg Hx    Stroke Neg Hx     ALLERGIES:  has No Known Allergies.  MEDICATIONS:  Current Outpatient Medications  Medication Sig Dispense Refill   acetaminophen (TYLENOL) 500 MG tablet Take 500 mg by mouth every 6 (six) hours as needed for mild pain.  Ascorbic Acid (VITAMIN C) 1000 MG tablet Take 500 mg by mouth 2 (two) times daily.     atorvastatin (LIPITOR) 10 MG tablet TAKE 1 TABLET BY MOUTH EVERY DAY 90 tablet 3   carboxymethylcellulose (REFRESH PLUS) 0.5 % SOLN Place 1 drop into both eyes 2 (two) times daily as needed (dry eyes).     carvedilol (COREG) 6.25 MG tablet TAKE 1 TABLET BY MOUTH TWICE A DAY 180 tablet 3   doxycycline (VIBRA-TABS) 100 MG tablet Take 1 tablet (100 mg total) by mouth 2 (two) times daily. 20 tablet 0   ELIQUIS 5 MG TABS tablet TAKE 1 TABLET BY MOUTH TWICE A DAY 60 tablet 5   Multiple Vitamins-Minerals (MULTIVITAMIN WITH MINERALS) tablet Take 1 tablet by mouth daily. Unknown strenght     Multiple Vitamins-Minerals (PRESERVISION AREDS) CAPS Take 1 capsule by mouth in the morning and at bedtime. 180 capsule 3   Olopatadine HCl 0.2 % SOLN Apply 1 drop to eye daily. (Both) 2.5 mL 0   SHINGRIX injection      sodium chloride (OCEAN) 0.65 % SOLN nasal spray Place 1 spray into both nostrils as needed for congestion.     triamcinolone (NASACORT) 55 MCG/ACT AERO nasal inhaler Place 1 spray into the nose daily as needed (sinus congestion). 1 each    No current facility-administered medications for this visit.    REVIEW OF SYSTEMS:   Constitutional: ( - ) fevers, ( - )  chills , ( - ) night sweats Eyes: ( - ) blurriness of vision, ( -  ) double vision, ( - ) watery eyes Ears, nose, mouth, throat, and face: ( - ) mucositis, ( - ) sore throat Respiratory: ( - ) cough, ( - ) dyspnea, ( - ) wheezes Cardiovascular: ( - ) palpitation, ( - ) chest discomfort, ( - ) lower extremity swelling Gastrointestinal:  ( - ) nausea, ( - ) heartburn, ( - ) change in bowel habits Skin: ( - ) abnormal skin rashes Lymphatics: ( - ) new lymphadenopathy, ( - ) easy bruising Neurological: ( - ) numbness, ( -) tingling, ( - ) new weaknesses Behavioral/Psych: ( - ) mood change, ( - ) new changes  All other systems were reviewed with the patient and are negative.  PHYSICAL EXAMINATION: ECOG PERFORMANCE STATUS: 1 - Symptomatic but completely ambulatory  There were no vitals filed for this visit.    There were no vitals filed for this visit.    GENERAL: well appearing elderly Caucasian male alert, no distress and comfortable SKIN: skin color, texture, turgor are normal, no rashes or significant lesions EYES: conjunctiva are pink and non-injected, sclera clear LUNGS: clear to auscultation and percussion with normal breathing effort HEART: regular rate & rhythm and no murmurs and no lower extremity edema Musculoskeletal: no cyanosis of digits and no clubbing  PSYCH: alert & oriented x 3, fluent speech NEURO: no focal motor/sensory deficits  LABORATORY DATA:  I have reviewed the data as listed    Latest Ref Rng & Units 06/25/2023   11:02 AM 05/09/2023    3:50 PM 05/01/2023   12:05 PM  CBC  WBC 4.0 - 10.5 K/uL 192.2  226.9  204.4   Hemoglobin 13.0 - 17.0 g/dL 40.9  81.1  91.4   Hematocrit 39.0 - 52.0 % 33.7  38.3  35.6   Platelets 150 - 400 K/uL 118  144  100        Latest Ref Rng & Units 06/25/2023  11:02 AM 05/09/2023    3:50 PM 03/26/2023   11:03 AM  CMP  Glucose 70 - 99 mg/dL 161  096  045   BUN 8 - 23 mg/dL 28  26  32   Creatinine 0.61 - 1.24 mg/dL 4.09  8.11  9.14   Sodium 135 - 145 mmol/L 140  137  141   Potassium 3.5 - 5.1  mmol/L 4.1  4.4  4.3   Chloride 98 - 111 mmol/L 104  102  104   CO2 22 - 32 mmol/L 31  26  32   Calcium 8.9 - 10.3 mg/dL 9.0  8.9  9.2   Total Protein 6.5 - 8.1 g/dL 6.4  6.5  6.8   Total Bilirubin 0.3 - 1.2 mg/dL 0.5  0.8  0.6   Alkaline Phos 38 - 126 U/L 81  81  82   AST 15 - 41 U/L 21  25  26    ALT 0 - 44 U/L 8  11  12     RADIOGRAPHIC STUDIES: No results found.   ASSESSMENT & PLAN [facility] 82 y.o. male with medical history significant for splenic marginal zone lymphoma. CT CAP from 02/13/21  showed only splenic involvement and a bone marrow biopsy from 02/21/21 confirmed the diagnosis.  The patient has only leukocytosis and splenomegaly.  The recommendation is monotherapy rituximab 375 mg per metered squared q. 7 days x 4 doses. He completed this on 03/17/2021.   At this time is marginal zone lymphoma appears like an indolent lymphocyte predominant variant.  These tend to behave more like CLL.  Given that I would recommend holding on treatment unless the patient were to have difficulties with splenomegaly, anemia, or worsening thrombocytopenia.  For treatment choices one could consider treatment with Ibrutinib/Zanbrutinib vs Bendamustine + ritux/obinutuzumab. Would need caution with ibrutinib given patient's cardiac history and current anticoagulation therapy.    Patient is doing well without any significant limitations. Labs from today show lymphocytosis and modest stable thrombocytopenia. Plan for patient to return to the clinic in 12 weeks for labs and 6 months for clinic visit.  #Splenic Marginal Zone Lymphoma, stable --findings are consistent with a splenic marginal zone lymphoma. --patient completed bone marrow biopsy and a CT chest abdomen pelvis. Disease appear limited to peripheral blood and spleen.  --Treatment of choice with confirmed splenic marginal zone lymphoma is rituximab 375 mg/m q. 7 days x 4 doses. He completed this on 03/17/2021.  PLAN:  --Due to persistent  lymphocytosis, recommend to continue with allopurinol to prevent TLS. --if there is concern for worsening disease with an indication for treatment can consider treatment with Ibrutinib/Zanbrutinib vs Bendamustine + ritux/obinutuzumab. Would need caution with ibrutinib given patient's cardiac history and current anticoagulation therapy.  --labs today show WBC 198.5, hemoglobin 9.5, MCV 99.2, and platelets of 98 --RTC in 6 months with interval 3 month labs   #Thrombocytopenia-improved --Plt today stable at 98, fluctuate.  --likely 2/2 to splenomegaly.   #Nausea --Take Zofran 8 mg q 8 hours PRN.   #Atrial Flutter --patient follows with Cardiology  No orders of the defined types were placed in this encounter.  All questions were answered. The patient knows to call the clinic with any problems, questions or concerns.  A total of more than 30 minutes were spent on this encounter and over half of that time was spent on counseling and coordination of care as outlined above.   Ulysees Barns, MD Department of Hematology/Oncology [facility] Phone: [phone] Pager: [phone] Email: [email or web address]

## 2023-09-27 ENCOUNTER — Inpatient Hospital Stay: Payer: Medicare HMO

## 2023-09-27 ENCOUNTER — Inpatient Hospital Stay: Payer: Medicare HMO | Attending: Hematology and Oncology | Admitting: Hematology and Oncology

## 2023-09-27 VITALS — BP 133/61 | HR 57 | Temp 98.1°F | Resp 16 | Wt 142.8 lb

## 2023-09-27 DIAGNOSIS — D72820 Lymphocytosis (symptomatic): Secondary | ICD-10-CM

## 2023-09-27 DIAGNOSIS — Z83518 Family history of other specified eye disorder: Secondary | ICD-10-CM | POA: Diagnosis not present

## 2023-09-27 DIAGNOSIS — D649 Anemia, unspecified: Secondary | ICD-10-CM | POA: Diagnosis not present

## 2023-09-27 DIAGNOSIS — R161 Splenomegaly, not elsewhere classified: Secondary | ICD-10-CM | POA: Diagnosis not present

## 2023-09-27 DIAGNOSIS — R110 Nausea: Secondary | ICD-10-CM | POA: Diagnosis not present

## 2023-09-27 DIAGNOSIS — Z825 Family history of asthma and other chronic lower respiratory diseases: Secondary | ICD-10-CM | POA: Diagnosis not present

## 2023-09-27 DIAGNOSIS — Z8572 Personal history of non-Hodgkin lymphomas: Secondary | ICD-10-CM | POA: Insufficient documentation

## 2023-09-27 DIAGNOSIS — C8307 Small cell B-cell lymphoma, spleen: Secondary | ICD-10-CM | POA: Diagnosis not present

## 2023-09-27 DIAGNOSIS — I4892 Unspecified atrial flutter: Secondary | ICD-10-CM | POA: Insufficient documentation

## 2023-09-27 DIAGNOSIS — Z7901 Long term (current) use of anticoagulants: Secondary | ICD-10-CM | POA: Insufficient documentation

## 2023-09-27 DIAGNOSIS — Z8261 Family history of arthritis: Secondary | ICD-10-CM | POA: Diagnosis not present

## 2023-09-27 DIAGNOSIS — Z79899 Other long term (current) drug therapy: Secondary | ICD-10-CM | POA: Insufficient documentation

## 2023-09-27 LAB — CBC WITH DIFFERENTIAL (CANCER CENTER ONLY)
Abs Immature Granulocytes: 0.32 10*3/uL — ABNORMAL HIGH (ref 0.00–0.07)
Basophils Absolute: 0.1 10*3/uL (ref 0.0–0.1)
Basophils Relative: 0 %
Eosinophils Absolute: 0.6 10*3/uL — ABNORMAL HIGH (ref 0.0–0.5)
Eosinophils Relative: 0 %
HCT: 36.7 % — ABNORMAL LOW (ref 39.0–52.0)
Hemoglobin: 11.5 g/dL — ABNORMAL LOW (ref 13.0–17.0)
Immature Granulocytes: 0 %
Lymphocytes Relative: 96 %
Lymphs Abs: 188.6 10*3/uL — ABNORMAL HIGH (ref 0.7–4.0)
MCH: 31.1 pg (ref 26.0–34.0)
MCHC: 31.3 g/dL (ref 30.0–36.0)
MCV: 99.2 fL (ref 80.0–100.0)
Monocytes Absolute: 6.3 10*3/uL — ABNORMAL HIGH (ref 0.1–1.0)
Monocytes Relative: 3 %
Neutro Abs: 2.7 10*3/uL (ref 1.7–7.7)
Neutrophils Relative %: 1 %
Platelet Count: 98 10*3/uL — ABNORMAL LOW (ref 150–400)
RBC: 3.7 MIL/uL — ABNORMAL LOW (ref 4.22–5.81)
RDW: 15.6 % — ABNORMAL HIGH (ref 11.5–15.5)
Smear Review: DECREASED
WBC Count: 198.5 10*3/uL (ref 4.0–10.5)
WBC Morphology: ABNORMAL
nRBC: 0 % (ref 0.0–0.2)

## 2023-09-27 LAB — CMP (CANCER CENTER ONLY)
ALT: 8 U/L (ref 0–44)
AST: 23 U/L (ref 15–41)
Albumin: 3.9 g/dL (ref 3.5–5.0)
Alkaline Phosphatase: 75 U/L (ref 38–126)
Anion gap: 6 (ref 5–15)
BUN: 25 mg/dL — ABNORMAL HIGH (ref 8–23)
CO2: 33 mmol/L — ABNORMAL HIGH (ref 22–32)
Calcium: 9.3 mg/dL (ref 8.9–10.3)
Chloride: 102 mmol/L (ref 98–111)
Creatinine: 1.14 mg/dL (ref 0.61–1.24)
GFR, Estimated: >60 mL/min (ref >60)
Glucose, Bld: 132 mg/dL — ABNORMAL HIGH (ref 70–99)
Potassium: 4.2 mmol/L (ref 3.5–5.1)
Sodium: 141 mmol/L (ref 135–145)
Total Bilirubin: 0.6 mg/dL (ref 0.3–1.2)
Total Protein: 6.4 g/dL — ABNORMAL LOW (ref 6.5–8.1)

## 2023-09-27 LAB — LACTATE DEHYDROGENASE: LDH: 153 U/L (ref 98–192)

## 2023-10-01 ENCOUNTER — Other Ambulatory Visit: Payer: Self-pay | Admitting: Family Medicine

## 2023-10-01 NOTE — Telephone Encounter (Signed)
Patient has cpe scheduled 11/01/23. Ok to refill until then?

## 2023-10-14 ENCOUNTER — Other Ambulatory Visit: Payer: Self-pay | Admitting: Family Medicine

## 2023-10-14 DIAGNOSIS — D696 Thrombocytopenia, unspecified: Secondary | ICD-10-CM

## 2023-10-14 DIAGNOSIS — E1169 Type 2 diabetes mellitus with other specified complication: Secondary | ICD-10-CM

## 2023-10-14 DIAGNOSIS — C8307 Small cell B-cell lymphoma, spleen: Secondary | ICD-10-CM

## 2023-10-14 DIAGNOSIS — N401 Enlarged prostate with lower urinary tract symptoms: Secondary | ICD-10-CM

## 2023-10-19 ENCOUNTER — Other Ambulatory Visit (INDEPENDENT_AMBULATORY_CARE_PROVIDER_SITE_OTHER): Payer: Medicare HMO

## 2023-10-19 DIAGNOSIS — E1169 Type 2 diabetes mellitus with other specified complication: Secondary | ICD-10-CM

## 2023-10-19 DIAGNOSIS — E785 Hyperlipidemia, unspecified: Secondary | ICD-10-CM | POA: Diagnosis not present

## 2023-10-19 LAB — LIPID PANEL
Cholesterol: 104 mg/dL (ref 0–200)
HDL: 28.3 mg/dL — ABNORMAL LOW (ref >39.00)
LDL Cholesterol: 57 mg/dL (ref 0–99)
NonHDL: 76.12
Total CHOL/HDL Ratio: 4
Triglycerides: 97 mg/dL (ref 0.0–149.0)
VLDL: 19.4 mg/dL (ref 0.0–40.0)

## 2023-10-19 LAB — MICROALBUMIN / CREATININE URINE RATIO
Creatinine,U: 145.5 mg/dL
Microalb Creat Ratio: 0.9 mg/g (ref 0.0–30.0)
Microalb, Ur: 1.4 mg/dL (ref 0.0–1.9)

## 2023-10-19 LAB — HEMOGLOBIN A1C: Hgb A1c MFr Bld: 6.4 % (ref 4.6–6.5)

## 2023-10-26 ENCOUNTER — Encounter: Payer: Medicare HMO | Admitting: Family Medicine

## 2023-11-01 ENCOUNTER — Ambulatory Visit: Payer: Medicare HMO | Admitting: Family Medicine

## 2023-11-01 ENCOUNTER — Encounter: Payer: Self-pay | Admitting: Family Medicine

## 2023-11-01 VITALS — BP 134/70 | HR 60 | Temp 97.7°F | Ht 68.0 in | Wt 139.0 lb

## 2023-11-01 DIAGNOSIS — E785 Hyperlipidemia, unspecified: Secondary | ICD-10-CM

## 2023-11-01 DIAGNOSIS — C8307 Small cell B-cell lymphoma, spleen: Secondary | ICD-10-CM | POA: Diagnosis not present

## 2023-11-01 DIAGNOSIS — Z23 Encounter for immunization: Secondary | ICD-10-CM | POA: Diagnosis not present

## 2023-11-01 DIAGNOSIS — Z7189 Other specified counseling: Secondary | ICD-10-CM | POA: Diagnosis not present

## 2023-11-01 DIAGNOSIS — Z0000 Encounter for general adult medical examination without abnormal findings: Secondary | ICD-10-CM

## 2023-11-01 DIAGNOSIS — G4700 Insomnia, unspecified: Secondary | ICD-10-CM | POA: Diagnosis not present

## 2023-11-01 DIAGNOSIS — H93A9 Pulsatile tinnitus, unspecified ear: Secondary | ICD-10-CM | POA: Diagnosis not present

## 2023-11-01 DIAGNOSIS — I483 Typical atrial flutter: Secondary | ICD-10-CM | POA: Diagnosis not present

## 2023-11-01 DIAGNOSIS — E1169 Type 2 diabetes mellitus with other specified complication: Secondary | ICD-10-CM | POA: Diagnosis not present

## 2023-11-01 MED ORDER — THEANINE 50 MG PO TBDP: 1.0000 | ORAL_TABLET | Freq: Every evening | ORAL | Status: DC | PRN

## 2023-11-01 MED ORDER — ATORVASTATIN CALCIUM 10 MG PO TABS: 10.0000 mg | ORAL_TABLET | Freq: Every day | ORAL | 4 refills | Status: DC

## 2023-11-01 MED ORDER — L-THEANINE 200 MG PO CAPS: 1.0000 | ORAL_CAPSULE | Freq: Every evening | ORAL | Status: DC | PRN

## 2023-11-01 NOTE — Patient Instructions (Addendum)
Flu shot today  Call Dr Mallory Shirk office to ask about follow up appointment.  Stop melatonin and try L-theanine as needed at night, may use upon night time awakening.  I'd like to order carotid artery ultrasound to evaluate for blockage causing pulsing in head at night.  Good to see you today Return as needed or in 1 year for next physical

## 2023-11-01 NOTE — Assessment & Plan Note (Signed)
Chronic, A1c stable off medication. Encouraged continue following low sugar low carb diabetic diet.

## 2023-11-01 NOTE — Assessment & Plan Note (Signed)
Previously discussed.

## 2023-11-01 NOTE — Assessment & Plan Note (Signed)
Chronic, stable on atorvastatin - continue. Discussed increasing exercise to improve HDL.  The ASCVD Risk score (Arnett DK, et al., 2019) failed to calculate for the following reasons:   The 2019 ASCVD risk score is only valid for ages 13 to 71

## 2023-11-01 NOTE — Assessment & Plan Note (Signed)
Chronic sleep maintenance insomnia.  Melatonin didn't really help this As he doesn't have significant sleep initiation difficulty, will stop melatonin and instead try L-theanine supplement to use PRN sleep maintenance issues. Encouraged continued good sleep hygiene measures.

## 2023-11-01 NOTE — Progress Notes (Signed)
Ph: [phone] Fax: [phone]   Patient ID: Keith Jordan, male    DOB: 1941/05/22, 82 y.o.   MRN: [id number]  This visit was conducted in person.  BP 134/70   Pulse 60   Temp 97.7 F (36.5 C) (Oral)   Ht 5\' 8"  (1.727 m)   Wt 139 lb (63 kg)   SpO2 96%   BMI 21.13 kg/m    CC: AMW/CPE Subjective:   HPI: Keith Jordan is a 82 y.o. male presenting on 11/01/2023 for Medicare Wellness   Did not see health advisor this year   Hearing Screening - Comments:: Wears B hearing aids. Not wearing R hearing aid at today's OV due to dead battery.  Vision Screening - Comments:: Last eye exam, 08/2023.  Flowsheet Row Office Visit from 11/01/2023 in [facility] [place]  PHQ-2 Total Score 0          11/01/2023   11:16 AM 05/04/2023    2:03 PM 04/26/2023   11:08 AM 10/20/2022    2:21 PM 10/13/2021   12:01 PM  Fall Risk   Falls in the past year? 0 0 0 0 0  Number falls in past yr:    0   Injury with Fall?    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 WBC baseline 130-140k. Continues allopurinol   Typical aflutter found on Zio monitor use s/p successful ablation 06/2021 followed by cardiologist Dr Thomasene Ripple and EP Dr Elberta Fortis. Now on Eliquis and carvedilol twice daily. Heart monitor reassuring 12/2022.   Diet controlled diabetes - continues monitoring diet.   L leg hematoma fully resolved, as well as cellulitis. Has had 3 courses of cellulitis to BLE.   Sleep maintenance insomnia - averages 6-7 hours of sleep/night. Wakes up 3-4 times a night. Hears pulse in ears at night. No significant nocturia. Some better off diltiazem. Melatonin 3-6mg  helps him fall asleep, but still having trouble with sleep maintenance insomnia.   Preventative: COLONOSCOPY Date: 12/2013 no  polyps, no rpt due [name]) Prostate cancer screening - h/o BPH. Nocturia x1-2. Denies trouble with stream. Discussed, will stop screening.  Lung cancer screening - never smoker  Flu shot yearly COVID vaccine - Pfizer 01/2020 x2, booster 11/2021 Tetanus shot unsure  Prevnar-13 2017, pneumovax 2018 RSV - discussed  Shingrix - 10/2022, 02/2023 Advanced directive discussion - scanned 2022. Son Keith Jordan is HCPOA then Keith Jordan and [name] discretion to [employer] regarding life prolonging measures. Would want to be full code but wouldn't want prolonged life support if terminal condition. Doesn't know about feeding tube, leaning against this.  Sleep - averaging 6-7 hours/night  Seat belt use discussed  Sunscreen use discussed, no changing moles on skin  Non smoker  Alcohol use - none  Dentist Q87mo  Eye exam yearly  Bowel - no constipation  Bladder - no incontinence   Lives with wife, 2 cats  Occ: retired Counsellor  Activity: works in yard, uses [employer] at home  Diet: some water, likes flavored sparkling water, fruits/vegetables daily      Relevant past medical, surgical, family and social history reviewed and updated as indicated. Interim medical history since our last visit reviewed. Allergies and medications reviewed and updated.  Outpatient Medications Prior to Visit  Medication Sig Dispense Refill   acetaminophen (TYLENOL) 500 MG tablet Take 500 mg by mouth every 6 (six) hours as needed for mild pain.     Ascorbic Acid (VITAMIN C) 1000 MG tablet Take 500 mg by mouth 2 (two) times daily.     carboxymethylcellulose (REFRESH PLUS) 0.5 % SOLN Place 1 drop into both eyes 2 (two) times daily as needed (dry eyes).     carvedilol (COREG) 6.25 MG tablet TAKE 1 TABLET BY MOUTH TWICE A DAY 180 tablet 3   doxycycline (VIBRA-TABS) 100 MG tablet Take 1 tablet (100 mg total) by mouth 2 (two) times daily. 20 tablet 0   ELIQUIS 5 MG TABS tablet TAKE 1 TABLET BY MOUTH TWICE A DAY 60  tablet 5   Multiple Vitamins-Minerals (MULTIVITAMIN WITH MINERALS) tablet Take 1 tablet by mouth daily. Unknown strenght     Multiple Vitamins-Minerals (PRESERVISION AREDS) CAPS Take 1 capsule by mouth in the morning and at bedtime. 180 capsule 3   Olopatadine HCl 0.2 % SOLN Apply 1 drop to eye daily. (Both) 2.5 mL 0   sodium chloride (OCEAN) 0.65 % SOLN nasal spray Place 1 spray into both nostrils as needed for congestion.     triamcinolone (NASACORT) 55 MCG/ACT AERO nasal inhaler Place 1 spray into the nose daily as needed (sinus congestion). 1 each    atorvastatin (LIPITOR) 10 MG tablet TAKE 1 TABLET BY MOUTH EVERY DAY 90 tablet 3   SHINGRIX injection      No facility-administered medications prior to visit.     Per HPI unless specifically indicated in ROS section below Review of Systems  Constitutional:  Negative for activity change, appetite change, chills, fatigue, fever and unexpected weight change.  HENT:  Negative for hearing loss.   Eyes:  Negative for visual disturbance.  Respiratory:  Negative for cough, chest tightness, shortness of breath and wheezing.   Cardiovascular:  Negative for chest pain, palpitations and leg swelling.  Gastrointestinal:  Negative for abdominal distention, abdominal pain, blood in stool, constipation, diarrhea, nausea and vomiting.  Genitourinary:  Negative for difficulty urinating and hematuria.  Musculoskeletal:  Negative for arthralgias, myalgias and neck pain.  Skin:  Negative for rash.  Neurological:  Negative for dizziness, seizures, syncope and headaches.  Hematological:  Negative for adenopathy. Bruises/bleeds easily.  Psychiatric/Behavioral:  Negative for dysphoric mood. The patient is not nervous/anxious.     Objective:  BP 134/70   Pulse 60   Temp 97.7 F (36.5 C) (Oral)   Ht 5\' 8"  (1.727 m)   Wt 139 lb (63 kg)   SpO2 96%   BMI 21.13 kg/m   Wt Readings from Last 3 Encounters:  11/01/23 139 lb (63 kg)  09/27/23 142 lb 12.8 oz  (64.8 kg)  08/16/23 141 lb 9.6 oz (64.2 kg)      Physical Exam Vitals and nursing note reviewed.  Constitutional:      General: He is not in acute distress.    Appearance: Normal appearance. He is well-developed. He is not ill-appearing.  HENT:     Head: Normocephalic and atraumatic.     Right Ear: Hearing, tympanic membrane, ear canal and external ear normal. There is no impacted cerumen.     Left Ear: Hearing, tympanic membrane, ear canal and external ear normal. There is no impacted cerumen.     Mouth/Throat:     Mouth: Mucous membranes are moist.     Pharynx: Oropharynx is clear. No oropharyngeal  exudate or posterior oropharyngeal erythema.  Eyes:     General: No scleral icterus.    Extraocular Movements: Extraocular movements intact.     Conjunctiva/sclera: Conjunctivae normal.     Pupils: Pupils are equal, round, and reactive to light.  Neck:     Thyroid: No thyroid mass or thyromegaly.     Vascular: No carotid bruit.  Cardiovascular:     Rate and Rhythm: Normal rate and regular rhythm.     Pulses: Normal pulses.          Radial pulses are 2+ on the right side and 2+ on the left side.     Heart sounds: Normal heart sounds. No murmur heard. Pulmonary:     Effort: Pulmonary effort is normal. No respiratory distress.     Breath sounds: Normal breath sounds. No wheezing, rhonchi or rales.  Abdominal:     General: Bowel sounds are normal. There is no distension.     Palpations: Abdomen is soft. There is no mass.     Tenderness: There is no abdominal tenderness. There is no guarding or rebound.     Hernia: No hernia is present.  Musculoskeletal:        General: Normal range of motion.     Cervical back: Normal range of motion and neck supple.     Right lower leg: No edema.     Left lower leg: No edema.  Lymphadenopathy:     Cervical: No cervical adenopathy.  Skin:    General: Skin is warm and dry.     Findings: No rash.  Neurological:     General: No focal deficit  present.     Mental Status: He is alert and oriented to person, place, and time.     Comments:  Recall 3/3 Calculation 3/5 serial 3s   Psychiatric:        Mood and Affect: Mood normal.        Behavior: Behavior normal.        Thought Content: Thought content normal.        Judgment: Judgment normal.       Results for orders placed or performed in visit on 10/19/23  Microalbumin / creatinine urine ratio  Result Value Ref Range   Microalb, Ur 1.4 0.0 - 1.9 mg/dL   Creatinine,U 962.9 mg/dL   Microalb Creat Ratio 0.9 0.0 - 30.0 mg/g  Hemoglobin A1c  Result Value Ref Range   Hgb A1c MFr Bld 6.4 4.6 - 6.5 %  Lipid panel  Result Value Ref Range   Cholesterol 104 0 - 200 mg/dL   Triglycerides 52.8 0.0 - 149.0 mg/dL   HDL 41.32 (L) >44.01 mg/dL   VLDL 02.7 0.0 - 25.3 mg/dL   LDL Cholesterol 57 0 - 99 mg/dL   Total CHOL/HDL Ratio 4    NonHDL 76.12     Assessment & Plan:   Problem List Items Addressed This Visit     Health maintenance examination (Chronic)    Preventative protocols reviewed and updated unless pt declined. Discussed healthy diet and lifestyle.       Advanced care planning/counseling discussion (Chronic)    Previously discussed      Medicare annual wellness visit, subsequent - Primary (Chronic)    I have personally reviewed the Medicare Annual Wellness questionnaire and have noted 1. The patient's medical and social history 2. Their use of alcohol, tobacco or illicit drugs 3. Their current medications and supplements 4. The patient's functional ability including ADL's, fall  risks, home safety risks and hearing or visual impairment. Cognitive function has been assessed and addressed as indicated.  5. Diet and physical activity 6. Evidence for depression or mood disorders The patients weight, height, BMI have been recorded in the chart. I have made referrals, counseling and provided education to the patient based on review of the above and I have provided the  pt with a written personalized care plan for preventive services. Provider list updated.. See scanned questionairre as needed for further documentation. Reviewed preventative protocols and updated unless pt declined.       Hyperlipidemia associated with type 2 diabetes mellitus (HCC)    Chronic, stable on atorvastatin - continue. Discussed increasing exercise to improve HDL.  The ASCVD Risk score (Arnett DK, et al., 2019) failed to calculate for the following reasons:   The 2019 ASCVD risk score is only valid for ages 69 to 80       Relevant Medications   atorvastatin (LIPITOR) 10 MG tablet   Type 2 diabetes mellitus with other specified complication (HCC)    Chronic, A1c stable off medication. Encouraged continue following low sugar low carb diabetic diet.       Relevant Medications   atorvastatin (LIPITOR) 10 MG tablet   Splenic marginal zone b-cell lymphoma [facility])    Appreciate oncology care.       Typical atrial flutter [facility])    Appreciate cardiology care. Continues eliquis, carvedilol.       Relevant Medications   atorvastatin (LIPITOR) 10 MG tablet   Insomnia    Chronic sleep maintenance insomnia.  Melatonin didn't really help this As he doesn't have significant sleep initiation difficulty, will stop melatonin and instead try L-theanine supplement to use PRN sleep maintenance issues. Encouraged continued good sleep hygiene measures.       Pulsatile tinnitus    L>R Affecting sleep, only occurs at night time. Will check baseline carotid US. Consider further head vessel imaging if ongoing/worsening.       Relevant Orders   VAS US CAROTID   Other Visit Diagnoses     Encounter for immunization       Relevant Orders   Flu Vaccine Trivalent High Dose (Fluad) (Completed)        Meds ordered this encounter  Medications   atorvastatin (LIPITOR) 10 MG tablet    Sig: Take 1 tablet (10 mg total) by mouth daily.    Dispense:  90 tablet    Refill:  4   DISCONTD:  Theanine 50 MG TBDP    Sig: Take 1 tablet by mouth at bedtime as needed (sleep maintenance insomnia).   L-Theanine 200 MG CAPS    Sig: Take 1 Capful by mouth at bedtime as needed (sleep maintenance insomnia).    Orders Placed This Encounter  Procedures   Flu Vaccine Trivalent High Dose (Fluad)    Patient Instructions  Flu shot today  Call Dr Mallory Shirk office to ask about follow up appointment.  Stop melatonin and try L-theanine as needed at night, may use upon night time awakening.  I'd like to order carotid artery ultrasound to evaluate for blockage causing pulsing in head at night.  Good to see you today Return as needed or in 1 year for next physical   Follow up plan: Return in about 1 year (around 10/31/2024) for annual exam, prior fasting for blood work, medicare wellness visit.  Eustaquio Boyden, MD

## 2023-11-01 NOTE — Assessment & Plan Note (Addendum)
L>R Affecting sleep, only occurs at night time. Will check baseline carotid US. Consider further head vessel imaging if ongoing/worsening.

## 2023-11-01 NOTE — Assessment & Plan Note (Signed)
Appreciate cardiology care. Continues eliquis, carvedilol.

## 2023-11-01 NOTE — Assessment & Plan Note (Signed)
Appreciate oncology care.  °

## 2023-11-01 NOTE — Assessment & Plan Note (Signed)

## 2023-11-01 NOTE — Assessment & Plan Note (Signed)
Preventative protocols reviewed and updated unless pt declined. Discussed healthy diet and lifestyle.  

## 2023-11-04 ENCOUNTER — Ambulatory Visit (HOSPITAL_COMMUNITY)
Admission: RE | Admit: 2023-11-04 | Discharge: 2023-11-04 | Disposition: A | Discharge status: Home / Self Care | Payer: Medicare HMO | Source: Ambulatory Visit | Attending: Family Medicine | Admitting: Family Medicine

## 2023-11-04 DIAGNOSIS — H93A9 Pulsatile tinnitus, unspecified ear: Secondary | ICD-10-CM | POA: Insufficient documentation

## 2023-11-08 ENCOUNTER — Encounter: Payer: Self-pay | Admitting: Family Medicine

## 2023-11-08 DIAGNOSIS — I6529 Occlusion and stenosis of unspecified carotid artery: Secondary | ICD-10-CM | POA: Insufficient documentation

## 2023-11-09 ENCOUNTER — Encounter: Payer: Self-pay | Admitting: Family Medicine

## 2023-11-09 DIAGNOSIS — H353130 Nonexudative age-related macular degeneration, bilateral, stage unspecified: Secondary | ICD-10-CM | POA: Insufficient documentation

## 2023-11-09 DIAGNOSIS — H18513 Endothelial corneal dystrophy, bilateral: Secondary | ICD-10-CM | POA: Insufficient documentation

## 2023-11-17 ENCOUNTER — Ambulatory Visit: Payer: Medicare HMO

## 2023-11-17 VITALS — Ht 68.0 in | Wt 139.0 lb

## 2023-11-17 DIAGNOSIS — Z0000 Encounter for general adult medical examination without abnormal findings: Secondary | ICD-10-CM

## 2023-11-17 NOTE — Progress Notes (Signed)
Subjective:   Keith Jordan is a 82 y.o. male who presents for Medicare Annual/Subsequent preventive examination.  Visit Complete: Virtual I connected with  Keith Jordan on 11/17/23 by a audio enabled telemedicine application and verified that I am speaking with the correct person using two identifiers.  Patient Location: Home  Provider Location: Office/Clinic  I discussed the limitations of evaluation and management by telemedicine. The patient expressed understanding and agreed to proceed.  Vital Signs: Because this visit was a virtual/telehealth visit, some criteria may be missing or patient reported. Any vitals not documented were not able to be obtained and vitals that have been documented are patient reported.  Patient Medicare AWV questionnaire was completed by the patient on (not done); I have confirmed that all information answered by patient is correct and no changes since this date. Cardiac Risk Factors include: advanced age (>71men, >22 women);diabetes mellitus;dyslipidemia;male gender;hypertension;sedentary lifestyle    Objective:    Today's Vitals   11/17/23 1335  Weight: 139 lb (63 kg)  Height: 5\' 8"  (1.727 m)   Body mass index is 21.13 kg/m.     11/17/2023    1:47 PM 05/09/2023    3:50 PM 10/20/2022    2:20 PM 07/11/2021   12:10 PM 03/24/2021   10:24 AM 03/17/2021   10:04 AM 03/10/2021   10:51 AM  Advanced Directives  Does Patient Have a Medical Advance Directive? Yes No Yes Yes Yes Yes Yes  Type of Estate agent of [place];Living will  Healthcare Power of [place];Living will  Healthcare Power of [employer] of [place];Living will Healthcare Power of [place];Living will  Does patient want to make changes to medical advance directive?   No - Patient declined  No - Patient declined  No - Patient declined  Copy of Healthcare Power of Attorney in Chart? Yes - validated most recent copy scanned in chart (See row information)   Yes - validated most recent copy scanned in chart (See row information)  Yes - validated most recent copy scanned in chart (See row information) Yes - validated most recent copy scanned in chart (See row information) Yes - validated most recent copy scanned in chart (See row information)    Current Medications (verified) Outpatient Encounter Medications as of 11/17/2023  Medication Sig   acetaminophen (TYLENOL) 500 MG tablet Take 500 mg by mouth every 6 (six) hours as needed for mild pain.   Ascorbic Acid (VITAMIN C) 1000 MG tablet Take 500 mg by mouth 2 (two) times daily.   atorvastatin (LIPITOR) 10 MG tablet Take 1 tablet (10 mg total) by mouth daily.   carboxymethylcellulose (REFRESH PLUS) 0.5 % SOLN Place 1 drop into both eyes 2 (two) times daily as needed (dry eyes).   carvedilol (COREG) 6.25 MG tablet TAKE 1 TABLET BY MOUTH TWICE A DAY   doxycycline (VIBRA-TABS) 100 MG tablet Take 1 tablet (100 mg total) by mouth 2 (two) times daily.   ELIQUIS 5 MG TABS tablet TAKE 1 TABLET BY MOUTH TWICE A DAY   L-Theanine 200 MG CAPS Take 1 Capful by mouth at bedtime as needed (sleep maintenance insomnia).   Multiple Vitamins-Minerals (MULTIVITAMIN WITH MINERALS) tablet Take 1 tablet by mouth daily. Unknown strenght   Multiple Vitamins-Minerals (PRESERVISION AREDS) CAPS Take 1 capsule by mouth in the morning and at bedtime.   Olopatadine HCl 0.2 % SOLN Apply 1 drop to eye daily. (Both)   sodium chloride (OCEAN) 0.65 % SOLN nasal spray Place 1 spray  into both nostrils as needed for congestion.   triamcinolone (NASACORT) 55 MCG/ACT AERO nasal inhaler Place 1 spray into the nose daily as needed (sinus congestion).   No facility-administered encounter medications on file as of 11/17/2023.    Allergies (verified) Patient has no known allergies.   History: Past Medical History:  Diagnosis Date   Abnormal breath sounds 10/11/2020   Acute sinusitis 01/29/2021   Advanced care planning/counseling  discussion 09/01/2016   BPH (benign prostatic hyperplasia) 09/29/2018   Ear fullness, bilateral 04/03/2021   Health maintenance examination 09/01/2016   Hyperlipidemia    Irregular heart beat 03/24/2021   Ischemic optic neuropathy 2006   [place] [facility]   Leukocytosis 01/30/2021   Severe s/p ER eval 01/2021   Medicare annual wellness visit, subsequent 10/04/2019   Nausea without vomiting 03/24/2021   Personal history of colonic adenoma 07/30/2008   Prediabetes 07/05/2016   A1c 6.5% 03/2014    Prediabetes 07/05/2016   A1c 6.5% 03/2014    Primary osteoarthritis of knee    bilateral s/p L knee replacement, receives R knee injections [name], [name])   Sebaceous cyst 01/30/2020   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   Past Surgical History:  Procedure Laterality Date   A-FLUTTER ABLATION N/A 07/11/2021   Procedure: A-FLUTTER ABLATION;  Surgeon: [name], MD;  Location: [facility] INVASIVE CV [facility];  Service: Cardiovascular;  Laterality: N/A;   COLONOSCOPY  12/2013   no polyps, no rpt due [name])   CYST EXCISION  01/2020   TOTAL KNEE ARTHROPLASTY Left 2005   [name]   Family History  Problem Relation Age of Onset   Blindness Mother 47       ?stroke in eyes   Lung disease Father        coal miner   Arthritis Father    Colon cancer Neg Hx    Pancreatic cancer Neg Hx    Stomach cancer Neg Hx    Esophageal cancer Neg Hx    Rectal cancer Neg Hx    Cancer Neg Hx    Diabetes Neg Hx    CAD Neg Hx    Stroke Neg Hx    Social History   Socioeconomic History   Marital status: Married    Spouse name: Not on file   Number of children: Not on file   Years of education: Not on file   Highest education level: Not on file  Occupational History   Not on file  Tobacco Use   Smoking status: Never   Smokeless tobacco: Never  Vaping Use   Vaping status: Never Used  Substance and Sexual Activity   Alcohol use: Yes    Comment: occasional   Drug use: No   Sexual activity: Not  Currently  Other Topics Concern   Not on file  Social History Narrative   Lives with wife, 2 cats   Occ: retired Counsellor   Activity: works in yard   Diet: some water, fruits/vegetables daily   Social Determinants of Corporate investment banker Strain: Low Risk  (11/17/2023)   Overall Financial Resource Strain (CARDIA)    Difficulty of Paying Living Expenses: Not hard at all  Food Insecurity: No Food Insecurity (11/17/2023)   Hunger Vital Sign    Worried About Running Out of Food in the Last Year: Never true    Ran Out of Food in the Last Year: Never true  Transportation Needs: No Transportation Needs (11/17/2023)   PRAPARE - Transportation  Lack of Transportation (Medical): No    Lack of Transportation (Non-Medical): No  Physical Activity: Inactive (11/17/2023)   Exercise Vital Sign    Days of Exercise per Week: 0 days    Minutes of Exercise per Session: 0 min  Stress: No Stress Concern Present (11/17/2023)   [employer] of Occupational Health - Occupational Stress Questionnaire    Feeling of Stress : Only a little  Social Connections: Moderately Isolated (11/17/2023)   Social Connection and Isolation Panel [NHANES]    Frequency of Communication with Friends and Family: Twice a week    Frequency of Social Gatherings with Friends and Family: Once a week    Attends Religious Services: Never    Database administrator or Organizations: No    Attends Engineer, structural: Never    Marital Status: Married    Tobacco Counseling Counseling given: Not Answered   Clinical Intake:  Pre-visit preparation completed: Yes  Pain : No/denies pain    BMI - recorded: 21.13 Nutritional Status: BMI of 19-24  Normal Nutritional Risks: None Diabetes: Yes CBG done?: Yes (pt says 118 was BS yesterday am) CBG resulted in Enter/ Edit results?: No Did pt. bring in CBG monitor from home?: No  How often do you need to have someone help you when you read instructions,  pamphlets, or other written materials from your doctor or pharmacy?: 1 - Never  Interpreter Needed?: No  Comments: lives with wife Information entered by :: B.Michaelene Dutan,LPN   Activities of Daily Living    11/17/2023    1:47 PM  In your present state of health, do you have any difficulty performing the following activities:  Hearing? 0  Vision? 0  Difficulty concentrating or making decisions? 0  Walking or climbing stairs? 0  Dressing or bathing? 0  Doing errands, shopping? 0  Preparing Food and eating ? N  Using the Toilet? N  In the past six months, have you accidently leaked urine? N  Do you have problems with loss of bowel control? N  Managing your Medications? N  Managing your Finances? N  Housekeeping or managing your Housekeeping? N    Patient Care Team: Eustaquio Boyden, MD as PCP - General (Family Medicine) Thomasene Ripple, DO as PCP - Cardiology (Cardiology) Specialists, [name] as Consulting Physician (Orthopedic Surgery) [name], [facility] (Inactive) as Pharmacist (Pharmacist) Pa, [facility] (Optometry)  Indicate any recent Medical Services you may have received from other than [facility] providers in the past year (date may be approximate).     Assessment:   This is a routine wellness examination for Travis.  Hearing/Vision screen Hearing Screening - Comments:: Pt says he can hear good with hearing aids  Vision Screening - Comments:: Pt says sight is good with glasses Sept last eye exam [facility]   Goals Addressed             This Visit's Progress    COMPLETED: DIET - EAT MORE FRUITS AND VEGETABLES   On track    Increase physical activity   Not on track    Starting 09/26/2018, I will continue to exercise for at least 30 min 3 days per week.      COMPLETED: Patient Stated   On track    10/04/2020, I will maintain and continue medications as prescribed.        Depression Screen    11/17/2023    1:43 PM 11/01/2023    11:16 AM 05/04/2023  2:03 PM 04/26/2023   11:08 AM 02/12/2023   12:32 PM 02/05/2023    3:59 PM 10/20/2022    2:18 PM  PHQ 2/9 Scores  PHQ - 2 Score 0 0 0 0  0 0  PHQ- 9 Score  1  3  2  0  Exception Documentation     Patient refusal      Fall Risk    11/17/2023    1:39 PM 11/01/2023   11:16 AM 05/04/2023    2:03 PM 04/26/2023   11:08 AM 10/20/2022    2:21 PM  Fall Risk   Falls in the past year? 0 0 0 0 0  Number falls in past yr: 0    0  Injury with Fall? 0    0  Risk for fall due to : No Fall Risks    No Fall Risks  Follow up Education provided;Falls prevention discussed    Falls prevention discussed;Falls evaluation completed    MEDICARE RISK AT HOME: Medicare Risk at Home Any stairs in or around the home?: No If so, are there any without handrails?: No Home free of loose throw rugs in walkways, pet beds, electrical cords, etc?: Yes Adequate lighting in your home to reduce risk of falls?: Yes Life alert?: No Use of a cane, walker or w/c?: Yes (as needed) Grab bars in the bathroom?: Yes Shower chair or bench in shower?: No Elevated toilet seat or a handicapped toilet?: No  TIMED UP AND GO:  Was the test performed?  No    Cognitive Function:    10/04/2020    9:48 AM 09/26/2018    8:58 AM 09/14/2017    9:22 AM 09/11/2016    3:15 PM  MMSE - Mini Mental State Exam  Orientation to time 5 5 5 5   Orientation to Place 5 5 5 5   Registration 3 3 3 3   Attention/ Calculation 5 0 0 0  Recall 3 3 3 3   Language- name 2 objects  0 0 0  Language- repeat 1 1 1 1   Language- follow 3 step command  3 3 3   Language- read & follow direction  0 0 0  Write a sentence  0 0 0  Copy design  0 0 0  Total score  20 20 20         11/17/2023    1:48 PM 10/20/2022    2:22 PM  6CIT Screen  What Year? 0 points 0 points  What month? 0 points 0 points  What time? 0 points 0 points  Count back from 20 0 points 0 points  Months in reverse 0 points 0 points  Repeat phrase 0 points 0 points   Total Score 0 points 0 points    Immunizations Immunization History  Administered Date(s) Administered   Fluad Quad(high Dose 65+) 10/04/2019, 10/11/2020, 10/13/2021, 10/26/2022   Fluad Trivalent(High Dose 65+) 11/01/2023   Influenza Split 10/30/2013   Influenza,inj,Quad PF,6+ Mos 09/14/2017, 09/26/2018   Influenza-Unspecified 09/01/2016   PFIZER(Purple Top)SARS-COV-2 Vaccination 02/03/2020, 02/24/2020, 12/13/2020   Pfizer Covid-19 Vaccine Bivalent Booster 35yrs & up 11/27/2021   Pfizer(Comirnaty)Fall Seasonal Vaccine 12 years and older 11/12/2022   Pneumococcal Conjugate-13 09/08/2016   Pneumococcal Polysaccharide-23 09/14/2017   Zoster Recombinant(Shingrix) 11/12/2022, 03/10/2023    TDAP status: Up to date  Flu Vaccine status: Up to date  Pneumococcal vaccine status: Up to date  Covid-19 vaccine status: Completed vaccines  Qualifies for Shingles Vaccine? Yes   Zostavax completed Yes   Shingrix Completed?:  Yes  Screening Tests Health Maintenance  Topic Date Due   COVID-19 Vaccine (6 - 2023-24 season) 12/03/2023 (Originally 08/29/2023)   DTaP/Tdap/Td (1 - Tdap) 09/15/2027 (Originally 03/17/1960)   HEMOGLOBIN A1C  04/18/2024   FOOT EXAM  04/25/2024   OPHTHALMOLOGY EXAM  09/01/2024   Diabetic kidney evaluation - eGFR measurement  09/26/2024   Diabetic kidney evaluation - Urine ACR  10/18/2024   Medicare Annual Wellness (AWV)  11/16/2024   Pneumonia Vaccine 97+ Years old  Completed   INFLUENZA VACCINE  Completed   Zoster Vaccines- Shingrix  Completed   HPV VACCINES  Aged Out   Hepatitis C Screening  Discontinued    Health Maintenance  There are no preventive care reminders to display for this patient.   Colorectal cancer screening: No longer required.   Lung Cancer Screening: (Low Dose CT Chest recommended if Age 42-80 years, 20 pack-year currently smoking OR have quit w/in 15years.) does not qualify.   Lung Cancer Screening Referral: no  Additional  Screening:  Hepatitis C Screening: does not qualify; Completed 02/24/2021  Vision Screening: Recommended annual ophthalmology exams for early detection of glaucoma and other disorders of the eye. Is the patient up to date with their annual eye exam?  Yes  Who is the provider or what is the name of the office in which the patient attends annual eye exams? [facility] If pt is not established with a provider, would they like to be referred to a provider to establish care? No .   Dental Screening: Recommended annual dental exams for proper oral hygiene  Diabetic Foot Exam: Diabetic Foot Exam: Completed 04/26/23  Community Resource Referral / Chronic Care Management: CRR required this visit?  No   CCM required this visit?  No    Plan:     I have personally reviewed and noted the following in the patient's chart:   Medical and social history Use of alcohol, tobacco or illicit drugs  Current medications and supplements including opioid prescriptions. Patient is not currently taking opioid prescriptions. Functional ability and status Nutritional status Physical activity Advanced directives List of other physicians Hospitalizations, surgeries, and ER visits in previous 12 months Vitals Screenings to include cognitive, depression, and falls Referrals and appointments  In addition, I have reviewed and discussed with patient certain preventive protocols, quality metrics, and best practice recommendations. A written personalized care plan for preventive services as well as general preventive health recommendations were provided to patient.    Sue Lush, LPN   [id number]   After Visit Summary: (Declined) Due to this being a telephonic visit, with patients personalized plan was offered to patient but patient Declined AVS at this time   Nurse Notes: The patient states he is doing well and has no concerns or questions at this time.

## 2023-11-17 NOTE — Patient Instructions (Signed)
Keith Jordan , Thank you for taking time to come for your Medicare Wellness Visit. I appreciate your ongoing commitment to your health goals. Please review the following plan we discussed and let me know if I can assist you in the future.   Referrals/Orders/Follow-Ups/Clinician Recommendations: none  This is a list of the screening recommended for you and due dates:  Health Maintenance  Topic Date Due   COVID-19 Vaccine (6 - 2023-24 season) 12/03/2023*   DTaP/Tdap/Td vaccine (1 - Tdap) 09/15/2027*   Hemoglobin A1C  04/18/2024   Complete foot exam   04/25/2024   Eye exam for diabetics  09/01/2024   Yearly kidney function blood test for diabetes  09/26/2024   Yearly kidney health urinalysis for diabetes  10/18/2024   Medicare Annual Wellness Visit  11/16/2024   Pneumonia Vaccine  Completed   Flu Shot  Completed   Zoster (Shingles) Vaccine  Completed   HPV Vaccine  Aged Out   Hepatitis C Screening  Discontinued  *Topic was postponed. The date shown is not the original due date.    Advanced directives: (In Chart) A copy of your advanced directives are scanned into your chart should your provider ever need it.  Next Medicare Annual Wellness Visit scheduled for next year: Yes 11/17/2024 @ 1:40pm telephone

## 2023-11-22 ENCOUNTER — Other Ambulatory Visit: Payer: Self-pay | Admitting: Cardiology

## 2023-11-22 DIAGNOSIS — I483 Typical atrial flutter: Secondary | ICD-10-CM

## 2023-11-22 NOTE — Telephone Encounter (Signed)
Prescription refill request for Eliquis received. Indication: A Flutter Last office visit: 04/09/23  Antonieta Iba MD Scr: 1.14 on 09/27/23  Epic Age: 82 Weight: 66.7kg  Based on above findings Eliquis 5mg  twice daily is the appropriate dose.  Refill approved.

## 2023-12-06 ENCOUNTER — Telehealth: Payer: Self-pay | Admitting: Family Medicine

## 2023-12-06 NOTE — Telephone Encounter (Signed)
Patient contaced the office regarding visit from 11/01/2023 at 11/17/2023. On 11/4, patient had annual physical with Dr. Reece Agar. On 11/20, patient had medicare wellness with [facility]. Patient is now being billed for 2 medicare wellness visits, it seems like the visit with Dr. Sharen Hones may have been documented as a medicare annual wellness visit. Can you look into this?

## 2023-12-26 ENCOUNTER — Other Ambulatory Visit: Payer: Self-pay | Admitting: Nurse Practitioner

## 2023-12-26 DIAGNOSIS — C8307 Small cell B-cell lymphoma, spleen: Secondary | ICD-10-CM

## 2023-12-27 ENCOUNTER — Inpatient Hospital Stay: Payer: Medicare HMO | Attending: Hematology and Oncology

## 2023-12-27 ENCOUNTER — Telehealth: Payer: Self-pay | Admitting: *Deleted

## 2023-12-27 DIAGNOSIS — D649 Anemia, unspecified: Secondary | ICD-10-CM | POA: Insufficient documentation

## 2023-12-27 DIAGNOSIS — Z83518 Family history of other specified eye disorder: Secondary | ICD-10-CM | POA: Insufficient documentation

## 2023-12-27 DIAGNOSIS — Z7901 Long term (current) use of anticoagulants: Secondary | ICD-10-CM | POA: Diagnosis not present

## 2023-12-27 DIAGNOSIS — Z825 Family history of asthma and other chronic lower respiratory diseases: Secondary | ICD-10-CM | POA: Diagnosis not present

## 2023-12-27 DIAGNOSIS — Z8261 Family history of arthritis: Secondary | ICD-10-CM | POA: Insufficient documentation

## 2023-12-27 DIAGNOSIS — Z79899 Other long term (current) drug therapy: Secondary | ICD-10-CM | POA: Diagnosis not present

## 2023-12-27 DIAGNOSIS — R161 Splenomegaly, not elsewhere classified: Secondary | ICD-10-CM | POA: Diagnosis not present

## 2023-12-27 DIAGNOSIS — C8307 Small cell B-cell lymphoma, spleen: Secondary | ICD-10-CM

## 2023-12-27 DIAGNOSIS — Z8572 Personal history of non-Hodgkin lymphomas: Secondary | ICD-10-CM | POA: Insufficient documentation

## 2023-12-27 DIAGNOSIS — I4892 Unspecified atrial flutter: Secondary | ICD-10-CM | POA: Insufficient documentation

## 2023-12-27 DIAGNOSIS — R110 Nausea: Secondary | ICD-10-CM | POA: Insufficient documentation

## 2023-12-27 LAB — CBC WITH DIFFERENTIAL (CANCER CENTER ONLY)
Abs Immature Granulocytes: 0.35 10*3/uL — ABNORMAL HIGH (ref 0.00–0.07)
Basophils Absolute: 0.1 10*3/uL (ref 0.0–0.1)
Basophils Relative: 0 %
Eosinophils Absolute: 0.6 10*3/uL — ABNORMAL HIGH (ref 0.0–0.5)
Eosinophils Relative: 0 %
HCT: 37.3 % — ABNORMAL LOW (ref 39.0–52.0)
Hemoglobin: 11.4 g/dL — ABNORMAL LOW (ref 13.0–17.0)
Immature Granulocytes: 0 %
Lymphocytes Relative: 96 %
Lymphs Abs: 223.2 10*3/uL — ABNORMAL HIGH (ref 0.7–4.0)
MCH: 30.8 pg (ref 26.0–34.0)
MCHC: 30.6 g/dL (ref 30.0–36.0)
MCV: 100.8 fL — ABNORMAL HIGH (ref 80.0–100.0)
Monocytes Absolute: 7.8 10*3/uL — ABNORMAL HIGH (ref 0.1–1.0)
Monocytes Relative: 3 %
Neutro Abs: 2.4 10*3/uL (ref 1.7–7.7)
Neutrophils Relative %: 1 %
Platelet Count: 97 10*3/uL — ABNORMAL LOW (ref 150–400)
RBC: 3.7 MIL/uL — ABNORMAL LOW (ref 4.22–5.81)
RDW: 15.2 % (ref 11.5–15.5)
Smear Review: NORMAL
WBC Count: 234.4 10*3/uL (ref 4.0–10.5)
WBC Morphology: ABNORMAL
nRBC: 0 % (ref 0.0–0.2)

## 2023-12-27 LAB — CMP (CANCER CENTER ONLY)
ALT: 13 U/L (ref 0–44)
AST: 29 U/L (ref 15–41)
Albumin: 3.9 g/dL (ref 3.5–5.0)
Alkaline Phosphatase: 67 U/L (ref 38–126)
Anion gap: 6 (ref 5–15)
BUN: 26 mg/dL — ABNORMAL HIGH (ref 8–23)
CO2: 33 mmol/L — ABNORMAL HIGH (ref 22–32)
Calcium: 9 mg/dL (ref 8.9–10.3)
Chloride: 103 mmol/L (ref 98–111)
Creatinine: 1.1 mg/dL (ref 0.61–1.24)
GFR, Estimated: >60 mL/min (ref >60)
Glucose, Bld: 130 mg/dL — ABNORMAL HIGH (ref 70–99)
Potassium: 4.2 mmol/L (ref 3.5–5.1)
Sodium: 142 mmol/L (ref 135–145)
Total Bilirubin: 0.5 mg/dL (ref 0.0–1.2)
Total Protein: 6.5 g/dL (ref 6.5–8.1)

## 2023-12-27 LAB — LACTATE DEHYDROGENASE: LDH: 163 U/L (ref 98–192)

## 2023-12-27 NOTE — Telephone Encounter (Signed)
CRITICAL VALUE STICKER  CRITICAL VALUE: WBC 234.4  RECEIVER (on-site recipient of call): Kim RN  DATE & TIME NOTIFIED: 12/27/2023 @ 10:59 am  MESSENGER (representative from lab): Lanora Manis  MD NOTIFIED: Cassie  TIME OF NOTIFICATION: 11:01 am  RESPONSE:  Will discuss action with direct nurse

## 2024-01-16 ENCOUNTER — Other Ambulatory Visit: Payer: Self-pay | Admitting: Cardiology

## 2024-01-27 ENCOUNTER — Ambulatory Visit: Payer: Medicare HMO | Attending: Cardiology | Admitting: Cardiology

## 2024-01-27 ENCOUNTER — Encounter: Payer: Self-pay | Admitting: Cardiology

## 2024-01-27 VITALS — BP 138/62 | HR 59 | Ht 69.0 in | Wt 148.4 lb

## 2024-01-27 DIAGNOSIS — R002 Palpitations: Secondary | ICD-10-CM

## 2024-01-27 DIAGNOSIS — I10 Essential (primary) hypertension: Secondary | ICD-10-CM | POA: Diagnosis not present

## 2024-01-27 DIAGNOSIS — I483 Typical atrial flutter: Secondary | ICD-10-CM | POA: Diagnosis not present

## 2024-01-27 MED ORDER — APIXABAN 5 MG PO TABS: 5.0000 mg | ORAL_TABLET | Freq: Two times a day (BID) | ORAL | Status: DC

## 2024-01-27 NOTE — Patient Instructions (Addendum)
Medication Instructions:  Your physician recommends that you continue on your current medications as directed. Please refer to the Current Medication list given to you today.  *If you need a refill on your cardiac medications before your next appointment, please call your pharmacy*  Follow-Up: At [facility], you and your health needs are our priority.  As part of our continuing mission to provide you with exceptional heart care, we have created designated Provider Care Teams.  These Care Teams include your primary Cardiologist (physician) and Advanced Practice Providers (APPs -  Physician Assistants and Nurse Practitioners) who all work together to provide you with the care you need, when you need it.   Your next appointment:   1 year(s)  Provider:   Thomasene Ripple, DO

## 2024-01-27 NOTE — Progress Notes (Signed)
Cardiology Office Note:    Date:  01/27/2024   ID:  Keith Jordan, DOB 11-17-1941, MRN [id number]  PCP:  Eustaquio Boyden, MD  Cardiologist:  Thomasene Ripple, DO  Electrophysiologist:  None   Referring MD: Eustaquio Boyden, MD   " I am doing well"  History of Present Illness:    Keith Jordan is a 83 y.o. 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   I saw the patient on 04/17/2021 and at that time he reported intermittent palpitations, therefore a zio live monitor was placed on the patient. Two days of wearing the monitor, atrial flutter was noted and we started the patient on anticoagulation with Eliquis ( CHADS2 VASc score of 2). He was also started on Cardizem 120 mg daily.   I last saw the patient on August 02, 2019.  At that time we continued his Cardizem.  Since I saw the patient he also had seen EP.  He had maintained sinus rhythm.  Eliquis was stopped.  I saw the patient on 02/03/2022 at that time he had not tolerated the metoprolol so I stopped the metoprolol and started low-dose carvedilol.  He also had not tolerated Cardizem.  At his last visit he was experiencing some symptoms placed a monitor on the patient which came back with no evidence of A-fib.  Today he offers no complaints.  Past Medical History:  Diagnosis Date   Abnormal breath sounds 10/11/2020   Acute sinusitis 01/29/2021   Advanced care planning/counseling discussion 09/01/2016   BPH (benign prostatic hyperplasia) 09/29/2018   Ear fullness, bilateral 04/03/2021   Health maintenance examination 09/01/2016   Hyperlipidemia    Irregular heart beat 03/24/2021   Ischemic optic neuropathy 2006   [place] [facility]   Leukocytosis 01/30/2021   Severe s/p ER eval 01/2021   Medicare annual wellness visit, subsequent 10/04/2019   Nausea without vomiting 03/24/2021   Personal history of colonic adenoma  07/30/2008   Prediabetes 07/05/2016   A1c 6.5% 03/2014    Prediabetes 07/05/2016   A1c 6.5% 03/2014    Primary osteoarthritis of knee    bilateral s/p L knee replacement, receives R knee injections [name], [name])   Sebaceous cyst 01/30/2020   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    Past Surgical History:  Procedure Laterality Date   A-FLUTTER ABLATION N/A 07/11/2021   Procedure: A-FLUTTER ABLATION;  Surgeon: [name], MD;  Location: [facility];  Service: Cardiovascular;  Laterality: N/A;   COLONOSCOPY  12/2013   no polyps, no rpt due [name])   CYST EXCISION  01/2020   TOTAL KNEE ARTHROPLASTY Left 2005   [name]    Current Medications: Current Meds  Medication Sig   acetaminophen (TYLENOL) 500 MG tablet Take 500 mg by mouth every 6 (six) hours as needed for mild pain.   Ascorbic Acid (VITAMIN C) 1000 MG tablet Take 500 mg by mouth 2 (two) times daily.   atorvastatin (LIPITOR) 10 MG tablet Take 1 tablet (10 mg total) by mouth daily.   carboxymethylcellulose (REFRESH PLUS) 0.5 % SOLN Place 1 drop into both eyes 2 (two) times daily as needed (dry eyes).   carvedilol (COREG) 6.25 MG tablet TAKE 1 TABLET BY MOUTH TWICE A DAY   doxycycline (VIBRA-TABS) 100 MG tablet Take 1 tablet (100 mg total) by mouth 2 (two) times daily.   ELIQUIS 5 MG TABS tablet  TAKE 1 TABLET BY MOUTH TWICE A DAY   L-Theanine 200 MG CAPS Take 1 Capful by mouth at bedtime as needed (sleep maintenance insomnia).   Multiple Vitamins-Minerals (MULTIVITAMIN WITH MINERALS) tablet Take 1 tablet by mouth daily. Unknown strenght   Multiple Vitamins-Minerals (PRESERVISION AREDS) CAPS Take 1 capsule by mouth in the morning and at bedtime.   Olopatadine HCl 0.2 % SOLN Apply 1 drop to eye daily. (Both)   sodium chloride (OCEAN) 0.65 % SOLN nasal spray Place 1 spray into both nostrils as needed for congestion.   triamcinolone (NASACORT) 55 MCG/ACT AERO nasal inhaler Place 1 spray into the nose daily  as needed (sinus congestion).     Allergies:   Patient has no known allergies.   Social History   Socioeconomic History   Marital status: Married    Spouse name: Not on file   Number of children: Not on file   Years of education: Not on file   Highest education level: Not on file  Occupational History   Not on file  Tobacco Use   Smoking status: Never   Smokeless tobacco: Never  Vaping Use   Vaping status: Never Used  Substance and Sexual Activity   Alcohol use: Yes    Comment: occasional   Drug use: No   Sexual activity: Not Currently  Other Topics Concern   Not on file  Social History Narrative   Lives with wife, 2 cats   Occ: retired Counsellor   Activity: works in yard   Diet: some water, fruits/vegetables daily   Social Drivers of Corporate investment banker Strain: Low Risk  (11/17/2023)   Overall Financial Resource Strain (CARDIA)    Difficulty of Paying Living Expenses: Not hard at all  Food Insecurity: No Food Insecurity (11/17/2023)   Hunger Vital Sign    Worried About Running Out of Food in the Last Year: Never true    Ran Out of Food in the Last Year: Never true  Transportation Needs: No Transportation Needs (11/17/2023)   PRAPARE - Administrator, Civil Service (Medical): No    Lack of Transportation (Non-Medical): No  Physical Activity: Inactive (11/17/2023)   Exercise Vital Sign    Days of Exercise per Week: 0 days    Minutes of Exercise per Session: 0 min  Stress: No Stress Concern Present (11/17/2023)   [employer] of Occupational Health - Occupational Stress Questionnaire    Feeling of Stress : Only a little  Social Connections: Moderately Isolated (11/17/2023)   Social Connection and Isolation Panel [NHANES]    Frequency of Communication with Friends and Family: Twice a week    Frequency of Social Gatherings with Friends and Family: Once a week    Attends Religious Services: Never    Database administrator or Organizations: No     Attends Engineer, structural: Never    Marital Status: Married     Family History: The patient's family history includes Arthritis in his father; Blindness (age of onset: 19) in his mother; Lung disease in his father. There is no history of Colon cancer, Pancreatic cancer, Stomach cancer, Esophageal cancer, Rectal cancer, Cancer, Diabetes, CAD, or Stroke.  ROS:   Review of Systems  Constitution: Negative for decreased appetite, fever and weight gain.  HENT: Negative for congestion, ear discharge, hoarse voice and sore throat.   Eyes: Negative for discharge, redness, vision loss in right eye and visual halos.  Cardiovascular: Negative for chest pain, dyspnea on  exertion, leg swelling, orthopnea and palpitations.  Respiratory: Negative for cough, hemoptysis, shortness of breath and snoring.   Endocrine: Negative for heat intolerance and polyphagia.  Hematologic/Lymphatic: Negative for bleeding problem. Does not bruise/bleed easily.  Skin: Negative for flushing, nail changes, rash and suspicious lesions.  Musculoskeletal: Negative for arthritis, joint pain, muscle cramps, myalgias, neck pain and stiffness.  Gastrointestinal: Negative for abdominal pain, bowel incontinence, diarrhea and excessive appetite.  Genitourinary: Negative for decreased libido, genital sores and incomplete emptying.  Neurological: Negative for brief paralysis, focal weakness, headaches and loss of balance.  Psychiatric/Behavioral: Negative for altered mental status, depression and suicidal ideas.  Allergic/Immunologic: Negative for HIV exposure and persistent infections.    EKGs/Labs/Other Studies Reviewed:    The following studies were reviewed today:   EKG: Sinus bradycardia, HR 59 bpm  01/01/2023 Patch Wear Time:  13 days and 23 hours (2023-12-18T12:36:37-0500 to 2024-01-01T12:36:29-0500)   Patient had a min HR of 47 bpm, max HR of 81 bpm, and avg HR of 59 bpm. Predominant underlying rhythm was  Sinus Rhythm. First Degree AV Block was present. Isolated SVEs were rare (<1.0%), SVE Couplets were rare (<1.0%), and no SVE Triplets were present.  Isolated VEs were rare (<1.0%), and no VE Couplets or VE Triplets were present.   Symptoms associated with sinus rhythm.   Conclusion: Normal unremarkable study.    TTE 2022 IMPRESSIONS  1. Left ventricular ejection fraction, by estimation, is 70 to 75%. The  left ventricle has hyperdynamic function. The left ventricle has no  regional wall motion abnormalities. Left ventricular diastolic parameters  are consistent with Grade I diastolic  dysfunction (impaired relaxation).   2. Right ventricular systolic function is normal. The right ventricular  size is mildly enlarged. There is mildly elevated pulmonary artery  systolic pressure. The estimated right ventricular systolic pressure is  36.4 mmHg.   3. The mitral valve is normal in structure. No evidence of mitral valve  regurgitation. No evidence of mitral stenosis.   4. The aortic valve is normal in structure. Aortic valve regurgitation is  not visualized. No aortic stenosis is present.   5. The inferior vena cava is normal in size with greater than 50%  respiratory variability, suggesting right atrial pressure of 3 mmHg.   FINDINGS   Left Ventricle: Left ventricular ejection fraction, by estimation, is 70  to 75%. The left ventricle has hyperdynamic function. The left ventricle  has no regional wall motion abnormalities. The left ventricular internal  cavity size was normal in size.  There is no left ventricular hypertrophy. Left ventricular diastolic  parameters are consistent with Grade I diastolic dysfunction (impaired  relaxation).   Right Ventricle: The right ventricular size is mildly enlarged. No  increase in right ventricular wall thickness. Right ventricular systolic  function is normal. There is mildly elevated pulmonary artery systolic  pressure. The tricuspid  regurgitant  velocity is 2.89 m/s, and with an assumed right atrial pressure of 3 mmHg,  the estimated right ventricular systolic pressure is 36.4 mmHg.   Left Atrium: Left atrial size was normal in size.   Right Atrium: Right atrial size was normal in size.   Pericardium: There is no evidence of pericardial effusion.   Mitral Valve: The mitral valve is normal in structure. No evidence of  mitral valve regurgitation. No evidence of mitral valve stenosis.   Tricuspid Valve: The tricuspid valve is normal in structure. Tricuspid  valve regurgitation is mild . No evidence of tricuspid stenosis.   Aortic  Valve: The aortic valve is normal in structure. Aortic valve  regurgitation is not visualized. No aortic stenosis is present.   Pulmonic Valve: The pulmonic valve was normal in structure. Pulmonic valve  regurgitation is not visualized. No evidence of pulmonic stenosis.   Aorta: The aortic root is normal in size and structure.   Venous: The inferior vena cava is normal in size with greater than 50%  respiratory variability, suggesting right atrial pressure of 3 mmHg.   IAS/Shunts: No atrial level shunt detected by color flow Doppler.        Zio monitor The patient wore the monitor for 13 days, 21 hours starting April 17, 2021.   Indication: Palpitations   The minimum heart rate was 46 bpm, maximum heart rate was 149 bpm, and average heart rate was 68 bpm. Predominant underlying rhythm was Sinus Rhythm.   3 Supraventricular Tachycardia runs occurred, the run with the fastest interval lasting 4 beats with a max rate of 126 bpm, the longest lasting 7 beats with an avg rate of 103 bpm.     Atrial Flutter occurred (3% burden), ranging from 46-149 bpm (avg of 82 bpm), the longest lasting 1 hour 41 mins with an avg rate of 92 bpm.   Premature atrial complexes were occasional (1.0%, 13493). Premature Ventricular complexes were rare less than 1%. No ventricular tachycardia, no pauses  present.   Symptoms are associated with atrial flutter, sinus rhythm and premature atrial complexes.       Conclusion: This study is remarkable for the following:                       1.  Symptomatic atrial flutter                       2.  Occasional premature atrial complexes.    Recent Labs: 12/27/2023: ALT 13; BUN 26; Creatinine 1.10; Hemoglobin 11.4; Platelet Count 97; Potassium 4.2; Sodium 142  Recent Lipid Panel    Component Value Date/Time   CHOL 104 10/19/2023 0840   CHOL 140 04/16/2015 0000   CHOL 140 04/16/2015 0000   TRIG 97.0 10/19/2023 0840   TRIG 94 04/16/2015 0000   TRIG 94 04/16/2015 0000   HDL 28.30 (L) 10/19/2023 0840   CHOLHDL 4 10/19/2023 0840   VLDL 19.4 10/19/2023 0840   LDLCALC 57 10/19/2023 0840   LDLCALC 86 04/16/2015 0000   LDLCALC 86 04/16/2015 0000    Physical Exam:    VS:  BP 138/62 (BP Location: Right Arm, Patient Position: Sitting, Cuff Size: Normal)   Pulse (!) 59   Ht 5\' 9"  (1.753 m)   Wt 148 lb 6.4 oz (67.3 kg)   SpO2 95%   BMI 21.91 kg/m     Wt Readings from Last 3 Encounters:  01/27/24 148 lb 6.4 oz (67.3 kg)  11/17/23 139 lb (63 kg)  11/01/23 139 lb (63 kg)     GEN: Well nourished, well developed in no acute distress HEENT: Normal NECK: No JVD; No carotid bruits LYMPHATICS: No lymphadenopathy CARDIAC: S1S2 noted,RRR, no murmurs, rubs, gallops RESPIRATORY:  Clear to auscultation without rales, wheezing or rhonchi  ABDOMEN: Soft, non-tender, non-distended, +bowel sounds, no guarding. EXTREMITIES: No edema, No cyanosis, no clubbing MUSCULOSKELETAL:  No deformity  SKIN: Warm and dry NEUROLOGIC:  Alert and oriented x 3, non-focal PSYCHIATRIC:  Normal affect, good insight  ASSESSMENT:    1. Palpitations   2. Typical atrial flutter (HCC)  3. Hypertension, unspecified type     PLAN:    He appears to be doing well from a cardiovascular standpoint.  No changes in his medication today.  Continue his anticoagulation  Eliquis 5 mg twice daily.  He is planning dental work from a cardiovascular standpoint he is care.  Does not need any antibiotics.  The patient is in agreement with the above plan. The patient left the office in stable condition.  The patient will follow up in 6 months or sooner if needed.  Medication Adjustments/Labs and Tests Ordered: Current medicines are reviewed at length with the patient today.  Concerns regarding medicines are outlined above.  Orders Placed This Encounter  Procedures   EKG 12-Lead   No orders of the defined types were placed in this encounter.   Patient Instructions  Medication Instructions:  Your physician recommends that you continue on your current medications as directed. Please refer to the Current Medication list given to you today.  *If you need a refill on your cardiac medications before your next appointment, please call your pharmacy*  Follow-Up: At [facility], you and your health needs are our priority.  As part of our continuing mission to provide you with exceptional heart care, we have created designated Provider Care Teams.  These Care Teams include your primary Cardiologist (physician) and Advanced Practice Providers (APPs -  Physician Assistants and Nurse Practitioners) who all work together to provide you with the care you need, when you need it.   Your next appointment:   1 year(s)  Provider:   Thomasene Ripple, DO     Adopting a Healthy Lifestyle.  Know what a healthy weight is for you (roughly BMI <25) and aim to maintain this   Aim for 7+ servings of fruits and vegetables daily   65-80+ fluid ounces of water or unsweet tea for healthy kidneys   Limit to max 1 drink of alcohol per day; avoid smoking/tobacco   Limit animal fats in diet for cholesterol and heart health - choose grass fed whenever available   Avoid highly processed foods, and foods high in saturated/trans fats   Aim for low stress - take time to unwind and  care for your mental health   Aim for 150 min of moderate intensity exercise weekly for heart health, and weights twice weekly for bone health   Aim for 7-9 hours of sleep daily   When it comes to diets, agreement about the perfect plan isnt easy to find, even among the experts. Experts at the [facility] of [employer] developed an idea known as the Healthy Eating Plate. Just imagine a plate divided into logical, healthy portions.   The emphasis is on diet quality:   Load up on vegetables and fruits - one-half of your plate: Aim for color and variety, and remember that potatoes dont count.   Go for whole grains - one-quarter of your plate: Whole wheat, barley, wheat berries, quinoa, oats, brown rice, and foods made with them. If you want pasta, go with whole wheat pasta.   Protein power - one-quarter of your plate: Fish, chicken, beans, and nuts are all healthy, versatile protein sources. Limit red meat.   The diet, however, does go beyond the plate, offering a few other suggestions.   Use healthy plant oils, such as olive, canola, soy, corn, sunflower and peanut. Check the labels, and avoid partially hydrogenated oil, which have unhealthy trans fats.   If youre thirsty, drink water. Coffee and  tea are good in moderation, but skip sugary drinks and limit milk and dairy products to one or two daily servings.   The type of carbohydrate in the diet is more important than the amount. Some sources of carbohydrates, such as vegetables, fruits, whole grains, and beans-are healthier than others.   Finally, stay active  Signed, Thomasene Ripple, DO  01/27/2024 2:16 PM    [facility]

## 2024-03-09 DIAGNOSIS — H47011 Ischemic optic neuropathy, right eye: Secondary | ICD-10-CM | POA: Diagnosis not present

## 2024-03-09 DIAGNOSIS — H25013 Cortical age-related cataract, bilateral: Secondary | ICD-10-CM | POA: Diagnosis not present

## 2024-03-09 DIAGNOSIS — H353132 Nonexudative age-related macular degeneration, bilateral, intermediate dry stage: Secondary | ICD-10-CM | POA: Diagnosis not present

## 2024-03-24 ENCOUNTER — Inpatient Hospital Stay: Payer: Medicare HMO

## 2024-03-24 ENCOUNTER — Inpatient Hospital Stay: Payer: Medicare HMO | Attending: Hematology and Oncology | Admitting: Hematology and Oncology

## 2024-03-24 ENCOUNTER — Other Ambulatory Visit: Payer: Self-pay | Admitting: Hematology and Oncology

## 2024-03-24 VITALS — BP 134/55 | HR 97 | Temp 97.4°F | Resp 16 | Wt 146.5 lb

## 2024-03-24 DIAGNOSIS — D696 Thrombocytopenia, unspecified: Secondary | ICD-10-CM | POA: Insufficient documentation

## 2024-03-24 DIAGNOSIS — R110 Nausea: Secondary | ICD-10-CM | POA: Diagnosis not present

## 2024-03-24 DIAGNOSIS — I4892 Unspecified atrial flutter: Secondary | ICD-10-CM | POA: Insufficient documentation

## 2024-03-24 DIAGNOSIS — Z8572 Personal history of non-Hodgkin lymphomas: Secondary | ICD-10-CM | POA: Diagnosis not present

## 2024-03-24 DIAGNOSIS — C8307 Small cell B-cell lymphoma, spleen: Secondary | ICD-10-CM

## 2024-03-24 DIAGNOSIS — Z7901 Long term (current) use of anticoagulants: Secondary | ICD-10-CM | POA: Insufficient documentation

## 2024-03-24 DIAGNOSIS — D72820 Lymphocytosis (symptomatic): Secondary | ICD-10-CM

## 2024-03-24 LAB — CBC WITH DIFFERENTIAL (CANCER CENTER ONLY)
Abs Immature Granulocytes: 0.5 10*3/uL — ABNORMAL HIGH (ref 0.00–0.07)
Basophils Absolute: 0.1 10*3/uL (ref 0.0–0.1)
Basophils Relative: 0 %
Eosinophils Absolute: 0.6 10*3/uL — ABNORMAL HIGH (ref 0.0–0.5)
Eosinophils Relative: 0 %
HCT: 35.8 % — ABNORMAL LOW (ref 39.0–52.0)
Hemoglobin: 10.9 g/dL — ABNORMAL LOW (ref 13.0–17.0)
Immature Granulocytes: 0 %
Lymphocytes Relative: 96 %
Lymphs Abs: 218.4 10*3/uL — ABNORMAL HIGH (ref 0.7–4.0)
MCH: 31.1 pg (ref 26.0–34.0)
MCHC: 30.4 g/dL (ref 30.0–36.0)
MCV: 102.3 fL — ABNORMAL HIGH (ref 80.0–100.0)
Monocytes Absolute: 6.5 10*3/uL — ABNORMAL HIGH (ref 0.1–1.0)
Monocytes Relative: 3 %
Neutro Abs: 2.9 10*3/uL (ref 1.7–7.7)
Neutrophils Relative %: 1 %
Platelet Count: 95 10*3/uL — ABNORMAL LOW (ref 150–400)
RBC: 3.5 MIL/uL — ABNORMAL LOW (ref 4.22–5.81)
RDW: 14.7 % (ref 11.5–15.5)
Smear Review: NORMAL
WBC Count: 228.9 10*3/uL (ref 4.0–10.5)
WBC Morphology: ABNORMAL
nRBC: 0 % (ref 0.0–0.2)

## 2024-03-24 LAB — LACTATE DEHYDROGENASE: LDH: 165 U/L (ref 98–192)

## 2024-03-24 LAB — CMP (CANCER CENTER ONLY)
ALT: 9 U/L (ref 0–44)
AST: 25 U/L (ref 15–41)
Albumin: 3.9 g/dL (ref 3.5–5.0)
Alkaline Phosphatase: 76 U/L (ref 38–126)
Anion gap: 4 — ABNORMAL LOW (ref 5–15)
BUN: 23 mg/dL (ref 8–23)
CO2: 33 mmol/L — ABNORMAL HIGH (ref 22–32)
Calcium: 9 mg/dL (ref 8.9–10.3)
Chloride: 105 mmol/L (ref 98–111)
Creatinine: 1.02 mg/dL (ref 0.61–1.24)
GFR, Estimated: >60 mL/min (ref >60)
Glucose, Bld: 168 mg/dL — ABNORMAL HIGH (ref 70–99)
Potassium: 4.2 mmol/L (ref 3.5–5.1)
Sodium: 142 mmol/L (ref 135–145)
Total Bilirubin: 0.6 mg/dL (ref 0.0–1.2)
Total Protein: 6.4 g/dL — ABNORMAL LOW (ref 6.5–8.1)

## 2024-03-24 NOTE — Progress Notes (Signed)
 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 Referred to Emergency Department. 02/05/2021: establish care with Dr. Leonides Schanz  02/24/2021: Week 1 of Monotherapy Rituximab 03/03/2021:  Week 2 of Monotherapy Rituximab 03/10/2021:  Week 3 of Monotherapy Rituximab. WBC 109.8 03/17/2021: Week 4 of Monotherapy Rituximab. WBC 94.8 03/24/2021: WBC 59.8 05/01/2021: WBC 89.1, Hgb 13.5, Plt 136 09/03/2021: WBC 97.8, Hgb 12.9, Plt 141 12/03/2021: WBC 137.8, Hgb 13.6, MCV 96.1, Plt 130 03/04/2022: WBC 131.1, Hgb 12.0, MCV 97.7, Plt 121 06/04/2022: WBC 144.9, Hgb 12.5, MCV 96.5, Plt 156 09/04/2022: WBC 182.3, Plt 12.3, MCV 96.9, Plt 126  Interval History:  Keith Jordan 83 y.o. male with medical history significant for splenic marginal zone lymphoma who presents for a follow up visit. The patient's last visit was on 09/27/2023. In the interim since the last visit he has been in stable health.   On exam today Keith Jordan reports he has been well overall with interim since her last visit with no major changes in his health.  He is had no new medications, hospitalizations, or ER visits.  He reports his energy and appetite are strong.  He reports that he currently lives with his wife and 2 cats and they are both doing well.  He reports that he has had no issues with viral illnesses such as runny nose, sore throat, cough.  He is had no flu or norovirus.  He denies any bumps or lumps concerning for lymphadenopathy and denies any abdominal discomfort in her early  satiety.  He reports that he does have some occasional mucus production which he takes Mucinex.  He reports that he tends to feel better in the spring and a little worse in the winter.  He is tolerating his Eliquis well with some easy bruising but no bleeding or dark stools.  He denies any fevers, chills, sweats, nausea, vomiting or diarrhea.  A full 10 point ROS is listed below.  MEDICAL HISTORY:  Past Medical History:  Diagnosis Date   Abnormal breath sounds 10/11/2020   Acute sinusitis 01/29/2021   Advanced care planning/counseling discussion 09/01/2016   BPH (benign prostatic hyperplasia) 09/29/2018   Ear fullness, bilateral 04/03/2021   Health maintenance examination 09/01/2016   Hyperlipidemia    Irregular heart beat 03/24/2021   Ischemic optic neuropathy 2006   [facility] at [facility]   Leukocytosis 01/30/2021   Severe s/p ER eval 01/2021   Medicare annual wellness visit, subsequent 10/04/2019   Nausea without vomiting 03/24/2021   Personal history of colonic adenoma 07/30/2008   Prediabetes 07/05/2016   A1c 6.5% 03/2014    Prediabetes 07/05/2016   A1c 6.5% 03/2014    Primary osteoarthritis of knee    bilateral s/p L knee replacement, receives R knee injections [name], [name])   Sebaceous cyst 01/30/2020   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    SURGICAL HISTORY: Past Surgical History:  Procedure Laterality Date   A-FLUTTER ABLATION N/A 07/11/2021   Procedure: A-FLUTTER ABLATION;  Surgeon: [name], Keith Jordan;  [facility];  Service: Cardiovascular;  Laterality: N/A;   COLONOSCOPY  12/2013  no polyps, no rpt due [name])   CYST EXCISION  01/2020   TOTAL KNEE ARTHROPLASTY Left 2005   [name]    SOCIAL HISTORY: Social History   Socioeconomic History   Marital status: Married    Spouse name: Not on file   Number of children: Not on file   Years of education: Not on file   Highest education level: Not on file  Occupational History   Not on file   Tobacco Use   Smoking status: Never   Smokeless tobacco: Never  Vaping Use   Vaping status: Never Used  Substance and Sexual Activity   Alcohol use: Yes    Comment: occasional   Drug use: No   Sexual activity: Not Currently  Other Topics Concern   Not on file  Social History Narrative   Lives with wife, 2 cats   Occ: retired Counsellor   Activity: works in yard   Diet: some water, fruits/vegetables daily   Social Drivers of Corporate investment banker Strain: Low Risk  (11/17/2023)   Overall Financial Resource Strain (CARDIA)    Difficulty of Paying Living Expenses: Not hard at all  Food Insecurity: No Food Insecurity (11/17/2023)   Hunger Vital Sign    Worried About Running Out of Food in the Last Year: Never true    Ran Out of Food in the Last Year: Never true  Transportation Needs: No Transportation Needs (11/17/2023)   PRAPARE - Administrator, Civil Service (Medical): No    Lack of Transportation (Non-Medical): No  Physical Activity: Inactive (11/17/2023)   Exercise Vital Sign    Days of Exercise per Week: 0 days    Minutes of Exercise per Session: 0 min  Stress: No Stress Concern Present (11/17/2023)   [employer] of Occupational Health - Occupational Stress Questionnaire    Feeling of Stress : Only a little  Social Connections: Moderately Isolated (11/17/2023)   Social Connection and Isolation Panel [NHANES]    Frequency of Communication with Friends and Family: Twice a week    Frequency of Social Gatherings with Friends and Family: Once a week    Attends Religious Services: Never    Database administrator or Organizations: No    Attends Banker Meetings: Never    Marital Status: Married  Catering manager Violence: Not At Risk (11/17/2023)   Humiliation, Afraid, Rape, and Kick questionnaire    Fear of Current or Ex-Partner: No    Emotionally Abused: No    Physically Abused: No    Sexually Abused: No    FAMILY HISTORY: Family  History  Problem Relation Age of Onset   Blindness Mother 48       ?stroke in eyes   Lung disease Father        coal miner   Arthritis Father    Colon cancer Neg Hx    Pancreatic cancer Neg Hx    Stomach cancer Neg Hx    Esophageal cancer Neg Hx    Rectal cancer Neg Hx    Cancer Neg Hx    Diabetes Neg Hx    CAD Neg Hx    Stroke Neg Hx     ALLERGIES:  has no known allergies.  MEDICATIONS:  Current Outpatient Medications  Medication Sig Dispense Refill   acetaminophen (TYLENOL) 500 MG tablet Take 500 mg by mouth every 6 (six) hours as needed for mild pain.     apixaban (ELIQUIS) 5 MG TABS tablet Take 1  tablet (5 mg total) by mouth 2 (two) times daily.     Ascorbic Acid (VITAMIN C) 1000 MG tablet Take 500 mg by mouth 2 (two) times daily.     atorvastatin (LIPITOR) 10 MG tablet Take 1 tablet (10 mg total) by mouth daily. 90 tablet 4   carboxymethylcellulose (REFRESH PLUS) 0.5 % SOLN Place 1 drop into both eyes 2 (two) times daily as needed (dry eyes).     carvedilol (COREG) 6.25 MG tablet TAKE 1 TABLET BY MOUTH TWICE A DAY 180 tablet 0   doxycycline (VIBRA-TABS) 100 MG tablet Take 1 tablet (100 mg total) by mouth 2 (two) times daily. 20 tablet 0   ELIQUIS 5 MG TABS tablet TAKE 1 TABLET BY MOUTH TWICE A DAY 60 tablet 5   L-Theanine 200 MG CAPS Take 1 Capful by mouth at bedtime as needed (sleep maintenance insomnia).     Multiple Vitamins-Minerals (MULTIVITAMIN WITH MINERALS) tablet Take 1 tablet by mouth daily. Unknown strenght     Multiple Vitamins-Minerals (PRESERVISION AREDS) CAPS Take 1 capsule by mouth in the morning and at bedtime. 180 capsule 3   Olopatadine HCl 0.2 % SOLN Apply 1 drop to eye daily. (Both) 2.5 mL 0   sodium chloride (OCEAN) 0.65 % SOLN nasal spray Place 1 spray into both nostrils as needed for congestion.     triamcinolone (NASACORT) 55 MCG/ACT AERO nasal inhaler Place 1 spray into the nose daily as needed (sinus congestion). 1 each    No current  facility-administered medications for this visit.    REVIEW OF SYSTEMS:   Constitutional: ( - ) fevers, ( - )  chills , ( - ) night sweats Eyes: ( - ) blurriness of vision, ( - ) double vision, ( - ) watery eyes Ears, nose, mouth, throat, and face: ( - ) mucositis, ( - ) sore throat Respiratory: ( - ) cough, ( - ) dyspnea, ( - ) wheezes Cardiovascular: ( - ) palpitation, ( - ) chest discomfort, ( - ) lower extremity swelling Gastrointestinal:  ( - ) nausea, ( - ) heartburn, ( - ) change in bowel habits Skin: ( - ) abnormal skin rashes Lymphatics: ( - ) new lymphadenopathy, ( - ) easy bruising Neurological: ( - ) numbness, ( -) tingling, ( - ) new weaknesses Behavioral/Psych: ( - ) mood change, ( - ) new changes  All other systems were reviewed with the patient and are negative.  PHYSICAL EXAMINATION: ECOG PERFORMANCE STATUS: 1 - Symptomatic but completely ambulatory  Vitals:   03/24/24 1145  BP: (!) 134/55  Pulse: 97  Resp: 16  Temp: (!) 97.4 F (36.3 C)  SpO2: 99%      Filed Weights   03/24/24 1145  Weight: 146 lb 8 oz (66.5 kg)      GENERAL: well appearing elderly Caucasian male alert, no distress and comfortable SKIN: skin color, texture, turgor are normal, no rashes or significant lesions EYES: conjunctiva are pink and non-injected, sclera clear LUNGS: clear to auscultation and percussion with normal breathing effort HEART: regular rate & rhythm and no murmurs and no lower extremity edema Musculoskeletal: no cyanosis of digits and no clubbing  PSYCH: alert & oriented x 3, fluent speech NEURO: no focal motor/sensory deficits  LABORATORY DATA:  I have reviewed the data as listed    Latest Ref Rng & Units 03/24/2024   10:57 AM 12/27/2023   10:41 AM 09/27/2023   10:56 AM  CBC  WBC 4.0 - 10.5 K/uL  228.9  234.4  198.5   Hemoglobin 13.0 - 17.0 g/dL 16.1  09.6  04.5   Hematocrit 39.0 - 52.0 % 35.8  37.3  36.7   Platelets 150 - 400 K/uL 95  97  98        Latest  Ref Rng & Units 03/24/2024   10:57 AM 12/27/2023   10:41 AM 09/27/2023   10:56 AM  CMP  Glucose 70 - 99 mg/dL 409  811  914   BUN 8 - 23 mg/dL 23  26  25    Creatinine 0.61 - 1.24 mg/dL 7.82  9.56  2.13   Sodium 135 - 145 mmol/L 142  142  141   Potassium 3.5 - 5.1 mmol/L 4.2  4.2  4.2   Chloride 98 - 111 mmol/L 105  103  102   CO2 22 - 32 mmol/L 33  33  33   Calcium 8.9 - 10.3 mg/dL 9.0  9.0  9.3   Total Protein 6.5 - 8.1 g/dL 6.4  6.5  6.4   Total Bilirubin 0.0 - 1.2 mg/dL 0.6  0.5  0.6   Alkaline Phos 38 - 126 U/L 76  67  75   AST 15 - 41 U/L 25  29  23    ALT 0 - 44 U/L 9  13  8     RADIOGRAPHIC STUDIES: No results found.   ASSESSMENT & PLAN RORIK VESPA 83 y.o. male with medical history significant for splenic marginal zone lymphoma. CT CAP from 02/13/21  showed only splenic involvement and a bone marrow biopsy from 02/21/21 confirmed the diagnosis.  The patient has only leukocytosis and splenomegaly.  The recommendation is monotherapy rituximab 375 mg per metered squared q. 7 days x 4 doses. He completed this on 03/17/2021.   At this time is marginal zone lymphoma appears like an indolent lymphocyte predominant variant.  These tend to behave more like CLL.  Given that I would recommend holding on treatment unless the patient were to have difficulties with splenomegaly, anemia, or worsening thrombocytopenia.  For treatment choices one could consider treatment with Ibrutinib/Zanbrutinib vs Bendamustine + ritux/obinutuzumab. Would need caution with ibrutinib given patient's cardiac history and current anticoagulation therapy.    Patient is doing well without any significant limitations. Labs from today show lymphocytosis and modest stable thrombocytopenia. Plan for patient to return to the clinic in 12 weeks for labs and 6 months for clinic visit.  #Splenic Marginal Zone Lymphoma, stable --findings are consistent with a splenic marginal zone lymphoma. --patient completed bone marrow  biopsy and a CT chest abdomen pelvis. Disease appear limited to peripheral blood and spleen.  --Treatment of choice with confirmed splenic marginal zone lymphoma is rituximab 375 mg/m q. 7 days x 4 doses. He completed this on 03/17/2021.  PLAN:  --Due to persistent lymphocytosis, recommend to continue with allopurinol to prevent TLS. --if there is concern for worsening disease with an indication for treatment can consider treatment with Ibrutinib/Zanbrutinib vs Bendamustine + ritux/obinutuzumab. Would need caution with ibrutinib given patient's cardiac history and current anticoagulation therapy.  --labs today show WBC 228.9, Hgb 10.9, MCV 102.3, Plt 95  --RTC in 6 months with interval 3 month labs   #Thrombocytopenia-improved --Plt today stable at 95, fluctuating.  --likely 2/2 to splenomegaly.   #Nausea --Take Zofran 8 mg q 8 hours PRN.   #Atrial Flutter --patient follows with Cardiology  No orders of the defined types were placed in this encounter.  All questions were answered. The  patient knows to call the clinic with any problems, questions or concerns.  A total of more than 30 minutes were spent on this encounter and over half of that time was spent on counseling and coordination of care as outlined above.   Keith Barns, Keith Jordan Department of Hematology/Oncology [facility] Phone: [phone] Pager: [phone] Email: [email or web address]

## 2024-04-14 ENCOUNTER — Other Ambulatory Visit: Payer: Self-pay | Admitting: Cardiology

## 2024-04-14 NOTE — Telephone Encounter (Signed)
 Rx refill sent to pharmacy.

## 2024-06-03 ENCOUNTER — Other Ambulatory Visit: Payer: Self-pay | Admitting: Cardiology

## 2024-06-03 DIAGNOSIS — I483 Typical atrial flutter: Secondary | ICD-10-CM

## 2024-06-14 ENCOUNTER — Telehealth: Payer: Self-pay | Admitting: Cardiology

## 2024-06-14 DIAGNOSIS — I483 Typical atrial flutter: Secondary | ICD-10-CM

## 2024-06-14 MED ORDER — APIXABAN 5 MG PO TABS: 5.0000 mg | ORAL_TABLET | Freq: Two times a day (BID) | ORAL | 5 refills | Status: DC

## 2024-06-14 NOTE — Telephone Encounter (Signed)
 Pt last saw Dr Emmette Harms 01/27/24, last labs 03/24/24 Creat 1.02, age 83, weight 66.5kg, based on specified criteria pt is on appropriate dosage of Eliquis  5mg  BID for aflutter.  Will refill rx.

## 2024-06-14 NOTE — Telephone Encounter (Signed)
*  STAT* If patient is at the pharmacy, call can be transferred to refill team.   1. Which medications need to be refilled? (please list name of each medication and dose if known) apixaban  (ELIQUIS ) 5 MG TABS tablet  Take 1 tablet (5 mg total) by mouth 2 (two) times daily.   2. Would you like to learn more about the convenience, safety, & potential cost savings by using the [facility]? No  3. Are you open to using the [facility] No   4. Which pharmacy/location (including street and city if local pharmacy) is medication to be sent to?[id number] - [place], [place] - [address]    5. Do they need a 30 day or 90 day supply? 30 Day Supply   Pt is currently out of medication.

## 2024-06-26 ENCOUNTER — Other Ambulatory Visit: Payer: Self-pay | Admitting: Hematology and Oncology

## 2024-06-26 ENCOUNTER — Inpatient Hospital Stay: Attending: Hematology and Oncology

## 2024-06-26 ENCOUNTER — Telehealth: Payer: Self-pay | Admitting: *Deleted

## 2024-06-26 DIAGNOSIS — R110 Nausea: Secondary | ICD-10-CM | POA: Diagnosis not present

## 2024-06-26 DIAGNOSIS — D696 Thrombocytopenia, unspecified: Secondary | ICD-10-CM | POA: Diagnosis not present

## 2024-06-26 DIAGNOSIS — Z7901 Long term (current) use of anticoagulants: Secondary | ICD-10-CM | POA: Diagnosis not present

## 2024-06-26 DIAGNOSIS — Z8572 Personal history of non-Hodgkin lymphomas: Secondary | ICD-10-CM | POA: Insufficient documentation

## 2024-06-26 DIAGNOSIS — I4892 Unspecified atrial flutter: Secondary | ICD-10-CM | POA: Diagnosis not present

## 2024-06-26 DIAGNOSIS — C8307 Small cell B-cell lymphoma, spleen: Secondary | ICD-10-CM

## 2024-06-26 LAB — CMP (CANCER CENTER ONLY)
ALT: 8 U/L (ref 0–44)
AST: 22 U/L (ref 15–41)
Albumin: 4.1 g/dL (ref 3.5–5.0)
Alkaline Phosphatase: 98 U/L (ref 38–126)
Anion gap: 6 (ref 5–15)
BUN: 23 mg/dL (ref 8–23)
CO2: 34 mmol/L — ABNORMAL HIGH (ref 22–32)
Calcium: 9.2 mg/dL (ref 8.9–10.3)
Chloride: 101 mmol/L (ref 98–111)
Creatinine: 1.12 mg/dL (ref 0.61–1.24)
GFR, Estimated: >60 mL/min (ref >60)
Glucose, Bld: 131 mg/dL — ABNORMAL HIGH (ref 70–99)
Potassium: 4.1 mmol/L (ref 3.5–5.1)
Sodium: 141 mmol/L (ref 135–145)
Total Bilirubin: 0.6 mg/dL (ref 0.0–1.2)
Total Protein: 6.9 g/dL (ref 6.5–8.1)

## 2024-06-26 LAB — CBC WITH DIFFERENTIAL (CANCER CENTER ONLY)
Abs Immature Granulocytes: 0 10*3/uL (ref 0.00–0.07)
Basophils Absolute: 0 10*3/uL (ref 0.0–0.1)
Basophils Relative: 0 %
Eosinophils Absolute: 0 10*3/uL (ref 0.0–0.5)
Eosinophils Relative: 0 %
HCT: 37 % — ABNORMAL LOW (ref 39.0–52.0)
Hemoglobin: 11.4 g/dL — ABNORMAL LOW (ref 13.0–17.0)
Lymphocytes Relative: 98 %
Lymphs Abs: 272.2 10*3/uL — ABNORMAL HIGH (ref 0.7–4.0)
MCH: 30.7 pg (ref 26.0–34.0)
MCHC: 30.8 g/dL (ref 30.0–36.0)
MCV: 99.7 fL (ref 80.0–100.0)
Monocytes Absolute: 0 10*3/uL — ABNORMAL LOW (ref 0.1–1.0)
Monocytes Relative: 0 %
Neutro Abs: 5.6 10*3/uL (ref 1.7–7.7)
Neutrophils Relative %: 2 %
Platelet Count: 110 10*3/uL — ABNORMAL LOW (ref 150–400)
RBC: 3.71 MIL/uL — ABNORMAL LOW (ref 4.22–5.81)
RDW: 14.7 % (ref 11.5–15.5)
Smear Review: NORMAL
WBC Count: 277.8 10*3/uL (ref 4.0–10.5)
WBC Morphology: ABNORMAL
nRBC: 0 % (ref 0.0–0.2)

## 2024-06-26 LAB — LACTATE DEHYDROGENASE: LDH: 174 U/L (ref 98–192)

## 2024-06-26 NOTE — Telephone Encounter (Signed)
 CRITICAL VALUE STICKER  CRITICAL VALUE:  WBC 277.8k  RECEIVER (on-site recipient of call): Landry Arabia, RN  DATE & TIME NOTIFIED: 06/26/24  11:36 am  MESSENGER (representative from lab): Eleanor  MD NOTIFIED: Federico  TIME OF NOTIFICATION: 11:38 am  RESPONSE:  acknowledged results.

## 2024-08-31 ENCOUNTER — Ambulatory Visit (INDEPENDENT_AMBULATORY_CARE_PROVIDER_SITE_OTHER): Admitting: Family Medicine

## 2024-08-31 ENCOUNTER — Encounter: Payer: Self-pay | Admitting: Family Medicine

## 2024-08-31 VITALS — BP 136/70 | HR 71 | Temp 98.4°F | Ht 69.0 in | Wt 140.0 lb

## 2024-08-31 DIAGNOSIS — L03119 Cellulitis of unspecified part of limb: Secondary | ICD-10-CM | POA: Insufficient documentation

## 2024-08-31 MED ORDER — DOXYCYCLINE HYCLATE 100 MG PO TABS: 100.0000 mg | ORAL_TABLET | Freq: Two times a day (BID) | ORAL | 0 refills | Status: DC

## 2024-08-31 MED ORDER — CEPHALEXIN 500 MG PO CAPS: 500.0000 mg | ORAL_CAPSULE | Freq: Three times a day (TID) | ORAL | 0 refills | Status: DC

## 2024-08-31 NOTE — Patient Instructions (Addendum)
 Keep the foot very clean with soap and water  Elevate it when you sit when you can  A gentle warm compress will help also - 10 minutes at a time  Take the generic keflex  and doxycycline  as directed  Follow up with G next week for a re check  If you notice worse Redness Swelling  Pain Let us  know asap  Go to ER if any severe symptoms

## 2024-08-31 NOTE — Progress Notes (Signed)
 Subjective:    Patient ID: Keith Jordan, male    DOB: [birth year], 83 y.o.   MRN: [id number]  HPI  Wt Readings from Last 3 Encounters:  08/31/24 140 lb (63.5 kg)  03/24/24 146 lb 8 oz (66.5 kg)  01/27/24 148 lb 6.4 oz (67.3 kg)   20.67 kg/m  Vitals:   08/31/24 1117  BP: 136/70  Pulse: 71  Temp: 98.4 F (36.9 C)  SpO2: 94%    83 yo pt of Dr KANDICE presents with possible foot infection  Left foot   PMHx of DM2 LE venous insufficiency A flutter  History of cellulitis (recurrent) on lower extremities   Left foot redness and swelling  Started about a week ago  No trauma  Foot is sore  Foot is a little swollen    No fever  Feeling ok   Lab Results  Component Value Date   HGBA1C 6.4 10/19/2023   HGBA1C 6.2 (A) 04/26/2023   HGBA1C 6.7 (H) 10/20/2022   Lab Results  Component Value Date   WBC 277.8 (HH) 06/26/2024   HGB 11.4 (L) 06/26/2024   HCT 37.0 (L) 06/26/2024   MCV 99.7 06/26/2024   PLT 110 (L) 06/26/2024    Patient Active Problem List   Diagnosis Date Noted   Cellulitis of foot 08/31/2024   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 11/09/2023   Fuchs' corneal dystrophy of both eyes 11/09/2023   Carotid atherosclerosis 11/08/2023   Pulsatile tinnitus 11/01/2023   Thigh hematoma, left, initial encounter 05/04/2023   Chronic anticoagulation 05/04/2023   Thrombocytopenia (HCC) 05/04/2023   First degree AV block 04/26/2023   Chronic venous insufficiency of lower extremity 02/12/2023   Cellulitis of left leg 02/05/2023   Allergic rhinitis 05/29/2022   Dizziness 05/29/2022   Right foot pain 05/26/2022   Cellulitis of left thigh 04/13/2022   Insomnia 11/17/2021   Nocturia 11/17/2021   Elevated blood pressure reading 11/17/2021   Typical atrial flutter (HCC) 10/13/2021   Skin sensation disturbance 10/13/2021   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   Abnormal breath sounds 10/11/2020   Sebaceous cyst 01/30/2020   Medicare annual  wellness visit, subsequent 10/04/2019   BPH (benign prostatic hyperplasia) 09/29/2018   Health maintenance examination 09/01/2016   Advanced care planning/counseling discussion 09/01/2016   Type 2 diabetes mellitus with other specified complication (HCC) 07/05/2016   Hyperlipidemia associated with type 2 diabetes mellitus (HCC)    Primary osteoarthritis of knee    Ischemic optic neuropathy    History of colonic polyps 07/30/2008   S/P TKR (total knee replacement), left 2005   Past Medical History:  Diagnosis Date   Abnormal breath sounds 10/11/2020   Acute sinusitis 01/29/2021   Advanced care planning/counseling discussion 09/01/2016   BPH (benign prostatic hyperplasia) 09/29/2018   Ear fullness, bilateral 04/03/2021   Health maintenance examination 09/01/2016   Hyperlipidemia    Irregular heart beat 03/24/2021   Ischemic optic neuropathy 2006   [facility] at [facility]   Leukocytosis 01/30/2021   Severe s/p ER eval 01/2021   Medicare annual wellness visit, subsequent 10/04/2019   Nausea without vomiting 03/24/2021   Personal history of colonic adenoma 07/30/2008   Prediabetes 07/05/2016   A1c 6.5% 03/2014    Prediabetes 07/05/2016   A1c 6.5% 03/2014    Primary osteoarthritis of knee    bilateral s/p L knee replacement, receives R knee injections libbie, [name])   Sebaceous cyst 01/30/2020   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  Past Surgical History:  Procedure Laterality Date   A-FLUTTER ABLATION N/A 07/11/2021   Procedure: A-FLUTTER ABLATION;  Surgeon: Inocencio Soyla Lunger, MD;  [facility];  Service: Cardiovascular;  Laterality: N/A;   COLONOSCOPY  12/2013   no polyps, no rpt due [place])   CYST EXCISION  01/2020   TOTAL KNEE ARTHROPLASTY Left 2005   [name]   Social History   Tobacco Use   Smoking status: Never   Smokeless tobacco: Never  Vaping Use   Vaping status: Never Used  Substance Use Topics   Alcohol use: Yes    Comment: occasional   Drug  use: No   Family History  Problem Relation Age of Onset   Blindness Mother 38       ?stroke in eyes   Lung disease Father        coal miner   Arthritis Father    Colon cancer Neg Hx    Pancreatic cancer Neg Hx    Stomach cancer Neg Hx    Esophageal cancer Neg Hx    Rectal cancer Neg Hx    Cancer Neg Hx    Diabetes Neg Hx    CAD Neg Hx    Stroke Neg Hx    No Known Allergies Current Outpatient Medications on File Prior to Visit  Medication Sig Dispense Refill   acetaminophen  (TYLENOL ) 500 MG tablet Take 500 mg by mouth every 6 (six) hours as needed for mild pain.     apixaban  (ELIQUIS ) 5 MG TABS tablet Take 1 tablet (5 mg total) by mouth 2 (two) times daily. 60 tablet 5   Ascorbic Acid (VITAMIN C) 1000 MG tablet Take 500 mg by mouth 2 (two) times daily.     atorvastatin  (LIPITOR) 10 MG tablet Take 1 tablet (10 mg total) by mouth daily. 90 tablet 4   carboxymethylcellulose (REFRESH PLUS) 0.5 % SOLN Place 1 drop into both eyes 2 (two) times daily as needed (dry eyes).     carvedilol  (COREG ) 6.25 MG tablet TAKE 1 TABLET BY MOUTH TWICE A DAY 180 tablet 2   L-Theanine 200 MG CAPS Take 1 Capful by mouth at bedtime as needed (sleep maintenance insomnia).     Multiple Vitamins-Minerals (MULTIVITAMIN WITH MINERALS) tablet Take 1 tablet by mouth daily. Unknown strenght     Multiple Vitamins-Minerals (PRESERVISION AREDS) CAPS Take 1 capsule by mouth in the morning and at bedtime. 180 capsule 3   Olopatadine  HCl 0.2 % SOLN Apply 1 drop to eye daily. (Both) 2.5 mL 0   sodium chloride  (OCEAN) 0.65 % SOLN nasal spray Place 1 spray into both nostrils as needed for congestion.     triamcinolone  (NASACORT ) 55 MCG/ACT AERO nasal inhaler Place 1 spray into the nose daily as needed (sinus congestion). 1 each    No current facility-administered medications on file prior to visit.    Review of Systems  Constitutional:  Negative for chills and fever.  HENT:         Recently dx with something on his  tongue  Has oral surgery appointment early October  Does not hurt or bother him  Skin:  Positive for color change.       Redness/ swelling top of left foot   Neurological:  Negative for syncope.       Objective:   Physical Exam Constitutional:      General: He is not in acute distress.    Appearance: Normal appearance. He is normal weight. He is not  ill-appearing.  HENT:     Head: Normocephalic and atraumatic.  Eyes:     General:        Right eye: No discharge.        Left eye: No discharge.     Conjunctiva/sclera: Conjunctivae normal.     Pupils: Pupils are equal, round, and reactive to light.  Cardiovascular:     Rate and Rhythm: Normal rate.     Pulses: Normal pulses.  Pulmonary:     Effort: Pulmonary effort is normal. No respiratory distress.  Skin:    Comments: 3 by 4 cm area of induration (firm), erythema, warmth on dorsal left foot  Mildly tender No fluctuance or drainage  No scab or open skin No streaking  Skin is taught/shiny   Neurological:     Mental Status: He is alert.     Sensory: No sensory deficit.  Psychiatric:        Mood and Affect: Mood normal.           Assessment & Plan:   Problem List Items Addressed This Visit       Other   Cellulitis of foot - Primary   Left foot  Pt has history of recurrent cellulitis in past - usually LE area , also venous insuff and DM 2 This may have started as a bug bite but no wound noted on exam today  Area is firm/no fluctuance or obvious abscess formation and no drainage  Strong pedal pulse noted thankfully   Prescription  Keflex   Doxycycline   Want to cover mrsa in light of recurrence  Has taken both before with success   Instructed to keep clean with soap and water Elevate when able Try warm compress prn to encourage blood flow   Follow up with pcp next week  Instructed to call if any worse pain/redness/swelling or size of involved area , or fever or other new symptoms  Call back and Er  precautions noted in detail today    Handout given

## 2024-08-31 NOTE — Assessment & Plan Note (Signed)
 Left foot  Pt has history of recurrent cellulitis in past - usually LE area , also venous insuff and DM 2 This may have started as a bug bite but no wound noted on exam today  Area is firm/no fluctuance or obvious abscess formation and no drainage  Strong pedal pulse noted thankfully   Prescription  Keflex   Doxycycline   Want to cover mrsa in light of recurrence  Has taken both before with success   Instructed to keep clean with soap and water Elevate when able Try warm compress prn to encourage blood flow   Follow up with pcp next week  Instructed to call if any worse pain/redness/swelling or size of involved area , or fever or other new symptoms  Call back and Er precautions noted in detail today    Handout given

## 2024-09-08 ENCOUNTER — Encounter: Payer: Self-pay | Admitting: Family Medicine

## 2024-09-08 ENCOUNTER — Ambulatory Visit: Admitting: Family Medicine

## 2024-09-08 VITALS — BP 120/58 | HR 60 | Temp 97.5°F | Ht 69.0 in | Wt 144.0 lb

## 2024-09-08 DIAGNOSIS — C8307 Small cell B-cell lymphoma, spleen: Secondary | ICD-10-CM | POA: Diagnosis not present

## 2024-09-08 DIAGNOSIS — L03116 Cellulitis of left lower limb: Secondary | ICD-10-CM

## 2024-09-08 DIAGNOSIS — I872 Venous insufficiency (chronic) (peripheral): Secondary | ICD-10-CM

## 2024-09-08 DIAGNOSIS — Z23 Encounter for immunization: Secondary | ICD-10-CM | POA: Diagnosis not present

## 2024-09-08 DIAGNOSIS — L03119 Cellulitis of unspecified part of limb: Secondary | ICD-10-CM

## 2024-09-08 NOTE — Progress Notes (Signed)
 Ph: [phone] Fax: [phone]   Patient ID: Keith Jordan, male    DOB: [birth year], 83 y.o.   MRN: [id number]  This visit was conducted in person.  BP (!) 120/58   Pulse 60   Temp (!) 97.5 F (36.4 C) (Oral)   Ht 5' 9 (1.753 m)   Wt 144 lb (65.3 kg)   SpO2 95%   BMI 21.27 kg/m    CC: recheck L foot cellulitis  Subjective:   HPI: Keith Jordan is a 83 y.o. male presenting on 09/08/2024 for Medical Management of Chronic Issues (Pt here for re-check of L foot.)   H/o recurrent cellulitis of lower extremities, h/o diabetes, venous insufficiency, and splenic marginal zone B-cell lymphoma.  Saw Dr Randeen last week with concern for L foot cellulitis - treated with keflex  500mg  tid x10d and doxycycline  100mg  bid. Cellulitis has improved with this regimen. Has 2 days left of antibiotic treatment.   Lab Results  Component Value Date   WBC 277.8 (HH) 06/26/2024   HGB 11.4 (L) 06/26/2024   HCT 37.0 (L) 06/26/2024   MCV 99.7 06/26/2024   PLT 110 (L) 06/26/2024        Relevant past medical, surgical, family and social history reviewed and updated as indicated. Interim medical history since our last visit reviewed. Allergies and medications reviewed and updated. Outpatient Medications Prior to Visit  Medication Sig Dispense Refill   acetaminophen  (TYLENOL ) 500 MG tablet Take 500 mg by mouth every 6 (six) hours as needed for mild pain.     apixaban  (ELIQUIS ) 5 MG TABS tablet Take 1 tablet (5 mg total) by mouth 2 (two) times daily. 60 tablet 5   Ascorbic Acid (VITAMIN C) 1000 MG tablet Take 500 mg by mouth 2 (two) times daily.     atorvastatin  (LIPITOR) 10 MG tablet Take 1 tablet (10 mg total) by mouth daily. 90 tablet 4   carboxymethylcellulose (REFRESH PLUS) 0.5 % SOLN Place 1 drop into both eyes 2 (two) times daily as needed (dry eyes).     carvedilol  (COREG ) 6.25 MG tablet TAKE 1 TABLET BY MOUTH TWICE A DAY 180 tablet 2   cephALEXin  (KEFLEX ) 500 MG capsule Take 1 capsule  (500 mg total) by mouth 3 (three) times daily. 30 capsule 0   doxycycline  (VIBRA -TABS) 100 MG tablet Take 1 tablet (100 mg total) by mouth 2 (two) times daily. 20 tablet 0   L-Theanine 200 MG CAPS Take 1 Capful by mouth at bedtime as needed (sleep maintenance insomnia).     Multiple Vitamins-Minerals (MULTIVITAMIN WITH MINERALS) tablet Take 1 tablet by mouth daily. Unknown strenght     Multiple Vitamins-Minerals (PRESERVISION AREDS) CAPS Take 1 capsule by mouth in the morning and at bedtime. 180 capsule 3   Olopatadine  HCl 0.2 % SOLN Apply 1 drop to eye daily. (Both) 2.5 mL 0   sodium chloride  (OCEAN) 0.65 % SOLN nasal spray Place 1 spray into both nostrils as needed for congestion.     triamcinolone  (NASACORT ) 55 MCG/ACT AERO nasal inhaler Place 1 spray into the nose daily as needed (sinus congestion). 1 each    No facility-administered medications prior to visit.     Per HPI unless specifically indicated in ROS section below Review of Systems  Objective:  BP (!) 120/58   Pulse 60   Temp (!) 97.5 F (36.4 C) (Oral)   Ht 5' 9 (1.753 m)   Wt 144 lb (65.3 kg)   SpO2 95%  BMI 21.27 kg/m   Wt Readings from Last 3 Encounters:  09/08/24 144 lb (65.3 kg)  08/31/24 140 lb (63.5 kg)  03/24/24 146 lb 8 oz (66.5 kg)      Physical Exam Vitals and nursing note reviewed.  Constitutional:      Appearance: Normal appearance. He is not ill-appearing.  Eyes:     Pupils: Pupils are equal, round, and reactive to light.  Musculoskeletal:        General: Tenderness present.     Right lower leg: No edema.     Left lower leg: Edema present.     Comments:  2+ DP bilaterally Chronic swelling to LLE, predominantly to dorsal foot, but overall improving compared to last visit (See picture)  Skin:    General: Skin is warm and dry.     Findings: Erythema present. No rash.     Comments:  Mildly tender erythema and edema to dorsal left foot  No fluctuance or drainage  Neurological:     Mental  Status: He is alert.  Psychiatric:        Mood and Affect: Mood normal.        Behavior: Behavior normal.          Results for orders placed or performed in visit on 06/26/24  Lactate dehydrogenase (LDH)   Collection Time: 06/26/24 11:19 AM  Result Value Ref Range   LDH 174 98 - 192 U/L  CMP (Cancer Center only)   Collection Time: 06/26/24 11:20 AM  Result Value Ref Range   Sodium 141 135 - 145 mmol/L   Potassium 4.1 3.5 - 5.1 mmol/L   Chloride 101 98 - 111 mmol/L   CO2 34 (H) 22 - 32 mmol/L   Glucose, Bld 131 (H) 70 - 99 mg/dL   BUN 23 8 - 23 mg/dL   Creatinine 8.87 9.38 - 1.24 mg/dL   Calcium  9.2 8.9 - 10.3 mg/dL   Total Protein 6.9 6.5 - 8.1 g/dL   Albumin 4.1 3.5 - 5.0 g/dL   AST 22 15 - 41 U/L   ALT 8 0 - 44 U/L   Alkaline Phosphatase 98 38 - 126 U/L   Total Bilirubin 0.6 0.0 - 1.2 mg/dL   GFR, Estimated >39 >39 mL/min   Anion gap 6 5 - 15  CBC with Differential (Cancer Center Only)   Collection Time: 06/26/24 11:20 AM  Result Value Ref Range   WBC Count 277.8 (HH) 4.0 - 10.5 K/uL   RBC 3.71 (L) 4.22 - 5.81 MIL/uL   Hemoglobin 11.4 (L) 13.0 - 17.0 g/dL   HCT 62.9 (L) 60.9 - 47.9 %   MCV 99.7 80.0 - 100.0 fL   MCH 30.7 26.0 - 34.0 pg   MCHC 30.8 30.0 - 36.0 g/dL   RDW 85.2 88.4 - 84.4 %   Platelet Count 110 (L) 150 - 400 K/uL   nRBC 0.0 0.0 - 0.2 %   Neutrophils Relative % 2 %   Neutro Abs 5.6 1.7 - 7.7 K/uL   Lymphocytes Relative 98 %   Lymphs Abs 272.2 (H) 0.7 - 4.0 K/uL   Monocytes Relative 0 %   Monocytes Absolute 0.0 (L) 0.1 - 1.0 K/uL   Eosinophils Relative 0 %   Eosinophils Absolute 0.0 0.0 - 0.5 K/uL   Basophils Relative 0 %   Basophils Absolute 0.0 0.0 - 0.1 K/uL   WBC Morphology Abnormal lymphocytes present    RBC Morphology MORPHOLOGY UNREMARKABLE    Smear Review  Normal platelet morphology    Abs Immature Granulocytes 0.00 0.00 - 0.07 K/uL    Assessment & Plan:   Problem List Items Addressed This Visit     Splenic marginal zone b-cell  lymphoma [facility])   Appreciate oncology care.       Chronic venous insufficiency of lower extremity   Cellulitis of foot - Primary   Left foot cellulitis treated with keflex  and doxycycline , has 2d left of treatment. Significant improvement on current regimen - rec finish course. Rec leg elevation.  Chronic recurrent issue. Consider [facility] eval if not improving as expected.       Other Visit Diagnoses       Encounter for immunization       Relevant Orders   Flu vaccine HIGH DOSE PF(Fluzone Trivalent) (Completed)        No orders of the defined types were placed in this encounter.   Orders Placed This Encounter  Procedures   Flu vaccine HIGH DOSE PF(Fluzone Trivalent)    Patient Instructions  Flu shot today  Cellulitis is doing much better Finish antibiotic, continue leg elevation, let us  know if new or worsening symptoms.   Follow up plan: Return if symptoms worsen or fail to improve.  Anton Blas, MD

## 2024-09-08 NOTE — Assessment & Plan Note (Signed)
 Appreciate oncology care.

## 2024-09-08 NOTE — Patient Instructions (Addendum)
 Flu shot today  Cellulitis is doing much better Finish antibiotic, continue leg elevation, let us  know if new or worsening symptoms.

## 2024-09-08 NOTE — Assessment & Plan Note (Signed)
 Left foot cellulitis treated with keflex  and doxycycline , has 2d left of treatment. Significant improvement on current regimen - rec finish course. Rec leg elevation.  Chronic recurrent issue. Consider [facility] eval if not improving as expected.

## 2024-09-14 ENCOUNTER — Ambulatory Visit: Payer: Self-pay

## 2024-09-14 ENCOUNTER — Ambulatory Visit: Admitting: Family Medicine

## 2024-09-14 ENCOUNTER — Encounter: Payer: Self-pay | Admitting: Family Medicine

## 2024-09-14 VITALS — BP 166/56 | HR 65 | Temp 98.4°F | Ht 69.0 in | Wt 144.4 lb

## 2024-09-14 DIAGNOSIS — H353132 Nonexudative age-related macular degeneration, bilateral, intermediate dry stage: Secondary | ICD-10-CM | POA: Diagnosis not present

## 2024-09-14 DIAGNOSIS — M25512 Pain in left shoulder: Secondary | ICD-10-CM

## 2024-09-14 DIAGNOSIS — H2513 Age-related nuclear cataract, bilateral: Secondary | ICD-10-CM | POA: Diagnosis not present

## 2024-09-14 NOTE — Assessment & Plan Note (Signed)
 Acute, possible side effect to recent flu shot, versus possible osteoarthritis flare versus pinched nerve.  Symptoms are now entirely resolved.  He has full range of motion in his shoulder normal musculoskeletal and neurologic exam.  No clear exertional component or sign of cardiac issue. Recommend Tylenol  extra strength 2 tablets 3 times a day as needed for pain.  Continue range of motion exercises and call if pain recurs.  No indication for imaging, no red flags.  Return and ER precautions provided.

## 2024-09-14 NOTE — Telephone Encounter (Signed)
 FYI Only or Action Required?: FYI only for provider.  Patient was last seen in primary care on 09/08/2024 by Rilla Baller, MD.  Called Nurse Triage reporting Shoulder Pain and side pain.  Symptoms began several days ago.  Interventions attempted: Ice/heat application.  Symptoms are: gradually improving.  Triage Disposition: See PCP When Office is Open (Within 3 Days)  Patient/caregiver understands and will follow disposition?: yes                    Copied from CRM #[id number]. Topic: Clinical - Red Word Triage >> Sep 14, 2024  8:05 AM Pinkey ORN wrote: Red Word that prompted transfer to Nurse Triage: Extreme Pain >> Sep 14, 2024  8:06 AM Pinkey ORN wrote: Patient states he's experiencing some pain in his right side and right shoulder.  Reason for Disposition  [1] MODERATE pain (e.g., interferes with normal activities) AND [2] present > 3 days  Answer Assessment - Initial Assessment Questions 1. ONSET: When did the pain start?     Tuesday 2. LOCATION: Where is the pain located?     Left shoulder and side at rib cage. 3. PAIN: How bad is the pain? (Scale 1-10; or mild, moderate, severe)     Yesterday 5/10 and today 2/10. Gets worse as day goes on. 4. WORK OR EXERCISE: Has there been any recent work or exercise that involved this part of the body?     no 5. CAUSE: What do you think is causing the shoulder pain?     ABX. Shoulder pain gets worse with movement. Side pain is constant. 6. OTHER SYMPTOMS: Do you have any other symptoms? (e.g., neck pain, swelling, rash, fever, numbness, weakness)     Could not sit yesterday.  Protocols used: Shoulder Pain-A-AH

## 2024-09-14 NOTE — Progress Notes (Signed)
 Patient ID: Keith Jordan, male    DOB: 1941-09-13, 83 y.o.   MRN: [id number]  This visit was conducted in person.  BP (!) 166/56   Pulse 65   Temp 98.4 F (36.9 C) (Oral)   Ht 5' 9 (1.753 m)   Wt 144 lb 6 oz (65.5 kg)   SpO2 94%   BMI 21.32 kg/m    CC:  Chief Complaint  Patient presents with   Shoulder Pain    C/o L shoulder and side pain. Started 09/12/24. Denies injury.     Subjective:   HPI: Keith Jordan is a 83 y.o. male presenting on 09/14/2024 for Shoulder Pain (C/o L shoulder and side pain. Started 09/12/24. Denies injury. )  3 days ago awoke with  lateral rib cage pain... no gone.  No N/V, no SOB, , no fever, no cough New onset left shoulder pain in last 2 days. Associated with pain in neck as well. No radiation of pain to arm.  No associated numbness or weakness.  Last night significant shoulder pain, kept pain Tylenol  this AM Now pain has completely resolved in shoulder, no symptoms full range of motion in shoulder.  Patient feels at baseline.SABRA   Hx of injury 20 years ago.     Recent doxy and keflex ...  foot cellulitis better.  Flu shot in left upper arm   Relevant past medical, surgical, family and social history reviewed and updated as indicated. Interim medical history since our last visit reviewed. Allergies and medications reviewed and updated. Outpatient Medications Prior to Visit  Medication Sig Dispense Refill   acetaminophen  (TYLENOL ) 500 MG tablet Take 500 mg by mouth every 6 (six) hours as needed for mild pain.     apixaban  (ELIQUIS ) 5 MG TABS tablet Take 1 tablet (5 mg total) by mouth 2 (two) times daily. 60 tablet 5   Ascorbic Acid (VITAMIN C) 1000 MG tablet Take 500 mg by mouth 2 (two) times daily.     atorvastatin  (LIPITOR) 10 MG tablet Take 1 tablet (10 mg total) by mouth daily. 90 tablet 4   carboxymethylcellulose (REFRESH PLUS) 0.5 % SOLN Place 1 drop into both eyes 2 (two) times daily as needed (dry eyes).     carvedilol  (COREG ) 6.25 MG  tablet TAKE 1 TABLET BY MOUTH TWICE A DAY 180 tablet 2   cephALEXin  (KEFLEX ) 500 MG capsule Take 1 capsule (500 mg total) by mouth 3 (three) times daily. 30 capsule 0   doxycycline  (VIBRA -TABS) 100 MG tablet Take 1 tablet (100 mg total) by mouth 2 (two) times daily. 20 tablet 0   L-Theanine 200 MG CAPS Take 1 Capful by mouth at bedtime as needed (sleep maintenance insomnia).     Multiple Vitamins-Minerals (MULTIVITAMIN WITH MINERALS) tablet Take 1 tablet by mouth daily. Unknown strenght     Multiple Vitamins-Minerals (PRESERVISION AREDS) CAPS Take 1 capsule by mouth in the morning and at bedtime. 180 capsule 3   Olopatadine  HCl 0.2 % SOLN Apply 1 drop to eye daily. (Both) 2.5 mL 0   sodium chloride  (OCEAN) 0.65 % SOLN nasal spray Place 1 spray into both nostrils as needed for congestion.     triamcinolone  (NASACORT ) 55 MCG/ACT AERO nasal inhaler Place 1 spray into the nose daily as needed (sinus congestion). 1 each    No facility-administered medications prior to visit.     Per HPI unless specifically indicated in ROS section below Review of Systems  Constitutional:  Negative for fatigue and  fever.  HENT:  Negative for ear pain.   Eyes:  Negative for pain.  Respiratory:  Negative for cough and shortness of breath.   Cardiovascular:  Negative for chest pain, palpitations and leg swelling.  Gastrointestinal:  Negative for abdominal pain.  Genitourinary:  Negative for dysuria.  Musculoskeletal:  Positive for arthralgias and neck pain. Negative for neck stiffness.  Neurological:  Negative for syncope, light-headedness and headaches.  Psychiatric/Behavioral:  Negative for dysphoric mood.    Objective:  BP (!) 166/56   Pulse 65   Temp 98.4 F (36.9 C) (Oral)   Ht 5' 9 (1.753 m)   Wt 144 lb 6 oz (65.5 kg)   SpO2 94%   BMI 21.32 kg/m   Wt Readings from Last 3 Encounters:  09/14/24 144 lb 6 oz (65.5 kg)  09/08/24 144 lb (65.3 kg)  08/31/24 140 lb (63.5 kg)      Physical Exam Vitals  reviewed.  Constitutional:      Appearance: He is well-developed.  HENT:     Head: Normocephalic.     Right Ear: Hearing normal.     Left Ear: Hearing normal.     Nose: Nose normal.  Neck:     Thyroid : No thyroid  mass or thyromegaly.     Vascular: No carotid bruit.     Trachea: Trachea normal.  Cardiovascular:     Rate and Rhythm: Normal rate and regular rhythm.     Pulses: Normal pulses.     Heart sounds: Heart sounds not distant. No murmur heard.    No friction rub. No gallop.     Comments: No peripheral edema Pulmonary:     Effort: Pulmonary effort is normal. No respiratory distress.     Breath sounds: Normal breath sounds.  Musculoskeletal:     Right shoulder: Normal. No swelling, deformity, effusion, tenderness or bony tenderness. Normal range of motion.     Left shoulder: Normal. No swelling, deformity, tenderness or bony tenderness. Normal range of motion.     Cervical back: Normal.     Thoracic back: Normal.     Lumbar back: Normal.     Comments: Negative Spurling bilaterally Negative empty can/Neer's, negative drop arm test  Skin:    General: Skin is warm and dry.     Findings: No rash.  Psychiatric:        Speech: Speech normal.        Behavior: Behavior normal.        Thought Content: Thought content normal.       Results for orders placed or performed in visit on 06/26/24  Lactate dehydrogenase (LDH)   Collection Time: 06/26/24 11:19 AM  Result Value Ref Range   LDH 174 98 - 192 U/L  CMP ([facility] Center only)   Collection Time: 06/26/24 11:20 AM  Result Value Ref Range   Sodium 141 135 - 145 mmol/L   Potassium 4.1 3.5 - 5.1 mmol/L   Chloride 101 98 - 111 mmol/L   CO2 34 (H) 22 - 32 mmol/L   Glucose, Bld 131 (H) 70 - 99 mg/dL   BUN 23 8 - 23 mg/dL   Creatinine 8.87 9.38 - 1.24 mg/dL   Calcium  9.2 8.9 - 10.3 mg/dL   Total Protein 6.9 6.5 - 8.1 g/dL   Albumin 4.1 3.5 - 5.0 g/dL   AST 22 15 - 41 U/L   ALT 8 0 - 44 U/L   Alkaline Phosphatase 98 38 -  126 U/L   Total  Bilirubin 0.6 0.0 - 1.2 mg/dL   GFR, Estimated >39 >39 mL/min   Anion gap 6 5 - 15  CBC with Differential (Can[facility] Center Only)   Collection Time: 06/26/24 11:20 AM  Result Value Ref Range   WBC Count 277.8 (HH) 4.0 - 10.5 K/uL   RBC 3.71 (L) 4.22 - 5.81 MIL/uL   Hemoglobin 11.4 (L) 13.0 - 17.0 g/dL   HCT 62.9 (L) 60.9 - 47.9 %   MCV 99.7 80.0 - 100.0 fL   MCH 30.7 26.0 - 34.0 pg   MCHC 30.8 30.0 - 36.0 g/dL   RDW 85.2 88.4 - 84.4 %   Platelet Count 110 (L) 150 - 400 K/uL   nRBC 0.0 0.0 - 0.2 %   Neutrophils Relative % 2 %   Neutro Abs 5.6 1.7 - 7.7 K/uL   Lymphocytes Relative 98 %   Lymphs Abs 272.2 (H) 0.7 - 4.0 K/uL   Monocytes Relative 0 %   Monocytes Absolute 0.0 (L) 0.1 - 1.0 K/uL   Eosinophils Relative 0 %   Eosinophils Absolute 0.0 0.0 - 0.5 K/uL   Basophils Relative 0 %   Basophils Absolute 0.0 0.0 - 0.1 K/uL   WBC Morphology Abnormal lymphocytes present    RBC Morphology MORPHOLOGY UNREMARKABLE    Smear Review Normal platelet morphology    Abs Immature Granulocytes 0.00 0.00 - 0.07 K/uL    Assessment and Plan  Acute pain of left shoulder Assessment & Plan: Acute, possible side effect to recent flu shot, versus possible osteoarthritis flare versus pinched nerve.  Symptoms are now entirely resolved.  He has full range of motion in his shoulder normal musculoskeletal and neurologic exam.  No clear exertional component or sign of cardiac issue. Recommend Tylenol  extra strength 2 tablets 3 times a day as needed for pain.  Continue range of motion exercises and call if pain recurs.  No indication for imaging, no red flags.  Return and ER precautions provided.     No follow-ups on file.   Greig Ring, MD

## 2024-09-27 ENCOUNTER — Ambulatory Visit: Admitting: Hematology and Oncology

## 2024-09-27 ENCOUNTER — Other Ambulatory Visit

## 2024-09-28 ENCOUNTER — Other Ambulatory Visit: Payer: Self-pay | Admitting: Hematology and Oncology

## 2024-09-28 DIAGNOSIS — C8307 Small cell B-cell lymphoma, spleen: Secondary | ICD-10-CM

## 2024-09-28 NOTE — Progress Notes (Unsigned)
 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 Referred to Emergency Department. 02/05/2021: establish care with Dr. Federico  02/24/2021: Week 1 of Monotherapy Rituximab  03/03/2021:  Week 2 of Monotherapy Rituximab  03/10/2021:  Week 3 of Monotherapy Rituximab . WBC 109.8 03/17/2021: Week 4 of Monotherapy Rituximab . WBC 94.8 03/24/2021: WBC 59.8 05/01/2021: WBC 89.1, Hgb 13.5, Plt 136 09/03/2021: WBC 97.8, Hgb 12.9, Plt 141 12/03/2021: WBC 137.8, Hgb 13.6, MCV 96.1, Plt 130 03/04/2022: WBC 131.1, Hgb 12.0, MCV 97.7, Plt 121 06/04/2022: WBC 144.9, Hgb 12.5, MCV 96.5, Plt 156 09/04/2022: WBC 182.3, Plt 12.3, MCV 96.9, Plt 126  Interval History:  Keith [facility] 83 y.o. male with medical history significant for splenic marginal zone lymphoma who presents for a follow up visit. The patient's last visit was on 03/24/2024. In the interim since the last visit he has been in stable health.   On exam today Mr. [facility] reports he has been well overall in the M since her last visit with no major changes in his health.  He reports he was given antibiotics about 1 month ago for cellulitis on his foot.  When he stopped the antibiotic therapy he began developing slight increased discomfort in the left side of his abdomen.  He reports that otherwise his appetite has been good but he does feel like he fills up quickly and tends to snack more.  He notes that he has not had any issues with fevers, chills, sweats, nausea, vomiting or diarrhea.  His energy is about a 6  out of 10.  He denies any bumps or lumps concerning for lymphadenopathy elsewhere.  He notes that his arthritis slows him down but otherwise he is able to do everything he wants to do in the day.  He has no issues with his ADLs.  He otherwise denies any new symptoms.  A full 10 point ROS is otherwise negative.  MEDICAL HISTORY:  Past Medical History:  Diagnosis Date   Abnormal breath sounds 10/11/2020   Acute sinusitis 01/29/2021   Advanced care planning/counseling discussion 09/01/2016   BPH (benign prostatic hyperplasia) 09/29/2018   Ear fullness, bilateral 04/03/2021   Health maintenance examination 09/01/2016   Hyperlipidemia    Irregular heart beat 03/24/2021   Ischemic optic neuropathy 2006   [place] [facility]   Leukocytosis 01/30/2021   Severe s/p ER eval 01/2021   Medicare annual wellness visit, subsequent 10/04/2019   Nausea without vomiting 03/24/2021   Personal history of colonic adenoma 07/30/2008   Prediabetes 07/05/2016   A1c 6.5% 03/2014    Prediabetes 07/05/2016   A1c 6.5% 03/2014    Primary osteoarthritis of knee    bilateral s/p L knee replacement, receives R knee injections libbie, [name])   Sebaceous cyst 01/30/2020   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    SURGICAL HISTORY: Past Surgical History:  Procedure Laterality Date   A-FLUTTER ABLATION N/A 07/11/2021   Procedure: A-FLUTTER ABLATION;  Surgeon: Inocencio Soyla Lunger, MD;  Location: [facility] INVASIVE CV [facility];  Service: Cardiovascular;  Laterality: N/A;   COLONOSCOPY  12/2013   no polyps, no rpt due [place])  CYST EXCISION  01/2020   TOTAL KNEE ARTHROPLASTY Left 2005   [name]    SOCIAL HISTORY: Social History   Socioeconomic History   Marital status: Married    Spouse name: Not on file   Number of children: Not on file   Years of education: Not on file   Highest education level: Not on file  Occupational History   Not on file  Tobacco Use   Smoking status: Never   Smokeless tobacco: Never  Vaping  Use   Vaping status: Never Used  Substance and Sexual Activity   Alcohol use: Yes    Comment: occasional   Drug use: No   Sexual activity: Not Currently  Other Topics Concern   Not on file  Social History Narrative   Lives with wife, 2 cats   Occ: retired Counsellor   Activity: works in yard   Diet: some water, fruits/vegetables daily   Social Drivers of Corporate investment banker Strain: Low Risk  (11/17/2023)   Overall Financial Resource Strain (CARDIA)    Difficulty of Paying Living Expenses: Not hard at all  Food Insecurity: No Food Insecurity (11/17/2023)   Hunger Vital Sign    Worried About Running Out of Food in the Last Year: Never true    Ran Out of Food in the Last Year: Never true  Transportation Needs: No Transportation Needs (11/17/2023)   PRAPARE - Administrator, Civil Service (Medical): No    Lack of Transportation (Non-Medical): No  Physical Activity: Inactive (11/17/2023)   Exercise Vital Sign    Days of Exercise per Week: 0 days    Minutes of Exercise per Session: 0 min  Stress: No Stress Concern Present (11/17/2023)   [employer] of Occupational Health - Occupational Stress Questionnaire    Feeling of Stress : Only a little  Social Connections: Moderately Isolated (11/17/2023)   Social Connection and Isolation Panel    Frequency of Communication with Friends and Family: Twice a week    Frequency of Social Gatherings with Friends and Family: Once a week    Attends Religious Services: Never    Database administrator or Organizations: No    Attends Banker Meetings: Never    Marital Status: Married  Catering manager Violence: Not At Risk (11/17/2023)   Humiliation, Afraid, Rape, and Kick questionnaire    Fear of Current or Ex-Partner: No    Emotionally Abused: No    Physically Abused: No    Sexually Abused: No    FAMILY HISTORY: Family History  Problem Relation Age of Onset   Blindness Mother 2       ?stroke in eyes    Lung disease Father        coal miner   Arthritis Father    Colon cancer Neg Hx    Pancreatic cancer Neg Hx    Stomach cancer Neg Hx    Esophageal cancer Neg Hx    Rectal cancer Neg Hx    Cancer Neg Hx    Diabetes Neg Hx    CAD Neg Hx    Stroke Neg Hx     ALLERGIES:  has no known allergies.  MEDICATIONS:  Current Outpatient Medications  Medication Sig Dispense Refill   acetaminophen  (TYLENOL ) 500 MG tablet Take 500 mg by mouth every 6 (six) hours as needed for mild pain.     apixaban  (ELIQUIS ) 5 MG TABS tablet Take 1 tablet (5 mg total) by mouth 2 (two) times  daily. 60 tablet 5   Ascorbic Acid (VITAMIN C) 1000 MG tablet Take 500 mg by mouth 2 (two) times daily.     atorvastatin  (LIPITOR) 10 MG tablet Take 1 tablet (10 mg total) by mouth daily. 90 tablet 4   carboxymethylcellulose (REFRESH PLUS) 0.5 % SOLN Place 1 drop into both eyes 2 (two) times daily as needed (dry eyes).     carvedilol  (COREG ) 6.25 MG tablet TAKE 1 TABLET BY MOUTH TWICE A DAY 180 tablet 2   cephALEXin  (KEFLEX ) 500 MG capsule Take 1 capsule (500 mg total) by mouth 3 (three) times daily. 30 capsule 0   doxycycline  (VIBRA -TABS) 100 MG tablet Take 1 tablet (100 mg total) by mouth 2 (two) times daily. 20 tablet 0   L-Theanine 200 MG CAPS Take 1 Capful by mouth at bedtime as needed (sleep maintenance insomnia).     Multiple Vitamins-Minerals (MULTIVITAMIN WITH MINERALS) tablet Take 1 tablet by mouth daily. Unknown strenght     Multiple Vitamins-Minerals (PRESERVISION AREDS) CAPS Take 1 capsule by mouth in the morning and at bedtime. 180 capsule 3   Olopatadine  HCl 0.2 % SOLN Apply 1 drop to eye daily. (Both) 2.5 mL 0   sodium chloride  (OCEAN) 0.65 % SOLN nasal spray Place 1 spray into both nostrils as needed for congestion.     triamcinolone  (NASACORT ) 55 MCG/ACT AERO nasal inhaler Place 1 spray into the nose daily as needed (sinus congestion). 1 each    No current facility-administered medications for this visit.     REVIEW OF SYSTEMS:   Constitutional: ( - ) fevers, ( - )  chills , ( - ) night sweats Eyes: ( - ) blurriness of vision, ( - ) double vision, ( - ) watery eyes Ears, nose, mouth, throat, and face: ( - ) mucositis, ( - ) sore throat Respiratory: ( - ) cough, ( - ) dyspnea, ( - ) wheezes Cardiovascular: ( - ) palpitation, ( - ) chest discomfort, ( - ) lower extremity swelling Gastrointestinal:  ( - ) nausea, ( - ) heartburn, ( - ) change in bowel habits Skin: ( - ) abnormal skin rashes Lymphatics: ( - ) new lymphadenopathy, ( - ) easy bruising Neurological: ( - ) numbness, ( -) tingling, ( - ) new weaknesses Behavioral/Psych: ( - ) mood change, ( - ) new changes  All other systems were reviewed with the patient and are negative.  PHYSICAL EXAMINATION: ECOG PERFORMANCE STATUS: 1 - Symptomatic but completely ambulatory  Vitals:   10/02/24 1350  BP: (!) 118/58  Pulse: 61  Resp: 13  Temp: (!) 97.2 F (36.2 C)  SpO2: 95%       Filed Weights   10/02/24 1350  Weight: 139 lb 11.2 oz (63.4 kg)       GENERAL: well appearing elderly Caucasian male alert, no distress and comfortable SKIN: skin color, texture, turgor are normal, no rashes or significant lesions EYES: conjunctiva are pink and non-injected, sclera clear LUNGS: clear to auscultation and percussion with normal breathing effort HEART: regular rate & rhythm and no murmurs and no lower extremity edema Musculoskeletal: no cyanosis of digits and no clubbing  PSYCH: alert & oriented x 3, fluent speech NEURO: no focal motor/sensory deficits  LABORATORY DATA:  I have reviewed the data as listed    Latest Ref Rng & Units 10/02/2024    1:16 PM 06/26/2024   11:20 AM 03/24/2024   10:57 AM  CBC  WBC 4.0 - 10.5 K/uL 237.3  277.8  228.9   Hemoglobin 13.0 - 17.0 g/dL 89.3  88.5  89.0   Hematocrit 39.0 - 52.0 % 34.6  37.0  35.8   Platelets 150 - 400 K/uL 119  110  95        Latest Ref Rng & Units 10/02/2024    1:16 PM  06/26/2024   11:20 AM 03/24/2024   10:57 AM  CMP  Glucose 70 - 99 mg/dL 841  868  831   BUN 8 - 23 mg/dL 25  23  23    Creatinine 0.61 - 1.24 mg/dL 8.82  8.87  8.97   Sodium 135 - 145 mmol/L 141  141  142   Potassium 3.5 - 5.1 mmol/L 4.3  4.1  4.2   Chloride 98 - 111 mmol/L 103  101  105   CO2 22 - 32 mmol/L 33  34  33   Calcium  8.9 - 10.3 mg/dL 9.4  9.2  9.0   Total Protein 6.5 - 8.1 g/dL 6.7  6.9  6.4   Total Bilirubin 0.0 - 1.2 mg/dL 0.6  0.6  0.6   Alkaline Phos 38 - 126 U/L 90  98  76   AST 15 - 41 U/L 23  22  25    ALT 0 - 44 U/L 7  8  9     RADIOGRAPHIC STUDIES: No results found.   ASSESSMENT & PLAN BURNIS HALLING 83 y.o. male with medical history significant for splenic marginal zone lymphoma. CT CAP from 02/13/21  showed only splenic involvement and a bone marrow biopsy from 02/21/21 confirmed the diagnosis.  The patient has only leukocytosis and splenomegaly.  The recommendation is monotherapy rituximab  375 mg per metered squared q. 7 days x 4 doses. He completed this on 03/17/2021.   At this time is marginal zone lymphoma appears like an indolent lymphocyte predominant variant.  These tend to behave more like CLL.  Given that I would recommend holding on treatment unless the patient were to have difficulties with splenomegaly, anemia, or worsening thrombocytopenia.  For treatment choices one could consider treatment with Ibrutinib/Zanbrutinib vs Bendamustine + ritux/obinutuzumab. Would need caution with ibrutinib given patient's cardiac history and current anticoagulation therapy.    Patient is doing well without any significant limitations. Labs from today show lymphocytosis and modest stable thrombocytopenia. Plan for patient to return to the clinic in 12 weeks for labs and 6 months for clinic visit.  #Splenic Marginal Zone Lymphoma, stable --findings are consistent with a splenic marginal zone lymphoma. --patient completed bone marrow biopsy and a CT chest abdomen pelvis. Disease  appear limited to peripheral blood and spleen.  --Treatment of choice with confirmed splenic marginal zone lymphoma is rituximab  375 mg/m q. 7 days x 4 doses. He completed this on 03/17/2021.  PLAN:  --Due to persistent lymphocytosis, recommend to continue with allopurinol  to prevent TLS. --if there is concern for worsening disease with an indication for treatment can consider treatment with Ibrutinib/Zanbrutinib vs Bendamustine + ritux/obinutuzumab. Would need caution with ibrutinib given patient's cardiac history and current anticoagulation therapy.  --labs today show WBC 237.3, Hgb 10.6, MCV 97.5, Plt 119  --RTC in 6 months with interval 3 month labs   #Thrombocytopenia-improved --Plt today stable at 119, fluctuating.  --likely 2/2 to splenomegaly.   #Nausea --Take Zofran  8 mg q 8 hours PRN.   #Atrial Flutter --patient follows with Cardiology  No orders of the defined types were placed in this encounter.  All questions were answered. The patient  knows to call the clinic with any problems, questions or concerns.  A total of more than 30 minutes were spent on this encounter and over half of that time was spent on counseling and coordination of care as outlined above.   Norleen IVAR Kidney, MD Department of Hematology/Oncology [facility] Phone: [phone] Pager: [phone] Email: [email or web address]

## 2024-10-02 ENCOUNTER — Ambulatory Visit: Admitting: Hematology and Oncology

## 2024-10-02 ENCOUNTER — Inpatient Hospital Stay: Attending: Hematology and Oncology

## 2024-10-02 VITALS — BP 118/58 | HR 61 | Temp 97.2°F | Resp 13 | Wt 139.7 lb

## 2024-10-02 DIAGNOSIS — D696 Thrombocytopenia, unspecified: Secondary | ICD-10-CM | POA: Insufficient documentation

## 2024-10-02 DIAGNOSIS — R110 Nausea: Secondary | ICD-10-CM | POA: Diagnosis not present

## 2024-10-02 DIAGNOSIS — D72820 Lymphocytosis (symptomatic): Secondary | ICD-10-CM

## 2024-10-02 DIAGNOSIS — C8307 Small cell B-cell lymphoma, spleen: Secondary | ICD-10-CM

## 2024-10-02 DIAGNOSIS — I4892 Unspecified atrial flutter: Secondary | ICD-10-CM | POA: Insufficient documentation

## 2024-10-02 DIAGNOSIS — Z7901 Long term (current) use of anticoagulants: Secondary | ICD-10-CM | POA: Diagnosis not present

## 2024-10-02 DIAGNOSIS — Z8572 Personal history of non-Hodgkin lymphomas: Secondary | ICD-10-CM | POA: Diagnosis not present

## 2024-10-02 LAB — CBC WITH DIFFERENTIAL (CANCER CENTER ONLY)
Abs Immature Granulocytes: 0.55 K/uL — ABNORMAL HIGH (ref 0.00–0.07)
Basophils Absolute: 0.1 K/uL (ref 0.0–0.1)
Basophils Relative: 0 %
Eosinophils Absolute: 0.9 K/uL — ABNORMAL HIGH (ref 0.0–0.5)
Eosinophils Relative: 0 %
HCT: 34.6 % — ABNORMAL LOW (ref 39.0–52.0)
Hemoglobin: 10.6 g/dL — ABNORMAL LOW (ref 13.0–17.0)
Immature Granulocytes: 0 %
Lymphocytes Relative: 96 %
Lymphs Abs: 224.2 K/uL — ABNORMAL HIGH (ref 0.7–4.0)
MCH: 29.9 pg (ref 26.0–34.0)
MCHC: 30.6 g/dL (ref 30.0–36.0)
MCV: 97.5 fL (ref 80.0–100.0)
Monocytes Absolute: 8 K/uL — ABNORMAL HIGH (ref 0.1–1.0)
Monocytes Relative: 3 %
Neutro Abs: 3.5 K/uL (ref 1.7–7.7)
Neutrophils Relative %: 1 %
Platelet Count: 119 K/uL — ABNORMAL LOW (ref 150–400)
RBC: 3.55 MIL/uL — ABNORMAL LOW (ref 4.22–5.81)
RDW: 15.6 % — ABNORMAL HIGH (ref 11.5–15.5)
Smear Review: NORMAL
WBC Count: 237.3 K/uL (ref 4.0–10.5)
nRBC: 0 % (ref 0.0–0.2)

## 2024-10-02 LAB — CMP (CANCER CENTER ONLY)
ALT: 7 U/L (ref 0–44)
AST: 23 U/L (ref 15–41)
Albumin: 3.9 g/dL (ref 3.5–5.0)
Alkaline Phosphatase: 90 U/L (ref 38–126)
Anion gap: 5 (ref 5–15)
BUN: 25 mg/dL — ABNORMAL HIGH (ref 8–23)
CO2: 33 mmol/L — ABNORMAL HIGH (ref 22–32)
Calcium: 9.4 mg/dL (ref 8.9–10.3)
Chloride: 103 mmol/L (ref 98–111)
Creatinine: 1.17 mg/dL (ref 0.61–1.24)
GFR, Estimated: >60 mL/min (ref >60)
Glucose, Bld: 158 mg/dL — ABNORMAL HIGH (ref 70–99)
Potassium: 4.3 mmol/L (ref 3.5–5.1)
Sodium: 141 mmol/L (ref 135–145)
Total Bilirubin: 0.6 mg/dL (ref 0.0–1.2)
Total Protein: 6.7 g/dL (ref 6.5–8.1)

## 2024-10-02 LAB — LACTATE DEHYDROGENASE: LDH: 159 U/L (ref 98–192)

## 2024-10-23 ENCOUNTER — Ambulatory Visit: Payer: Medicare HMO

## 2024-10-23 ENCOUNTER — Other Ambulatory Visit: Payer: Self-pay | Admitting: Family Medicine

## 2024-10-23 VITALS — BP 120/68 | Ht 69.0 in | Wt 144.0 lb

## 2024-10-23 DIAGNOSIS — Z0000 Encounter for general adult medical examination without abnormal findings: Secondary | ICD-10-CM

## 2024-10-23 DIAGNOSIS — C8307 Small cell B-cell lymphoma, spleen: Secondary | ICD-10-CM

## 2024-10-23 DIAGNOSIS — E1169 Type 2 diabetes mellitus with other specified complication: Secondary | ICD-10-CM

## 2024-10-23 NOTE — Progress Notes (Signed)
 Because this visit was a virtual/telehealth visit,  certain criteria was not obtained, such a blood pressure, CBG if applicable, and timed get up and go. Any medications not marked as taking were not mentioned during the medication reconciliation part of the visit. Any vitals not documented were not able to be obtained due to this being a telehealth visit or patient was unable to self-report a recent blood pressure reading due to a lack of equipment at home via telehealth. Vitals that have been documented are verbally provided by the patient.  This visit was performed by a medical professional under my direct supervision. I was immediately available for consultation/collaboration. I have reviewed and agree with the Annual Wellness Visit documentation.   Subjective:   Keith Jordan is a 83 y.o. who presents for a Medicare Wellness preventive visit.  As a reminder, Annual Wellness Visits don't include a physical exam, and some assessments may be limited, especially if this visit is performed virtually. We may recommend an in-person follow-up visit with your provider if needed.  Visit Complete: Virtual I connected with  Keith Jordan on 10/23/24 by a audio enabled telemedicine application and verified that I am speaking with the correct person using two identifiers.  Patient Location: Home  Provider Location: Home Office  I discussed the limitations of evaluation and management by telemedicine. The patient expressed understanding and agreed to proceed.  Vital Signs: Because this visit was a virtual/telehealth visit, some criteria may be missing or patient reported. Any vitals not documented were not able to be obtained and vitals that have been documented are patient reported.  VideoDeclined- This patient declined Librarian, academic. Therefore the visit was completed with audio only.  Persons Participating in Visit: Patient.  AWV Questionnaire: No: Patient  Medicare AWV questionnaire was not completed prior to this visit.  Cardiac Risk Factors include: advanced age (>45men, >80 women);male gender;Other (see comment);dyslipidemia, Risk factor comments: afib     Objective:    Today's Vitals   10/23/24 0942  BP: 120/68  Weight: 144 lb (65.3 kg)  Height: 5' 9 (1.753 m)   Body mass index is 21.27 kg/m.     10/23/2024    9:48 AM 11/17/2023    1:47 PM 05/09/2023    3:50 PM 10/20/2022    2:20 PM 07/11/2021   12:10 PM 03/24/2021   10:24 AM 03/17/2021   10:04 AM  Advanced Directives  Does Patient Have a Medical Advance Directive? Yes Yes No Yes Yes Yes Yes  Type of Estate Agent of [place];Living will Healthcare Power of [place];Living will  Healthcare Power of [place];Living will  Healthcare Power of [place] of [place];Living will  Does patient want to make changes to medical advance directive? No - Patient declined   No - Patient declined  No - Patient declined   Copy of Healthcare Power of Attorney in Chart? Yes - validated most recent copy scanned in chart (See row information) Yes - validated most recent copy scanned in chart (See row information)  Yes - validated most recent copy scanned in chart (See row information)  Yes - validated most recent copy scanned in chart (See row information) Yes - validated most recent copy scanned in chart (See row information)    Current Medications (verified) Outpatient Encounter Medications as of 10/23/2024  Medication Sig   acetaminophen  (TYLENOL ) 500 MG tablet Take 500 mg by mouth every 6 (six) hours as needed for mild pain.   apixaban  (ELIQUIS )  5 MG TABS tablet Take 1 tablet (5 mg total) by mouth 2 (two) times daily.   Ascorbic Acid (VITAMIN C) 1000 MG tablet Take 500 mg by mouth 2 (two) times daily.   atorvastatin  (LIPITOR) 10 MG tablet Take 1 tablet (10 mg total) by mouth daily.   carboxymethylcellulose (REFRESH PLUS) 0.5 % SOLN Place 1 drop into both  eyes 2 (two) times daily as needed (dry eyes).   carvedilol  (COREG ) 6.25 MG tablet TAKE 1 TABLET BY MOUTH TWICE A DAY   cephALEXin  (KEFLEX ) 500 MG capsule Take 1 capsule (500 mg total) by mouth 3 (three) times daily.   doxycycline  (VIBRA -TABS) 100 MG tablet Take 1 tablet (100 mg total) by mouth 2 (two) times daily.   L-Theanine 200 MG CAPS Take 1 Capful by mouth at bedtime as needed (sleep maintenance insomnia).   Multiple Vitamins-Minerals (MULTIVITAMIN WITH MINERALS) tablet Take 1 tablet by mouth daily. Unknown strenght   Multiple Vitamins-Minerals (PRESERVISION AREDS) CAPS Take 1 capsule by mouth in the morning and at bedtime.   Olopatadine  HCl 0.2 % SOLN Apply 1 drop to eye daily. (Both)   sodium chloride  (OCEAN) 0.65 % SOLN nasal spray Place 1 spray into both nostrils as needed for congestion.   triamcinolone  (NASACORT ) 55 MCG/ACT AERO nasal inhaler Place 1 spray into the nose daily as needed (sinus congestion).   No facility-administered encounter medications on file as of 10/23/2024.    Allergies (verified) Patient has no known allergies.   History: Past Medical History:  Diagnosis Date   Abnormal breath sounds 10/11/2020   Acute sinusitis 01/29/2021   Advanced care planning/counseling discussion 09/01/2016   BPH (benign prostatic hyperplasia) 09/29/2018   Ear fullness, bilateral 04/03/2021   Health maintenance examination 09/01/2016   Hyperlipidemia    Irregular heart beat 03/24/2021   Ischemic optic neuropathy 2006   [place] [facility]   Leukocytosis 01/30/2021   Severe s/p ER eval 01/2021   Medicare annual wellness visit, subsequent 10/04/2019   Nausea without vomiting 03/24/2021   Personal history of colonic adenoma 07/30/2008   Prediabetes 07/05/2016   A1c 6.5% 03/2014    Prediabetes 07/05/2016   A1c 6.5% 03/2014    Primary osteoarthritis of knee    bilateral s/p L knee replacement, receives R knee injections libbie, [name])   Sebaceous cyst 01/30/2020   Splenic marginal zone  b-cell lymphoma (HCC) 02/05/2021   Past Surgical History:  Procedure Laterality Date   A-FLUTTER ABLATION N/A 07/11/2021   Procedure: A-FLUTTER ABLATION;  Surgeon: Inocencio Soyla Lunger, MD;  Location: [facility] INVASIVE CV [facility];  Service: Cardiovascular;  Laterality: N/A;   COLONOSCOPY  12/2013   no polyps, no rpt due [place])   CYST EXCISION  01/2020   TOTAL KNEE ARTHROPLASTY Left 2005   [name]   Family History  Problem Relation Age of Onset   Blindness Mother 31       ?stroke in eyes   Lung disease Father        coal miner   Arthritis Father    Colon cancer Neg Hx    Pancreatic cancer Neg Hx    Stomach cancer Neg Hx    Esophageal cancer Neg Hx    Rectal cancer Neg Hx    Cancer Neg Hx    Diabetes Neg Hx    CAD Neg Hx    Stroke Neg Hx    Social History   Socioeconomic History   Marital status: Married    Spouse name: Not on file  Number of children: Not on file   Years of education: Not on file   Highest education level: Not on file  Occupational History   Not on file  Tobacco Use   Smoking status: Never   Smokeless tobacco: Never  Vaping Use   Vaping status: Never Used  Substance and Sexual Activity   Alcohol use: Yes    Comment: occasional   Drug use: No   Sexual activity: Not Currently  Other Topics Concern   Not on file  Social History Narrative   Lives with wife, 2 cats   Occ: retired counsellor   Activity: works in yard   Diet: some water, fruits/vegetables daily   Social Drivers of Corporate Investment Banker Strain: Low Risk  (10/23/2024)   Overall Financial Resource Strain (CARDIA)    Difficulty of Paying Living Expenses: Not hard at all  Food Insecurity: No Food Insecurity (10/23/2024)   Hunger Vital Sign    Worried About Running Out of Food in the Last Year: Never true    Ran Out of Food in the Last Year: Never true  Transportation Needs: No Transportation Needs (10/23/2024)   PRAPARE - Administrator, Civil Service (Medical): No    Lack  of Transportation (Non-Medical): No  Physical Activity: Insufficiently Active (10/23/2024)   Exercise Vital Sign    Days of Exercise per Week: 3 days    Minutes of Exercise per Session: 30 min  Stress: No Stress Concern Present (10/23/2024)   [employer] of Occupational Health - Occupational Stress Questionnaire    Feeling of Stress: Not at all  Social Connections: Moderately Isolated (10/23/2024)   Social Connection and Isolation Panel    Frequency of Communication with Friends and Family: Twice a week    Frequency of Social Gatherings with Friends and Family: Once a week    Attends Religious Services: Never    Database Administrator or Organizations: No    Attends Engineer, Structural: Never    Marital Status: Married    Tobacco Counseling Counseling given: Not Answered    Clinical Intake:  Pre-visit preparation completed: Yes  Pain : No/denies pain     BMI - recorded: 21.27 Nutritional Status: BMI of 19-24  Normal Nutritional Risks: None Diabetes: No  Lab Results  Component Value Date   HGBA1C 6.4 10/19/2023   HGBA1C 6.2 (A) 04/26/2023   HGBA1C 6.7 (H) 10/20/2022     How often do you need to have someone help you when you read instructions, pamphlets, or other written materials from your doctor or pharmacy?: 1 - Never  Interpreter Needed?: No  Information entered by :: Christinia Lambeth,CMA   Activities of Daily Living     10/23/2024    9:46 AM 11/17/2023    1:47 PM  In your present state of health, do you have any difficulty performing the following activities:  Hearing? 0 0  Vision? 0 0  Difficulty concentrating or making decisions? 0 0  Walking or climbing stairs? 0 0  Dressing or bathing? 0 0  Doing errands, shopping? 0 0  Preparing Food and eating ? N N  Using the Toilet? N N  In the past six months, have you accidently leaked urine? N N  Do you have problems with loss of bowel control? N N  Managing your Medications? N N   Managing your Finances? N N  Housekeeping or managing your Housekeeping? N N    Patient Care Team: Rilla Baller,  MD as PCP - General (Family Medicine) Tobb, [name], DO as PCP - Cardiology (Cardiology) Specialists, [facility] as Consulting Physician (Orthopedic Surgery) Fate Morna SAILOR, [facility] (Inactive) as Pharmacist (Pharmacist) Pa, [facility] (Optometry)  I have updated your Care Teams any recent Medical Services you may have received from other providers in the past year.     Assessment:   This is a routine wellness examination for Keith Jordan.  Hearing/Vision screen Hearing Screening - Comments:: Some trouble hearing and wears hearing aids Vision Screening - Comments:: Wears glasses   Goals Addressed             This Visit's Progress    Patient Stated       To get some hearing       Depression Screen     10/23/2024    9:49 AM 09/14/2024    3:51 PM 09/08/2024    1:45 PM 08/31/2024   11:29 AM 11/17/2023    1:43 PM 11/01/2023   11:16 AM 05/04/2023    2:03 PM  PHQ 2/9 Scores  PHQ - 2 Score 0 0 0 0 0 0 0  PHQ- 9 Score 1 1 1 1  1      Fall Risk     10/23/2024    9:48 AM 09/14/2024    3:51 PM 09/08/2024    1:45 PM 08/31/2024   11:29 AM 11/17/2023    1:39 PM  Fall Risk   Falls in the past year? 0 0 0 0 0  Number falls in past yr: 0 0 0 0 0  Injury with Fall? 0  0 0 0  Risk for fall due to : No Fall Risks No Fall Risks No Fall Risks No Fall Risks No Fall Risks  Follow up Falls evaluation completed  Falls evaluation completed Falls evaluation completed Education provided;Falls prevention discussed    MEDICARE RISK AT HOME:  Medicare Risk at Home Any stairs in or around the home?: No If so, are there any without handrails?: No Home free of loose throw rugs in walkways, pet beds, electrical cords, etc?: Yes Adequate lighting in your home to reduce risk of falls?: Yes Life alert?: No Use of a cane, walker or w/c?: No Grab bars in the  bathroom?: Yes Shower chair or bench in shower?: Yes Elevated toilet seat or a handicapped toilet?: Yes  TIMED UP AND GO:  Was the test performed?  No  Cognitive Function: 6CIT completed    10/04/2020    9:48 AM 09/26/2018    8:58 AM 09/14/2017    9:22 AM 09/11/2016    3:15 PM  MMSE - Mini Mental State Exam  Orientation to time 5 5 5  5    Orientation to Place 5 5 5  5    Registration 3 3 3  3    Attention/ Calculation 5 0 0  0   Recall 3 3 3  3    Language- name 2 objects  0 0  0   Language- repeat 1 1 1 1   Language- follow 3 step command  3 3  3    Language- read & follow direction  0 0  0   Write a sentence  0 0  0   Copy design  0 0  0   Total score  20 20  20       Data saved with a previous flowsheet row definition        10/23/2024    9:44 AM 11/17/2023  1:48 PM 10/20/2022    2:22 PM  6CIT Screen  What Year? 0 points 0 points 0 points  What month? 0 points 0 points 0 points  What time? 0 points 0 points 0 points  Count back from 20 0 points 0 points 0 points  Months in reverse 0 points 0 points 0 points  Repeat phrase 0 points 0 points 0 points  Total Score 0 points 0 points 0 points    Immunizations Immunization History  Administered Date(s) Administered   Fluad Quad(high Dose 65+) 10/04/2019, 10/11/2020, 10/13/2021, 10/26/2022   Fluad Trivalent(High Dose 65+) 11/01/2023   INFLUENZA, HIGH DOSE SEASONAL PF 09/08/2024   Influenza Split 10/30/2013   Influenza,inj,Quad PF,6+ Mos 09/14/2017, 09/26/2018   Influenza-Unspecified 09/01/2016   PFIZER(Purple Top)SARS-COV-2 Vaccination 02/03/2020, 02/24/2020, 12/13/2020   Pfizer Covid-19 Vaccine Bivalent Booster 85yrs & up 11/27/2021   Pfizer(Comirnaty)Fall Seasonal Vaccine 12 years and older 11/12/2022   Pneumococcal Conjugate-13 09/08/2016   Pneumococcal Polysaccharide-23 09/14/2017   Zoster Recombinant(Shingrix) 11/12/2022, 03/10/2023    Screening Tests Health Maintenance  Topic Date Due   Diabetic kidney  evaluation - Urine ACR  Never done   HEMOGLOBIN A1C  04/18/2024   FOOT EXAM  04/25/2024   DTaP/Tdap/Td (1 - Tdap) 09/15/2027 (Originally 03/17/1960)   COVID-19 Vaccine (6 - 2025-26 season) 09/17/2027 (Originally 08/28/2024)   OPHTHALMOLOGY EXAM  03/09/2025   Diabetic kidney evaluation - eGFR measurement  10/02/2025   Medicare Annual Wellness (AWV)  10/23/2025   Pneumococcal Vaccine: 50+ Years  Completed   Influenza Vaccine  Completed   Zoster Vaccines- Shingrix  Completed   Meningococcal B Vaccine  Aged Out   Hepatitis C Screening  Discontinued    Health Maintenance Items Addressed:patient has things scheduled   Additional Screening:  Vision Screening: Recommended annual ophthalmology exams for early detection of glaucoma and other disorders of the eye. Is the patient up to date with their annual eye exam?  Yes    Dental Screening: Recommended annual dental exams for proper oral hygiene  Community Resource Referral / Chronic Care Management: CRR required this visit?  No   CCM required this visit?  No   Plan:    I have personally reviewed and noted the following in the patient's chart:   Medical and social history Use of alcohol, tobacco or illicit drugs  Current medications and supplements including opioid prescriptions. Patient is not currently taking opioid prescriptions. Functional ability and status Nutritional status Physical activity Advanced directives List of other physicians Hospitalizations, surgeries, and ER visits in previous 12 months Vitals Screenings to include cognitive, depression, and falls Referrals and appointments  In addition, I have reviewed and discussed with patient certain preventive protocols, quality metrics, and best practice recommendations. A written personalized care plan for preventive services as well as general preventive health recommendations were provided to patient.   Keith Jordan Right, [place]   10/23/2024   After Visit Summary:  (MyChart) Due to this being a telephonic visit, the after visit summary with patients personalized plan was offered to patient via MyChart   Notes: Nothing significant to report at this time.

## 2024-10-23 NOTE — Patient Instructions (Signed)
 Mr. Douse,  Thank you for taking the time for your Medicare Wellness Visit. I appreciate your continued commitment to your health goals. Please review the care plan we discussed, and feel free to reach out if I can assist you further.  Medicare recommends these wellness visits once per year to help you and your care team stay ahead of potential health issues. These visits are designed to focus on prevention, allowing your provider to concentrate on managing your acute and chronic conditions during your regular appointments.  Please note that Annual Wellness Visits do not include a physical exam. Some assessments may be limited, especially if the visit was conducted virtually. If needed, we may recommend a separate in-person follow-up with your provider.  Ongoing Care Seeing your primary care provider every 3 to 6 months helps us  monitor your health and provide consistent, personalized care.   Referrals If a referral was made during today's visit and you haven't received any updates within two weeks, please contact the referred provider directly to check on the status.  Recommended Screenings:  Health Maintenance  Topic Date Due   Yearly kidney health urinalysis for diabetes  Never done   Hemoglobin A1C  04/18/2024   Complete foot exam   04/25/2024   DTaP/Tdap/Td vaccine (1 - Tdap) 09/15/2027*   COVID-19 Vaccine (6 - 2025-26 season) 09/17/2027*   Eye exam for diabetics  03/09/2025   Yearly kidney function blood test for diabetes  10/02/2025   Medicare Annual Wellness Visit  10/23/2025   Pneumococcal Vaccine for age over 72  Completed   Flu Shot  Completed   Zoster (Shingles) Vaccine  Completed   Meningitis B Vaccine  Aged Out   Hepatitis C Screening  Discontinued  *Topic was postponed. The date shown is not the original due date.       10/23/2024    9:48 AM  Advanced Directives  Does Patient Have a Medical Advance Directive? Yes  Type of Estate Agent of  [place];Living will  Does patient want to make changes to medical advance directive? No - Patient declined  Copy of Healthcare Power of Attorney in Chart? Yes - validated most recent copy scanned in chart (See row information)   Advance Care Planning is important because it: Ensures you receive medical care that aligns with your values, goals, and preferences. Provides guidance to your family and loved ones, reducing the emotional burden of decision-making during critical moments.  Vision: Annual vision screenings are recommended for early detection of glaucoma, cataracts, and diabetic retinopathy. These exams can also reveal signs of chronic conditions such as diabetes and high blood pressure.  Dental: Annual dental screenings help detect early signs of oral cancer, gum disease, and other conditions linked to overall health, including heart disease and diabetes.  Please see the attached documents for additional preventive care recommendations.

## 2024-10-23 NOTE — Addendum Note (Signed)
 Addended by: RILLA BALLER on: 10/23/2024 07:29 AM   Modules accepted: Orders

## 2024-10-24 ENCOUNTER — Ambulatory Visit (HOSPITAL_COMMUNITY)
Admission: RE | Admit: 2024-10-24 | Discharge: 2024-10-24 | Disposition: A | Discharge status: Home / Self Care | Source: Ambulatory Visit | Attending: Vascular Surgery | Admitting: Vascular Surgery

## 2024-10-24 ENCOUNTER — Ambulatory Visit (INDEPENDENT_AMBULATORY_CARE_PROVIDER_SITE_OTHER): Admitting: Family

## 2024-10-24 ENCOUNTER — Ambulatory Visit: Payer: Self-pay

## 2024-10-24 VITALS — BP 120/60 | HR 57 | Temp 98.2°F | Ht 69.0 in | Wt 140.0 lb

## 2024-10-24 DIAGNOSIS — R600 Localized edema: Secondary | ICD-10-CM

## 2024-10-24 DIAGNOSIS — M79601 Pain in right arm: Secondary | ICD-10-CM

## 2024-10-24 DIAGNOSIS — A46 Erysipelas: Secondary | ICD-10-CM

## 2024-10-24 MED ORDER — TRAMADOL HCL 25 MG PO TABS: 25.0000 mg | ORAL_TABLET | Freq: Four times a day (QID) | ORAL | 0 refills | Status: DC | PRN

## 2024-10-24 MED ORDER — CEPHALEXIN 500 MG PO CAPS: 500.0000 mg | ORAL_CAPSULE | Freq: Three times a day (TID) | ORAL | 0 refills | Status: DC

## 2024-10-24 MED ORDER — DOXYCYCLINE HYCLATE 100 MG PO TABS: 100.0000 mg | ORAL_TABLET | Freq: Two times a day (BID) | ORAL | 0 refills | Status: DC

## 2024-10-24 NOTE — Progress Notes (Signed)
 Established Patient Office Visit  Subjective:      CC:  Chief Complaint  Patient presents with   Arm Swelling    Right arm x 4 days. Started in hand and moved up arm. Very tender     HPI: Keith Jordan is a 83 y.o. male presenting on 10/24/2024 for Arm Swelling (Right arm x 4 days. Started in hand and moved up arm. Very tender ) .  Discussed the use of AI scribe software for clinical note transcription with the patient, who gave verbal consent to proceed.  History of Present Illness Keith Jordan is an 83 year old male with B cell lymphoma who presents with significant swelling and pain in the right arm.  The swelling in his right arm began on Friday or Saturday and has significantly increased in size over the weekend. The patient reports swelling, redness, warmth, and multicolored appearance of the right arm, with bruising on the right hand. No recent injury or insect bite to the area, although he recalls a previous insect bite for which he received antibiotics. Ice packs provided little relief.  He experienced a fever on Sunday, which he did not measure but felt chills when getting out of bed. He has significant pain in the arm, exacerbated by touch, and did not sleep at all the previous night due to discomfort. No shortness of breath or chest pain, but he mentions occasional nausea.  He has a history of B cell lymphoma, currently in remission, although his white blood cell count remains above normal but stable for the past five years. He is on Eliquis  for anticoagulation. He previously received Keflex  and doxycycline  for a suspected MRSA infection in his foot.  His leg has remained swollen since a previous bug bite, although it is not currently painful. No calf pain and he does not see a vascular doctor. Tylenol  helps with the pain but does not completely alleviate it.         Social history:  Relevant past medical, surgical, family and social history reviewed and  updated as indicated. Interim medical history since our last visit reviewed.  Allergies and medications reviewed and updated.  DATA REVIEWED: CHART IN EPIC     ROS: Negative unless specifically indicated above in HPI.    Current Outpatient Medications:    acetaminophen  (TYLENOL ) 500 MG tablet, Take 500 mg by mouth every 6 (six) hours as needed for mild pain., Disp: , Rfl:    apixaban  (ELIQUIS ) 5 MG TABS tablet, Take 1 tablet (5 mg total) by mouth 2 (two) times daily., Disp: 60 tablet, Rfl: 5   Ascorbic Acid (VITAMIN C) 1000 MG tablet, Take 500 mg by mouth 2 (two) times daily., Disp: , Rfl:    atorvastatin  (LIPITOR) 10 MG tablet, Take 1 tablet (10 mg total) by mouth daily., Disp: 90 tablet, Rfl: 4   carboxymethylcellulose (REFRESH PLUS) 0.5 % SOLN, Place 1 drop into both eyes 2 (two) times daily as needed (dry eyes)., Disp: , Rfl:    carvedilol  (COREG ) 6.25 MG tablet, TAKE 1 TABLET BY MOUTH TWICE A DAY, Disp: 180 tablet, Rfl: 2   cephALEXin  (KEFLEX ) 500 MG capsule, Take 1 capsule (500 mg total) by mouth 3 (three) times daily for 10 days., Disp: 30 capsule, Rfl: 0   doxycycline  (VIBRA -TABS) 100 MG tablet, Take 1 tablet (100 mg total) by mouth 2 (two) times daily for 10 days., Disp: 20 tablet, Rfl: 0   L-Theanine 200 MG CAPS, Take 1 Capful  by mouth at bedtime as needed (sleep maintenance insomnia)., Disp: , Rfl:    Multiple Vitamins-Minerals (MULTIVITAMIN WITH MINERALS) tablet, Take 1 tablet by mouth daily. Unknown strenght, Disp: , Rfl:    Multiple Vitamins-Minerals (PRESERVISION AREDS) CAPS, Take 1 capsule by mouth in the morning and at bedtime., Disp: 180 capsule, Rfl: 3   Olopatadine  HCl 0.2 % SOLN, Apply 1 drop to eye daily. (Both), Disp: 2.5 mL, Rfl: 0   traMADol HCl 25 MG TABS, Take 25 mg by mouth every 6 (six) hours as needed., Disp: 15 tablet, Rfl: 0   sodium chloride  (OCEAN) 0.65 % SOLN nasal spray, Place 1 spray into both nostrils as needed for congestion., Disp: , Rfl:     triamcinolone  (NASACORT ) 55 MCG/ACT AERO nasal inhaler, Place 1 spray into the nose daily as needed (sinus congestion)., Disp: 1 each, Rfl:         Objective:            BP 120/60   Pulse (!) 57   Temp 98.2 F (36.8 C) (Temporal)   Ht 5' 9 (1.753 m)   Wt 140 lb (63.5 kg)   SpO2 94%   BMI 20.67 kg/m   Physical Exam   Wt Readings from Last 3 Encounters:  10/24/24 140 lb (63.5 kg)  10/23/24 144 lb (65.3 kg)  10/02/24 139 lb 11.2 oz (63.4 kg)    Physical Exam Vitals reviewed.  Cardiovascular:     Rate and Rhythm: Normal rate and regular rhythm.  Pulmonary:     Effort: Pulmonary effort is normal.     Breath sounds: Normal breath sounds.  Musculoskeletal:     Left lower leg: 2+ Edema present.  Skin:    Comments: Edema erythema and warmth to site right upper extremity extending from base of elbow to hand. Worse at base of elbow medially and bruising right hand. Unable to fully palpate pulses UE due to edema 2+ pitting edema base of forearm            Results   Assessment & Plan:   Assessment and Plan Assessment & Plan Cellulitis of right lower arm Acute cellulitis of the right lower arm with significant swelling, redness, warmth, and pain. Symptoms have progressed over the weekend, with peak swelling reached. No recent insect bites or stings reported. Differential includes infection and possible deep vein thrombosis (DVT). - Ordered stat ultrasound of the right arm to rule out DVT - Prescribed antibiotics to treat cellulitis -RX doxycycline  100 mg po bid x 10 days -RX cephalexin  500 mg tid x 10 days - Advised emergency room visit if sudden chest pain, shortness of breath, or increased redness occurs  Rule out deep vein thrombosis of right upper extremity Concern for possible DVT in the right upper extremity due to significant swelling and pain. He is on Eliquis , which is beneficial for anticoagulation. - Ordered stat ultrasound of the right arm to rule  out DVT  Chronic swelling of left lower extremity Chronic swelling of the left lower extremity, persistent since a previous insect bite. No current pain or vascular issues reported. No vascular specialist currently involved in care.       Return in about 3 days (around 10/27/2024) for f/u with me as Dr. KANDICE is out.     Ginger Patrick, MSN, APRN, FNP-C [facility]

## 2024-10-24 NOTE — Telephone Encounter (Signed)
Noted will see pt as scheduled

## 2024-10-24 NOTE — Telephone Encounter (Signed)
 FYI Only or Action Required?: FYI only for provider.  Patient was last seen in primary care on 09/14/2024 by Keith Greig BRAVO, MD.  Called Nurse Triage reporting Arm Swelling. - lower arm - warm and red  Symptoms began a week ago.  Interventions attempted: Nothing.  Symptoms are: gradually worsening.  Triage Disposition: See HCP Within 4 Hours (Or PCP Triage)  Patient/caregiver understands and will follow disposition?: Yes                     Copied from CRM [id number]. Topic: Clinical - Red Word Triage >> Oct 24, 2024  7:38 AM Pinkey ORN wrote: Red Word that prompted transfer to Nurse Triage: Swelling In Right Arm >> Oct 24, 2024  7:39 AM Pinkey ORN wrote: Patient mentions having an infection in his right arm, also mentions that it's swelled up twice as big as it should be.  Reason for Disposition  [1] Red area or streak [2] large (> 2 inches or 5 cm)  Answer Assessment - Initial Assessment Questions 1. ONSET: When did the swelling start? (e.g., minutes, hours, days)     Swelling a few days - soreness about a week 2. LOCATION: What part of the arm is swollen?  Are both arms swollen or just one arm?     Right arm - lower arm 3. SEVERITY: How bad is the swelling? (e.g., localized; mild, moderate, severe)     More than 2x the normal size 4. REDNESS: Is there redness or signs of infection?     Red and warm - multi colored 5. PAIN: Is the swelling painful to touch? If Yes, ask: How painful is it?   (Scale 1-10; mild, moderate or severe)     Very painful 6. FEVER: Do you have a fever? If Yes, ask: What is it, how was it measured, and when did it start?      Sunday night had a fever 7. CAUSE: What do you think is causing the arm swelling?     infection  9. RECURRENT SYMPTOM: Have you had arm swelling before? If Yes, ask: When was the last time? What happened that time?     Leg has - prone to infections 10. OTHER SYMPTOMS: Do you have  any other symptoms? (e.g., chest pain, difficulty breathing)       no  Protocols used: Arm Swelling and Edema-A-AH

## 2024-10-24 NOTE — Telephone Encounter (Addendum)
 Agree needs eval today.  Appreciate Tabitha seeing this patient today.   H/o B cell lymphoma Lab Results  Component Value Date   WBC 237.3 (HH) 10/02/2024   HGB 10.6 (L) 10/02/2024   HCT 34.6 (L) 10/02/2024   MCV 97.5 10/02/2024   PLT 119 (L) 10/02/2024

## 2024-10-25 ENCOUNTER — Ambulatory Visit: Payer: Self-pay | Admitting: Family

## 2024-10-25 ENCOUNTER — Other Ambulatory Visit: Payer: Medicare HMO

## 2024-10-25 DIAGNOSIS — E785 Hyperlipidemia, unspecified: Secondary | ICD-10-CM

## 2024-10-25 DIAGNOSIS — E1169 Type 2 diabetes mellitus with other specified complication: Secondary | ICD-10-CM

## 2024-10-25 LAB — LIPID PANEL
Cholesterol: 92 mg/dL (ref 0–200)
HDL: 28 mg/dL — ABNORMAL LOW (ref >39.00)
LDL Cholesterol: 41 mg/dL (ref 0–99)
NonHDL: 64.46
Total CHOL/HDL Ratio: 3
Triglycerides: 117 mg/dL (ref 0.0–149.0)
VLDL: 23.4 mg/dL (ref 0.0–40.0)

## 2024-10-25 LAB — COMPREHENSIVE METABOLIC PANEL WITH GFR
ALT: 7 U/L (ref 0–53)
AST: 24 U/L (ref 0–37)
Albumin: 4 g/dL (ref 3.5–5.2)
Alkaline Phosphatase: 80 U/L (ref 39–117)
BUN: 29 mg/dL — ABNORMAL HIGH (ref 6–23)
CO2: 30 meq/L (ref 19–32)
Calcium: 9.1 mg/dL (ref 8.4–10.5)
Chloride: 101 meq/L (ref 96–112)
Creatinine, Ser: 0.97 mg/dL (ref 0.40–1.50)
GFR: 72.65 mL/min (ref >60.00)
Glucose, Bld: 148 mg/dL — ABNORMAL HIGH (ref 70–99)
Potassium: 4 meq/L (ref 3.5–5.1)
Sodium: 140 meq/L (ref 135–145)
Total Bilirubin: 0.8 mg/dL (ref 0.2–1.2)
Total Protein: 6.7 g/dL (ref 6.0–8.3)

## 2024-10-25 LAB — MICROALBUMIN / CREATININE URINE RATIO
Creatinine,U: 173.8 mg/dL
Microalb Creat Ratio: 567.8 mg/g — ABNORMAL HIGH (ref 0.0–30.0)
Microalb, Ur: 98.7 mg/dL — ABNORMAL HIGH (ref 0.0–1.9)

## 2024-10-25 LAB — VITAMIN B12: Vitamin B-12: 570 pg/mL (ref 211–911)

## 2024-10-25 LAB — HEMOGLOBIN A1C: Hgb A1c MFr Bld: 6.7 % — ABNORMAL HIGH (ref 4.6–6.5)

## 2024-10-25 NOTE — Progress Notes (Signed)
 Please call on call radiology and question radiology the below question:  Can we call radiology Quit possible for neoplasm as with lymphoma, do you have suggestion for which study to differentiate?

## 2024-10-25 NOTE — Telephone Encounter (Unsigned)
 Copied from CRM [id number]. Topic: General - Other >> Oct 25, 2024  9:30 AM Mercedes MATSU wrote: Reason for CRM: Patient called back stating that he had a missed call. After checking his chart, I saw that there was a note stating the patient is at risk and if he calls to be directly transferred to the office to speak with CMA Manuelita Frost. I contacted CAL and was told to send a crm and that she would call the patient back. The patient had disconnected from the other line as well. Patient can be reached back at [phone].

## 2024-10-25 NOTE — Telephone Encounter (Signed)
 Copied from CRM [id number]. Topic: General - Call Back - No Documentation >> Oct 25, 2024  9:35 AM Eva FALCON wrote: Reason for CRM: Pt was returning call to [place]. Called over CAL and they advise Manuelita would call back. Pt asked to call land line number [phone] as he can hear better on that phone.

## 2024-10-26 ENCOUNTER — Emergency Department (HOSPITAL_BASED_OUTPATIENT_CLINIC_OR_DEPARTMENT_OTHER)

## 2024-10-26 ENCOUNTER — Other Ambulatory Visit: Payer: Self-pay

## 2024-10-26 ENCOUNTER — Observation Stay (HOSPITAL_BASED_OUTPATIENT_CLINIC_OR_DEPARTMENT_OTHER)
Admission: EM | Admit: 2024-10-26 | Discharge: 2024-10-28 | Disposition: A | Discharge status: Home / Self Care | Source: Ambulatory Visit | Attending: Internal Medicine | Admitting: Internal Medicine

## 2024-10-26 DIAGNOSIS — M19031 Primary osteoarthritis, right wrist: Secondary | ICD-10-CM | POA: Diagnosis not present

## 2024-10-26 DIAGNOSIS — Z0389 Encounter for observation for other suspected diseases and conditions ruled out: Secondary | ICD-10-CM | POA: Diagnosis not present

## 2024-10-26 DIAGNOSIS — Z743 Need for continuous supervision: Secondary | ICD-10-CM | POA: Diagnosis not present

## 2024-10-26 DIAGNOSIS — I480 Paroxysmal atrial fibrillation: Secondary | ICD-10-CM | POA: Diagnosis not present

## 2024-10-26 DIAGNOSIS — S40021D Contusion of right upper arm, subsequent encounter: Secondary | ICD-10-CM | POA: Diagnosis present

## 2024-10-26 DIAGNOSIS — R531 Weakness: Secondary | ICD-10-CM | POA: Diagnosis not present

## 2024-10-26 DIAGNOSIS — S40021A Contusion of right upper arm, initial encounter: Secondary | ICD-10-CM | POA: Diagnosis not present

## 2024-10-26 DIAGNOSIS — Z8572 Personal history of non-Hodgkin lymphomas: Secondary | ICD-10-CM | POA: Insufficient documentation

## 2024-10-26 DIAGNOSIS — I5032 Chronic diastolic (congestive) heart failure: Secondary | ICD-10-CM | POA: Diagnosis not present

## 2024-10-26 DIAGNOSIS — T148XXA Other injury of unspecified body region, initial encounter: Principal | ICD-10-CM

## 2024-10-26 DIAGNOSIS — Z79899 Other long term (current) drug therapy: Secondary | ICD-10-CM | POA: Diagnosis not present

## 2024-10-26 DIAGNOSIS — Z96652 Presence of left artificial knee joint: Secondary | ICD-10-CM | POA: Insufficient documentation

## 2024-10-26 DIAGNOSIS — D72829 Elevated white blood cell count, unspecified: Secondary | ICD-10-CM | POA: Diagnosis not present

## 2024-10-26 DIAGNOSIS — I10 Essential (primary) hypertension: Secondary | ICD-10-CM | POA: Diagnosis not present

## 2024-10-26 DIAGNOSIS — E785 Hyperlipidemia, unspecified: Secondary | ICD-10-CM | POA: Insufficient documentation

## 2024-10-26 DIAGNOSIS — Z7901 Long term (current) use of anticoagulants: Secondary | ICD-10-CM | POA: Insufficient documentation

## 2024-10-26 DIAGNOSIS — D696 Thrombocytopenia, unspecified: Secondary | ICD-10-CM | POA: Insufficient documentation

## 2024-10-26 DIAGNOSIS — M7981 Nontraumatic hematoma of soft tissue: Secondary | ICD-10-CM | POA: Diagnosis not present

## 2024-10-26 DIAGNOSIS — I110 Hypertensive heart disease with heart failure: Secondary | ICD-10-CM | POA: Diagnosis not present

## 2024-10-26 DIAGNOSIS — R2231 Localized swelling, mass and lump, right upper limb: Secondary | ICD-10-CM | POA: Diagnosis present

## 2024-10-26 DIAGNOSIS — L0390 Cellulitis, unspecified: Secondary | ICD-10-CM | POA: Diagnosis not present

## 2024-10-26 LAB — COMPREHENSIVE METABOLIC PANEL WITH GFR
ALT: 13 U/L (ref 0–44)
AST: 61 U/L — ABNORMAL HIGH (ref 15–41)
Albumin: 4 g/dL (ref 3.5–5.0)
Alkaline Phosphatase: 94 U/L (ref 38–126)
Anion gap: 11 (ref 5–15)
BUN: 30 mg/dL — ABNORMAL HIGH (ref 8–23)
CO2: 28 mmol/L (ref 22–32)
Calcium: 9.8 mg/dL (ref 8.9–10.3)
Chloride: 102 mmol/L (ref 98–111)
Creatinine, Ser: 0.89 mg/dL (ref 0.61–1.24)
GFR, Estimated: >60 mL/min (ref >60)
Glucose, Bld: 128 mg/dL — ABNORMAL HIGH (ref 70–99)
Potassium: 3.7 mmol/L (ref 3.5–5.1)
Sodium: 141 mmol/L (ref 135–145)
Total Bilirubin: 0.6 mg/dL (ref 0.0–1.2)
Total Protein: 7 g/dL (ref 6.5–8.1)

## 2024-10-26 LAB — LACTIC ACID, PLASMA
Lactic Acid, Venous: 1 mmol/L (ref 0.5–1.9)
Lactic Acid, Venous: 1.1 mmol/L (ref 0.5–1.9)

## 2024-10-26 LAB — CBC WITH DIFFERENTIAL/PLATELET
Basophils Absolute: 0 K/uL (ref 0.0–0.1)
Basophils Relative: 0 %
Eosinophils Absolute: 0 K/uL (ref 0.0–0.5)
Eosinophils Relative: 0 %
HCT: 32.2 % — ABNORMAL LOW (ref 39.0–52.0)
Hemoglobin: 10 g/dL — ABNORMAL LOW (ref 13.0–17.0)
Lymphocytes Relative: 89 %
Lymphs Abs: 194.9 K/uL — ABNORMAL HIGH (ref 0.7–4.0)
MCH: 30.2 pg (ref 26.0–34.0)
MCHC: 31.1 g/dL (ref 30.0–36.0)
MCV: 97.3 fL (ref 80.0–100.0)
Monocytes Absolute: 13.1 K/uL — ABNORMAL HIGH (ref 0.1–1.0)
Monocytes Relative: 6 %
Neutro Abs: 11 K/uL — ABNORMAL HIGH (ref 1.7–7.7)
Neutrophils Relative %: 5 %
Platelets: 110 K/uL — ABNORMAL LOW (ref 150–400)
RBC: 3.31 MIL/uL — ABNORMAL LOW (ref 4.22–5.81)
RDW: 15.6 % — ABNORMAL HIGH (ref 11.5–15.5)
Smear Review: NORMAL
WBC: 219 K/uL (ref 4.0–10.5)
nRBC: 0 % (ref 0.0–0.2)

## 2024-10-26 LAB — PROTIME-INR
INR: 1.9 — ABNORMAL HIGH (ref 0.8–1.2)
Prothrombin Time: 22.9 s — ABNORMAL HIGH (ref 11.4–15.2)

## 2024-10-26 LAB — APTT: aPTT: 32 s (ref 24–36)

## 2024-10-26 MED ORDER — HYDROMORPHONE HCL 1 MG/ML IJ SOLN
0.5000 mg | INTRAMUSCULAR | Status: DC | PRN
  Administered 2024-10-27: 0.5 mg via INTRAVENOUS
  Filled 2024-10-26: qty 0.5

## 2024-10-26 MED ORDER — ACETAMINOPHEN 500 MG PO TABS
1000.0000 mg | ORAL_TABLET | Freq: Once | ORAL | Status: AC
  Administered 2024-10-26: 1000 mg via ORAL
  Filled 2024-10-26: qty 2

## 2024-10-26 MED ORDER — MAGNESIUM GLUCONATE 500 (27 MG) MG PO TABS
500.0000 mg | ORAL_TABLET | ORAL | Status: AC
  Administered 2024-10-26: 500 mg via ORAL
  Filled 2024-10-26: qty 1

## 2024-10-26 MED ORDER — MELATONIN 5 MG PO TABS
5.0000 mg | ORAL_TABLET | Freq: Every evening | ORAL | Status: DC | PRN
  Administered 2024-10-27: 5 mg via ORAL
  Filled 2024-10-26: qty 1

## 2024-10-26 MED ORDER — PROCHLORPERAZINE EDISYLATE 10 MG/2ML IJ SOLN: 5.0000 mg | Freq: Four times a day (QID) | INTRAMUSCULAR | Status: DC | PRN

## 2024-10-26 MED ORDER — IOHEXOL 350 MG/ML SOLN
100.0000 mL | Freq: Once | INTRAVENOUS | Status: AC | PRN
  Administered 2024-10-26: 100 mL via INTRAVENOUS

## 2024-10-26 MED ORDER — MORPHINE SULFATE (PF) 4 MG/ML IV SOLN
4.0000 mg | Freq: Once | INTRAVENOUS | Status: AC
  Administered 2024-10-26: 4 mg via INTRAVENOUS
  Filled 2024-10-26: qty 1

## 2024-10-26 MED ORDER — MELATONIN 5 MG PO TABS
5.0000 mg | ORAL_TABLET | ORAL | Status: AC
  Administered 2024-10-26: 5 mg via ORAL
  Filled 2024-10-26: qty 1

## 2024-10-26 MED ORDER — OXYCODONE HCL 5 MG PO TABS
5.0000 mg | ORAL_TABLET | Freq: Four times a day (QID) | ORAL | Status: DC | PRN
Start: 2024-10-26 — End: 2024-10-28
  Administered 2024-10-26 – 2024-10-28 (×2): 5 mg via ORAL
  Filled 2024-10-26 (×2): qty 1

## 2024-10-26 MED ORDER — VANCOMYCIN HCL 1250 MG/250ML IV SOLN
1250.0000 mg | Freq: Once | INTRAVENOUS | Status: AC
  Administered 2024-10-27: 1250 mg via INTRAVENOUS
  Filled 2024-10-26: qty 250

## 2024-10-26 MED ORDER — ACETAMINOPHEN 325 MG PO TABS: 650.0000 mg | ORAL_TABLET | Freq: Four times a day (QID) | ORAL | Status: DC | PRN

## 2024-10-26 MED ORDER — LACTATED RINGERS IV SOLN: INTRAVENOUS | Status: DC

## 2024-10-26 MED ORDER — POLYETHYLENE GLYCOL 3350 17 G PO PACK
17.0000 g | PACK | Freq: Every day | ORAL | Status: DC | PRN
  Administered 2024-10-27: 17 g via ORAL
  Filled 2024-10-26 (×2): qty 1

## 2024-10-26 MED ORDER — SODIUM CHLORIDE 0.9 % IV SOLN
2.0000 g | INTRAVENOUS | Status: DC
  Administered 2024-10-26: 2 g via INTRAVENOUS
  Filled 2024-10-26: qty 20

## 2024-10-26 NOTE — Progress Notes (Addendum)
 Discussed case in detail with EDP regarding large forearm hematoma on CT scan. Patient has palpable radial pulse for the EDP. CT scan shows compression of the forearm vessels by the hematoma with good contrast filling of the hand. Suspect spontaneous hematoma from chronic anticoagulation. No intervention from a vascular standpoint with palpable radial pulse. Hand surgery consultation may be helpful. If he needs admission Jol[facility] Pack would be preferred from our standpoint.  Debby SAILOR. Magda, MD [facility] Phone Number: [phone] 10/26/2024 1:02 PM

## 2024-10-26 NOTE — ED Triage Notes (Signed)
 Patient reports right arm since Saturday. States PCP did blood work and US . Reports he received a call telling him to come to the ER because his doctor did not like the US  results. Cannot recall results.

## 2024-10-26 NOTE — Progress Notes (Signed)
 Noted Dr. KANDICE so you are kept in the loop with his venous doppler u/s I advised pt to go to ED due to suspicion of pseudoaneurysm vs neoplasm, I was worried about circulatory. He went to ED today, likely to be admitted.

## 2024-10-26 NOTE — Consult Note (Signed)
 [facility]   ASSESSMENT / PLAN: 83 y.o. male with spontaneous right forearm hematoma. Reassuring vascular exam with strong right radial pulse. No role for vascular intervention. Recommend hand surgery consultation.    CHIEF COMPLAINT: right forearm pain  HISTORY OF PRESENT ILLNESS: Keith Jordan is a 83 y.o. male who presents to the hospital for evaluation of right forearm swelling, pain and discoloration. This has been ongoing for several days. Patient reports no inciting trauma or event to explain it. He does report a similar episode occurring in his thigh requiring admission. He is anticoagulated with Eliquis . Movement of his hand is limited by pain. He reports sensation in his hand.   Past Medical History:  Diagnosis Date   Abnormal breath sounds 10/11/2020   Acute sinusitis 01/29/2021   Advanced care planning/counseling discussion 09/01/2016   BPH (benign prostatic hyperplasia) 09/29/2018   Ear fullness, bilateral 04/03/2021   Health maintenance examination 09/01/2016   Hyperlipidemia    Irregular heart beat 03/24/2021   Ischemic optic neuropathy 2006   [place] [facility]   Leukocytosis 01/30/2021   Severe s/p ER eval 01/2021   Medicare annual wellness visit, subsequent 10/04/2019   Nausea without vomiting 03/24/2021   Personal history of colonic adenoma 07/30/2008   Prediabetes 07/05/2016   A1c 6.5% 03/2014    Prediabetes 07/05/2016   A1c 6.5% 03/2014    Primary osteoarthritis of knee    bilateral s/p L knee replacement, receives R knee injections libbie, [name])   Sebaceous cyst 01/30/2020   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    Past Surgical History:  Procedure Laterality Date   A-FLUTTER ABLATION N/A 07/11/2021   Procedure: A-FLUTTER ABLATION;  Surgeon: Inocencio Soyla Lunger, MD;  Location: [facility] INVASIVE CV LAB;  Service: Cardiovascular;  Laterality: N/A;   COLONOSCOPY  12/2013   no polyps, no rpt due [facility])   CYST EXCISION  01/2020    TOTAL KNEE ARTHROPLASTY Left 2005   [name]    Family History  Problem Relation Age of Onset   Blindness Mother 45       ?stroke in eyes   Lung disease Father        coal miner   Arthritis Father    Colon cancer Neg Hx    Pancreatic cancer Neg Hx    Stomach cancer Neg Hx    Esophageal cancer Neg Hx    Rectal cancer Neg Hx    Cancer Neg Hx    Diabetes Neg Hx    CAD Neg Hx    Stroke Neg Hx     Social History   Socioeconomic History   Marital status: Married    Spouse name: Not on file   Number of children: Not on file   Years of education: Not on file   Highest education level: Not on file  Occupational History   Not on file  Tobacco Use   Smoking status: Never   Smokeless tobacco: Never  Vaping Use   Vaping status: Never Used  Substance and Sexual Activity   Alcohol use: Yes    Comment: occasional   Drug use: No   Sexual activity: Not Currently  Other Topics Concern   Not on file  Social History Narrative   Lives with wife, 2 cats   Occ: retired counsellor   Activity: works in yard   Diet: some water, fruits/vegetables daily   Social Drivers of Corporate Investment Banker Strain: Low Risk  (10/23/2024)  Overall Financial Resource Strain (CARDIA)    Difficulty of Paying Living Expenses: Not hard at all  Food Insecurity: No Food Insecurity (10/23/2024)   Hunger Vital Sign    Worried About Running Out of Food in the Last Year: Never true    Ran Out of Food in the Last Year: Never true  Transportation Needs: No Transportation Needs (10/23/2024)   PRAPARE - Administrator, Civil Service (Medical): No    Lack of Transportation (Non-Medical): No  Physical Activity: Insufficiently Active (10/23/2024)   Exercise Vital Sign    Days of Exercise per Week: 3 days    Minutes of Exercise per Session: 30 min  Stress: No Stress Concern Present (10/23/2024)   [employer] of Occupational Health - Occupational Stress Questionnaire    Feeling of Stress:  Not at all  Social Connections: Moderately Isolated (10/23/2024)   Social Connection and Isolation Panel    Frequency of Communication with Friends and Family: Twice a week    Frequency of Social Gatherings with Friends and Family: Once a week    Attends Religious Services: Never    Database Administrator or Organizations: No    Attends Banker Meetings: Never    Marital Status: Married  Catering Manager Violence: Not At Risk (10/23/2024)   Humiliation, Afraid, Rape, and Kick questionnaire    Fear of Current or Ex-Partner: No    Emotionally Abused: No    Physically Abused: No    Sexually Abused: No    No Known Allergies  No current facility-administered medications for this encounter.   Current Outpatient Medications  Medication Sig Dispense Refill   acetaminophen  (TYLENOL ) 500 MG tablet Take 500 mg by mouth every 6 (six) hours as needed for mild pain.     apixaban  (ELIQUIS ) 5 MG TABS tablet Take 1 tablet (5 mg total) by mouth 2 (two) times daily. 60 tablet 5   Ascorbic Acid (VITAMIN C) 1000 MG tablet Take 500 mg by mouth 2 (two) times daily.     atorvastatin  (LIPITOR) 10 MG tablet Take 1 tablet (10 mg total) by mouth daily. 90 tablet 4   carboxymethylcellulose (REFRESH PLUS) 0.5 % SOLN Place 1 drop into both eyes 2 (two) times daily as needed (dry eyes).     carvedilol  (COREG ) 6.25 MG tablet TAKE 1 TABLET BY MOUTH TWICE A DAY 180 tablet 2   cephALEXin  (KEFLEX ) 500 MG capsule Take 1 capsule (500 mg total) by mouth 3 (three) times daily for 10 days. 30 capsule 0   doxycycline  (VIBRA -TABS) 100 MG tablet Take 1 tablet (100 mg total) by mouth 2 (two) times daily for 10 days. 20 tablet 0   melatonin 3 MG TABS tablet Take 3 mg by mouth at bedtime.     Multiple Vitamins-Minerals (MULTIVITAMIN WITH MINERALS) tablet Take 1 tablet by mouth daily. Unknown strenght     Multiple Vitamins-Minerals (PRESERVISION AREDS) CAPS Take 1 capsule by mouth in the morning and at bedtime. 180  capsule 3   sodium chloride  (OCEAN) 0.65 % SOLN nasal spray Place 1 spray into both nostrils as needed for congestion.     traMADol HCl 25 MG TABS Take 25 mg by mouth every 6 (six) hours as needed. 15 tablet 0   triamcinolone  (NASACORT ) 55 MCG/ACT AERO nasal inhaler Place 1 spray into the nose daily as needed (sinus congestion). 1 each    L-Theanine 200 MG CAPS Take 1 Capful by mouth at bedtime as needed (sleep maintenance insomnia). (  Patient not taking: Reported on 10/26/2024)     Olopatadine  HCl 0.2 % SOLN Apply 1 drop to eye daily. (Both) (Patient not taking: Reported on 10/26/2024) 2.5 mL 0    PHYSICAL EXAM Vitals:   10/26/24 1600 10/26/24 1705 10/26/24 1710 10/26/24 1830  BP: 138/65 (!) 140/70 (!) 140/70 (!) 152/62  Pulse: 68  69 73  Resp: 20  15 (!) 26  Temp:   98.6 F (37 C)   TempSrc:   Oral   SpO2: 98%  95% 94%   Elderly in no distress Regular rate and rhythm Unlabored breathing 2+ R radial pulse Hematoma in volar R forearm with ecchymosis spreading to the palmar hand. The compartments are compressible. Expected pain with palpation of the forearm. Limited range of motion in the hand, but is able to wiggle fingers. Sensation intact in the hand  PERTINENT LABORATORY AND RADIOLOGIC DATA  Most recent CBC    Latest Ref Rng & Units 10/26/2024    9:51 AM 10/02/2024    1:16 PM 06/26/2024   11:20 AM  CBC  WBC 4.0 - 10.5 K/uL 219.0  237.3  277.8   Hemoglobin 13.0 - 17.0 g/dL 89.9  89.3  88.5   Hematocrit 39.0 - 52.0 % 32.2  34.6  37.0   Platelets 150 - 400 K/uL 110  119  110      Most recent CMP    Latest Ref Rng & Units 10/26/2024    9:51 AM 10/25/2024    8:06 AM 10/02/2024    1:16 PM  CMP  Glucose 70 - 99 mg/dL 871  851  841   BUN 8 - 23 mg/dL 30  29  25    Creatinine 0.61 - 1.24 mg/dL 9.10  9.02  8.82   Sodium 135 - 145 mmol/L 141  140  141   Potassium 3.5 - 5.1 mmol/L 3.7  4.0  4.3   Chloride 98 - 111 mmol/L 102  101  103   CO2 22 - 32 mmol/L 28  30  33   Calcium   8.9 - 10.3 mg/dL 9.8  9.1  9.4   Total Protein 6.5 - 8.1 g/dL 7.0  6.7  6.7   Total Bilirubin 0.0 - 1.2 mg/dL 0.6  0.8  0.6   Alkaline Phos 38 - 126 U/L 94  80  90   AST 15 - 41 U/L 61  24  23   ALT 0 - 44 U/L 13  7  7      Renal function Estimated Creatinine Clearance: 57.5 mL/min (by C-G formula based on SCr of 0.89 mg/dL).  Hemoglobin A1C (no units)  Date Value  04/16/2014 6.5   Hgb A1c MFr Bld (%)  Date Value  10/25/2024 6.7 (H)    LDL (calc)  Date Value Ref Range Status  04/16/2015 86  Final  04/16/2015 86  Final   LDL Cholesterol  Date Value Ref Range Status  10/25/2024 41 0 - 99 mg/dL Final    CT angiogram personally reviewed. No evidence of active bleeding or pseudoaneurysm. Large hematoma in flexor muscles causing compression of the radial and ulnar arteries. Distally, the vasculature of the hand is well opacified.   Debby SAILOR. Magda, MD FACS [facility] Phone Number: [phone] 10/26/2024 7:07 PM   Total time spent on preparing this encounter including chart review, data review, collecting history, examining the patient, and coordinating care: 60 min  Portions of this report may have been transcribed  using voice recognition software.  Every effort has been made to ensure accuracy; however, inadvertent computerized transcription errors may still be present.

## 2024-10-26 NOTE — ED Provider Notes (Signed)
 [place] EMERGENCY DEPARTMENT AT [facility] Provider Note   CSN: [id number] Arrival date & time: 10/26/24  9170     Patient presents with: Arm Swelling   Keith Jordan is a 83 y.o. male with history of A-fib on Eliquis , diabetes, hyperlipidemia, splenic marginal zone lymphoma presents with complaints of right upper extremity swelling, warmth and pain.  Denies any injury or trauma.  Was evaluated by his primary doctor and started on an unknown antibiotic.  Had outpatient Doppler ultrasound which was reportedly negative for blood clot.  Reports compliance with his Eliquis .  Reports subjective fever and chills earlier this week.      HPI    Past Medical History:  Diagnosis Date   Abnormal breath sounds 10/11/2020   Acute sinusitis 01/29/2021   Advanced care planning/counseling discussion 09/01/2016   BPH (benign prostatic hyperplasia) 09/29/2018   Ear fullness, bilateral 04/03/2021   Health maintenance examination 09/01/2016   Hyperlipidemia    Irregular heart beat 03/24/2021   Ischemic optic neuropathy 2006   [place] [facility]   Leukocytosis 01/30/2021   Severe s/p ER eval 01/2021   Medicare annual wellness visit, subsequent 10/04/2019   Nausea without vomiting 03/24/2021   Personal history of colonic adenoma 07/30/2008   Prediabetes 07/05/2016   A1c 6.5% 03/2014    Prediabetes 07/05/2016   A1c 6.5% 03/2014    Primary osteoarthritis of knee    bilateral s/p L knee replacement, receives R knee injections libbie, [name])   Sebaceous cyst 01/30/2020   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 Authorizing Provider  acetaminophen  (TYLENOL ) 500 MG tablet Take 500 mg by mouth every 6 (six) hours as needed for mild pain.    [provider]  apixaban  (ELIQUIS ) 5 MG TABS tablet Take 1 tablet (5 mg total) by mouth 2 (two) times daily. 06/14/24   Tobb, [name], DO  Ascorbic Acid (VITAMIN C) 1000 MG tablet Take 500 mg by mouth 2 (two) times daily.    [provider]  atorvastatin  (LIPITOR) 10 MG tablet Take 1 tablet (10 mg total) by mouth daily. 11/01/23   Rilla Baller, MD  carboxymethylcellulose (REFRESH PLUS) 0.5 % SOLN Place 1 drop into both eyes 2 (two) times daily as needed (dry eyes).    [provider]  carvedilol  (COREG ) 6.25 MG tablet TAKE 1 TABLET BY MOUTH TWICE A DAY 04/14/24   Tobb, [name], DO  cephALEXin  (KEFLEX ) 500 MG capsule Take 1 capsule (500 mg total) by mouth 3 (three) times daily for 10 days. 10/24/24 11/03/24  Corwin Antu, FNP  doxycycline  (VIBRA -TABS) 100 MG tablet Take 1 tablet (100 mg total) by mouth 2 (two) times daily for 10 days. 10/24/24 11/03/24  Corwin Antu, FNP  L-Theanine 200 MG CAPS Take 1 Capful by mouth at bedtime as needed (sleep maintenance insomnia). 11/01/23   Rilla Baller, MD  Multiple Vitamins-Minerals (MULTIVITAMIN WITH MINERALS) tablet Take 1 tablet by mouth daily. Unknown strenght    [provider]  Multiple Vitamins-Minerals (PRESERVISION  AREDS) CAPS Take 1 capsule by mouth in the morning and at bedtime. 10/11/20   Rilla Baller, MD  Olopatadine  HCl 0.2 % SOLN Apply 1 drop to eye daily. (Both) 05/29/22   Rilla Baller, MD  sodium chloride  (OCEAN) 0.65 % SOLN nasal spray Place 1 spray into both nostrils as needed for congestion.    [provider]  traMADol HCl 25 MG TABS Take 25 mg by mouth every 6 (six) hours as needed. 10/24/24   Dugal, Tabitha, FNP  triamcinolone  (NASACORT ) 55 MCG/ACT AERO nasal inhaler Place 1 spray into  the nose daily as needed (sinus congestion). 11/17/21   Rilla Baller, MD    Allergies: Patient has no known allergies.    Review of Systems  Updated Vital Signs BP (!) 142/101   Pulse 68   Temp 98.4 F (36.9 C)   Resp (!) 24   SpO2 95%   Physical Exam Vitals and nursing note reviewed.  Constitutional:      General: He is not in acute distress.    Appearance: He is well-developed.  HENT:     Head: Normocephalic and atraumatic.  Eyes:     Conjunctiva/sclera: Conjunctivae normal.  Cardiovascular:     Rate and Rhythm: Normal rate and regular rhythm.     Heart sounds: No murmur heard. Pulmonary:     Effort: Pulmonary effort is normal. No respiratory distress.     Breath sounds: Normal breath sounds.  Abdominal:     Palpations: Abdomen is soft.     Tenderness: There is no abdominal tenderness.  Musculoskeletal:        General: Swelling present.     Cervical back: Neck supple.     Comments: 3+ right upper extremity pitting edema with associated erythema, ecchymosis warmth and tenderness.  He has 2+ radial pulse with soft compartments and no crepitus.  1+ pitting edema in the lower extremities  Skin:    General: Skin is warm and dry.     Capillary Refill: Capillary refill takes less than 2 seconds.  Neurological:     Mental Status: He is alert.  Psychiatric:        Mood and Affect: Mood normal.        (all labs ordered are listed, but only abnormal results are displayed) Labs Reviewed  CBC WITH DIFFERENTIAL/PLATELET - Abnormal; Notable for the following components:      Result Value   WBC 219.0 (*)    RBC 3.31 (*)    Hemoglobin 10.0 (*)    HCT 32.2 (*)    RDW 15.6 (*)    Platelets 110 (*)    Neutro Abs 11.0 (*)    Lymphs Abs 194.9 (*)    Monocytes Absolute 13.1 (*)    All other components within normal limits  COMPREHENSIVE METABOLIC PANEL WITH GFR - Abnormal; Notable for the following components:   Glucose, Bld 128 (*)    BUN 30 (*)    AST 61 (*)     All other components within normal limits  PROTIME-INR - Abnormal; Notable for the following components:   Prothrombin Time 22.9 (*)    INR 1.9 (*)    All other components within normal limits  CULTURE, BLOOD (ROUTINE X 2)  CULTURE, BLOOD (ROUTINE X 2)  LACTIC ACID, PLASMA  LACTIC ACID, PLASMA  APTT    EKG: None  Radiology: CT ANGIO UP EXTREM RIGHT W &/OR WO CONTRAST Result Date: 10/26/2024 EXAM: CTA RIGHT UPPER EXTREMITY 10/26/2024 11:16:21 AM TECHNIQUE: Contrast-enhanced  computed tomography angiography of the upper extremity was performed with multiplanar reconstructions. Without and with IV contrast, including 100 mL of iohexol  (OMNIPAQUE ) 350 MG/ML injection, was administered. Automated exposure control, iterative reconstruction, and/or weight based adjustment of the mA/kV was utilized to reduce the radiation dose to as low as reasonably achievable. COMPARISON: None available. CLINICAL HISTORY: reeval forearm lesion seen on ultrasound, ultrasound had concern for possible pseudoaneurysm FINDINGS: ARTERIAL: The axillary and brachial arteries are patent with no stenosis, occlusion, aneurysm or dissection. The mass causes near occlusive stenosis of the ulnar artery throughout its course. There is also near occlusive stenosis of the radial artery proximally. Interosseous arteries are also markedly compressed by the mass. No abnormal arterial enhancement pattern is observed. There is no active hemorrhage or arteriovenous shunting demonstrated. VENOUS: The axillary, brachial, cephalic, basilic and median cubital veins are patent with no thrombosis. The superficial veins are patent and opacified. BONES AND SOFT TISSUES: There is ill-defined expansion of the flexor musculature present within the proximal and mid forearm with suggestion of an infiltrating hyperdense mass or fluid collection measuring approximately 5.6 x 5.0 x 15 cm. There is no apparent active hemorrhage within the presumed hematoma. The  findings appear to represent a hematoma and not a pseudoaneurysm. There is moderate subcutaneous soft tissue edema throughout the right upper extremity. No significant osseous abnormalities seen within the field of view. IMPRESSION: 1. Ill-defined expansion of the flexor musculature in the proximal and mid right forearm with an infiltrating hyperdense mass/fluid collection measuring approximately 5.6 x 5.0 x 15 cm, consistent with a hematoma; no active hemorrhage or arteriovenous shunting identified. 2. Near-occlusive stenosis of the ulnar artery throughout its course and proximal near-occlusive stenosis of the radial artery, with marked compression of the interosseous arteries by the mass. 3. Moderate subcutaneous soft tissue edema throughout the right upper extremity. Electronically signed by: Evalene Coho MD 10/26/2024 12:14 PM EDT RP Workstation: [id number]   DG Forearm Right Result Date: 10/26/2024 CLINICAL DATA:  Right arm pain 5 days.  Cellulitis. EXAM: RIGHT FOREARM - 2 VIEW COMPARISON:  None Available. FINDINGS: Exam demonstrates degenerative changes over the radiocarpal joint, carpal bones and hand. Mild irregularity over the distal radial metaphysis likely chronic. Right elbow is unremarkable. No acute fracture or dislocation. IMPRESSION: 1. No acute findings. 2. Degenerative changes of the wrist and hand. Electronically Signed   By: Toribio Agreste M.D.   On: 10/26/2024 10:32   DG Humerus Right Result Date: 10/26/2024 CLINICAL DATA:  Possible right arm cellulitis. EXAM: RIGHT HUMERUS - 2+ VIEW COMPARISON:  None Available. FINDINGS: The right humerus is normal without evidence of fracture, dislocation or focal lytic/sclerotic lesion. No air within the soft tissues. Remainder of the exam is unremarkable. IMPRESSION: No acute findings. Electronically Signed   By: Toribio Agreste M.D.   On: 10/26/2024 10:30   VAS US  UPPER EXTREMITY VENOUS DUPLEX Result Date: 10/24/2024 UPPER VENOUS STUDY  Patient  Name:  Keith Jordan  Date of Exam:   10/24/2024 Medical Rec #: [id number]       Accession #:    [id number] Date of Birth: September 30, 1941       Patient Gender: M Patient Age:   67 years Exam Location:  [address] Procedure:      VAS US  UPPER EXTREMITY VENOUS DUPLEX Referring Phys: GINGER DUGAL --------------------------------------------------------------------------------  Indications: Swelling Comparison Study: N/A Performing Technologist: Dena Pane  Examination Guidelines: A complete evaluation includes B-mode imaging, spectral Doppler, color Doppler, and power Doppler as needed  of all accessible portions of each vessel. Bilateral testing is considered an integral part of a complete examination. Limited examinations for reoccurring indications may be performed as noted.  Right Findings: +----------+------------+---------+-----------+----------+-------+ RIGHT     CompressiblePhasicitySpontaneousPropertiesSummary +----------+------------+---------+-----------+----------+-------+ IJV           Full       Yes       Yes                      +----------+------------+---------+-----------+----------+-------+ Subclavian    Full       Yes       Yes                      +----------+------------+---------+-----------+----------+-------+ Axillary      Full       Yes       Yes                      +----------+------------+---------+-----------+----------+-------+ Brachial      Full       Yes       Yes                      +----------+------------+---------+-----------+----------+-------+ Radial        Full                                          +----------+------------+---------+-----------+----------+-------+ Ulnar         Full                                          +----------+------------+---------+-----------+----------+-------+ Cephalic      Full                                          +----------+------------+---------+-----------+----------+-------+  Basilic       Full                                          +----------+------------+---------+-----------+----------+-------+  Left Findings: +----+------------+---------+-----------+----------+-------+ LEFTCompressiblePhasicitySpontaneousPropertiesSummary +----+------------+---------+-----------+----------+-------+ IJV     Full       Yes       Yes                      +----+------------+---------+-----------+----------+-------+  Summary:  Right: -No evidence of deep vein thrombosis in the upper extremity. No evidence of superficial vein thrombosis in the upper extremity. No evidence of thrombosis in the subclavian.  -There is a heterogenous, vascularized area of the proximal medial right forearm, in the area of concern, measuring 4.5 x 4.2 cm. No clear pseudoaneurysm neck was identified, however given to-fro flow in some areas of the lesion, as well as low-resistant flow in other areas of this lesion, cannot exclude pseudoaneurysm versus neoplasm. Further imaging may be warranted if clinically indicated.  *See table(s) above for measurements and observations.  Diagnosing physician: Debby Robertson Electronically signed by Debby Robertson on 10/24/2024 at 4:54:04 PM.    Final      Procedures   Medications Ordered in the ED  iohexol  (OMNIPAQUE ) 350 MG/ML injection 100 mL (100 mLs Intravenous Contrast Given 10/26/24 1103)  acetaminophen  (TYLENOL ) tablet 1,000 mg (1,000 mg Oral Given 10/26/24 1329)    Clinical Course as of 10/26/24 1517  Thu Oct 26, 2024  0916 Patient with history of diabetes, A-fib on Eliquis  evaluated for right upper extremities swelling x 1 week.  Upon arrival he is hemodynamically stable.  On exam patient has 3+ pitting edema involving the right upper extremity with increased warmth, tenderness, erythema and ecchymosis.  Radial pulses are 2+, there is no crepitus.  Compartments are soft.  Will attempt to obtain results of outpatient ultrasound.  Will obtain x-rays to  evaluate for any evidence of subcutaneous emphysema and begin infectious workup. [JT]  1047 Outpatient ultrasound without any evidence of DVT, there is a heterogeneous region in the forearm, pseudoaneurysm versus neoplasm cannot be ruled out. [JT]  1048 DG Forearm Right No acute process, no evidence of gas [JT]  1054 CBC with Differential(!!) Significant leukocytosis at baseline.  Consistent with known malignancy [JT]  1054 CBC with Differential(!!) [JT]  1055 Protime-INR(!) Appropriately elevated [JT]  1055 Lactic acid, plasma Without elevation [JT]  1055 APTT Within normal limits [JT]  1055 Comprehensive metabolic panel(!) Mildly elevated AST [JT]  1257 Discussed patient with vascular, Dr. Magda.  He was able to review the patient's images and CT scans.  Overall most consistent with spontaneous hematoma.  Given that the patient is well-perfused with 2+ radial pulse.  No surgical indication at this time.  Recommended compression and observation.  Would consider holding Eliquis  for a period as well. [JT]  1344 Discussed patient with Dr. Tobie, agreed for admission.  Requested I reach out to hand surgery.  Hand consulted. [JT]  1440 Discussed patient with Dr. Delene.  Do not feel that there is anything that hand surgery had to offer at this time.  Could consider Ace bandage for swelling [JT]    Clinical Course User Index [JT] Donnajean Lynwood DEL, PA-C                                 Medical Decision Making Amount and/or Complexity of Data Reviewed Labs: ordered. Decision-making details documented in ED Course. Radiology: ordered. Decision-making details documented in ED Course.  Risk OTC drugs. Prescription drug management. Decision regarding hospitalization.   This patient presents to the ED with chief complaint(s) of arm swelling.  The complaint involves an extensive differential diagnosis and also carries with it a high risk of complications and morbidity.   Pertinent past  medical history as listed in HPI  The differential diagnosis includes  Outpatient ultrasound negative for DVT.  He is additionally compliant with his Eliquis .  There is no crepitus or evidence of subcutaneous emphysema.  Do not suspect necrotizing fasciitis.  His CT is suggestive of some vascular compromise, however he has 2+ radial pulses and appears to be neurovascular intact with good feeling.  His exam is additionally concerning for possible cellulitis, however his white count is at baseline.  He is afebrile and his CT is consistent with a hematoma.  Given that he noted some improvement with outpatient antibiotic course.  Will administer dose of Rocephin here in the ED.   Additional history obtained: Records reviewed Care Everywhere/External Records  Disposition:   Patient be admitted for further workup and  Social Determinants of Health:   none  This note was dictated with voice recognition software.  Despite best efforts at proofreading, errors may have occurred which can change the documentation meaning.       Final diagnoses:  Hematoma    ED Discharge Orders     None          Donnajean Lynwood VEAR DEVONNA 10/26/24 1517    Rogelia Jerilynn RAMAN, MD 10/29/24 [phone]

## 2024-10-26 NOTE — Progress Notes (Signed)
 Pt was advised yesterday to go to the Er.  It however does not look like he went to the ER.  Can we call to check on his status? Did he go somewhere outside of our system and or how is the arm looking?

## 2024-10-26 NOTE — Progress Notes (Signed)
 Plan of Care Note for accepted transfer  Patient: Keith Jordan    FMW:[id number]  DOA: 10/26/2024     Nursing staff, Please call [facility] Admits & Consults System-Wide number on Amion as soon as patient's arrival to the unit (not the listed attending) so that the appropriate admitting provider can evaluate the pt. ASAP to avoid any delay in care.  Facility requesting transfer: [facility] Requesting Provider: Donnajean Lynwood DEL, PA-C   Reason for transfer: Admission Facility course: Patient with PMH of A-fib on anticoagulation, HTN, marginal zone lymphoma with leukocyte count more than 180k sees Dr. Federico.  Presented to the hospital with complaints of right arm swelling. Ongoing for last 1 to 2 weeks. Progressively worsening. Outpatient ultrasound negative for any DVT.  Concern for possible pseudoaneurysm. Was seen by PCP on 10/28 and started on Keflex . Presented to [facility] ED. CT scan of the right upper extremity shows evidence of hematoma of the flexor musculature 5.6 x 5 x 15 cm with near occlusive stenosis of the ulnar artery and radial artery. Per EDP patient has limited ROM at wrist joint.  Still has some pulse.  Pain reasonably controlled. Has chronic thrombocytopenia due to his marginal zone lymphoma also appears to be improving.  EDP discussed with vascular surgery.  Recommended no further workup for now.  Hand surgery consult was recommended.  Per EDP hematoma and swelling appears to be worsening over the last visit with PCP. And recommended admission for observation. I agree with the admission for observation. Plan of care: The patient is accepted for admission to Telemetry unit, at [facility]. Patient should not go to any other campus. Upon arrival patient will require formal vascular surgery consultation. Requested ED provider to discuss case with hand surgery as well. May need hematology consultation if the platelet counts trend down or hemoglobin worsens.  Sees Dr.  Federico outpatient.  Author: Yetta Blanch, MD  10/26/2024  Check [email or web address] for on-call coverage.

## 2024-10-26 NOTE — ED Notes (Signed)
Iona Beard with cl called for transport

## 2024-10-26 NOTE — H&P (Incomplete)
 History and Physical  Keith Jordan FMW:[id number] DOB: January 28, 1941 DOA: 10/26/2024  Referring physician: Accepted by Dr. Tobie, [facility], Hospitalist service.  PCP: Rilla Baller, MD  Outpatient Specialists: Cardiology, oncology. Patient coming from: Home through [facility] ED.  Chief Complaint: Right arm swelling and redness.  HPI: Keith Jordan is a 83 y.o. 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  He noticed the swelling on Saturday, 5 days ago and it gradually progressed.  Denies any injury or trauma.  He was seen by PCP yesterday and started on antibiotics for suspected cellulitis.  Endorses compliance with his home medications including Eliquis .  Last dose of DOAC was taken the morning of 10/26/2024.  In the ER, right upper extremity CT scan suggestive of some vascular compromise, likely a hematoma, with palpable radial pulse.  EDP discussed the case with vascular surgery.  Recommended no further workup, hand surgery consult was recommended.  The patient received Rocephin in the ER.  Admitted by [facility], hospitalist service.  Accepted by Dr. Tobie, transferred to [facility] telemetry unit as observation status.  At the time of this visit the patient is alert oriented x 3.  His right upper extremity is significantly edematous.  He is unable to close his hand due to the severe hematoma and edema.  His right radial pulse is palpable.  Hand surgery, Dr. Delene consulted to assist with the management due to the involvement of his right hand.  ED Course: Temperature 99.2.  BP 130s over 65, pulse 80, respiration rate 18, O2 saturation 94% on room air.  Review of Systems: Review of systems as noted in  the HPI. All other systems reviewed and are negative.   Past Medical History:  Diagnosis Date   Abnormal breath sounds 10/11/2020   Acute sinusitis 01/29/2021   Advanced care planning/counseling discussion 09/01/2016   BPH (benign prostatic hyperplasia) 09/29/2018   Ear fullness, bilateral 04/03/2021   Health maintenance examination 09/01/2016   Hyperlipidemia    Irregular heart beat 03/24/2021   Ischemic optic neuropathy 2006   [place] [facility]   Leukocytosis 01/30/2021   Severe s/p ER eval 01/2021   Medicare annual wellness visit, subsequent 10/04/2019   Nausea without vomiting 03/24/2021   Personal history of colonic adenoma 07/30/2008   Prediabetes 07/05/2016   A1c 6.5% 03/2014    Prediabetes 07/05/2016   A1c 6.5% 03/2014    Primary osteoarthritis of knee    bilateral s/p L knee replacement, receives R knee injections libbie, [name])   Sebaceous cyst 01/30/2020   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   Past Surgical History:  Procedure Laterality Date   A-FLUTTER ABLATION N/A 07/11/2021   Procedure: A-FLUTTER ABLATION;  Surgeon: Inocencio Soyla Lunger, MD;  Location: [facility] INVASIVE CV LAB;  Service: Cardiovascular;  Laterality: N/A;   COLONOSCOPY  12/2013   no polyps, no rpt due [place])   CYST EXCISION  01/2020   TOTAL KNEE ARTHROPLASTY Left 2005   [name]    Social History:  reports that he has never smoked. He has never used smokeless tobacco. He reports current alcohol use. He reports that he does not use drugs.   No Known Allergies  Family History  Problem Relation Age of Onset   Blindness Mother 79       ?  stroke in eyes   Lung disease Father        coal miner   Arthritis Father    Colon cancer Neg Hx    Pancreatic cancer Neg Hx    Stomach cancer Neg Hx    Esophageal cancer Neg Hx    Rectal cancer Neg Hx    Cancer Neg Hx    Diabetes Neg Hx    CAD Neg Hx    Stroke Neg Hx       Prior to Admission medications   Medication Sig Start Date End Date Taking?  Authorizing Provider  acetaminophen  (TYLENOL ) 500 MG tablet Take 500 mg by mouth every 6 (six) hours as needed for mild pain.   Yes [provider]  apixaban  (ELIQUIS ) 5 MG TABS tablet Take 1 tablet (5 mg total) by mouth 2 (two) times daily. 06/14/24  Yes Tobb, [name], DO  Ascorbic Acid (VITAMIN C) 1000 MG tablet Take 500 mg by mouth 2 (two) times daily.   Yes [provider]  atorvastatin  (LIPITOR) 10 MG tablet Take 1 tablet (10 mg total) by mouth daily. 11/01/23  Yes Rilla Baller, MD  carboxymethylcellulose (REFRESH PLUS) 0.5 % SOLN Place 1 drop into both eyes 2 (two) times daily as needed (dry eyes).   Yes [provider]  carvedilol  (COREG ) 6.25 MG tablet TAKE 1 TABLET BY MOUTH TWICE A DAY 04/14/24  Yes Tobb, [name], DO  cephALEXin  (KEFLEX ) 500 MG capsule Take 1 capsule (500 mg total) by mouth 3 (three) times daily for 10 days. 10/24/24 11/03/24 Yes Dugal, Tabitha, FNP  doxycycline  (VIBRA -TABS) 100 MG tablet Take 1 tablet (100 mg total) by mouth 2 (two) times daily for 10 days. 10/24/24 11/03/24 Yes Dugal, Tabitha, FNP  melatonin 3 MG TABS tablet Take 3 mg by mouth at bedtime.   Yes [provider]  Multiple Vitamins-Minerals (MULTIVITAMIN WITH MINERALS) tablet Take 1 tablet by mouth daily. Unknown strenght   Yes [provider]  Multiple Vitamins-Minerals (PRESERVISION AREDS) CAPS Take 1 capsule by mouth in the morning and at bedtime. 10/11/20  Yes Rilla Baller, MD  sodium chloride  (OCEAN) 0.65 % SOLN nasal spray Place 1 spray into both nostrils as needed for congestion.   Yes [provider]  traMADol HCl 25 MG TABS Take 25 mg by mouth every 6 (six) hours as needed. 10/24/24  Yes Dugal, Ginger, FNP  triamcinolone  (NASACORT ) 55 MCG/ACT AERO nasal inhaler Place 1 spray into the nose daily as needed (sinus congestion). 11/17/21  Yes Rilla Baller, MD  L-Theanine 200 MG CAPS Take 1 Capful by mouth at bedtime as needed (sleep maintenance  insomnia). Patient not taking: Reported on 10/26/2024 11/01/23   Rilla Baller, MD  Olopatadine  HCl 0.2 % SOLN Apply 1 drop to eye daily. (Both) Patient not taking: Reported on 10/26/2024 05/29/22   Rilla Baller, MD    Physical Exam: BP 137/65 (BP Location: Left Arm)   Pulse 80   Temp 99.2 F (37.3 C) (Oral)   Resp 18   SpO2 94%   General: 83 y.o. year-old male well developed well nourished in no acute distress.  Alert and oriented x3. Cardiovascular: Regular rate and rhythm with no rubs or gallops.  No thyromegaly or JVD noted.  No lower extremity edema. 2/4 pulses in all 4 extremities. Respiratory: Clear to auscultation with no wheezes or rales. Good inspiratory effort. Abdomen: Soft nontender nondistended with normal bowel sounds x4 quadrants. Muskuloskeletal: Right upper extremity with severe hematoma, involving the right hand.  Neuro: CN II-XII intact, strength, sensation, reflexes Skin: Right upper extremity erythematous, edematous, warm, and tender. Psychiatry: Judgement and insight appear normal. Mood is appropriate for condition and setting          Labs on Admission:  Basic Metabolic Panel: Recent Labs  Lab 10/25/24 0806 10/26/24 0951  NA 140 141  K 4.0 3.7  CL 101 102  CO2 30 28  GLUCOSE 148* 128*  BUN 29* 30*  CREATININE 0.97 0.89  CALCIUM  9.1 9.8   Liver Function Tests: Recent Labs  Lab 10/25/24 0806 10/26/24 0951  AST 24 61*  ALT 7 13  ALKPHOS 80 94  BILITOT 0.8 0.6  PROT 6.7 7.0  ALBUMIN 4.0 4.0   No results for input(s): LIPASE, AMYLASE in the last 168 hours. No results for input(s): AMMONIA in the last 168 hours. CBC: Recent Labs  Lab 10/26/24 0951  WBC 219.0*  NEUTROABS 11.0*  HGB 10.0*  HCT 32.2*  MCV 97.3  PLT 110*   Cardiac Enzymes: No results for input(s): CKTOTAL, CKMB, CKMBINDEX, TROPONINI in the last 168 hours.  BNP (last 3 results) No results for input(s): BNP in the last 8760 hours.  ProBNP (last 3  results) No results for input(s): PROBNP in the last 8760 hours.  CBG: No results for input(s): GLUCAP in the last 168 hours.  Radiological Exams on Admission: CT ANGIO UP EXTREM RIGHT W &/OR WO CONTRAST Result Date: 10/26/2024 EXAM: CTA RIGHT UPPER EXTREMITY 10/26/2024 11:16:21 AM TECHNIQUE: Contrast-enhanced computed tomography angiography of the upper extremity was performed with multiplanar reconstructions. Without and with IV contrast, including 100 mL of iohexol  (OMNIPAQUE ) 350 MG/ML injection, was administered. Automated exposure control, iterative reconstruction, and/or weight based adjustment of the mA/kV was utilized to reduce the radiation dose to as low as reasonably achievable. COMPARISON: None available. CLINICAL HISTORY: reeval forearm lesion seen on ultrasound, ultrasound had concern for possible pseudoaneurysm FINDINGS: ARTERIAL: The axillary and brachial arteries are patent with no stenosis, occlusion, aneurysm or dissection. The mass causes near occlusive stenosis of the ulnar artery throughout its course. There is also near occlusive stenosis of the radial artery proximally. Interosseous arteries are also markedly compressed by the mass. No abnormal arterial enhancement pattern is observed. There is no active hemorrhage or arteriovenous shunting demonstrated. VENOUS: The axillary, brachial, cephalic, basilic and median cubital veins are patent with no thrombosis. The superficial veins are patent and opacified. BONES AND SOFT TISSUES: There is ill-defined expansion of the flexor musculature present within the proximal and mid forearm with suggestion of an infiltrating hyperdense mass or fluid collection measuring approximately 5.6 x 5.0 x 15 cm. There is no apparent active hemorrhage within the presumed hematoma. The findings appear to represent a hematoma and not a pseudoaneurysm. There is moderate subcutaneous soft tissue edema throughout the right upper extremity. No significant  osseous abnormalities seen within the field of view. IMPRESSION: 1. Ill-defined expansion of the flexor musculature in the proximal and mid right forearm with an infiltrating hyperdense mass/fluid collection measuring approximately 5.6 x 5.0 x 15 cm, consistent with a hematoma; no active hemorrhage or arteriovenous shunting identified. 2. Near-occlusive stenosis of the ulnar artery throughout its course and proximal near-occlusive stenosis of the radial artery, with marked compression of the interosseous arteries by the mass. 3. Moderate subcutaneous soft tissue edema throughout the right upper extremity. Electronically signed by: Evalene Coho MD 10/26/2024 12:14 PM EDT RP Workstation: [id number]   DG Forearm Right Result Date: 10/26/2024 CLINICAL DATA:  Right arm  pain 5 days.  Cellulitis. EXAM: RIGHT FOREARM - 2 VIEW COMPARISON:  None Available. FINDINGS: Exam demonstrates degenerative changes over the radiocarpal joint, carpal bones and hand. Mild irregularity over the distal radial metaphysis likely chronic. Right elbow is unremarkable. No acute fracture or dislocation. IMPRESSION: 1. No acute findings. 2. Degenerative changes of the wrist and hand. Electronically Signed   By: Toribio Agreste M.D.   On: 10/26/2024 10:32   DG Humerus Right Result Date: 10/26/2024 CLINICAL DATA:  Possible right arm cellulitis. EXAM: RIGHT HUMERUS - 2+ VIEW COMPARISON:  None Available. FINDINGS: The right humerus is normal without evidence of fracture, dislocation or focal lytic/sclerotic lesion. No air within the soft tissues. Remainder of the exam is unremarkable. IMPRESSION: No acute findings. Electronically Signed   By: Toribio Agreste M.D.   On: 10/26/2024 10:30    EKG: I independently viewed the EKG done and my findings are as followed: None available at the time of this visit.  Assessment/Plan Present on Admission:  Hematoma of arm, right, initial encounter  Principal Problem:   Hematoma of arm, right,  initial encounter  Severe spontaneous right upper extremity hematoma involving the right hand, POA Home Eliquis  on hold. Seen by vascular surgery, nothing to do from a vascular standpoint. Recommended hand surgery evaluation. Hand surgery, Dr. Delene, consulted to assist with the management Pain control as needed  Paroxysmal atrial flutter on Eliquis  Continue to hold off home Eliquis  Last dose of Eliquis  was taken on 10/26/2024 morning. Monitor on telemetry  Splenic marginal zone B-cell lymphoma  WBC 219 K, platelet count of 110 K. Follows with hematology/oncology Dr. Federico outpatient. Gentle IV fluid hydration. Consult hematology oncology to help guide management.  Anemia of chronic disease Hemoglobin 10.0 Home Eliquis  on hold Continue to monitor H&H  Chronic HFpEF Euvolemic Last 2D echo done on 05/21/2021 revealed LVEF 70 to 75% with grade 1 diastolic dysfunction. Monitor strict I's and O's and daily weight.  Generalized weakness PT OT assessment Fall precautions.   Critical care time: 65 minutes.   DVT prophylaxis: SCDs.  Code Status: Full code.  Family Communication: None at bedside.  Disposition Plan: Admitted to telemetry unit.  Consults called: Vascular surgery, hand surgery.  Admission status: Inpatient status.   Status is: Inpatient. The patient requires at least 2 midnights for further evaluation and treatment of present condition.    Terry LOISE Hurst MD [facility] Pager [phone]  If 7PM-7AM, please contact night-coverage [email or web address] Password [email or web address]  10/26/2024, 10:21 PM

## 2024-10-26 NOTE — Plan of Care (Signed)
  Problem: Clinical Measurements: Goal: Diagnostic test results will improve Outcome: Not Progressing   Problem: Clinical Measurements: Goal: Cardiovascular complication will be avoided Outcome: Not Progressing   Problem: Elimination: Goal: Will not experience complications related to bowel motility Outcome: Not Progressing   Problem: Pain Managment: Goal: General experience of comfort will improve and/or be controlled Outcome: Not Progressing   Problem: Safety: Goal: Ability to remain free from injury will improve Outcome: Not Progressing   Problem: Skin Integrity: Goal: Risk for impaired skin integrity will decrease Outcome: Not Progressing

## 2024-10-27 ENCOUNTER — Ambulatory Visit: Admitting: Family

## 2024-10-27 DIAGNOSIS — S40021A Contusion of right upper arm, initial encounter: Secondary | ICD-10-CM | POA: Diagnosis not present

## 2024-10-27 LAB — MRSA NEXT GEN BY PCR, NASAL: MRSA by PCR Next Gen: NOT DETECTED

## 2024-10-27 MED ORDER — ACETAMINOPHEN 500 MG PO TABS
1000.0000 mg | ORAL_TABLET | Freq: Three times a day (TID) | ORAL | Status: DC
  Administered 2024-10-27 – 2024-10-28 (×4): 1000 mg via ORAL
  Filled 2024-10-27 (×4): qty 2

## 2024-10-27 MED ORDER — ATORVASTATIN CALCIUM 10 MG PO TABS
10.0000 mg | ORAL_TABLET | Freq: Every day | ORAL | Status: DC
  Administered 2024-10-27 – 2024-10-28 (×2): 10 mg via ORAL
  Filled 2024-10-27 (×2): qty 1

## 2024-10-27 MED ORDER — CARVEDILOL 6.25 MG PO TABS
6.2500 mg | ORAL_TABLET | Freq: Two times a day (BID) | ORAL | Status: DC
  Administered 2024-10-27 – 2024-10-28 (×3): 6.25 mg via ORAL
  Filled 2024-10-27 (×3): qty 1

## 2024-10-27 MED ORDER — VANCOMYCIN HCL 1250 MG/250ML IV SOLN: 1250.0000 mg | INTRAVENOUS | Status: DC

## 2024-10-27 NOTE — Evaluation (Signed)
 Physical Therapy Evaluation and Discharge Patient Details Name: Keith Jordan MRN: [id number] DOB: [birth year] Today's Date: 10/27/2024  History of Present Illness  Keith Jordan is a 83 y.o. male who presented to [facility] ED 10/26/24 referred to by his PCP d/t RUE swelling, warmth, tenderness, and redness. CT shows evidence of hematoma of the flexor musculature 5.6 x 5 x 15 cm with near occlusive stenosis of the ulnar artery and radial artery. 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   Clinical Impression  Pt admitted with above diagnosis. PTA, pt was independent with functional mobility, ADLs/IADLs, and driving. He lives with his wife in a one story house with 1 STE. Pt performed bed mobility with modI and OOB mobility with supervision without an AD. He ambulated ~28ft with a reciprocal gait pattern and ascended/descended 5 steps with unilateral UE support on railing. Pt appears to be near his baseline function, no acute PT needs identified. He is currently limited by RUE swelling and pain. Pt is right hand dominant. Would benefit from OT evaluation. Recommend OOB<>3x/day and frequent walks in hallway with staff assistance.     If plan is discharge home, recommend the following: A little help with bathing/dressing/bathroom;Assistance with cooking/housework;Assistance with feeding;Assist for transportation;Help with stairs or ramp for entrance   Can travel by private vehicle        Equipment Recommendations None recommended by PT  Recommendations for Other Services  OT consult    Functional Status Assessment Patient has not had a recent decline in their functional status     Precautions / Restrictions Precautions Precautions: Fall Recall of Precautions/Restrictions: Intact Restrictions Weight Bearing Restrictions Per Provider Order: Yes RUE Weight Bearing Per Provider Order: Weight  bearing as tolerated      Mobility  Bed Mobility Overal bed mobility: Modified Independent             General bed mobility comments: Pt performed supine>sit from a flat bed, without rails. He took increased time to sit up on L side.    Transfers Overall transfer level: Needs assistance Equipment used: None Transfers: Sit to/from Stand, Bed to chair/wheelchair/BSC Sit to Stand: Supervision   Step pivot transfers: Supervision       General transfer comment: Pt stood from lowest bed height. He pushed up with BUE support. Transferred to recliner chair. Assist to manage IV pole and increase safety awaraness with lines. Good eccentric control with sitting.    Ambulation/Gait Ambulation/Gait assistance: Supervision Gait Distance (Feet): 200 Feet Assistive device: None Gait Pattern/deviations: Step-through pattern, Wide base of support Gait velocity: WFL Gait velocity interpretation: 1.31 - 2.62 ft/sec, indicative of limited community ambulator   General Gait Details: Pt ambulated with a reciprocal gait pattern, even weight shift, and good foot clearence. He navigated room/hallway well, no LOB. PT managed IV pole and line.  Stairs Stairs: Yes Stairs assistance: Supervision Stair Management: One rail Right, Forwards, Step to pattern Number of Stairs: 5 General stair comments: Pt ascended/descended leading with RLE. He took one step at a time. PT managed IV pole and line.  Wheelchair Mobility     Tilt Bed    Modified Rankin (Stroke Patients Only)       Balance Overall balance assessment: Mild deficits observed, not formally tested  Pertinent Vitals/Pain Pain Assessment Pain Assessment: Faces Faces Pain Scale: Hurts little more Pain Location: RUE Pain Descriptors / Indicators: Pressure, Discomfort, Aching Pain Intervention(s): Monitored during session, Repositioned    Home Living Family/patient  expects to be discharged to:: Private residence Living Arrangements: Spouse/significant other Available Help at Discharge: Family;Available 24 hours/day Type of Home: House Home Access: Stairs to enter Entrance Stairs-Rails: None Entrance Stairs-Number of Steps: 1   Home Layout: One level Home Equipment: Hand held shower head;Grab bars - tub/shower;Shower seat      Prior Function Prior Level of Function : Independent/Modified Independent;Driving             Mobility Comments: Ambulates without AD. Denies fall hx. ADLs Comments: Indep with ADLs/IADLs.     Extremity/Trunk Assessment   Upper Extremity Assessment Upper Extremity Assessment: Defer to OT evaluation    Lower Extremity Assessment Lower Extremity Assessment: Overall WFL for tasks assessed    Cervical / Trunk Assessment Cervical / Trunk Assessment: Normal  Communication   Communication Communication: Impaired Factors Affecting Communication: Hearing impaired    Cognition Arousal: Alert Behavior During Therapy: WFL for tasks assessed/performed   PT - Cognitive impairments: No apparent impairments                       PT - Cognition Comments: Pt A,Ox4 Following commands: Intact       Cueing Cueing Techniques: Verbal cues     General Comments General comments (skin integrity, edema, etc.): VSS on RA. RUE edema.    Exercises     Assessment/Plan    PT Assessment Patient does not need any further PT services  PT Problem List         PT Treatment Interventions      PT Goals (Current goals can be found in the Care Plan section)  Acute Rehab PT Goals Patient Stated Goal: Return Home    Frequency       Co-evaluation               AM-PAC PT 6 Clicks Mobility  Outcome Measure Help needed turning from your back to your side while in a flat bed without using bedrails?: None Help needed moving from lying on your back to sitting on the side of a flat bed without using  bedrails?: None Help needed moving to and from a bed to a chair (including a wheelchair)?: A Little Help needed standing up from a chair using your arms (e.g., wheelchair or bedside chair)?: A Little Help needed to walk in hospital room?: A Little Help needed climbing 3-5 steps with a railing? : A Little 6 Click Score: 20    End of Session Equipment Utilized During Treatment: Gait belt Activity Tolerance: Patient tolerated treatment well Patient left: in chair;with call bell/phone within reach;with chair alarm set Nurse Communication: Mobility status PT Visit Diagnosis: Other abnormalities of gait and mobility (R26.89)    Time: 8753-8694 PT Time Calculation (min) (ACUTE ONLY): 19 min   Charges:   PT Evaluation $PT Eval Low Complexity: 1 Low PT Treatments $Gait Training: 8-22 mins PT General Charges $$ ACUTE PT VISIT: 1 Visit         Randall SAUNDERS, PT, DPT Acute Rehabilitation Services Office: [phone] Secure Chat Preferred  Keith Jordan 10/27/2024, 1:40 PM

## 2024-10-27 NOTE — Evaluation (Signed)
 Occupational Therapy Evaluation Patient Details Name: Keith Jordan MRN: [id number] DOB: 08-11-1941 Today's Date: 10/27/2024   History of Present Illness   Keith Jordan is a 83 y.o. male who presented to [facility] 10/26/24 referred to by his PCP d/t RUE swelling, warmth, tenderness, and redness. CT shows evidence of hematoma of the flexor musculature 5.6 x 5 x 15 cm with near occlusive stenosis of the ulnar artery and radial artery. 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     Clinical Impressions Pt is typically independent. Continues to mobilize independent in his room and to bathroom without AD. He needs up to min assist for ADLs and has a wife who can assist at home. Educated pt in compensatory strategies for ADLs, issued foam build ups for self feeding and toothbrushing with R hand. Instructed in use of extra large surgical gloves for bathing and dressing, should pt prefer to use his R hand, to protect ace wrap. Educated in edema management and elevated R UE above heart. Pt verbalized understanding. Will follow acutely. Do not anticipate pt will need post acute OT. Likely to regain use of R UE as swelling resolves.      If plan is discharge home, recommend the following:   Assistance with cooking/housework;A little help with bathing/dressing/bathroom     Functional Status Assessment   Patient has had a recent decline in their functional status and demonstrates the ability to make significant improvements in function in a reasonable and predictable amount of time.     Equipment Recommendations   None recommended by OT     Recommendations for Other Services         Precautions/Restrictions   Restrictions Weight Bearing Restrictions Per Provider Order: Yes RUE Weight Bearing Per Provider Order: Weight bearing as tolerated     Mobility Bed Mobility Overal bed  mobility: Independent                  Transfers Overall transfer level: Independent Equipment used: None                      Balance                                           ADL either performed or assessed with clinical judgement   ADL Overall ADL's : Needs assistance/impaired Eating/Feeding: Minimal assistance;Sitting Eating/Feeding Details (indicate cue type and reason): assist to cut food, open containers Grooming: Wash/dry hands;Standing;Modified independent   Upper Body Bathing: Minimal assistance;Sitting   Lower Body Bathing: Modified independent;Sit to/from stand   Upper Body Dressing : Set up;Sitting Upper Body Dressing Details (indicate cue type and reason): educated to dress R UE first, consider front opening shirts to avoid buttons Lower Body Dressing: Modified independent;Sit to/from stand Lower Body Dressing Details (indicate cue type and reason): recommended elastic waist pants and slip on shoe Toilet Transfer: Independent   Toileting- Clothing Manipulation and Hygiene: Modified independent;Sit to/from stand Toileting - Clothing Manipulation Details (indicate cue type and reason): provided extra large gloves if pt chooses to use R hand, recommended wet wipes     Functional mobility during ADLs: Independent       Vision Ability to See in Adequate Light: 0 Adequate Patient Visual Report: No change from baseline  Perception         Praxis         Pertinent Vitals/Pain Pain Assessment Pain Assessment: No/denies pain     Extremity/Trunk Assessment Upper Extremity Assessment Upper Extremity Assessment: Right hand dominant;RUE deficits/detail RUE Deficits / Details: edematous, ace wrapped, full shoulder ROM and elbow within confines of ace wrap, limited grasp due to edema RUE Coordination: decreased fine motor   Lower Extremity Assessment Lower Extremity Assessment: Defer to PT evaluation   Cervical /  Trunk Assessment Cervical / Trunk Assessment: Normal   Communication Communication Communication: Impaired Factors Affecting Communication: Hearing impaired   Cognition Arousal: Alert Behavior During Therapy: WFL for tasks assessed/performed Cognition: No apparent impairments                               Following commands: Intact       Cueing  General Comments   Cueing Techniques: Verbal cues      Exercises     Shoulder Instructions      Home Living Family/patient expects to be discharged to:: Private residence Living Arrangements: Spouse/significant other Available Help at Discharge: Family;Available 24 hours/day Type of Home: House Home Access: Stairs to enter [employer] of Steps: 1 Entrance Stairs-Rails: None Home Layout: One level     Bathroom Shower/Tub: Producer, Television/film/video: Standard     Home Equipment: Hand held shower head;Grab bars - tub/shower;Shower seat          Prior Functioning/Environment Prior Level of Function : Independent/Modified Independent;Driving                    OT Problem List: Impaired UE functional use   OT Treatment/Interventions: Self-care/ADL training;Therapeutic activities      OT Goals(Current goals can be found in the care plan section)   Acute Rehab OT Goals OT Goal Formulation: With patient Time For Goal Achievement: 11/10/24 Potential to Achieve Goals: Good ADL Goals Additional ADL Goal #1: Pt will complete basic ADLs using compensatory strategies as instructed. Additional ADL Goal #2: Pt will demonstrate knowledge of edema management strategies.   OT Frequency:  Min 2X/week    Co-evaluation              AM-PAC OT 6 Clicks Daily Activity     Outcome Measure Help from another person eating meals?: A Little Help from another person taking care of personal grooming?: A Little Help from another person toileting, which includes using toliet, bedpan, or  urinal?: None Help from another person bathing (including washing, rinsing, drying)?: A Little Help from another person to put on and taking off regular upper body clothing?: None Help from another person to put on and taking off regular lower body clothing?: None 6 Click Score: 21   End of Session    Activity Tolerance: Patient tolerated treatment well Patient left: in bed;with call bell/phone within reach  OT Visit Diagnosis: Other (comment) (edema)                Time: 8454-8394 OT Time Calculation (min): 20 min Charges:  OT General Charges $OT Visit: 1 Visit OT Evaluation $OT Eval Low Complexity: 1 Low  Mliss HERO, OTR/L Acute Rehabilitation Services Office: [phone]   Kennth Mliss Helling 10/27/2024, 4:32 PM

## 2024-10-27 NOTE — Consult Note (Signed)
 Reason for Consult:Right arm hematoma Referring Physician: Chapman Dahal Time called: 1021 Time at bedside: 1129   Keith Jordan is an 83 y.o. male.  HPI: Keith Jordan developed spontaneous swelling and pain in his right arm on Saturday. It gradually worsened and he presented to the ED where he was diagnosed with a hematoma and admitted. Hand surgery was consulted. He had a previous experience of spontaneous hematoma in his left leg about 2y ago. He is RHD.  Past Medical History:  Diagnosis Date   Abnormal breath sounds 10/11/2020   Acute sinusitis 01/29/2021   Advanced care planning/counseling discussion 09/01/2016   BPH (benign prostatic hyperplasia) 09/29/2018   Ear fullness, bilateral 04/03/2021   Health maintenance examination 09/01/2016   Hyperlipidemia    Irregular heart beat 03/24/2021   Ischemic optic neuropathy 2006   [place] at [facility]   Leukocytosis 01/30/2021   Severe s/p ER eval 01/2021   Medicare annual wellness visit, subsequent 10/04/2019   Nausea without vomiting 03/24/2021   Personal history of colonic adenoma 07/30/2008   Prediabetes 07/05/2016   A1c 6.5% 03/2014    Prediabetes 07/05/2016   A1c 6.5% 03/2014    Primary osteoarthritis of knee    bilateral s/p L knee replacement, receives R knee injections libbie, [name])   Sebaceous cyst 01/30/2020   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    Past Surgical History:  Procedure Laterality Date   A-FLUTTER ABLATION N/A 07/11/2021   Procedure: A-FLUTTER ABLATION;  Surgeon: Inocencio Soyla Lunger, MD;  Location: [facility];  Service: Cardiovascular;  Laterality: N/A;   COLONOSCOPY  12/2013   no polyps, no rpt due [place])   CYST EXCISION  01/2020   TOTAL KNEE ARTHROPLASTY Left 2005   [name]    Family History  Problem Relation Age of Onset   Blindness Mother 43       ?stroke in eyes   Lung disease Father        coal miner   Arthritis Father    Colon cancer Neg Hx    Pancreatic cancer Neg Hx    Stomach cancer  Neg Hx    Esophageal cancer Neg Hx    Rectal cancer Neg Hx    Cancer Neg Hx    Diabetes Neg Hx    CAD Neg Hx    Stroke Neg Hx     Social History:  reports that he has never smoked. He has never used smokeless tobacco. He reports current alcohol use. He reports that he does not use drugs.  Allergies: No Known Allergies  Medications: I have reviewed the patient's current medications.  Results for orders placed or performed during the hospital encounter of 10/26/24 (from the past 48 hours)  Blood culture (routine x 2)     Status: None (Preliminary result)   Collection Time: 10/26/24  9:49 AM   Specimen: BLOOD  Result Value Ref Range   Specimen Description      BLOOD BLOOD LEFT FOREARM Performed at [facility], [address] [place], [place] [address]    Special Requests      BOTTLES DRAWN AEROBIC AND ANAEROBIC Blood Culture adequate volume Performed at [facility], [address] [place], [place] [address]    Culture      NO GROWTH < 24 HOURS Performed at [facility], [address] [place], [place] [address]    Report Status PENDING   CBC with Differential     Status: Abnormal  Collection Time: 10/26/24  9:51 AM  Result Value Ref Range   WBC 219.0 (HH) 4.0 - 10.5 K/uL    Comment: This critical result has been called to [employer] by [employer] on 10/26/2024 10:51:22, and has been read back.   RBC 3.31 (L) 4.22 - 5.81 MIL/uL   Hemoglobin 10.0 (L) 13.0 - 17.0 g/dL   HCT 67.7 (L) 60.9 - 47.9 %   MCV 97.3 80.0 - 100.0 fL   MCH 30.2 26.0 - 34.0 pg   MCHC 31.1 30.0 - 36.0 g/dL   RDW 84.3 (H) 88.4 - 84.4 %   Platelets 110 (L) 150 - 400 K/uL   nRBC 0.0 0.0 - 0.2 %   Neutrophils Relative % 5 %   Neutro Abs 11.0 (H) 1.7 - 7.7 K/uL   Lymphocytes Relative 89 %   Lymphs Abs 194.9 (H) 0.7 - 4.0 K/uL   Monocytes Relative 6 %   Monocytes Absolute 13.1 (H) 0.1 - 1.0 K/uL   Eosinophils Relative 0 %   Eosinophils  Absolute 0.0 0.0 - 0.5 K/uL   Basophils Relative 0 %   Basophils Absolute 0.0 0.0 - 0.1 K/uL   WBC Morphology See Note     Comment: Abnormal lymphocytes present   Smear Review Normal platelet morphology    Polychromasia PRESENT     Comment: Performed at [facility], [address] [place], [place] [address]  Comprehensive metabolic panel     Status: Abnormal   Collection Time: 10/26/24  9:51 AM  Result Value Ref Range   Sodium 141 135 - 145 mmol/L   Potassium 3.7 3.5 - 5.1 mmol/L   Chloride 102 98 - 111 mmol/L   CO2 28 22 - 32 mmol/L   Glucose, Bld 128 (H) 70 - 99 mg/dL    Comment: Glucose reference range applies only to samples taken after fasting for at least 8 hours.   BUN 30 (H) 8 - 23 mg/dL   Creatinine, Ser 9.10 0.61 - 1.24 mg/dL   Calcium  9.8 8.9 - 10.3 mg/dL   Total Protein 7.0 6.5 - 8.1 g/dL   Albumin 4.0 3.5 - 5.0 g/dL   AST 61 (H) 15 - 41 U/L   ALT 13 0 - 44 U/L   Alkaline Phosphatase 94 38 - 126 U/L   Total Bilirubin 0.6 0.0 - 1.2 mg/dL   GFR, Estimated >39 >39 mL/min    Comment: (NOTE) Calculated using the CKD-EPI Creatinine Equation (2021)    Anion gap 11 5 - 15    Comment: Performed at [facility], [address] [place], [place] [address]  Lactic acid, plasma     Status: None   Collection Time: 10/26/24  9:51 AM  Result Value Ref Range   Lactic Acid, Venous 1.0 0.5 - 1.9 mmol/L    Comment: Performed at [facility], [address] [place], [place] [address]  Protime-INR     Status: Abnormal   Collection Time: 10/26/24  9:51 AM  Result Value Ref Range   Prothrombin Time 22.9 (H) 11.4 - 15.2 seconds   INR 1.9 (H) 0.8 - 1.2    Comment: (NOTE) INR goal varies based on device and disease states. Performed at [facility], [address], [place], [place] [address]   APTT     Status: None   Collection Time: 10/26/24  9:51 AM  Result Value Ref Range   aPTT 32 24 - 36 seconds     Comment: Performed  at [facility], [address] [place], [place] [address]  Blood culture (routine x 2)     Status: None (Preliminary result)   Collection Time: 10/26/24 10:04 AM   Specimen: BLOOD LEFT ARM  Result Value Ref Range   Specimen Description      BLOOD LEFT ARM Performed at [facility], [address] [place], [place] [address]    Special Requests      BOTTLES DRAWN AEROBIC AND ANAEROBIC Blood Culture adequate volume Performed at [facility], [address] [place], [place] [address]    Culture      NO GROWTH < 24 HOURS Performed at [facility], [address], [place], [place] [address]    Report Status PENDING   Lactic acid, plasma     Status: None   Collection Time: 10/26/24 11:35 AM  Result Value Ref Range   Lactic Acid, Venous 1.1 0.5 - 1.9 mmol/L    Comment: Performed at [facility], [address] [place], [place] [address]  MRSA Next Gen by PCR, Nasal     Status: None   Collection Time: 10/26/24 11:53 PM   Specimen: Nasal Mucosa; Nasal Swab  Result Value Ref Range   MRSA by PCR Next Gen NOT DETECTED NOT DETECTED    Comment: (NOTE) The GeneXpert MRSA Assay (FDA approved for NASAL specimens only), is one component of a comprehensive MRSA colonization surveillance program. It is not intended to diagnose MRSA infection nor to guide or monitor treatment for MRSA infections. Test performance is not FDA approved in patients less than 77 years old. Performed at [facility], [address], [place], [place] [address]     CT ANGIO UP EXTREM RIGHT W &/OR WO CONTRAST Result Date: 10/26/2024 EXAM: CTA RIGHT UPPER EXTREMITY 10/26/2024 11:16:21 AM TECHNIQUE: Contrast-enhanced computed tomography angiography of the upper extremity was performed with multiplanar reconstructions. Without and with IV contrast, including 100 mL of iohexol  (OMNIPAQUE ) 350 MG/ML injection, was  administered. Automated exposure control, iterative reconstruction, and/or weight based adjustment of the mA/kV was utilized to reduce the radiation dose to as low as reasonably achievable. COMPARISON: None available. CLINICAL HISTORY: reeval forearm lesion seen on ultrasound, ultrasound had concern for possible pseudoaneurysm FINDINGS: ARTERIAL: The axillary and brachial arteries are patent with no stenosis, occlusion, aneurysm or dissection. The mass causes near occlusive stenosis of the ulnar artery throughout its course. There is also near occlusive stenosis of the radial artery proximally. Interosseous arteries are also markedly compressed by the mass. No abnormal arterial enhancement pattern is observed. There is no active hemorrhage or arteriovenous shunting demonstrated. VENOUS: The axillary, brachial, cephalic, basilic and median cubital veins are patent with no thrombosis. The superficial veins are patent and opacified. BONES AND SOFT TISSUES: There is ill-defined expansion of the flexor musculature present within the proximal and mid forearm with suggestion of an infiltrating hyperdense mass or fluid collection measuring approximately 5.6 x 5.0 x 15 cm. There is no apparent active hemorrhage within the presumed hematoma. The findings appear to represent a hematoma and not a pseudoaneurysm. There is moderate subcutaneous soft tissue edema throughout the right upper extremity. No significant osseous abnormalities seen within the field of view. IMPRESSION: 1. Ill-defined expansion of the flexor musculature in the proximal and mid right forearm with an infiltrating hyperdense mass/fluid collection measuring approximately 5.6 x 5.0 x 15 cm, consistent with a hematoma; no active hemorrhage or arteriovenous shunting identified. 2. Near-occlusive stenosis of  the ulnar artery throughout its course and proximal near-occlusive stenosis of the radial artery, with marked compression of the interosseous arteries by the  mass. 3. Moderate subcutaneous soft tissue edema throughout the right upper extremity. Electronically signed by: Evalene Coho MD 10/26/2024 12:14 PM EDT RP Workstation: [id number]   DG Forearm Right Result Date: 10/26/2024 CLINICAL DATA:  Right arm pain 5 days.  Cellulitis. EXAM: RIGHT FOREARM - 2 VIEW COMPARISON:  None Available. FINDINGS: Exam demonstrates degenerative changes over the radiocarpal joint, carpal bones and hand. Mild irregularity over the distal radial metaphysis likely chronic. Right elbow is unremarkable. No acute fracture or dislocation. IMPRESSION: 1. No acute findings. 2. Degenerative changes of the wrist and hand. Electronically Signed   By: Toribio Agreste M.D.   On: 10/26/2024 10:32   DG Humerus Right Result Date: 10/26/2024 CLINICAL DATA:  Possible right arm cellulitis. EXAM: RIGHT HUMERUS - 2+ VIEW COMPARISON:  None Available. FINDINGS: The right humerus is normal without evidence of fracture, dislocation or focal lytic/sclerotic lesion. No air within the soft tissues. Remainder of the exam is unremarkable. IMPRESSION: No acute findings. Electronically Signed   By: Toribio Agreste M.D.   On: 10/26/2024 10:30    Review of Systems  HENT:  Negative for ear discharge, ear pain, hearing loss and tinnitus.   Eyes:  Negative for photophobia and pain.  Respiratory:  Negative for cough and shortness of breath.   Cardiovascular:  Negative for chest pain.  Gastrointestinal:  Negative for abdominal pain, nausea and vomiting.  Genitourinary:  Negative for dysuria, flank pain, frequency and urgency.  Musculoskeletal:  Positive for arthralgias (Right arm). Negative for back pain, myalgias and neck pain.  Neurological:  Negative for dizziness and headaches.  Hematological:  Does not bruise/bleed easily.  Psychiatric/Behavioral:  The patient is not nervous/anxious.    Blood pressure (!) 117/57, pulse 72, temperature (!) 97.5 F (36.4 C), temperature source Oral, resp. rate 16,  weight 61.1 kg, SpO2 95%. Physical Exam Constitutional:      General: He is not in acute distress.    Appearance: He is well-developed. He is not diaphoretic.  HENT:     Head: Normocephalic and atraumatic.  Eyes:     General: No scleral icterus.       Right eye: No discharge.        Left eye: No discharge.     Conjunctiva/sclera: Conjunctivae normal.  Cardiovascular:     Rate and Rhythm: Normal rate and regular rhythm.  Pulmonary:     Effort: Pulmonary effort is normal. No respiratory distress.  Musculoskeletal:     Cervical back: Normal range of motion.     Comments: Right shoulder, elbow, wrist, digits- no skin wounds, mild TTP FA, no instability, no blocks to motion  Sens  Ax/R/M/U intact  Mot   Ax/ R/ PIN/ M/ AIN/ U intact but limited 2/2 pain  Rad 2+  Skin:    General: Skin is warm and dry.  Neurological:     Mental Status: He is alert.  Psychiatric:        Mood and Affect: Mood normal.        Behavior: Behavior normal.     Assessment/Plan: Right arm hematoma -- Agree with compression. As skin does not appear threatened advise non-surgical management with compression and time. He may WBAT RUE.    Ozell DOROTHA Ned, PA-C Orthopedic Surgery [phone] 10/27/2024, 12:01 PM

## 2024-10-27 NOTE — Progress Notes (Addendum)
 PROGRESS NOTE  Keith Jordan  DOB: 1941-07-06  PCP: Rilla Baller, MD FMW:[id number]  DOA: 10/26/2024  LOS: 1 day  Hospital Day: 2  Subjective: Patient was seen and examined this morning. Lying down in bed.  Not in distress. Right upper extremity exam did not see any evidence of cellulitis.  Warm.  Palpable pulse. Chart reviewed. Afebrile, blood pressure in 140s to 150s, breathing on room air   Brief narrative: Keith Jordan is a 83 y.o. 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  Seen by PCP on 10/28.  With suspicion of cellulitis, started on a course of doxycycline  and cephalexin .  Also plan for ultrasound duplex to rule out DVT.  Symptoms worsened and hence patient was sent to ED at [facility] on 10/30.  In the ED, afebrile, hemodynamically stable Initial labs with WBC significantly elevated to 219,000, hemoglobin 10, platelet 110 Lactic acid normal, renal function normal Blood culture obtained X-ray right forearm and right humerus did not show any acute findings CT angio right upper extremity with contrast showed - Ill-defined expansion of the flexor musculature in the proximal and mid right forearm with an infiltrating hyperdense mass/fluid collection measuring approximately 5.6 x 5.0 x 15 cm, consistent with a hematoma; no active hemorrhage or arteriovenous shunting identified. -Near-occlusive stenosis of the ulnar artery throughout its course and proximal near-occlusive stenosis of the radial artery, with marked compression of the interosseous arteries by the mass. -Moderate subcutaneous soft tissue edema throughout the right upper extremity.  Patient was started on a course of IV Rocephin and IV vancomycin EDP discussed  the case with vascular surgery who recommended hand surgery as well Admitted to [facility] at [facility]  Assessment and plan: Severe spontaneous RUE hematoma POA Presented with worsening swelling, redness and warmth of right upper extremity in the setting of chronic anticoagulation  Initially suspected of cellulitis but no fever, WBC count unreliable in the setting of lymphoma Imagings did not show cellulitis but showed right forearm hematoma compressing radial artery 10/30, seen by vascular surgeon Dr. Magda.  On his exam, vascular exam is reassuring with a strong right radial pulse and no role of intervention.  At his recommendation, EDP also discussed with hand surgeon Dr. Delene.  Per EDP note, he did not feel that there is anything for hand surgery to offer.  Recommended to consider Ace bandage for swelling.  Ordered. Eliquis  remains on hold at this time.  Last dose on the morning of 10/26/2024 Pain regimen --- Scheduled: Tylenol  1 g 3 times daily --- PRN: IV Dilaudid, IV oxycodone    Paroxysmal A-flutter On carvedilol  6.25 mg twice daily Continue telemetry monitoring  Eliquis  plan as above,    Chronic HFpEF HTN Euvolemic.  Blood pressure controlled Last 2D echo done on 05/21/2021 revealed LVEF 70 to 75% with grade 1 diastolic dysfunction. PTA meds- carvedilol  6.25 mg daily Resume the same   Splenic marginal zone B-cell lymphoma  Chronic severe leukocytosis Thrombocytopenia, mild anemia S/p rituximab  therapy  WBC count was significantly elevated to 219,000 at presentation which is at his baseline.  His most recent WBC count prior to this was 273 two weeks ago. follows with hematology/oncology Dr. Federico outpatient. Recent Labs  Lab 10/26/24 0951  WBC 219.0*  NEUTROABS 11.0*  HGB 10.0*  HCT 32.2*  MCV 97.3  PLT 110*    HLD Continue Lipitor   Generalized weakness PT OT eval requested Fall precautions.  Osteoarthritis s/p left knee placement As needed pain  meds   Mobility:  PT Orders: Active   PT Follow up Rec:    Goals of care   Code Status: Full Code     DVT prophylaxis:  SCDs Start: 10/26/24 2241   Antimicrobials: No evidence of cellulitis.  Can stop antibiotics IV Fluid: Can stop LR Consultants: Vascular surgery.  Hand surgery on the phone by ER Family Communication: None at bedside  Status: Observation Level of care:  Telemetry   Patient is from: Home Needs to continue in-hospital care: Compression wrapping of right upper extremity started.  Needs inpatient monitoring Anticipated d/c to: If RUE swelling improves by tomorrow, plan to discharge home tomorrow   Diet:  Diet Order             Diet regular Room service appropriate? Yes; Fluid consistency: Thin  Diet effective now                   Scheduled Meds:  acetaminophen   1,000 mg Oral TID   atorvastatin   10 mg Oral Daily   carvedilol   6.25 mg Oral BID    PRN meds: HYDROmorphone (DILAUDID) injection, melatonin, oxyCODONE , polyethylene glycol, prochlorperazine   Infusions:     Antimicrobials: Anti-infectives (From admission, onward)    Start     Dose/Rate Route Frequency Ordered Stop   10/27/24 2200  vancomycin (VANCOREADY) IVPB 1250 mg/250 mL  Status:  Discontinued        1,250 mg 166.7 mL/hr over 90 Minutes Intravenous Every 24 hours 10/27/24 0350 10/27/24 1105   10/26/24 2345  vancomycin (VANCOREADY) IVPB 1250 mg/250 mL        1,250 mg 166.7 mL/hr over 90 Minutes Intravenous  Once 10/26/24 2337 10/27/24 0148   10/26/24 2330  cefTRIAXone (ROCEPHIN) 2 g in sodium chloride  0.9 % 100 mL IVPB  Status:  Discontinued        2 g 200 mL/hr over 30 Minutes Intravenous Every 24 hours 10/26/24 2244 10/27/24 1105       Objective: Vitals:   10/27/24 0743 10/27/24 1204  BP: (!) 117/57 (!) 143/59  Pulse: 72 66  Resp: 16   Temp: (!) 97.5 F (36.4 C)   SpO2: 95% 96%    Intake/Output Summary (Last 24 hours) at 10/27/2024 1332 Last data filed at  10/27/2024 1053 Gross per 24 hour  Intake 598.45 ml  Output 150 ml  Net 448.45 ml   Filed Weights   10/27/24 0623  Weight: 61.1 kg   Weight change:  Body mass index is 19.89 kg/m.   Physical Exam: General exam: Pleasant, elderly Caucasian male.  In mild distress from swelling of RUE Skin: No rashes, lesions or ulcers. HEENT: Atraumatic, normocephalic, no obvious bleeding Lungs: Clear to auscultation bilaterally,  CVS: S1, S2, no murmur,   GI/Abd: Soft, nontender, nondistended, bowel sound present,   CNS: Alert, awake, oriented x 3 Psychiatry: Mood appropriate Extremities: No pedal edema, no calf tenderness, Swelling is all because of hematoma and impaired venous drainage  Data Review: I have personally reviewed the laboratory data and studies available.  F/u labs ordered Unresulted Labs (From admission, onward)    None       Signed, Chapman Rota, MD [facility] 10/27/2024

## 2024-10-27 NOTE — Care Management Obs Status (Signed)
 MEDICARE OBSERVATION STATUS NOTIFICATION   Patient Details  Name: Keith Jordan MRN: [id number] Date of Birth: [birth year]   Medicare Observation Status Notification Given:  Yes    [name], RN 10/27/2024, 2:03 PM

## 2024-10-27 NOTE — Care Management CC44 (Signed)
 Condition Code 44 Documentation Completed  Patient Details  Name: Keith Jordan MRN: [id number] Date of Birth: 10-10-1941   Condition Code 44 given:  Yes Patient signature on Condition Code 44 notice:  Yes Documentation of 2 MD's agreement:  Yes Code 44 added to claim:  Yes    [name], RN 10/27/2024, 2:03 PM

## 2024-10-27 NOTE — Care Management (Signed)
  Transition of Care [facility]) Screening Note   Patient Details  Name: Keith Jordan Date of Birth: 1941-01-19   Transition of Care [facility]) CM/SW Contact:    [name], RN Phone Number: 10/27/2024, 2:04 PM    Transition of Care Department [facility]) has reviewed patient and no TOC needs have been identified at this time. We will continue to monitor patient advancement through interdisciplinary progression rounds. If new patient transition needs arise, please place a TOC consult.

## 2024-10-27 NOTE — Progress Notes (Signed)
Noted. Will follow hospital course.

## 2024-10-27 NOTE — Progress Notes (Signed)
  Progress Note    10/27/2024 8:18 AM * No surgery found *  Subjective:  sleeping, no major complaints. Hopeful to eat something soon   Vitals:   10/27/24 0425 10/27/24 0743  BP: (!) 140/60 (!) 117/57  Pulse: 76 72  Resp: 17 16  Temp: 98.6 F (37 C) (!) 97.5 F (36.4 C)  SpO2: 93% 95%   Physical Exam: Cardiac:  regular Lungs:  non labored Extremities:  Right forearm and distal upper arm with large hematoma. Edema present with ecchymosis. Expected tenderness. Overall soft. Able to wiggle fingers but some decreased ROM. Sensation is intact. Palpable radial, ulnar and brachial pulses. Hand is warm Neurologic: alert and oriented   CBC    Component Value Date/Time   WBC 219.0 (HH) 10/26/2024 0951   RBC 3.31 (L) 10/26/2024 0951   HGB 10.0 (L) 10/26/2024 0951   HGB 10.6 (L) 10/02/2024 1316   HGB 11.5 (L) 05/01/2023 1205   HCT 32.2 (L) 10/26/2024 0951   HCT 35.6 (L) 05/01/2023 1205   PLT 110 (L) 10/26/2024 0951   PLT 119 (L) 10/02/2024 1316   PLT 100 (LL) 05/01/2023 1205   MCV 97.3 10/26/2024 0951   MCV 97 05/01/2023 1205   MCH 30.2 10/26/2024 0951   MCHC 31.1 10/26/2024 0951   RDW 15.6 (H) 10/26/2024 0951   RDW 13.0 05/01/2023 1205   LYMPHSABS 194.9 (H) 10/26/2024 0951   LYMPHSABS 121.6 (H) 02/03/2022 1146   MONOABS 13.1 (H) 10/26/2024 0951   EOSABS 0.0 10/26/2024 0951   EOSABS 0.5 (H) 02/03/2022 1146   BASOSABS 0.0 10/26/2024 0951   BASOSABS 0.1 02/03/2022 1146    BMET    Component Value Date/Time   NA 141 10/26/2024 0951   NA 142 02/03/2022 1146   NA 139 04/16/2015 0000   NA 139 04/16/2015 0000   K 3.7 10/26/2024 0951   K 4.5 04/16/2015 0000   K 4.5 04/16/2015 0000   CL 102 10/26/2024 0951   CO2 28 10/26/2024 0951   GLUCOSE 128 (H) 10/26/2024 0951   BUN 30 (H) 10/26/2024 0951   BUN 27 02/03/2022 1146   CREATININE 0.89 10/26/2024 0951   CREATININE 1.17 10/02/2024 1316   CREATININE 1.10 04/16/2015 0000   CREATININE 1.10 04/16/2015 0000   CALCIUM  9.8  10/26/2024 0951   CALCIUM  9.0 04/16/2015 0000   GFRNONAA >60 10/26/2024 0951   GFRNONAA >60 10/02/2024 1316   GFRAA  02/23/2008 0820    >60        The eGFR has been calculated using the MDRD equation. This calculation has not been validated in all clinical    INR    Component Value Date/Time   INR 1.9 (H) 10/26/2024 0951     Intake/Output Summary (Last 24 hours) at 10/27/2024 0818 Last data filed at 10/27/2024 0402 Gross per 24 hour  Intake 598.45 ml  Output --  Net 598.45 ml     Assessment/Plan:  83 y.o. male with large right forearm hematoma. No vascular compromise at this time. Adequate pulses at risk. No indication for any intervention from our standpoint. We will be available as needed   Teretha Damme, PA-C [facility] [phone] 10/27/2024 8:18 AM

## 2024-10-27 NOTE — Progress Notes (Signed)
 Pharmacy Antibiotic Note  Keith Jordan is a 83 y.o. male admitted on 10/26/2024 with cellulitis.  Pharmacy has been consulted for Vancomycin dosing. WBC is elevated due to lymphoma. Renal function ok.   Plan: Vancomycin 1250 mg IV q24h >>>Estimated AUC: 475 Ceftriaxone per MD Trend WBC, temp, renal function  F/U infectious work-up Drug levels as indicated   Temp (24hrs), Avg:98.7 F (37.1 C), Min:98.2 F (36.8 C), Max:99.2 F (37.3 C)  Recent Labs  Lab 10/25/24 0806 10/26/24 0951 10/26/24 1135  WBC  --  219.0*  --   CREATININE 0.97 0.89  --   LATICACIDVEN  --  1.0 1.1    Estimated Creatinine Clearance: 57.5 mL/min (by C-G formula based on SCr of 0.89 mg/dL).    No Known Allergies  Lynwood Mckusick, PharmD, BCPS Clinical Pharmacist Phone: [phone]

## 2024-10-28 DIAGNOSIS — S40021A Contusion of right upper arm, initial encounter: Secondary | ICD-10-CM | POA: Diagnosis not present

## 2024-10-28 LAB — FRUCTOSAMINE: Fructosamine: 268 umol/L (ref 205–285)

## 2024-10-28 MED ORDER — ACETAMINOPHEN 500 MG PO TABS: 1000.0000 mg | ORAL_TABLET | Freq: Three times a day (TID) | ORAL | Status: DC

## 2024-10-28 NOTE — Discharge Summary (Signed)
 Physician Discharge Summary  Keith Jordan FMW:[id number] DOB: [birth year] DOA: 10/26/2024  PCP: Rilla Baller, MD  Admit date: 10/26/2024 Discharge date: 10/28/2024  Admitted from: Home Discharge disposition: Home  Recommendations at discharge:  Tylenol  1 g 3 times daily scheduled for pain control Eliquis  has been held till hematoma resolves. Follow-up with PCP for symptom monitoring and discussion on reinitiation of Eliquis  Continue to follow-up with oncology as an outpatient.   Subjective: Patient was seen and examined this morning. Lying on bed.  Has compression wrapping on right upper extremity throughout. Feels better than at presentation.  Feels ready to go home.  Lives at home with his wife. Hemodynamically stable, breathing on room air   Brief narrative: Keith Jordan is a 83 y.o. 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  Seen by PCP on 10/28.  With suspicion of cellulitis, started on a course of doxycycline  and cephalexin .  Also plan for ultrasound duplex to rule out DVT.  Symptoms worsened and hence patient was sent to ED at [facility] on 10/30.  In the ED, afebrile, hemodynamically stable Initial labs with WBC significantly elevated to 219,000, hemoglobin 10, platelet 110 Lactic acid normal, renal function normal Blood culture obtained X-ray right forearm and right humerus did not show any acute findings CT angio right upper extremity with contrast showed - Ill-defined expansion of the flexor musculature in the proximal and mid right forearm with an infiltrating hyperdense mass/fluid collection measuring approximately 5.6 x 5.0 x 15 cm, consistent with a hematoma; no active hemorrhage or arteriovenous shunting  identified. -Near-occlusive stenosis of the ulnar artery throughout its course and proximal near-occlusive stenosis of the radial artery, with marked compression of the interosseous arteries by the mass. -Moderate subcutaneous soft tissue edema throughout the right upper extremity.  Patient was started on a course of IV Rocephin and IV vancomycin EDP discussed the case with vascular surgery who recommended hand surgery as well Admitted to [facility] at [facility] course: Severe spontaneous RUE hematoma POA Presented with worsening swelling, redness and warmth of right upper extremity in the setting of chronic anticoagulation  Initially suspected of cellulitis but no fever, WBC count unreliable in the setting of lymphoma Imagings did not show cellulitis but showed right forearm hematoma compressing radial artery. 10/30, seen by vascular surgeon Dr. Magda.  On his exam, vascular exam is reassuring with a strong right radial pulse and no role of intervention.   10/31, seen by orthopedics.  No surgical intervention needed.  Recommended Ace bandage for swelling.  WBAT RUE Eliquis  remains on hold at this time.  Last dose on the morning of 10/26/2024 Pain control currently with Tylenol  scheduled.  Patient wants to avoid opioid products.   Paroxysmal A-flutter On carvedilol  6.25 mg twice daily Eliquis  plan as above   Chronic HFpEF HTN Euvolemic.  Blood pressure controlled Last 2D echo done on 05/21/2021 revealed LVEF 70 to 75% with grade 1 diastolic dysfunction. Continue carvedilol  6.25 mg daily as before   Splenic marginal zone B-cell lymphoma  Chronic severe leukocytosis Thrombocytopenia, mild anemia S/p rituximab  therapy  WBC count was significantly elevated to 219,000 at presentation which is at his baseline.  His most recent WBC count prior to this was 273 two weeks ago. follows with hematology/oncology Dr. Federico outpatient. Recent Labs  Lab 10/26/24 0951  WBC 219.0*   NEUTROABS 11.0*  HGB 10.0*  HCT 32.2*  MCV 97.3  PLT 110*    HLD Continue Lipitor   Generalized weakness Seen by PT OT.  No follow-up recommended  Osteoarthritis s/p left knee placement As needed pain meds  Goals of care   Code Status: Full Code   Diet:  Diet Order             Diet general           Diet regular Room service appropriate? Yes; Fluid consistency: Thin  Diet effective now                   Nutritional status:  Body mass index is 19.89 kg/m.       Wounds:  -    Discharge Medications:   Allergies as of 10/28/2024   No Known Allergies      Medication List     STOP taking these medications    cephALEXin  500 MG capsule Commonly known as: KEFLEX    doxycycline  100 MG tablet Commonly known as: VIBRA -TABS       TAKE these medications    acetaminophen  500 MG tablet Commonly known as: TYLENOL  Take 2 tablets (1,000 mg total) by mouth 3 (three) times daily. What changed:  how much to take when to take this reasons to take this   apixaban  5 MG Tabs tablet Commonly known as: Eliquis  Take 1 tablet (5 mg total) by mouth 2 (two) times daily.   atorvastatin  10 MG tablet Commonly known as: LIPITOR Take 1 tablet (10 mg total) by mouth daily.   carboxymethylcellulose 0.5 % Soln Commonly known as: REFRESH PLUS Place 1 drop into both eyes 2 (two) times daily as needed (dry eyes).   carvedilol  6.25 MG tablet Commonly known as: COREG  TAKE 1 TABLET BY MOUTH TWICE A DAY   L-Theanine 200 MG Caps Take 1 Capful by mouth at bedtime as needed (sleep maintenance insomnia).   melatonin 3 MG Tabs tablet Take 3 mg by mouth at bedtime.   Olopatadine  HCl 0.2 % Soln Apply 1 drop to eye daily. (Both)   multivitamin with minerals tablet Take 1 tablet by mouth daily. Unknown strenght   PreserVision AREDS Caps Take 1 capsule by mouth in the morning and at bedtime.   sodium chloride  0.65 % Soln nasal spray Commonly known as: OCEAN Place 1  spray into both nostrils as needed for congestion.   traMADol HCl 25 MG Tabs Take 25 mg by mouth every 6 (six) hours as needed.   triamcinolone  55 MCG/ACT Aero nasal inhaler Commonly known as: NASACORT  Place 1 spray into the nose daily as needed (sinus congestion).   vitamin C 1000 MG tablet Take 500 mg by mouth 2 (two) times daily.         Follow ups:    Follow-up Information     Rilla Baller, MD Follow up.   Specialty: Family Medicine Contact information: [address] [place] [address] [phone]                 Discharge Instructions:   Discharge Instructions     Call MD for:  difficulty breathing, headache or visual disturbances   Complete by: As directed    Call MD for:  extreme fatigue   Complete by: As directed    Call MD for:  hives   Complete by: As directed    Call MD for:  persistant dizziness  or light-headedness   Complete by: As directed    Call MD for:  persistant nausea and vomiting   Complete by: As directed    Call MD for:  severe uncontrolled pain   Complete by: As directed    Call MD for:  temperature >100.4   Complete by: As directed    Diet general   Complete by: As directed    Discharge instructions   Complete by: As directed    Recommendations at discharge:   Tylenol  1 g 3 times daily scheduled for pain control  Eliquis  has been held till hematoma resolves.  Follow-up with PCP for symptom monitoring and discussion on reinitiation of Eliquis   Continue to follow-up with oncology as an outpatient.  General discharge instructions: Follow with Primary MD Rilla Baller, MD in 7 days  Please request your PCP  to go over your hospital tests, procedures, radiology results at the follow up. Please get your medicines reviewed and adjusted.  Your PCP may decide to repeat certain labs or tests as needed. Do not drive, operate heavy machinery, perform activities at heights, swimming or participation in water  activities or provide baby sitting services if your were admitted for syncope or siezures until you have seen by Primary MD or a Neurologist and advised to do so again. [place]  Controlled Substance Reporting System database was reviewed. Do not drive, operate heavy machinery, perform activities at heights, swim, participate in water activities or provide baby-sitting services while on medications for pain, sleep and mood until your outpatient physician has reevaluated you and advised to do so again.  You are strongly recommended to comply with the dose, frequency and duration of prescribed medications. Activity: As tolerated with Full fall precautions use walker/cane & assistance as needed Avoid using any recreational substances like cigarette, tobacco, alcohol, or non-prescribed drug. If you experience worsening of your admission symptoms, develop shortness of breath, life threatening emergency, suicidal or homicidal thoughts you must seek medical attention immediately by calling 911 or calling your MD immediately  if symptoms less severe. You must read complete instructions/literature along with all the possible adverse reactions/side effects for all the medicines you take and that have been prescribed to you. Take any new medicine only after you have completely understood and accepted all the possible adverse reactions/side effects.  Wear Seat belts while driving. You were cared for by a hospitalist during your hospital stay. If you have any questions about your discharge medications or the care you received while you were in the hospital after you are discharged, you can call the unit and ask to speak with the hospitalist or the covering physician. Once you are discharged, your primary care physician will handle any further medical issues. Please note that NO REFILLS for any discharge medications will be authorized once you are discharged, as it is imperative that you return to your primary care  physician (or establish a relationship with a primary care physician if you do not have one).   Increase activity slowly   Complete by: As directed        Discharge Exam:   Vitals:   10/27/24 1345 10/27/24 2128 10/28/24 0549 10/28/24 0800  BP: 123/60 105/62 (!) 115/50 110/61  Pulse: 69 70 76   Resp: 16 18 17    Temp: 98.3 F (36.8 C) (!) 97.4 F (36.3 C) 99 F (37.2 C) 98.7 F (37.1 C)  TempSrc: Oral Oral Oral Oral  SpO2: 93% 97% 97% 98%  Weight:  Body mass index is 19.89 kg/m.  General exam: Pleasant, elderly Caucasian male.  In mild distress from swelling of RUE Skin: No rashes, lesions or ulcers. HEENT: Atraumatic, normocephalic, no obvious bleeding Lungs: Clear to auscultation bilaterally,  CVS: S1, S2, no murmur,   GI/Abd: Soft, nontender, nondistended, bowel sound present,   CNS: Alert, awake, oriented x 3 Psychiatry: Mood appropriate Extremities: No pedal edema, no calf tenderness. Compression wrap around right upper extremity with improving edema.     The results of significant diagnostics from this hospitalization (including imaging, microbiology, ancillary and laboratory) are listed below for reference.    Procedures and Diagnostic Studies:   CT ANGIO UP EXTREM RIGHT W &/OR WO CONTRAST Result Date: 10/26/2024 EXAM: CTA RIGHT UPPER EXTREMITY 10/26/2024 11:16:21 AM TECHNIQUE: Contrast-enhanced computed tomography angiography of the upper extremity was performed with multiplanar reconstructions. Without and with IV contrast, including 100 mL of iohexol  (OMNIPAQUE ) 350 MG/ML injection, was administered. Automated exposure control, iterative reconstruction, and/or weight based adjustment of the mA/kV was utilized to reduce the radiation dose to as low as reasonably achievable. COMPARISON: None available. CLINICAL HISTORY: reeval forearm lesion seen on ultrasound, ultrasound had concern for possible pseudoaneurysm FINDINGS: ARTERIAL: The axillary and brachial  arteries are patent with no stenosis, occlusion, aneurysm or dissection. The mass causes near occlusive stenosis of the ulnar artery throughout its course. There is also near occlusive stenosis of the radial artery proximally. Interosseous arteries are also markedly compressed by the mass. No abnormal arterial enhancement pattern is observed. There is no active hemorrhage or arteriovenous shunting demonstrated. VENOUS: The axillary, brachial, cephalic, basilic and median cubital veins are patent with no thrombosis. The superficial veins are patent and opacified. BONES AND SOFT TISSUES: There is ill-defined expansion of the flexor musculature present within the proximal and mid forearm with suggestion of an infiltrating hyperdense mass or fluid collection measuring approximately 5.6 x 5.0 x 15 cm. There is no apparent active hemorrhage within the presumed hematoma. The findings appear to represent a hematoma and not a pseudoaneurysm. There is moderate subcutaneous soft tissue edema throughout the right upper extremity. No significant osseous abnormalities seen within the field of view. IMPRESSION: 1. Ill-defined expansion of the flexor musculature in the proximal and mid right forearm with an infiltrating hyperdense mass/fluid collection measuring approximately 5.6 x 5.0 x 15 cm, consistent with a hematoma; no active hemorrhage or arteriovenous shunting identified. 2. Near-occlusive stenosis of the ulnar artery throughout its course and proximal near-occlusive stenosis of the radial artery, with marked compression of the interosseous arteries by the mass. 3. Moderate subcutaneous soft tissue edema throughout the right upper extremity. Electronically signed by: Evalene Coho MD 10/26/2024 12:14 PM EDT RP Workstation: [id number]   DG Forearm Right Result Date: 10/26/2024 CLINICAL DATA:  Right arm pain 5 days.  Cellulitis. EXAM: RIGHT FOREARM - 2 VIEW COMPARISON:  None Available. FINDINGS: Exam demonstrates  degenerative changes over the radiocarpal joint, carpal bones and hand. Mild irregularity over the distal radial metaphysis likely chronic. Right elbow is unremarkable. No acute fracture or dislocation. IMPRESSION: 1. No acute findings. 2. Degenerative changes of the wrist and hand. Electronically Signed   By: Toribio Agreste M.D.   On: 10/26/2024 10:32   DG Humerus Right Result Date: 10/26/2024 CLINICAL DATA:  Possible right arm cellulitis. EXAM: RIGHT HUMERUS - 2+ VIEW COMPARISON:  None Available. FINDINGS: The right humerus is normal without evidence of fracture, dislocation or focal lytic/sclerotic lesion. No air within the soft tissues. Remainder of the  exam is unremarkable. IMPRESSION: No acute findings. Electronically Signed   By: Toribio Agreste M.D.   On: 10/26/2024 10:30     Labs:   Basic Metabolic Panel: Recent Labs  Lab 10/25/24 0806 10/26/24 0951  NA 140 141  K 4.0 3.7  CL 101 102  CO2 30 28  GLUCOSE 148* 128*  BUN 29* 30*  CREATININE 0.97 0.89  CALCIUM  9.1 9.8   GFR Estimated Creatinine Clearance: 55.3 mL/min (by C-G formula based on SCr of 0.89 mg/dL). Liver Function Tests: Recent Labs  Lab 10/25/24 0806 10/26/24 0951  AST 24 61*  ALT 7 13  ALKPHOS 80 94  BILITOT 0.8 0.6  PROT 6.7 7.0  ALBUMIN 4.0 4.0   No results for input(s): LIPASE, AMYLASE in the last 168 hours. No results for input(s): AMMONIA in the last 168 hours. Coagulation profile Recent Labs  Lab 10/26/24 0951  INR 1.9*    CBC: Recent Labs  Lab 10/26/24 0951  WBC 219.0*  NEUTROABS 11.0*  HGB 10.0*  HCT 32.2*  MCV 97.3  PLT 110*   Cardiac Enzymes: No results for input(s): CKTOTAL, CKMB, CKMBINDEX, TROPONINI in the last 168 hours. BNP: Invalid input(s): POCBNP CBG: No results for input(s): GLUCAP in the last 168 hours. D-Dimer No results for input(s): DDIMER in the last 72 hours. Hgb A1c No results for input(s): HGBA1C in the last 72 hours. Lipid Profile No  results for input(s): CHOL, HDL, LDLCALC, TRIG, CHOLHDL, LDLDIRECT in the last 72 hours. Thyroid  function studies No results for input(s): TSH, T4TOTAL, T3FREE, THYROIDAB in the last 72 hours.  Invalid input(s): FREET3 Anemia work up No results for input(s): VITAMINB12, FOLATE, FERRITIN, TIBC, IRON, RETICCTPCT in the last 72 hours. Microbiology Recent Results (from the past 240 hours)  Blood culture (routine x 2)     Status: None (Preliminary result)   Collection Time: 10/26/24  9:49 AM   Specimen: BLOOD  Result Value Ref Range Status   Specimen Description   Final    BLOOD BLOOD LEFT FOREARM Performed at [facility], [address], [place], [place] [address]    Special Requests   Final    BOTTLES DRAWN AEROBIC AND ANAEROBIC Blood Culture adequate volume Performed at [facility], [address] [place], [place] [address]    Culture   Final    NO GROWTH 2 DAYS Performed at [facility], [address] [place], [place] [address]    Report Status PENDING  Incomplete  Blood culture (routine x 2)     Status: None (Preliminary result)   Collection Time: 10/26/24 10:04 AM   Specimen: BLOOD LEFT ARM  Result Value Ref Range Status   Specimen Description   Final    BLOOD LEFT ARM Performed at [facility], [address], [place], [place] [address]    Special Requests   Final    BOTTLES DRAWN AEROBIC AND ANAEROBIC Blood Culture adequate volume Performed at [facility], [address] [place], [place] [address]    Culture   Final    NO GROWTH 2 DAYS Performed at [facility], [address] [place], [place] [address]    Report Status PENDING  Incomplete  MRSA Next Gen by PCR, Nasal     Status: None   Collection Time: 10/26/24 11:53 PM   Specimen: Nasal Mucosa; Nasal Swab  Result Value Ref Range Status   MRSA by PCR Next Gen NOT DETECTED NOT DETECTED Final  Comment: (NOTE) The GeneXpert MRSA Assay (FDA approved for NASAL specimens only), is one component of a comprehensive MRSA colonization surveillance program. It is not intended to diagnose MRSA infection nor to guide or monitor treatment for MRSA infections. Test performance is not FDA approved in patients less than 22 years old. Performed at [facility], [address] [place], [place] [address]     Time coordinating discharge: 45 minutes  Signed: Jesse Hirst  [facility] 10/28/2024, 11:26 AM

## 2024-10-28 NOTE — Progress Notes (Signed)
 Discharge    Patient expressed verbal understanding of discharge POC.   Patient given time to ask any questions.  Additional education explained to patient by Primary RN.    Alert oriented in good spirits.   Tele/CCMD/Cindy.  No PIV on during discharge.    Per RN Patient ok to drive home.  Car parked in [place].  Discharge to [facility].  Discharge Lounge updated.

## 2024-10-28 NOTE — Plan of Care (Signed)
  Problem: Pain Managment: Goal: General experience of comfort will improve and/or be controlled Outcome: Progressing   Problem: Safety: Goal: Ability to remain free from injury will improve Outcome: Progressing

## 2024-10-31 LAB — CULTURE, BLOOD (ROUTINE X 2)
Culture: NO GROWTH
Culture: NO GROWTH
Special Requests: ADEQUATE
Special Requests: ADEQUATE

## 2024-11-01 ENCOUNTER — Ambulatory Visit: Payer: Medicare HMO | Admitting: Family Medicine

## 2024-11-01 ENCOUNTER — Encounter: Payer: Self-pay | Admitting: Family Medicine

## 2024-11-01 VITALS — BP 124/62 | HR 67 | Temp 97.9°F | Ht 69.0 in | Wt 138.4 lb

## 2024-11-01 DIAGNOSIS — E785 Hyperlipidemia, unspecified: Secondary | ICD-10-CM | POA: Diagnosis not present

## 2024-11-01 DIAGNOSIS — Z0001 Encounter for general adult medical examination with abnormal findings: Secondary | ICD-10-CM

## 2024-11-01 DIAGNOSIS — E1169 Type 2 diabetes mellitus with other specified complication: Secondary | ICD-10-CM | POA: Diagnosis not present

## 2024-11-01 DIAGNOSIS — Z0000 Encounter for general adult medical examination without abnormal findings: Secondary | ICD-10-CM | POA: Diagnosis not present

## 2024-11-01 DIAGNOSIS — C8307 Small cell B-cell lymphoma, spleen: Secondary | ICD-10-CM | POA: Diagnosis not present

## 2024-11-01 DIAGNOSIS — Z7189 Other specified counseling: Secondary | ICD-10-CM

## 2024-11-01 DIAGNOSIS — D696 Thrombocytopenia, unspecified: Secondary | ICD-10-CM

## 2024-11-01 DIAGNOSIS — G4700 Insomnia, unspecified: Secondary | ICD-10-CM | POA: Diagnosis not present

## 2024-11-01 DIAGNOSIS — S40021D Contusion of right upper arm, subsequent encounter: Secondary | ICD-10-CM

## 2024-11-01 DIAGNOSIS — I483 Typical atrial flutter: Secondary | ICD-10-CM | POA: Diagnosis not present

## 2024-11-01 NOTE — Assessment & Plan Note (Signed)
 Chronic, diet controlled.  Continue to encourage low sugar low carb diabetic diet.

## 2024-11-01 NOTE — Assessment & Plan Note (Signed)
 Spontaneous hematoma of right forearm.  Hospital records reviewed.  Continue compression wrap.  Continue tylenol  PRN pain, continue to hold eliquis , will touch base with cardiology and oncology about ongoing eliquis  use.

## 2024-11-01 NOTE — Assessment & Plan Note (Signed)
 Appreciate oncology care.

## 2024-11-01 NOTE — Assessment & Plan Note (Addendum)
 L theanine didn't help. Melatonin not too helpful.  Actually notes improvement over last few months.

## 2024-11-01 NOTE — Assessment & Plan Note (Signed)
 Plt remain stable low 100s

## 2024-11-01 NOTE — Assessment & Plan Note (Signed)
 Previously discussed.

## 2024-11-01 NOTE — Progress Notes (Signed)
 Ph: [phone] Fax: [phone]   Patient ID: Keith Jordan, male    DOB: 1941/02/17, 83 y.o.   MRN: [id number]  This visit was conducted in person.  BP 124/62   Pulse 67   Temp 97.9 F (36.6 C) (Oral)   Ht 5' 9 (1.753 m)   Wt 138 lb 6.4 oz (62.8 kg)   SpO2 96%   BMI 20.44 kg/m    CC: CPE, hosp f/u visit  Subjective:   HPI: Keith Jordan is a 83 y.o. male presenting on 11/01/2024 for Annual Exam and Arm Swelling (Right arm, unable to use fingers due to swelling )   Saw health advisor 09/2024 for medicare wellness visit. Note reviewed.   No results found.  Flowsheet Row Office Visit from 11/01/2024 in [facility]  PHQ-2 Total Score 0       11/01/2024   11:32 AM 10/24/2024   12:48 PM 10/23/2024    9:48 AM 09/14/2024    3:51 PM 09/08/2024    1:45 PM  Fall Risk   Falls in the past year? 0 0 0 0 0  Number falls in past yr: 0 0 0 0 0  Injury with Fall? 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 WBC baseline 130-140k. Also continues allopurinol    Typical aflutter found on Zio monitor use s/p successful ablation 06/2021 followed by cardiologist Dr [name] Tobb and EP Dr Inocencio. did have recurrent aflutter after ablation. Continues Eliquis  and carvedilol  twice daily. Heart monitor reassuring 12/2022.    Diet controlled diabetes - continues monitoring diet.    H/o recurrent cellulitis of lower extremities, h/o diabetes, venous insufficiency. Latest episode of cellulitis was 08/2024, treated with keflex  and doxycycline  course.  H/o spontaneous L leg hematoma 04/2023.   Most recently hospitalized last week for severe spontaneous RUE hematoma, concern for compression of  radial artery, s/p vascular and orthopedic surgery.  Recommendations at discharge:  Tylenol  1 g 3 times daily scheduled for pain control Eliquis  has been held till hematoma resolves. Follow-up with PCP for symptom monitoring and discussion on reinitiation of Eliquis  Continue to follow-up with oncology as an outpatient.   Sleep maintenance insomnia - averages 6-7 hours of sleep/night. Wakes up 3-4 times a night. No significant nocturia. Some better off diltiazem . Melatonin 3-6mg  helps him fall asleep, but still having trouble with sleep maintenance insomnia. L theanine didn't help.   Preventative: COLONOSCOPY Date: 12/2013 no polyps, no rpt due Keith Jordan). Aged out.  Prostate cancer screening - h/o BPH. Nocturia x1-2. Denies trouble with stream. Aged out of screening.  Lung cancer screening - never smoker  Flu shot yearly COVID vaccine - Pfizer 01/2020 x2, booster 11/2021, 10/2023 - suggested stopping COVID vaccine Tetanus shot unsure  Prevnar-13 2017, pneumovax 2018, prevnar-20 - to consider RSV - 10/2023 Shingrix - 10/2022, 02/2023 Advanced directive discussion - scanned 2022. Son Keith Jordan is HCPOA then Keith Jordan and Keith Jordan. Grants discretion to Keith Jordan regarding life prolonging measures. Would want to be full code but wouldn't want prolonged life support if terminal condition. Doesn't know about feeding tube, leaning against this.  Sleep - averaging 6-7 hours/night  Seat belt use discussed  Sunscreen use discussed, no changing moles on skin  Non smoker  Alcohol use - none  Dentist Q51mo  Eye exam yearly  Bowel - no constipation  Bladder - no incontinence    Lives with wife, 2 cats  Occ: retired counsellor  Activity: works in yard, uses [employer] company at home  Diet: some water, likes flavored sparkling water, fruits/vegetables daily      Relevant past medical, surgical, family and social history reviewed and updated as indicated. Interim medical history since our last visit  reviewed. Allergies and medications reviewed and updated. Outpatient Medications Prior to Visit  Medication Sig Dispense Refill   acetaminophen  (TYLENOL ) 500 MG tablet Take 2 tablets (1,000 mg total) by mouth 3 (three) times daily.     Ascorbic Acid (VITAMIN C) 1000 MG tablet Take 500 mg by mouth 2 (two) times daily.     atorvastatin  (LIPITOR) 10 MG tablet Take 1 tablet (10 mg total) by mouth daily. 90 tablet 4   carboxymethylcellulose (REFRESH PLUS) 0.5 % SOLN Place 1 drop into both eyes 2 (two) times daily as needed (dry eyes).     carvedilol  (COREG ) 6.25 MG tablet TAKE 1 TABLET BY MOUTH TWICE A DAY 180 tablet 2   melatonin 3 MG TABS tablet Take 3 mg by mouth at bedtime.     Multiple Vitamins-Minerals (MULTIVITAMIN WITH MINERALS) tablet Take 1 tablet by mouth daily. Unknown strenght     Multiple Vitamins-Minerals (PRESERVISION AREDS) CAPS Take 1 capsule by mouth in the morning and at bedtime. 180 capsule 3   Olopatadine  HCl 0.2 % SOLN Apply 1 drop to eye daily. (Both) 2.5 mL 0   sodium chloride  (OCEAN) 0.65 % SOLN nasal spray Place 1 spray into both nostrils as needed for congestion.     triamcinolone  (NASACORT ) 55 MCG/ACT AERO nasal inhaler Place 1 spray into the nose daily as needed (sinus congestion). 1 each    apixaban  (ELIQUIS ) 5 MG TABS tablet Take 1 tablet (5 mg total) by mouth 2 (two) times daily. (Patient not taking: Reported on 11/01/2024) 60 tablet 5   traMADol HCl 25 MG TABS Take 25 mg by mouth every 6 (six) hours as needed. (Patient not taking: Reported on 11/01/2024) 15 tablet 0   L-Theanine 200 MG CAPS Take 1 Capful by mouth at bedtime as needed (sleep maintenance insomnia). (Patient not taking: Reported on 11/01/2024)     No facility-administered medications prior to visit.     Per HPI unless specifically indicated in ROS section below Review of Systems  Constitutional:  Positive for appetite change. Negative for activity change, chills, fatigue, fever and unexpected weight  change.  HENT:  Negative for hearing loss.   Eyes:  Negative for visual disturbance.  Respiratory:  Negative for cough, chest tightness, shortness of breath and wheezing.   Cardiovascular:  Positive for leg swelling (chronic left sided). Negative for chest pain and palpitations.  Gastrointestinal:  Negative for abdominal distention, abdominal pain, blood in stool, constipation, diarrhea, nausea and vomiting.  Genitourinary:  Negative for difficulty urinating and hematuria.  Musculoskeletal:  Negative for arthralgias, myalgias and neck pain.       R arm swelling  Skin:  Negative for rash.  Neurological:  Negative for dizziness, seizures, syncope and headaches.  Hematological:  Negative for adenopathy. Does not bruise/bleed easily.  Psychiatric/Behavioral:  Negative for dysphoric mood. The patient is not nervous/anxious.     Objective:  BP 124/62   Pulse 67   Temp 97.9  F (36.6 C) (Oral)   Ht 5' 9 (1.753 m)   Wt 138 lb 6.4 oz (62.8 kg)   SpO2 96%   BMI 20.44 kg/m   Wt Readings from Last 3 Encounters:  11/01/24 138 lb 6.4 oz (62.8 kg)  10/27/24 134 lb 11.2 oz (61.1 kg)  10/24/24 140 lb (63.5 kg)      Physical Exam Vitals and nursing note reviewed.  Constitutional:      General: He is not in acute distress.    Appearance: Normal appearance. He is well-developed. He is not ill-appearing.  HENT:     Head: Normocephalic and atraumatic.     Right Ear: Hearing, tympanic membrane, ear canal and external ear normal.     Left Ear: Hearing, tympanic membrane, ear canal and external ear normal.     Mouth/Throat:     Mouth: Mucous membranes are moist.     Pharynx: Oropharynx is clear. No oropharyngeal exudate or posterior oropharyngeal erythema.  Eyes:     General: No scleral icterus.    Extraocular Movements: Extraocular movements intact.     Conjunctiva/sclera: Conjunctivae normal.     Pupils: Pupils are equal, round, and reactive to light.  Neck:     Thyroid : No thyroid  mass or  thyromegaly.     Vascular: No carotid bruit.  Cardiovascular:     Rate and Rhythm: Normal rate and regular rhythm.     Pulses: Normal pulses.          Radial pulses are 2+ on the right side and 2+ on the left side.     Heart sounds: Normal heart sounds. No murmur heard. Pulmonary:     Effort: Pulmonary effort is normal. No respiratory distress.     Breath sounds: Normal breath sounds. No wheezing, rhonchi or rales.  Abdominal:     General: Bowel sounds are normal. There is no distension.     Palpations: Abdomen is soft. There is no mass.     Tenderness: There is no abdominal tenderness. There is no guarding or rebound.     Hernia: No hernia is present.  Musculoskeletal:        General: Swelling present. Normal range of motion.     Cervical back: Normal range of motion and neck supple.     Right lower leg: No edema.     Left lower leg: No edema.     Comments:  Marked swelling to RUE distal to elbow  2+ ulnar pulses bilaterally, mildly diminished radial pulse on right   Lymphadenopathy:     Cervical: No cervical adenopathy.  Skin:    General: Skin is warm and dry.     Findings: No rash.  Neurological:     General: No focal deficit present.     Mental Status: He is alert and oriented to person, place, and time.  Psychiatric:        Mood and Affect: Mood normal.        Behavior: Behavior normal.        Thought Content: Thought content normal.        Judgment: Judgment normal.       Results for orders placed or performed during the hospital encounter of 10/26/24  Blood culture (routine x 2)   Collection Time: 10/26/24  9:49 AM   Specimen: BLOOD  Result Value Ref Range   Specimen Description      BLOOD BLOOD LEFT FOREARM Performed at [facility], [address], [place], [place] [address]  Special Requests      BOTTLES DRAWN AEROBIC AND ANAEROBIC Blood Culture adequate volume Performed at [facility], [address],  [place], [place] [address]    Culture      NO GROWTH 5 DAYS Performed at [facility], [address], [place], [place] [address]    Report Status 10/31/2024 FINAL   CBC with Differential   Collection Time: 10/26/24  9:51 AM  Result Value Ref Range   WBC 219.0 (HH) 4.0 - 10.5 K/uL   RBC 3.31 (L) 4.22 - 5.81 MIL/uL   Hemoglobin 10.0 (L) 13.0 - 17.0 g/dL   HCT 67.7 (L) 60.9 - 47.9 %   MCV 97.3 80.0 - 100.0 fL   MCH 30.2 26.0 - 34.0 pg   MCHC 31.1 30.0 - 36.0 g/dL   RDW 84.3 (H) 88.4 - 84.4 %   Platelets 110 (L) 150 - 400 K/uL   nRBC 0.0 0.0 - 0.2 %   Neutrophils Relative % 5 %   Neutro Abs 11.0 (H) 1.7 - 7.7 K/uL   Lymphocytes Relative 89 %   Lymphs Abs 194.9 (H) 0.7 - 4.0 K/uL   Monocytes Relative 6 %   Monocytes Absolute 13.1 (H) 0.1 - 1.0 K/uL   Eosinophils Relative 0 %   Eosinophils Absolute 0.0 0.0 - 0.5 K/uL   Basophils Relative 0 %   Basophils Absolute 0.0 0.0 - 0.1 K/uL   WBC Morphology See Note    Smear Review Normal platelet morphology    Polychromasia PRESENT   Comprehensive metabolic panel   Collection Time: 10/26/24  9:51 AM  Result Value Ref Range   Sodium 141 135 - 145 mmol/L   Potassium 3.7 3.5 - 5.1 mmol/L   Chloride 102 98 - 111 mmol/L   CO2 28 22 - 32 mmol/L   Glucose, Bld 128 (H) 70 - 99 mg/dL   BUN 30 (H) 8 - 23 mg/dL   Creatinine, Ser 9.10 0.61 - 1.24 mg/dL   Calcium  9.8 8.9 - 10.3 mg/dL   Total Protein 7.0 6.5 - 8.1 g/dL   Albumin 4.0 3.5 - 5.0 g/dL   AST 61 (H) 15 - 41 U/L   ALT 13 0 - 44 U/L   Alkaline Phosphatase 94 38 - 126 U/L   Total Bilirubin 0.6 0.0 - 1.2 mg/dL   GFR, Estimated >39 >39 mL/min   Anion gap 11 5 - 15  Lactic acid, plasma   Collection Time: 10/26/24  9:51 AM  Result Value Ref Range   Lactic Acid, Venous 1.0 0.5 - 1.9 mmol/L  Protime-INR   Collection Time: 10/26/24  9:51 AM  Result Value Ref Range   Prothrombin Time 22.9 (H) 11.4 - 15.2 seconds   INR 1.9 (H) 0.8 - 1.2  APTT   Collection Time: 10/26/24  9:51 AM   Result Value Ref Range   aPTT 32 24 - 36 seconds  Blood culture (routine x 2)   Collection Time: 10/26/24 10:04 AM   Specimen: BLOOD LEFT ARM  Result Value Ref Range   Specimen Description      BLOOD LEFT ARM Performed at [facility], [address], [place], [place] [address]    Special Requests      BOTTLES DRAWN AEROBIC AND ANAEROBIC Blood Culture adequate volume Performed at [facility], [address] [place], [place] [address]    Culture      NO GROWTH 5 DAYS Performed at [facility],  [address], [place], [place] [address]    Report Status 10/31/2024 FINAL   Lactic acid, plasma   Collection Time: 10/26/24 11:35 AM  Result Value Ref Range   Lactic Acid, Venous 1.1 0.5 - 1.9 mmol/L  MRSA Next Gen by PCR, Nasal   Collection Time: 10/26/24 11:53 PM   Specimen: Nasal Mucosa; Nasal Swab  Result Value Ref Range   MRSA by PCR Next Gen NOT DETECTED NOT DETECTED    Assessment & Plan:   Problem List Items Addressed This Visit     Encounter for general adult medical examination with abnormal findings - Primary (Chronic)   Preventative protocols reviewed and updated unless pt declined. Discussed healthy diet and lifestyle.       Advanced care planning/counseling discussion (Chronic)   Previously discussed      Hyperlipidemia associated with type 2 diabetes mellitus (HCC)   Chronic, stable period on low dose atorvastatin  10mg  - continue The ASCVD Risk score (Arnett DK, et al., 2019) failed to calculate for the following reasons:   The 2019 ASCVD risk score is only valid for ages 79 to 17       Type 2 diabetes mellitus with other specified complication (HCC)   Chronic, diet controlled.  Continue to encourage low sugar low carb diabetic diet.       Splenic marginal zone b-cell lymphoma [facility])   Appreciate oncology care.       Typical atrial flutter [facility])   Sees cardiology, s/p ablation, continues carvedilol  and eliquis  5mg  bid.   Encouraged he schedule cards f/u as due.  I will touch base with cardiology on ongoing eliquis  use in setting of recurrent spontaneous hematomas.      Insomnia   L theanine didn't help. Melatonin not too helpful.  Actually notes improvement over last few months.       Thrombocytopenia   Plt remain stable low 100s      Hematoma of arm, right, subsequent encounter   Spontaneous hematoma of right forearm.  Hospital records reviewed.  Continue compression wrap.  Continue tylenol  PRN pain, continue to hold eliquis , will touch base with cardiology and oncology about ongoing eliquis  use.         No orders of the defined types were placed in this encounter.   No orders of the defined types were placed in this encounter.   Patient Instructions  Call to schedule cardiology appointment to discuss ongoing eliquis  use in setting of recurrent spontaneous hematomas.  Continue to hold eliquis  for now.  I will touch base with heart doctor and blood doctor about your eliquis  use.  Continue compression wrap, continue arm elevation, continue tylenol  use for pain, may try tramadol at night as needed.  Good to see you today Return to see me in 6 months for diabetes follow up visit.   Follow up plan: Return in about 6 months (around 05/01/2025) for follow up visit.  Anton Blas, MD

## 2024-11-01 NOTE — Assessment & Plan Note (Signed)
 Sees cardiology, s/p ablation, continues carvedilol  and eliquis  5mg  bid.  Encouraged he schedule cards f/u as due.  I will touch base with cardiology on ongoing eliquis  use in setting of recurrent spontaneous hematomas.

## 2024-11-01 NOTE — Assessment & Plan Note (Signed)
 Chronic, stable period on low dose atorvastatin  10mg  - continue The ASCVD Risk score (Arnett DK, et al., 2019) failed to calculate for the following reasons:   The 2019 ASCVD risk score is only valid for ages 6 to 55

## 2024-11-01 NOTE — Assessment & Plan Note (Signed)
 Preventative protocols reviewed and updated unless pt declined. Discussed healthy diet and lifestyle.

## 2024-11-01 NOTE — Patient Instructions (Addendum)
 Call to schedule cardiology appointment to discuss ongoing eliquis  use in setting of recurrent spontaneous hematomas.  Continue to hold eliquis  for now.  I will touch base with heart doctor and blood doctor about your eliquis  use.  Continue compression wrap, continue arm elevation, continue tylenol  use for pain, may try tramadol at night as needed.  Good to see you today Return to see me in 6 months for diabetes follow up visit.

## 2024-11-02 ENCOUNTER — Telehealth: Payer: Self-pay | Admitting: Cardiology

## 2024-11-02 NOTE — Telephone Encounter (Signed)
 Returned patient's call, no answer. VM full, unable to leave message. Patient was in ED for hemtoma 10/30-11/1 and DC instructions say to ask his cardiologist about resuming eliquis .

## 2024-11-02 NOTE — Telephone Encounter (Signed)
 Pt c/o medication issue:  1. Name of Medication: apixaban  (ELIQUIS ) 5 MG TABS tablet   2. How are you currently taking this medication (dosage and times per day)?   3. Are you having a reaction (difficulty breathing--STAT)? No  4. What is your medication issue? Pt states that he was to stop medication while in the hospital for a Hematoma. Pt would like to know when he needs to start back taking. Please advise

## 2024-11-06 ENCOUNTER — Encounter: Payer: Self-pay | Admitting: Cardiology

## 2024-11-06 NOTE — Telephone Encounter (Signed)
 Tried to call patient, not able to leave message, voicemail was full.

## 2024-11-06 NOTE — Telephone Encounter (Signed)
 Pt returning call about Eliquis  as well as to schedule his annual in January. Noticed previous phone note and scheduled him 11/13 w/ Dr. Sheena for hospital f/u and Eliquis  medication management.

## 2024-11-06 NOTE — Telephone Encounter (Signed)
 error

## 2024-11-09 ENCOUNTER — Ambulatory Visit: Attending: Cardiology | Admitting: Cardiology

## 2024-11-09 ENCOUNTER — Encounter: Payer: Self-pay | Admitting: Cardiology

## 2024-11-09 VITALS — BP 142/60 | HR 67 | Ht 69.0 in | Wt 134.8 lb

## 2024-11-09 DIAGNOSIS — Z7689 Persons encountering health services in other specified circumstances: Secondary | ICD-10-CM

## 2024-11-09 DIAGNOSIS — D62 Acute posthemorrhagic anemia: Secondary | ICD-10-CM | POA: Diagnosis not present

## 2024-11-09 DIAGNOSIS — I483 Typical atrial flutter: Secondary | ICD-10-CM | POA: Diagnosis not present

## 2024-11-09 NOTE — Telephone Encounter (Signed)
 Pt seen today

## 2024-11-09 NOTE — Patient Instructions (Signed)
 Medication Instructions:  Your physician recommends that you continue on your current medications as directed. Please refer to the Current Medication list given to you today.  *If you need a refill on your cardiac medications before your next appointment, please call your pharmacy*  Lab Work: CBC If you have labs (blood work) drawn today and your tests are completely normal, you will receive your results only by: MyChart Message (if you have MyChart) OR A paper copy in the mail If you have any lab test that is abnormal or we need to change your treatment, we will call you to review the results.  Follow-Up: At [facility], you and your health needs are our priority.  As part of our continuing mission to provide you with exceptional heart care, our providers are all part of one team.  This team includes your primary Cardiologist (physician) and Advanced Practice Providers or APPs (Physician Assistants and Nurse Practitioners) who all work together to provide you with the care you need, when you need it.  Your next appointment:   6 month(s)  Provider:   [name] Tobb, DO     Other Instructions Please set up an appointment with Dr. Cindie. - urgent

## 2024-11-09 NOTE — Progress Notes (Signed)
 Cardiology Office Note:    Date:  11/09/2024   ID:  PLUMER MITTELSTAEDT, DOB 1941-05-24, MRN [id number]  PCP:  Rilla Baller, MD  Cardiologist:  Aairah Negrette, DO  Electrophysiologist:  None   Referring MD: Rilla Baller, MD    I am doing well  History of Present Illness:    Keith Jordan is a 83 y.o. 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  His last visit with me was in January 2025 at that time he was doing well from a cardiovascular standpoint.   Since that visit he has had  hospitalized  for spontaneous non-traumatic severe right arm hematoma treated conservatively (saw vascular and orthopedics). Eliquis  is currently on hold.  Of note he also developed a non-traumatic left thigh hematoma develop 04/2023 that resolved on its own.     Past Medical History:  Diagnosis Date   Abnormal breath sounds 10/11/2020   Acute sinusitis 01/29/2021   Advanced care planning/counseling discussion 09/01/2016   BPH (benign prostatic hyperplasia) 09/29/2018   Ear fullness, bilateral 04/03/2021   Health maintenance examination 09/01/2016   Hyperlipidemia    Irregular heart beat 03/24/2021   Ischemic optic neuropathy 2006   [place] at [facility]   Leukocytosis 01/30/2021   Severe s/p ER eval 01/2021   Medicare annual wellness visit, subsequent 10/04/2019   Nausea without vomiting 03/24/2021   Personal history of colonic adenoma 07/30/2008   Prediabetes 07/05/2016   A1c 6.5% 03/2014    Prediabetes 07/05/2016   A1c 6.5% 03/2014    Primary osteoarthritis of knee    bilateral s/p L knee replacement, receives R knee injections libbie, [name])   Sebaceous cyst 01/30/2020   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    Past Surgical History:  Procedure Laterality Date   A-FLUTTER ABLATION N/A 07/11/2021   Procedure: A-FLUTTER ABLATION;  Surgeon: Inocencio Soyla Lunger, MD;  Location: [facility]  INVASIVE CV LAB;  Service: Cardiovascular;  Laterality: N/A;   COLONOSCOPY  12/2013   no polyps, no rpt due [place])   CYST EXCISION  01/2020   TOTAL KNEE ARTHROPLASTY Left 2005   [name]    Current Medications: Current Meds  Medication Sig   acetaminophen  (TYLENOL ) 500 MG tablet Take 2 tablets (1,000 mg total) by mouth 3 (three) times daily.   Ascorbic Acid (VITAMIN C) 1000 MG tablet Take 500 mg by mouth 2 (two) times daily.   atorvastatin  (LIPITOR) 10 MG tablet Take 1 tablet (10 mg total) by mouth daily.   carboxymethylcellulose (REFRESH PLUS) 0.5 % SOLN Place 1 drop into both eyes 2 (two) times daily as needed (dry eyes).   carvedilol  (COREG ) 6.25 MG tablet TAKE 1 TABLET BY MOUTH TWICE A DAY   melatonin 3 MG TABS tablet Take 3 mg by mouth at bedtime.   Multiple Vitamins-Minerals (MULTIVITAMIN WITH MINERALS) tablet Take 1 tablet by mouth daily. Unknown strenght   Multiple Vitamins-Minerals (PRESERVISION AREDS) CAPS Take 1 capsule by mouth in the morning and at bedtime.   Olopatadine  HCl 0.2 % SOLN Apply 1 drop to eye daily. (Both)   sodium chloride  (OCEAN) 0.65 % SOLN nasal spray Place 1 spray into both nostrils as needed for congestion.   triamcinolone  (NASACORT ) 55 MCG/ACT AERO nasal inhaler Place 1 spray into the nose daily as needed (sinus congestion).     Allergies:   Patient has no known allergies.   Social History   Socioeconomic  History   Marital status: Married    Spouse name: Not on file   Number of children: Not on file   Years of education: Not on file   Highest education level: Not on file  Occupational History   Not on file  Tobacco Use   Smoking status: Never   Smokeless tobacco: Never  Vaping Use   Vaping status: Never Used  Substance and Sexual Activity   Alcohol use: Yes    Comment: occasional   Drug use: No   Sexual activity: Not Currently  Other Topics Concern   Not on file  Social History Narrative   Lives with wife, 2 cats   Occ: retired  counsellor   Activity: works in yard   Diet: some water, fruits/vegetables daily   Social Drivers of Corporate Investment Banker Strain: Low Risk  (10/23/2024)   Overall Financial Resource Strain (CARDIA)    Difficulty of Paying Living Expenses: Not hard at all  Food Insecurity: No Food Insecurity (10/26/2024)   Hunger Vital Sign    Worried About Running Out of Food in the Last Year: Never true    Ran Out of Food in the Last Year: Never true  Transportation Needs: No Transportation Needs (10/26/2024)   PRAPARE - Administrator, Civil Service (Medical): No    Lack of Transportation (Non-Medical): No  Physical Activity: Insufficiently Active (10/23/2024)   Exercise Vital Sign    Days of Exercise per Week: 3 days    Minutes of Exercise per Session: 30 min  Stress: No Stress Concern Present (10/23/2024)   [employer] of Occupational Health - Occupational Stress Questionnaire    Feeling of Stress: Not at all  Social Connections: Moderately Isolated (10/26/2024)   Social Connection and Isolation Panel    Frequency of Communication with Friends and Family: Twice a week    Frequency of Social Gatherings with Friends and Family: Once a week    Attends Religious Services: Never    Database Administrator or Organizations: No    Attends Engineer, Structural: Never    Marital Status: Married     Family History: The patient's family history includes Arthritis in his father; Blindness (age of onset: 37) in his mother; Lung disease in his father. There is no history of Colon cancer, Pancreatic cancer, Stomach cancer, Esophageal cancer, Rectal cancer, Cancer, Diabetes, CAD, or Stroke.  ROS:   Review of Systems  Constitution: Negative for decreased appetite, fever and weight gain.  HENT: Negative for congestion, ear discharge, hoarse voice and sore throat.   Eyes: Negative for discharge, redness, vision loss in right eye and visual halos.  Cardiovascular: Negative for  chest pain, dyspnea on exertion, leg swelling, orthopnea and palpitations.  Respiratory: Negative for cough, hemoptysis, shortness of breath and snoring.   Endocrine: Negative for heat intolerance and polyphagia.  Hematologic/Lymphatic: Negative for bleeding problem. Does not bruise/bleed easily.  Skin: Negative for flushing, nail changes, rash and suspicious lesions.  Musculoskeletal: Negative for arthritis, joint pain, muscle cramps, myalgias, neck pain and stiffness.  Gastrointestinal: Negative for abdominal pain, bowel incontinence, diarrhea and excessive appetite.  Genitourinary: Negative for decreased libido, genital sores and incomplete emptying.  Neurological: Negative for brief paralysis, focal weakness, headaches and loss of balance.  Psychiatric/Behavioral: Negative for altered mental status, depression and suicidal ideas.  Allergic/Immunologic: Negative for HIV exposure and persistent infections.    EKGs/Labs/Other Studies Reviewed:    The following studies were reviewed today:  EKG: Sinus bradycardia, HR 59 bpm  01/01/2023 Patch Wear Time:  13 days and 23 hours (2023-12-18T12:36:37-0500 to 2024-01-01T12:36:29-0500)   Patient had a min HR of 47 bpm, max HR of 81 bpm, and avg HR of 59 bpm. Predominant underlying rhythm was Sinus Rhythm. First Degree AV Block was present. Isolated SVEs were rare (<1.0%), SVE Couplets were rare (<1.0%), and no SVE Triplets were present.  Isolated VEs were rare (<1.0%), and no VE Couplets or VE Triplets were present.   Symptoms associated with sinus rhythm.   Conclusion: Normal unremarkable study.    TTE 2022 IMPRESSIONS  1. Left ventricular ejection fraction, by estimation, is 70 to 75%. The  left ventricle has hyperdynamic function. The left ventricle has no  regional wall motion abnormalities. Left ventricular diastolic parameters  are consistent with Grade I diastolic  dysfunction (impaired relaxation).   2. Right ventricular systolic  function is normal. The right ventricular  size is mildly enlarged. There is mildly elevated pulmonary artery  systolic pressure. The estimated right ventricular systolic pressure is  36.4 mmHg.   3. The mitral valve is normal in structure. No evidence of mitral valve  regurgitation. No evidence of mitral stenosis.   4. The aortic valve is normal in structure. Aortic valve regurgitation is  not visualized. No aortic stenosis is present.   5. The inferior vena cava is normal in size with greater than 50%  respiratory variability, suggesting right atrial pressure of 3 mmHg.   FINDINGS   Left Ventricle: Left ventricular ejection fraction, by estimation, is 70  to 75%. The left ventricle has hyperdynamic function. The left ventricle  has no regional wall motion abnormalities. The left ventricular internal  cavity size was normal in size.  There is no left ventricular hypertrophy. Left ventricular diastolic  parameters are consistent with Grade I diastolic dysfunction (impaired  relaxation).   Right Ventricle: The right ventricular size is mildly enlarged. No  increase in right ventricular wall thickness. Right ventricular systolic  function is normal. There is mildly elevated pulmonary artery systolic  pressure. The tricuspid regurgitant  velocity is 2.89 m/s, and with an assumed right atrial pressure of 3 mmHg,  the estimated right ventricular systolic pressure is 36.4 mmHg.   Left Atrium: Left atrial size was normal in size.   Right Atrium: Right atrial size was normal in size.   Pericardium: There is no evidence of pericardial effusion.   Mitral Valve: The mitral valve is normal in structure. No evidence of  mitral valve regurgitation. No evidence of mitral valve stenosis.   Tricuspid Valve: The tricuspid valve is normal in structure. Tricuspid  valve regurgitation is mild . No evidence of tricuspid stenosis.   Aortic Valve: The aortic valve is normal in structure. Aortic valve   regurgitation is not visualized. No aortic stenosis is present.   Pulmonic Valve: The pulmonic valve was normal in structure. Pulmonic valve  regurgitation is not visualized. No evidence of pulmonic stenosis.   Aorta: The aortic root is normal in size and structure.   Venous: The inferior vena cava is normal in size with greater than 50%  respiratory variability, suggesting right atrial pressure of 3 mmHg.   IAS/Shunts: No atrial level shunt detected by color flow Doppler.        Zio monitor The patient wore the monitor for 13 days, 21 hours starting April 17, 2021.   Indication: Palpitations   The minimum heart rate was 46 bpm, maximum heart rate was 149 bpm,  and average heart rate was 68 bpm. Predominant underlying rhythm was Sinus Rhythm.   3 Supraventricular Tachycardia runs occurred, the run with the fastest interval lasting 4 beats with a max rate of 126 bpm, the longest lasting 7 beats with an avg rate of 103 bpm.     Atrial Flutter occurred (3% burden), ranging from 46-149 bpm (avg of 82 bpm), the longest lasting 1 hour 41 mins with an avg rate of 92 bpm.   Premature atrial complexes were occasional (1.0%, 13493). Premature Ventricular complexes were rare less than 1%. No ventricular tachycardia, no pauses present.   Symptoms are associated with atrial flutter, sinus rhythm and premature atrial complexes.       Conclusion: This study is remarkable for the following:                       1.  Symptomatic atrial flutter                       2.  Occasional premature atrial complexes.    Recent Labs: 10/26/2024: ALT 13; BUN 30; Creatinine, Ser 0.89; Hemoglobin 10.0; Platelets 110; Potassium 3.7; Sodium 141  Recent Lipid Panel    Component Value Date/Time   CHOL 92 10/25/2024 0806   CHOL 140 04/16/2015 0000   CHOL 140 04/16/2015 0000   TRIG 117.0 10/25/2024 0806   TRIG 94 04/16/2015 0000   TRIG 94 04/16/2015 0000   HDL 28.00 (L) 10/25/2024 0806   CHOLHDL 3  10/25/2024 0806   VLDL 23.4 10/25/2024 0806   LDLCALC 41 10/25/2024 0806   LDLCALC 86 04/16/2015 0000   LDLCALC 86 04/16/2015 0000    Physical Exam:    VS:  BP (!) 142/60 (BP Location: Left Arm, Patient Position: Sitting, Cuff Size: Normal)   Pulse 67   Ht 5' 9 (1.753 m)   Wt 134 lb 12.8 oz (61.1 kg)   SpO2 94%   BMI 19.91 kg/m     Wt Readings from Last 3 Encounters:  11/09/24 134 lb 12.8 oz (61.1 kg)  11/01/24 138 lb 6.4 oz (62.8 kg)  10/27/24 134 lb 11.2 oz (61.1 kg)     GEN: Well nourished, well developed in no acute distress HEENT: Normal NECK: No JVD; No carotid bruits LYMPHATICS: No lymphadenopathy CARDIAC: S1S2 noted,RRR, no murmurs, rubs, gallops RESPIRATORY:  Clear to auscultation without rales, wheezing or rhonchi  ABDOMEN: Soft, non-tender, non-distended, +bowel sounds, no guarding. EXTREMITIES: No edema, No cyanosis, no clubbing MUSCULOSKELETAL:  No deformity  SKIN: Warm and dry NEUROLOGIC:  Alert and oriented x 3, non-focal PSYCHIATRIC:  Normal affect, good insight  ASSESSMENT:    1. Anemia due to acute blood loss   2. Encounter to establish care with new doctor   3. Typical atrial flutter (HCC)      PLAN:    He appears to be doing well from a cardiovascular standpoint.  No changes in his medication today.  Due to multiple spontaneous hematomas, they have been a multidisciplinary discussion with surgery and heme-onc shared decision was stopping his eliquis  given h/o recurrent spontaneous hematomas.  This is a very tough situation.  But the risk of bleeding far outweighs now the benefit.  With this I am going to refer him to our [place] team for evaluation, I do believe he is a candidate.  [facility] Referral for Left Atrial Appendage Closure with Non-Valvular Atrial Fibrillation   RAYDIN BIELINSKI is a 83  y.o. male is being referred to the [facility] Team for evaluation for Left Atrial Appendage Closure with Watchman device for  the management of stroke risk resulting form non-valvular atrial fibrillation.    Base upon Mr. Sabado history, he is felt to be a poor candidate for long-term anticoagulation because of recurrent spontaneous hematoma.  The patient has a HAS-BLED score of 3 indicating a Yearly Major Bleeding Risk of 3.74%.      His CHADS2-VASc Score is 2 with an unadjusted Ischemic Stroke Rate (% per year) of 2.2%.    His stroke risk necessitates a strategy of stroke prevention with either long-term oral anticoagulation or left atrial appendage occlusion therapy. We have discussed their bleeding risk in the context of their comorbid medical problems, as well as the rationale for referral for evaluation of Watchman left atrial appendage occlusion therapy. While the patient is at high long-term bleeding risk, they may be appropriate for short-term anticoagulation. Based on this individual patient's stroke and bleeding risk, a shared decision has been made to refer the patient for consideration of Watchman left atrial appendage closure utilizing the [employer] of Cardiology shared decision tool.   The patient is in agreement with the above plan. The patient left the office in stable condition.  The patient will follow up in 6 months or sooner if needed.  Medication Adjustments/Labs and Tests Ordered: Current medicines are reviewed at length with the patient today.  Concerns regarding medicines are outlined above.  Orders Placed This Encounter  Procedures   CBC with Differential/Platelet   Ambulatory referral to Cardiac Electrophysiology   No orders of the defined types were placed in this encounter.   Patient Instructions  Medication Instructions:  Your physician recommends that you continue on your current medications as directed. Please refer to the Current Medication list given to you today.  *If you need a refill on your cardiac medications before your next appointment, please call your  pharmacy*  Lab Work: CBC If you have labs (blood work) drawn today and your tests are completely normal, you will receive your results only by: MyChart Message (if you have MyChart) OR A paper copy in the mail If you have any lab test that is abnormal or we need to change your treatment, we will call you to review the results.  Follow-Up: At [facility], you and your health needs are our priority.  As part of our continuing mission to provide you with exceptional heart care, our providers are all part of one team.  This team includes your primary Cardiologist (physician) and Advanced Practice Providers or APPs (Physician Assistants and Nurse Practitioners) who all work together to provide you with the care you need, when you need it.  Your next appointment:   6 month(s)  Provider:   Dayton Kenley, DO     Other Instructions Please set up an appointment with Dr. Cindie. - urgent    Adopting a Healthy Lifestyle.  Know what a healthy weight is for you (roughly BMI <25) and aim to maintain this   Aim for 7+ servings of fruits and vegetables daily   65-80+ fluid ounces of water or unsweet tea for healthy kidneys   Limit to max 1 drink of alcohol per day; avoid smoking/tobacco   Limit animal fats in diet for cholesterol and heart health - choose grass fed whenever available   Avoid highly processed foods, and foods high in saturated/trans fats   Aim for low stress -  take time to unwind and care for your mental health   Aim for 150 min of moderate intensity exercise weekly for heart health, and weights twice weekly for bone health   Aim for 7-9 hours of sleep daily   When it comes to diets, agreement about the perfect plan isnt easy to find, even among the experts. Experts at the [facility] of [employer] developed an idea known as the Healthy Eating Plate. Just imagine a plate divided into logical, healthy portions.   The emphasis is on diet quality:   Load up  on vegetables and fruits - one-half of your plate: Aim for color and variety, and remember that potatoes dont count.   Go for whole grains - one-quarter of your plate: Whole wheat, barley, wheat berries, quinoa, oats, brown rice, and foods made with them. If you want pasta, go with whole wheat pasta.   Protein power - one-quarter of your plate: Fish, chicken, beans, and nuts are all healthy, versatile protein sources. Limit red meat.   The diet, however, does go beyond the plate, offering a few other suggestions.   Use healthy plant oils, such as olive, canola, soy, corn, sunflower and peanut. Check the labels, and avoid partially hydrogenated oil, which have unhealthy trans fats.   If youre thirsty, drink water. Coffee and tea are good in moderation, but skip sugary drinks and limit milk and dairy products to one or two daily servings.   The type of carbohydrate in the diet is more important than the amount. Some sources of carbohydrates, such as vegetables, fruits, whole grains, and beans-are healthier than others.   Finally, stay active  Signed, Shelle Galdamez, DO  11/09/2024 9:00 AM    [facility]

## 2024-11-10 ENCOUNTER — Telehealth: Payer: Self-pay

## 2024-11-10 LAB — CBC WITH DIFFERENTIAL/PLATELET
Basophils Absolute: 0.3 x10E3/uL — ABNORMAL HIGH (ref 0.0–0.2)
Basos: 0 %
EOS (ABSOLUTE): 0.5 x10E3/uL — ABNORMAL HIGH (ref 0.0–0.4)
Eos: 0 %
Hematocrit: 32.4 % — ABNORMAL LOW (ref 37.5–51.0)
Hemoglobin: 9.4 g/dL — ABNORMAL LOW (ref 13.0–17.7)
Immature Grans (Abs): 0 x10E3/uL (ref 0.0–0.1)
Immature Granulocytes: 0 %
Lymphocytes Absolute: 198.9 x10E3/uL — ABNORMAL HIGH (ref 0.7–3.1)
Lymphs: 88 %
MCH: 28.9 pg (ref 26.6–33.0)
MCHC: 29 g/dL — ABNORMAL LOW (ref 31.5–35.7)
MCV: 100 fL — ABNORMAL HIGH (ref 79–97)
Monocytes Absolute: 21.1 x10E3/uL — ABNORMAL HIGH (ref 0.1–0.9)
Monocytes: 9 %
Neutrophils Absolute: 5.4 x10E3/uL (ref 1.4–7.0)
Neutrophils: 3 %
Platelets: 229 x10E3/uL (ref 150–450)
RBC: 3.25 x10E6/uL — ABNORMAL LOW (ref 4.14–5.80)
RDW: 15 % (ref 11.6–15.4)
WBC: 227 x10E3/uL (ref 3.4–10.8)

## 2024-11-10 NOTE — Telephone Encounter (Signed)
 I received a phone call from [facility] with a critical lab result for this patient. I was told that his WBC are at 227.0. I am wating for the report to be faxed over and I am making MD aware of this for further advise/suggestions.

## 2024-11-13 ENCOUNTER — Telehealth: Payer: Self-pay | Admitting: Cardiology

## 2024-11-13 ENCOUNTER — Ambulatory Visit: Payer: Self-pay | Admitting: Cardiology

## 2024-11-13 NOTE — Telephone Encounter (Signed)
 Patient called in to schedule his referral to EP for eval of watchman. Referral was in for Dr. Cindie, but with Dr. Ramona departure, advised we have Dr. Kennyth and Dr. Almetta that we can get him in with. Offered Dr. Kennyth 12/12 as this is the first available appointment with one of the [facility] doctors for consultation. Patient is wondering what he should do in the event he has a problem being off of his eliquis . Advised I would send a message to Dr. Sheena to advise. He is on the cancellation list for first appointment that becomes available with either Dr. Kennyth or Dr. Almetta.

## 2024-11-20 ENCOUNTER — Ambulatory Visit: Payer: Self-pay

## 2024-11-20 NOTE — Telephone Encounter (Signed)
 Pt reports arm edema is almost resolved, but edema and stiffness of finger joints continues.   FYI Only or Action Required?: FYI only for provider: appointment scheduled on 11/21/24.  Patient was last seen in primary care on 11/01/2024 by Rilla Baller, MD.  Called Nurse Triage reporting Joint Swelling.  Symptoms began several weeks ago.  Interventions attempted: Other: states eliquis  was d/c'd.  Symptoms are: gradually improving.  Triage Disposition: See Today or Tomorrow in Office (overriding See PCP When Office is Open (Within 3 Days))  Patient/caregiver understands and will follow disposition?: Yes Reason for Disposition  [1] Small area of localized swelling AND [2] not better after 3 days  Answer Assessment - Initial Assessment Questions 1. ONSET: When did the swelling start? (e.g., minutes, hours, days)     3 weeks 2. LOCATION: What part of the arm is swollen?  Are both arms swollen or just one arm?     Right hand, mainly fingers 3. SEVERITY: How bad is the swelling? (e.g., localized; mild, moderate, severe)     Mild 4. REDNESS: Is there redness or signs of infection?     Denies 5. PAIN: Is the swelling painful to touch? If Yes, ask: How painful is it?   (Scale 1-10; mild, moderate or severe)     Tender only when sleeping 6. FEVER: Do you have a fever? If Yes, ask: What is it, how was it measured, and when did it start?      Denies  Protocols used: Arm Swelling and Edema-A-AH

## 2024-11-20 NOTE — Telephone Encounter (Signed)
 Appreciate Dr Cleatus seeing this pleasant patient.

## 2024-11-21 ENCOUNTER — Ambulatory Visit: Admitting: Family Medicine

## 2024-11-21 ENCOUNTER — Encounter: Payer: Self-pay | Admitting: Family Medicine

## 2024-11-21 VITALS — BP 120/68 | HR 65 | Temp 97.9°F | Ht 69.0 in | Wt 131.5 lb

## 2024-11-21 DIAGNOSIS — M7989 Other specified soft tissue disorders: Secondary | ICD-10-CM | POA: Diagnosis not present

## 2024-11-21 NOTE — Patient Instructions (Signed)
 I would elevate your arm and hand when possible.   As long as your hand is getting better, I wouldn't do anything else.  It should gradually get better.

## 2024-11-21 NOTE — Progress Notes (Unsigned)
 Off eliquis  due to prev arm hematoma.  H/o A flutter.  Has been off eliquis  for ~27 days.    WBC usually >200 at baseline.   HGB mildly low at baseline.   Recently with R hand swelling, that is improving.  ROM is better now at the hand.  No hand pain but stiff due to tissue fullness.  No FCNAVD.  No other bleeding.    Hand swelling is getting better but slower than the R forearm swelling.    He has cardiology f/u pending.   Meds, vitals, and allergies reviewed.   ROS: Per HPI unless specifically indicated in ROS section   [place]ad [place]ck supple Normal radial pulses bilaterally. R hand puffy but not bruised or erythematous.  Normal capillary refill.

## 2024-11-22 DIAGNOSIS — M7989 Other specified soft tissue disorders: Secondary | ICD-10-CM | POA: Insufficient documentation

## 2024-11-22 NOTE — Assessment & Plan Note (Signed)
 Likely dependent edema related to previous hematoma.  Normal radial pulses.  He is hand is improving. I asked him to elevate his arm and hand when possible.   As long as his hand is getting better, I wouldn't do anything else.  He can update us  as needed.  He agrees to plan.

## 2024-11-25 ENCOUNTER — Telehealth: Payer: Self-pay | Admitting: Family Medicine

## 2024-11-25 NOTE — Telephone Encounter (Signed)
 Plz notify - I touched base with cardiology and hematology and they both agree we should stop elqiuis at this time.  I see he's seen cards since I last saw pt and he's been referred to afib doctor to discuss possible watchman procedure.

## 2024-11-27 NOTE — Telephone Encounter (Addendum)
 Left message to return call to our office.  Please provide  message from provider/office when call is returned from patient.

## 2024-11-27 NOTE — Telephone Encounter (Unsigned)
 Copied from CRM #[id number]. Topic: Clinical - Medical Advice >> Nov 27, 2024  1:58 PM Kevelyn M wrote: Reason for CRM: patient calling back because he said someone called him. Patient was read provider notes but he stated he was already told this information.  Patient is stating he said since he's seen doctor last Tuesday, condition with right had has not gotten any worse or better. He's still having issues moving his fingers. He's asking what he should do.  Call back #[phone]

## 2024-11-28 NOTE — Telephone Encounter (Unsigned)
 Copied from CRM [id number]. Topic: General - Other >> Nov 28, 2024  2:50 PM Donna BRAVO wrote: Reason for CRM: patient returning missed call Ready note verbatim:  Rilla Baller, MD  Physician Family Medicine   Telephone Encounter Signed   Encounter Date: 11/25/2024  Signed   Called pt, straight to voicemail, unable to leave message.  Please contact pt - ensure he continues elevating arm as able, that there's no warmth or streaking redness, and that arm swelling continues improving.  Would offer OV for re evaluation and/or occupational therapy referral for assistance with hand swelling.   Patient will keep elevating arm, no warmth to touch, no streaking, swelling is the same. Patient will call office if anything changes

## 2024-11-28 NOTE — Telephone Encounter (Signed)
 Called pt, straight to voicemail, unable to leave message.  Please contact pt - ensure he continues elevating arm as able, that there's no warmth or streaking redness, and that arm swelling continues improving.  Would offer OV for re evaluation and/or occupational therapy referral for assistance with hand swelling.

## 2024-11-29 ENCOUNTER — Other Ambulatory Visit: Payer: Self-pay | Admitting: Family Medicine

## 2024-11-29 DIAGNOSIS — E1169 Type 2 diabetes mellitus with other specified complication: Secondary | ICD-10-CM

## 2024-12-08 ENCOUNTER — Encounter: Payer: Self-pay | Admitting: Cardiology

## 2024-12-08 ENCOUNTER — Ambulatory Visit: Attending: Cardiology | Admitting: Cardiology

## 2024-12-08 VITALS — BP 162/74 | HR 62 | Ht 69.0 in | Wt 136.3 lb

## 2024-12-08 DIAGNOSIS — R233 Spontaneous ecchymoses: Secondary | ICD-10-CM | POA: Diagnosis not present

## 2024-12-08 DIAGNOSIS — D6869 Other thrombophilia: Secondary | ICD-10-CM

## 2024-12-08 DIAGNOSIS — I483 Typical atrial flutter: Secondary | ICD-10-CM | POA: Diagnosis not present

## 2024-12-08 NOTE — Progress Notes (Signed)
 Electrophysiology Office Note:   Date:  12/08/2024  ID:  Keith Jordan, DOB [birth year], MRN [id number]  Primary Cardiologist: [name] Tobb, DO Electrophysiologist: Fonda Kitty, MD      History of Present Illness:   Keith Jordan is a 84 y.o. 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 Sheena.  Discussed the use of AI scribe software for clinical note transcription with the patient, who gave verbal consent to proceed.  History of Present Illness Keith Jordan is an 83 year old male with atrial fibrillation who presents for evaluation of spontaneous hematomas and discussion of anticoagulation management.  He has a significant hematoma on his hand that developed spontaneously six weeks ago. The hematoma is large, causing swelling, stiffness, and numbness, which have persisted. He is unable to use his right hand for eating due to these symptoms, impacting his daily activities as he is right-handed. He was previously on Eliquis , which was discontinued due to the hematoma. He has experienced similar spontaneous hematomas in the past, including one in his leg approximately two and a half years ago.  He has a history of atrial fibrillation and underwent an ablation procedure. Since the ablation, he has not experienced any abnormal heart rhythms, such as fluttering, skipping, or pounding. He was initially placed on Eliquis  due to his atrial fibrillation but has been off the medication for over 40 days. His heart rhythm has felt normal since discontinuing Eliquis .  His blood pressure was measured as high during a recent visit, but his home measurements are typically around 120-125/60, which he considers normal.   Review of systems complete and found to be negative unless listed in HPI.   EP Information / Studies Reviewed:          Zio 11/2022:   Echo 05/21/24:  1. Left ventricular ejection fraction, by estimation, is 70 to 75%. The  left ventricle has hyperdynamic function. The left ventricle has no  regional wall motion abnormalities. Left ventricular diastolic parameters  are consistent with Grade I diastolic  dysfunction (impaired relaxation).   2. Right ventricular systolic function is normal. The right ventricular  size is mildly enlarged. There is mildly elevated pulmonary artery  systolic pressure. The estimated right ventricular systolic pressure is  36.4 mmHg.   3. The mitral valve is normal in structure. No evidence of mitral valve  regurgitation. No evidence of mitral stenosis.   4. The aortic valve is normal in structure. Aortic valve regurgitation is  not visualized. No aortic stenosis is present.   5. The inferior vena cava is normal in size with greater than 50%  respiratory variability, suggesting right atrial pressure of 3 mmHg.   Risk Assessment/Calculations:    CHA2DS2-VASc Score = 2   This indicates a 2.2% annual risk of stroke. The patient's score is based upon: CHF History: 0 HTN History: 0 Diabetes History: 0 Stroke History: 0 Vascular Disease History: 0 Age Score: 2 Gender Score: 0         Physical Exam:   VS:  BP (!) 162/74 (BP Location: Left Arm, Patient Position: Sitting, Cuff Size: Normal)   Pulse 62   Ht 5' 9 (1.753 m)   Wt 136 lb 4.8 oz (61.8 kg)   SpO2 95%   BMI 20.13 kg/m    Wt Readings from Last 3 Encounters:  12/08/24 136 lb 4.8 oz (61.8  kg)  11/21/24 131 lb 8 oz (59.6 kg)  11/09/24 134 lb 12.8 oz (61.1 kg)     General: Well developed, in no acute distress.  Neck: No JVD.  Cardiac: Normal rate, regular rhythm.  Resp: Normal work of breathing.  Ext: No edema.  Right hand swollen. Neuro: No gross focal deficits.  Psych: Normal affect.    ASSESSMENT AND PLAN:     Assessment & Plan #.  Typical atrial flutter status post CTI ablation: Patient denies  any known recurrence since his ablation with Dr. Inocencio in 2022.  Limited ECGs are available for review but all appear to show sinus rhythm. -Continue carvedilol  6.25 mg twice daily.  #.  Secondary hypercoagulable state due to atrial flutter: #.  Recurrent spontaneous soft tissue /muscular hematomas. - Patient is currently off oral anticoagulation due to history of recurrent spontaneous hematomas.  He was initially sent to me for consideration of Watchman device.  Patient denies any history of atrial arrhythmia since having successful flutter ablation years ago.  I have not been able to find any documentation of subsequent arrhythmias.  If atrial flutter has been cured, which it appears to have been, he would not need long-term anticoagulation.  We did discuss that because of his history of atrial flutter he is at high risk for subsequent atrial fibrillation, although we have not yet seen this.  I believe it is worthwhile to monitor for development of atrial fibrillation since he is going to remain off oral anticoagulation at this time.  We discussed role of loop recorder for monitoring for atrial fibrillation.  Discussed risks and benefits of loop recorder implant.  Patient voiced understanding elected to proceed.  If he were to have AF on loop recorder, then watchman implant would be more appropriate at that time.   Follow up with Dr. Kennyth for loop recorder implant.   Signed, Fonda Kennyth, MD

## 2024-12-08 NOTE — Patient Instructions (Signed)
 Medication Instructions:  Your physician recommends that you continue on your current medications as directed. Please refer to the Current Medication list given to you today.  *If you need a refill on your cardiac medications before your next appointment, please call your pharmacy*  Testing/Procedures: Loop Recorder Implant - this will be done at your next office visit. There are no restrictions or special instructions prior to this visit. Please note that you will not be able to shower for 72 hours after the procedure.   Follow-Up: At [facility], you and your health needs are our priority.  As part of our continuing mission to provide you with exceptional heart care, our providers are all part of one team.  This team includes your primary Cardiologist (physician) and Advanced Practice Providers or APPs (Physician Assistants and Nurse Practitioners) who all work together to provide you with the care you need, when you need it.  Your next appointment:     Provider:   Fonda Kitty, MD

## 2024-12-12 ENCOUNTER — Ambulatory Visit: Admitting: Family Medicine

## 2024-12-12 NOTE — Telephone Encounter (Signed)
 Resulted by provider on 11/17.

## 2024-12-18 ENCOUNTER — Ambulatory Visit: Payer: Self-pay

## 2024-12-18 DIAGNOSIS — S40021D Contusion of right upper arm, subsequent encounter: Secondary | ICD-10-CM

## 2024-12-18 DIAGNOSIS — M7989 Other specified soft tissue disorders: Secondary | ICD-10-CM

## 2024-12-18 DIAGNOSIS — R200 Anesthesia of skin: Secondary | ICD-10-CM

## 2024-12-18 DIAGNOSIS — C8307 Small cell B-cell lymphoma, spleen: Secondary | ICD-10-CM

## 2024-12-18 NOTE — Telephone Encounter (Signed)
 Noted. Ensure no burning or other pain to the right hand.  Agree with OV for evaluation for this next week, I also recommend  referral to occupational therapy in [facility] for this as well. Referral placed.

## 2024-12-18 NOTE — Telephone Encounter (Signed)
 No answer no voice mail

## 2024-12-18 NOTE — Telephone Encounter (Signed)
 FYI Only or Action Required?: FYI only for provider: appointment scheduled on 12.29.25- due to ongoing issue, he preferred to stick with Dr. KANDICE. .  Patient was last seen in primary care on 11/21/2024 by Cleatus Arlyss RAMAN, MD.  Called Nurse Triage reporting Numbness.  Symptoms began several months ago.  Interventions attempted: Other: elevation.  Symptoms are: were gradually improving, now no improvement, no worsening. .  Triage Disposition: See PCP When Office is Open (Within 3 Days)  Patient/caregiver understands and will follow disposition?: Yes  Copied from CRM [id number]. Topic: Clinical - Red Word Triage >> Dec 18, 2024 10:08 AM Avram KANDICE wrote: Red Word that prompted transfer to Nurse Triage: numbness in fingers has been seen for this 3 times   ----------------------------------------------------------------------- From previous Reason for Contact - Scheduling: Patient/patient representative is calling to schedule an appointment. Refer to attachments for appointment information. Reason for Disposition  [1] Numbness or tingling in one or both hands AND [2] is a chronic symptom (recurrent or ongoing AND present > 4 weeks)  Answer Assessment - Initial Assessment Questions Pt has ongoing fingertip numbness and hand pain in his right hand x 7 weeks. States he has been seen for this 3 times. He states that he still can't close his hand completely and grip things. He is using special utensils with larger handles. He states that the numbness in his finger tips is the most bothersome. He states he was told if it was getting better then nothing else needed to be done. He states that he has been elevating it but that he feels like it was starting to get better but the last two weeks it has not been getting any better. He denies any higher acuity symptoms, no worsening of symptoms in any way.  Chart review shows this is likely due to a hematoma, he states the hematoma is about 90% gone the only  part left is in the hand. He thinks there might still be blood in his fingers and that's causing the stiffness, pain and numbness.     1. SYMPTOM: What is the main symptom you are concerned about? (e.g., weakness, numbness)     Numbness in right hand finger tips all but one finger.  2. ONSET: When did this start? (e.g., minutes, hours, days; while sleeping)    7 weeks, has been seen multiple times 3. LAST NORMAL: When was the last time you (the patient) were normal (no symptoms)?     7 weeks ago 4. PATTERN Does this come and go, or has it been constant since it started?  Is it present now?     constant 5. CARDIAC SYMPTOMS: Have you had any of the following symptoms: chest pain, difficulty breathing, palpitations?     denies 6. NEUROLOGIC SYMPTOMS: Have you had any of the following symptoms: headache, dizziness, vision loss, double vision, changes in speech, unsteady on your feet?     denies 7. OTHER SYMPTOMS: Do you have any other symptoms?     Hand stiffness  Protocols used: Neurologic Deficit-A-AH

## 2024-12-20 NOTE — Telephone Encounter (Signed)
Voice mail full. Not able to leave message.

## 2024-12-20 NOTE — Telephone Encounter (Signed)
 Patient returning call from the clinic. Read PCP's note. Patient states no new or worsening symptoms, no burning. He states more like numbness and he states he does experience joint pain in his hand when he elevates the hand like he was advised to do. He states the hand is sensitive to cold as well but denies any other pain. Patient confirmed appt on 12/25/24 8am with PCP and is aware occupational therapy referral is pending.

## 2024-12-25 ENCOUNTER — Encounter: Payer: Self-pay | Admitting: Family Medicine

## 2024-12-25 ENCOUNTER — Ambulatory Visit
Admission: RE | Admit: 2024-12-25 | Discharge: 2024-12-25 | Disposition: A | Source: Ambulatory Visit | Attending: Family Medicine | Admitting: Family Medicine

## 2024-12-25 ENCOUNTER — Ambulatory Visit: Admitting: Family Medicine

## 2024-12-25 VITALS — BP 130/66 | HR 84 | Temp 98.9°F | Ht 69.0 in | Wt 133.2 lb

## 2024-12-25 DIAGNOSIS — M79641 Pain in right hand: Secondary | ICD-10-CM | POA: Insufficient documentation

## 2024-12-25 DIAGNOSIS — C8307 Small cell B-cell lymphoma, spleen: Secondary | ICD-10-CM

## 2024-12-25 DIAGNOSIS — S40021S Contusion of right upper arm, sequela: Secondary | ICD-10-CM

## 2024-12-25 DIAGNOSIS — J22 Unspecified acute lower respiratory infection: Secondary | ICD-10-CM | POA: Insufficient documentation

## 2024-12-25 MED ORDER — NAPROXEN 375 MG PO TABS: 375.0000 mg | ORAL_TABLET | Freq: Two times a day (BID) | ORAL | 0 refills | Status: AC

## 2024-12-25 MED ORDER — AZITHROMYCIN 250 MG PO TABS: ORAL_TABLET | ORAL | 0 refills | Status: DC

## 2024-12-25 NOTE — Patient Instructions (Addendum)
 Use topical voltaren gel over the counter throughout right hand may do this 3 times a day.  Start anti inflammatory naprosyn  375mg  twice daily with meals for 1 week then as needed.  Schedule appointment with occupational therapy: [facility] [address] [place],  [place]  [address] Main: [phone]  Return to see me in 6 weeks, let me know sooner if worsening symptoms.

## 2024-12-25 NOTE — Assessment & Plan Note (Addendum)
 Persistent pain and swelling of right hand as sequelae of spontaneous severe hematoma of right upper extremity.  Rec topical voltaren, Rx naprosyn  375mg  BID PRN x1 wk with meals then PRN.  Pending OT eval. Given marked swelling of joints, update right hand xray eval arthritis burden. Pt agrees with plan.  Avoid prednisone  at this time in diabetes history.

## 2024-12-25 NOTE — Assessment & Plan Note (Addendum)
 Suffered spontaneous hematoma to R forearm 09/2024, since then eliquis  discontinued.  No signs of infection See above for treatment plan. Pending occupational therapy evaluation.

## 2024-12-25 NOTE — Progress Notes (Addendum)
 " Ph: [phone] Fax: [phone]   Patient ID: Keith Jordan, male    DOB: 1941-04-02, 83 y.o.   MRN: [id number]  This visit was conducted in person.  BP 130/66 (Patient Position: Sitting, Cuff Size: Normal)   Pulse 84   Temp 98.9 F (37.2 C) (Oral)   Ht 5' 9 (1.753 m)   Wt 133 lb 3.2 oz (60.4 kg)   SpO2 96%   BMI 19.67 kg/m    CC: R hand pain marikay /numbness  Subjective:   HPI: Keith Jordan is a 83 y.o. male presenting on 12/25/2024 for Acute Visit (Right hand pain and finger numbness, onset 2 months ago, no known injury/Pt is unable to grasp small objects/Pt states right hand is sensitive to cold )   See prior notes for details.  H/o recurrent spontaneous soft tissue/muscle hematomas (L thigh 04/2023, R forearm 09/2024). Suffered spontaneous hematoma  to right upper extremity in setting of eliquis  use and known splenic marginal zone lymphoma with WBC 200's followed by Dr Federico hematology. Severe swelling at that time, concern for compression of radial artery, saw vascular and orthopedic surgery during hospitalization. Now off eliquis .   Notes persistent swelling to dorsal hand as well as persistent stiffness without burning pain. 2nd and 3rd ventral digits of right hand remain numb, thumb a little numb as well. Mild discomfort persists. Hand feels weak. Has had trouble making fist, gripping steering wheel. Notes numbness develop when he elevates hand.  No shoulder or neck pain, no cervical radiculopathy. Denies inciting trauma/injury or falls.  Worse in evenings. Wears sock at night time to keep hand warm.   Pending occupational therapy evaluation for this.   Was on eliquis  for atrial flutter - s/p CTI ablation 2022. Planned loop recorder then decision on watchman procedure. Sees EP Dr Kennyth and cardiologist Dr Dub Huntsman.   Known diabetic - diet controlled. Notes elevated readings over the holidays. Most recently 130 fasting last week.  Lab Results  Component  Value Date   HGBA1C 6.7 (H) 10/25/2024     By the way... 3d h/o head cold symptoms - congestion, RN, sore throat, hoarseness, chills.  No fevers, ear or tooth pain, body aches, fatigue, abd pain, nausea, diarrhea or cough.  Treating with nyquil. + recent sick contacts.      Relevant past medical, surgical, family and social history reviewed and updated as indicated. Interim medical history since our last visit reviewed. Allergies and medications reviewed and updated. Outpatient Medications Prior to Visit  Medication Sig Dispense Refill   acetaminophen  (TYLENOL ) 500 MG tablet Take 2 tablets (1,000 mg total) by mouth 3 (three) times daily. (Patient taking differently: Take 1,000 mg by mouth as needed.)     Ascorbic Acid (VITAMIN C) 1000 MG tablet Take 500 mg by mouth 2 (two) times daily. (Patient taking differently: Take 500 mg by mouth as needed.)     atorvastatin  (LIPITOR) 10 MG tablet TAKE 1 TABLET BY MOUTH EVERY DAY 90 tablet 4   carboxymethylcellulose (REFRESH PLUS) 0.5 % SOLN Place 1 drop into both eyes 2 (two) times daily as needed (dry eyes).     carvedilol  (COREG ) 6.25 MG tablet TAKE 1 TABLET BY MOUTH TWICE A DAY 180 tablet 2   melatonin 3 MG TABS tablet Take 3 mg by mouth at bedtime. (Patient taking differently: Take 3 mg by mouth as needed.)     Multiple Vitamins-Minerals (MULTIVITAMIN WITH MINERALS) tablet Take 1 tablet by mouth daily. Unknown strenght (  Patient taking differently: Take 1 tablet by mouth as needed. Unknown strenght)     Multiple Vitamins-Minerals (PRESERVISION AREDS) CAPS Take 1 capsule by mouth in the morning and at bedtime. (Patient taking differently: Take 1 capsule by mouth as needed.) 180 capsule 3   Olopatadine  HCl 0.2 % SOLN Apply 1 drop to eye daily. (Both) 2.5 mL 0   sodium chloride  (OCEAN) 0.65 % SOLN nasal spray Place 1 spray into both nostrils as needed for congestion.     triamcinolone  (NASACORT ) 55 MCG/ACT AERO nasal inhaler Place 1 spray into the nose  daily as needed (sinus congestion). 1 each    No facility-administered medications prior to visit.     Per HPI unless specifically indicated in ROS section below Review of Systems  Objective:  BP 130/66 (Patient Position: Sitting, Cuff Size: Normal)   Pulse 84   Temp 98.9 F (37.2 C) (Oral)   Ht 5' 9 (1.753 m)   Wt 133 lb 3.2 oz (60.4 kg)   SpO2 96%   BMI 19.67 kg/m   Wt Readings from Last 3 Encounters:  12/25/24 133 lb 3.2 oz (60.4 kg)  12/08/24 136 lb 4.8 oz (61.8 kg)  11/21/24 131 lb 8 oz (59.6 kg)      Physical Exam Vitals and nursing note reviewed.  Constitutional:      Appearance: Normal appearance. He is not ill-appearing.  HENT:     Head: Normocephalic and atraumatic.     Right Ear: Ear canal and external ear normal.     Left Ear: Ear canal and external ear normal.     Ears:     Comments: Wearing hearing aides    Nose: Congestion and rhinorrhea present. No mucosal edema.     Comments: Wearing mask    Mouth/Throat:     Mouth: Mucous membranes are moist.     Pharynx: Oropharynx is clear. Posterior oropharyngeal erythema present. No oropharyngeal exudate.     Tonsils: No tonsillar exudate.  Eyes:     Extraocular Movements: Extraocular movements intact.     Conjunctiva/sclera: Conjunctivae normal.     Pupils: Pupils are equal, round, and reactive to light.  Cardiovascular:     Rate and Rhythm: Normal rate and regular rhythm.     Pulses: Normal pulses.     Heart sounds: Normal heart sounds. No murmur heard. Pulmonary:     Effort: Pulmonary effort is normal. No respiratory distress.     Breath sounds: Normal breath sounds. No wheezing, rhonchi or rales.     Comments: Lungs clear Musculoskeletal:        General: Swelling present. No tenderness.     Cervical back: Normal range of motion and neck supple. No rigidity.     Right lower leg: No edema.     Left lower leg: No edema.     Comments:  L arm WNL  2+ rad pulses bilaterally Swelling to R hand IP/MCP  joints Stiffness of hand due to this swelling No erythema/warmth of joints FROM at R elbow and wrist  FROM at R shoulder  No significant pain at scaphoid, 1st CMC, wrist bones  Lymphadenopathy:     Cervical: No cervical adenopathy.  Skin:    General: Skin is warm and dry.     Findings: No rash.  Neurological:     Mental Status: He is alert.     Sensory: Sensation is intact.     Comments:  Sensation largely intact Neg phalen/tinel  Diminished strength due to swelling  Psychiatric:        Mood and Affect: Mood normal.        Behavior: Behavior normal.       Results for orders placed or performed in visit on 11/09/24  CBC with Differential/Platelet   Collection Time: 11/09/24 10:14 AM  Result Value Ref Range   WBC 227.0 (HH) 3.4 - 10.8 x10E3/uL   RBC 3.25 (L) 4.14 - 5.80 x10E6/uL   Hemoglobin 9.4 (L) 13.0 - 17.7 g/dL   Hematocrit 67.5 (L) 62.4 - 51.0 %   MCV 100 (H) 79 - 97 fL   MCH 28.9 26.6 - 33.0 pg   MCHC 29.0 (L) 31.5 - 35.7 g/dL   RDW 84.9 88.3 - 84.5 %   Platelets 229 150 - 450 x10E3/uL   Neutrophils 3 Not Estab. %   Lymphs 88 Not Estab. %   Monocytes 9 Not Estab. %   Eos 0 Not Estab. %   Basos 0 Not Estab. %   Neutrophils Absolute 5.4 1.4 - 7.0 x10E3/uL   Lymphocytes Absolute 198.9 (H) 0.7 - 3.1 x10E3/uL   Monocytes Absolute 21.1 (H) 0.1 - 0.9 x10E3/uL   EOS (ABSOLUTE) 0.5 (H) 0.0 - 0.4 x10E3/uL   Basophils Absolute 0.3 (H) 0.0 - 0.2 x10E3/uL   Immature Granulocytes 0 Not Estab. %   Immature Grans (Abs) 0.0 0.0 - 0.1 x10E3/uL   Hematology Comments: Note:    Lab Results  Component Value Date   HGBA1C 6.7 (H) 10/25/2024   Lab Results  Component Value Date   NA 141 10/26/2024   CL 102 10/26/2024   K 3.7 10/26/2024   CO2 28 10/26/2024   BUN 30 (H) 10/26/2024   CREATININE 0.89 10/26/2024   GFRNONAA >60 10/26/2024   CALCIUM  9.8 10/26/2024   ALBUMIN 4.0 10/26/2024   GLUCOSE 128 (H) 10/26/2024    Assessment & Plan:   Problem List Items Addressed  This Visit     Splenic marginal zone b-cell lymphoma (HCC)   Relevant Medications   naproxen  (NAPROSYN ) 375 MG tablet   azithromycin  (ZITHROMAX ) 250 MG tablet   Hematoma of arm, right, subsequent encounter   Suffered spontaneous hematoma to R forearm 09/2024, since then eliquis  discontinued.  No signs of infection See above for treatment plan. Pending occupational therapy evaluation.       Right hand pain - Primary   Persistent pain and swelling of right hand as sequelae of spontaneous severe hematoma of right upper extremity.  Rec topical voltaren, Rx naprosyn  375mg  BID PRN x1 wk with meals then PRN.  Pending OT eval. Given marked swelling of joints, update right hand xray eval arthritis burden. Pt agrees with plan.  Avoid prednisone  at this time in diabetes history.       Relevant Orders   DG Hand Complete Right   Acute respiratory infection   Anticipate viral given short duration. Supportive measures reviewed. Given lymphoma status, WASP for zpack antibiotic printed out for patient with indications when to fill - ie if develops fever/chills, worsening productive cough, if ongoing symptoms past 7 days, if any acute worsening in interim.       Relevant Medications   azithromycin  (ZITHROMAX ) 250 MG tablet     Meds ordered this encounter  Medications   naproxen  (NAPROSYN ) 375 MG tablet    Sig: Take 1 tablet (375 mg total) by mouth 2 (two) times daily with a meal. For 7 days then as needed    Dispense:  30 tablet    Refill:  0   azithromycin  (ZITHROMAX ) 250 MG tablet    Sig: Take two tablets on day one followed by one tablet on days 2-5    Dispense:  6 each    Refill:  0    Orders Placed This Encounter  Procedures   DG Hand Complete Right    Standing Status:   Future    Number of Occurrences:   1    Expiration Date:   12/25/2025    Reason for Exam (SYMPTOM  OR DIAGNOSIS REQUIRED):   persistent right hand joint swelling after spontaneous hematoma to RUE    Preferred  imaging location?:   [facility]    Patient Instructions  Use topical voltaren gel over the counter throughout right hand may do this 3 times a day.  Start anti inflammatory naprosyn  375mg  twice daily with meals for 1 week then as needed.  Schedule appointment with occupational therapy: [facility] [address] [place]  [address] [place],  [place]  [address] Main: [phone]  Return to see me in 6 weeks, let me know sooner if worsening symptoms.   Follow up plan: Return in about 6 weeks (around 02/05/2025) for follow up visit.  Anton Blas, MD   "

## 2024-12-25 NOTE — Assessment & Plan Note (Addendum)
 Anticipate viral given short duration. Supportive measures reviewed. Given lymphoma status, WASP for zpack antibiotic printed out for patient with indications when to fill - ie if develops fever/chills, worsening productive cough, if ongoing symptoms past 7 days, if any acute worsening in interim.

## 2025-01-01 ENCOUNTER — Other Ambulatory Visit: Payer: Self-pay | Admitting: Hematology and Oncology

## 2025-01-01 DIAGNOSIS — C8307 Small cell B-cell lymphoma, spleen: Secondary | ICD-10-CM

## 2025-01-02 ENCOUNTER — Telehealth: Payer: Self-pay

## 2025-01-02 ENCOUNTER — Inpatient Hospital Stay: Attending: Hematology and Oncology

## 2025-01-02 DIAGNOSIS — D696 Thrombocytopenia, unspecified: Secondary | ICD-10-CM | POA: Insufficient documentation

## 2025-01-02 DIAGNOSIS — Z8572 Personal history of non-Hodgkin lymphomas: Secondary | ICD-10-CM | POA: Insufficient documentation

## 2025-01-02 DIAGNOSIS — Z7901 Long term (current) use of anticoagulants: Secondary | ICD-10-CM | POA: Diagnosis not present

## 2025-01-02 DIAGNOSIS — I4892 Unspecified atrial flutter: Secondary | ICD-10-CM | POA: Insufficient documentation

## 2025-01-02 DIAGNOSIS — C8307 Small cell B-cell lymphoma, spleen: Secondary | ICD-10-CM

## 2025-01-02 LAB — LACTATE DEHYDROGENASE: LDH: 195 U/L (ref 105–235)

## 2025-01-02 LAB — CMP (CANCER CENTER ONLY)
ALT: 7 U/L (ref 0–44)
AST: 28 U/L (ref 15–41)
Albumin: 3.8 g/dL (ref 3.5–5.0)
Alkaline Phosphatase: 86 U/L (ref 38–126)
Anion gap: 7 (ref 5–15)
BUN: 22 mg/dL (ref 8–23)
CO2: 30 mmol/L (ref 22–32)
Calcium: 9.2 mg/dL (ref 8.9–10.3)
Chloride: 101 mmol/L (ref 98–111)
Creatinine: 0.79 mg/dL (ref 0.61–1.24)
GFR, Estimated: >60 mL/min
Glucose, Bld: 115 mg/dL — ABNORMAL HIGH (ref 70–99)
Potassium: 4 mmol/L (ref 3.5–5.1)
Sodium: 139 mmol/L (ref 135–145)
Total Bilirubin: 0.3 mg/dL (ref 0.0–1.2)
Total Protein: 6.6 g/dL (ref 6.5–8.1)

## 2025-01-02 LAB — CBC WITH DIFFERENTIAL (CANCER CENTER ONLY)
Abs Immature Granulocytes: 1.45 K/uL — ABNORMAL HIGH (ref 0.00–0.07)
Basophils Absolute: 0.1 K/uL (ref 0.0–0.1)
Basophils Relative: 0 %
Eosinophils Absolute: 0.7 K/uL — ABNORMAL HIGH (ref 0.0–0.5)
Eosinophils Relative: 0 %
HCT: 34.6 % — ABNORMAL LOW (ref 39.0–52.0)
Hemoglobin: 10.9 g/dL — ABNORMAL LOW (ref 13.0–17.0)
Immature Granulocytes: 1 %
Lymphocytes Relative: 93 %
Lymphs Abs: 221 K/uL — ABNORMAL HIGH (ref 0.7–4.0)
MCH: 30.8 pg (ref 26.0–34.0)
MCHC: 31.5 g/dL (ref 30.0–36.0)
MCV: 97.7 fL (ref 80.0–100.0)
Monocytes Absolute: 9.7 K/uL — ABNORMAL HIGH (ref 0.1–1.0)
Monocytes Relative: 4 %
Neutro Abs: 5.1 K/uL (ref 1.7–7.7)
Neutrophils Relative %: 2 %
Platelet Count: 177 K/uL (ref 150–400)
RBC: 3.54 MIL/uL — ABNORMAL LOW (ref 4.22–5.81)
RDW: 16.2 % — ABNORMAL HIGH (ref 11.5–15.5)
Smear Review: NORMAL
WBC Count: 238 K/uL (ref 4.0–10.5)
nRBC: 0 % (ref 0.0–0.2)

## 2025-01-02 NOTE — Telephone Encounter (Signed)
 CRITICAL VALUE STICKER  CRITICAL VALUE:WBC [id number]  RECEIVER (on-site recipient of call):Ronal Pray, RN  DATE & TIME NOTIFIED: 01/02/25  12:05 PM  MESSENGER (representative from lab):Jeoffrey RAMAN  MD NOTIFIED: Dr Madge Silvan, RN  TIME OF NOTIFICATION:12:18PM  RESPONSE:  information acknowledged.

## 2025-01-04 ENCOUNTER — Other Ambulatory Visit: Payer: Self-pay | Admitting: Cardiology

## 2025-01-04 NOTE — Therapy (Signed)
 " OUTPATIENT OCCUPATIONAL THERAPY ORTHO EVALUATION  Patient Name: Keith Jordan MRN: [id number] DOB:07-13-1941, 84 y.o., male Today's Date: 01/04/2025  PCP:  Rilla Baller, MD   REFERRING PROVIDER:  Rilla Baller, MD    END OF SESSION:   Past Medical History:  Diagnosis Date   Abnormal breath sounds 10/11/2020   Acute sinusitis 01/29/2021   Advanced care planning/counseling discussion 09/01/2016   BPH (benign prostatic hyperplasia) 09/29/2018   Ear fullness, bilateral 04/03/2021   Health maintenance examination 09/01/2016   Hyperlipidemia    Irregular heart beat 03/24/2021   Ischemic optic neuropathy 2006   [facility] at [facility]   Leukocytosis 01/30/2021   Severe s/p ER eval 01/2021   Medicare annual wellness visit, subsequent 10/04/2019   Nausea without vomiting 03/24/2021   Personal history of colonic adenoma 07/30/2008   Prediabetes 07/05/2016   A1c 6.5% 03/2014    Prediabetes 07/05/2016   A1c 6.5% 03/2014    Primary osteoarthritis of knee    bilateral s/p L knee replacement, receives R knee injections libbie, [name])   Sebaceous cyst 01/30/2020   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   Past Surgical History:  Procedure Laterality Date   A-FLUTTER ABLATION N/A 07/11/2021   Procedure: A-FLUTTER ABLATION;  Surgeon: Inocencio Soyla Lunger, MD;  Location: [facility];  Service: Cardiovascular;  Laterality: N/A;   COLONOSCOPY  12/2013   no polyps, no rpt due Ollen)   CYST EXCISION  01/2020   TOTAL KNEE ARTHROPLASTY Left 2005   [name]   Patient Active Problem List   Diagnosis Date Noted   Right hand pain 12/25/2024   Acute respiratory infection 12/25/2024   Hand swelling 11/22/2024   Hematoma of arm, right, subsequent encounter 10/26/2024   Acute pain of left shoulder 09/14/2024   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 11/09/2023   Fuchs' corneal dystrophy of both eyes 11/09/2023   Carotid atherosclerosis 11/08/2023   Pulsatile tinnitus  11/01/2023   Thigh hematoma, left, initial encounter 05/04/2023   Chronic anticoagulation 05/04/2023   Thrombocytopenia 05/04/2023   First degree AV block 04/26/2023   Chronic venous insufficiency of lower extremity 02/12/2023   Cellulitis of left leg 02/05/2023   Allergic rhinitis 05/29/2022   Insomnia 11/17/2021   Nocturia 11/17/2021   Elevated blood pressure reading 11/17/2021   Typical atrial flutter (HCC) 10/13/2021   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   Abnormal breath sounds 10/11/2020   Medicare annual wellness visit, subsequent 10/04/2019   BPH (benign prostatic hyperplasia) 09/29/2018   Encounter for general adult medical examination with abnormal findings 09/01/2016   Advanced care planning/counseling discussion 09/01/2016   Type 2 diabetes mellitus with other specified complication (HCC) 07/05/2016   Hyperlipidemia associated with type 2 diabetes mellitus (HCC)    Primary osteoarthritis of knee    Ischemic optic neuropathy    History of colonic polyps 07/30/2008   S/P TKR (total knee replacement), left 2005    ONSET DATE: Chronic onset   REFERRING DIAG:  M79.89 (ICD-10-CM) - Hand swelling  S40.021D (ICD-10-CM) - Hematoma of arm, right, subsequent encounter  R20.0 (ICD-10-CM) - Hand numbness    THERAPY DIAG:  No diagnosis found.  Rationale for Evaluation and Treatment: Rehabilitation  SUBJECTIVE:   SUBJECTIVE STATEMENT: Apparently he developed spontaneous hematoma while on anticoagulants in Fall 2025 causing swelling, stiffness, numbness, etc in Rt hand. He states ***.    Pt accompanied by: {accompnied:27141}  PERTINENT HISTORY: Per MD: Chronic R hand swelling, stiffness and numbness after  large spontaneous hematoma while on AC earlier this fall, now off eliquis .   PRECAUTIONS: {Therapy precautions:24002}  RED FLAGS: {PT Red Flags:29287}   WEIGHT BEARING RESTRICTIONS: {Yes ***/No:24003}  PAIN:  Are you having pain? Yes: NPRS  scale: *** Pain location: *** Pain description: *** Aggravating factors: *** Relieving factors: ***  FALLS: Has patient fallen in last 6 months? {fallsyesno:27318}  LIVING ENVIRONMENT: Lives with: {OPRC lives with:25569::lives with their family} Lives in: {Lives in:25570} Stairs: {opstairs:27293} Has following equipment at home: {Assistive devices:23999}  PLOF: {PLOF:24004}  PATIENT GOALS: ***  NEXT MD VISIT: ***   OBJECTIVE: (All objective assessments below are from initial evaluation on: 01/08/25 unless otherwise specified.)   HAND DOMINANCE: Right ***  ADLs: Overall ADLs: States decreased ability to grab, hold household objects, pain and difficulty to open containers, perform FMS tasks (manipulate fasteners on clothing), mild to moderate bathing problems as well. ***   FUNCTIONAL OUTCOME MEASURES: Eval: Patient Specific Functional Scale: *** (***, ***, ***)  (Higher Score  =  Better Ability for the Selected Tasks)     Quick DASH ***% impairment today  (Higher % Score  =  More Impairment)     Patient Rated Wrist Evaluation (PRWE): Pain: ***/50; Function: ***/50; Total Score: ***/100 (Higher Score  =  More Pain and/or Debility)    UPPER EXTREMITY ROM     Shoulder to Wrist AROM Right eval Left eval  Forearm supination    Forearm pronation     Wrist flexion    Wrist extension    Wrist ulnar deviation    Wrist radial deviation    Functional dart thrower's motion (F-DTM) in ulnar flexion    F-DTM in radial extension     (Blank rows = not tested)   Hand AROM Right eval Left eval  Full Fist Ability (or Gap to Distal Palmar Crease)    Thumb Opposition  (Kapandji Scale)     Thumb MCP (0-60)    Thumb IP (0-80)    Thumb Radial Abduction Span     Thumb Palmar Abduction Span     Index MCP (0-90)     Index PIP (0-100)     Index DIP (0-70)      Long MCP (0-90)      Long PIP (0-100)      Long DIP (0-70)      Ring MCP (0-90)      Ring PIP (0-100)      Ring  DIP (0-70)      Little MCP (0-90)      Little PIP (0-100)      Little DIP (0-70)      (Blank rows = not tested)   UPPER EXTREMITY MMT:     MMT Right 01/08/25 Left 01/08/25  Elbow flexion    Elbow extension    Forearm supination    Forearm pronation    Wrist flexion    Wrist extension    Wrist ulnar deviation    Wrist radial deviation    (Blank rows = not tested)  HAND FUNCTION: Eval: Observed weakness in affected *** hand.  Grip strength Right: *** lbs, Left: *** lbs   COORDINATION: Eval: Observed coordination impairments with affected *** hand. Box and Blocks Test: *** Blocks today (*** is [facility]); 9 Hole Peg Test Right: ***sec, Left: *** sec (*** sec is WFL)   SENSATION: Eval:  Light touch intact today,   *** though diminished around sx area    EDEMA:   Eval: *** Mildly swollen  in *** hand and wrist today, ***cm circumferentially around ***  COGNITION: Eval: Overall cognitive status: WFL for evaluation today ***  OBSERVATIONS:   Eval: ***   TODAY'S TREATMENT:  Post-evaluation treatment: ***     PATIENT EDUCATION: Education details: See tx section above for details  Person educated: Patient Education method: Engineer, Structural, Teach back, Handouts  Education comprehension: States and demonstrates understanding, Additional Education required    HOME EXERCISE PROGRAM: See tx section above for details    GOALS: Goals reviewed with patient? Yes   SHORT TERM GOALS: (STG required if POC>30 days) Target Date: ***  Pt will obtain protective, custom orthotic. Goal status: TBD/PRN,  MET ***  2.  Pt will demo/state understanding of initial HEP to improve pain levels and prerequisite motion. Goal status: INITIAL   LONG TERM GOALS: Target Date: ***  Pt will improve functional ability by decreased impairment per PSFS assessment from *** to *** or better, for better quality of life. Goal status: INITIAL  2.  Pt will improve grip strength in *** hand from  ***lbs to at least ***lbs for functional use at home and in IADLs. Goal status: INITIAL  3.  Pt will improve A/ROM in *** from *** to at least ***, to have functional motion for tasks like reach and grasp.  Goal status: INITIAL  4.  Pt will improve strength in *** from *** MMT to at least *** MMT to have increased functional ability to carry out selfcare and higher-level homecare tasks with less difficulty. Goal status: INITIAL  5.  Pt will improve coordination skills in ***, as seen by within functional limit score on *** testing to have increased functional ability to carry out fine motor tasks (fasteners, etc.) and more complex, coordinated IADLs (meal prep, sports, etc.).  Goal status: INITIAL  6.  Pt will decrease pain at worst from ***/10 to ***/10 or better to have better sleep and occupational participation in daily roles. Goal status: INITIAL   ASSESSMENT:  CLINICAL IMPRESSION: Patient is a 84 y.o. male who was seen today for occupational therapy evaluation for ***.  The patient will benefit from outpatient occupational therapy to decrease symptoms, improve functional upper extremity use, and increase quality of life.  PERFORMANCE DEFICITS: in functional skills including {OT physical skills:25468}, cognitive skills including problem solving and safety awareness, and psychosocial skills including coping strategies, environmental adaptation, habits, and routines and behaviors.   IMPAIRMENTS: are limiting patient from ADLs, IADLs, rest and sleep, and leisure.   COMORBIDITIES: {Comorbidities:25485} that affects occupational performance. Patient will benefit from skilled OT to address above impairments and improve overall function.  MODIFICATION OR ASSISTANCE TO COMPLETE EVALUATION: {OT modification:25474}  OT OCCUPATIONAL PROFILE AND HISTORY: {OT PROFILE AND HISTORY:25484}  CLINICAL DECISION MAKING: {OT CDM:25475}  REHAB POTENTIAL: {rehabpotential:25112}  EVALUATION  COMPLEXITY: {Evaluation complexity:25115}      PLAN:  OT FREQUENCY: 1-2x/week  OT DURATION: *** weeks through *** and up to *** total visits as needed   PLANNED INTERVENTIONS: 97535 self care/ADL training, 02889 therapeutic exercise, 97530 therapeutic activity, 97112 neuromuscular re-education, 97140 manual therapy, 97035 ultrasound, [id number] electrical stimulation (manual), [id number] Orthotic Initial, [id number] Orthotic/Prosthetic subsequent, compression bandaging, Dry needling, energy conservation, coping strategies training, and patient/family education  RECOMMENDED OTHER SERVICES: none now  ***  CONSULTED AND AGREED WITH PLAN OF CARE: Patient  PLAN FOR NEXT SESSION:   Review initial HEP and recommendations ***   Melvenia Ada, OTR/L, CHT  01/04/2025, 2:57 PM   "

## 2025-01-08 ENCOUNTER — Ambulatory Visit: Admitting: Rehabilitative and Restorative Service Providers"

## 2025-01-08 ENCOUNTER — Encounter: Payer: Self-pay | Admitting: Rehabilitative and Restorative Service Providers"

## 2025-01-08 ENCOUNTER — Ambulatory Visit: Payer: Self-pay | Admitting: Family Medicine

## 2025-01-08 DIAGNOSIS — M25631 Stiffness of right wrist, not elsewhere classified: Secondary | ICD-10-CM | POA: Diagnosis not present

## 2025-01-08 DIAGNOSIS — M6281 Muscle weakness (generalized): Secondary | ICD-10-CM | POA: Diagnosis not present

## 2025-01-08 DIAGNOSIS — R278 Other lack of coordination: Secondary | ICD-10-CM | POA: Diagnosis not present

## 2025-01-08 DIAGNOSIS — M25641 Stiffness of right hand, not elsewhere classified: Secondary | ICD-10-CM | POA: Diagnosis not present

## 2025-01-08 DIAGNOSIS — R600 Localized edema: Secondary | ICD-10-CM | POA: Diagnosis not present

## 2025-01-08 DIAGNOSIS — R202 Paresthesia of skin: Secondary | ICD-10-CM

## 2025-01-11 NOTE — Telephone Encounter (Signed)
A letter is being sent to the last known home address.

## 2025-01-12 ENCOUNTER — Telehealth: Payer: Self-pay | Admitting: Hematology and Oncology

## 2025-01-12 ENCOUNTER — Telehealth: Payer: Self-pay

## 2025-01-12 DIAGNOSIS — I483 Typical atrial flutter: Secondary | ICD-10-CM

## 2025-01-12 NOTE — Addendum Note (Signed)
 Addended by: MAGDALINE NEEDLE on: 01/12/2025 04:28 PM   Modules accepted: Orders

## 2025-01-12 NOTE — Telephone Encounter (Signed)
 Attempted to call patient in regards to denial for loop recorder implant. Insurance is requesting a 30 day monitor. Unable to leave a message. Will try again at a later time.

## 2025-01-12 NOTE — Telephone Encounter (Signed)
 I spoke to pt about rescheduling appt.

## 2025-01-16 NOTE — Telephone Encounter (Signed)
 Spoke with the patient and advised him on denial of loop recorder and need for a heart monitor first. Patient verbalized understanding. Monitor has been ordered and appointment has been cancelled.

## 2025-01-23 ENCOUNTER — Ambulatory Visit: Admitting: Rehabilitative and Restorative Service Providers"

## 2025-01-23 ENCOUNTER — Encounter: Payer: Self-pay | Admitting: Rehabilitative and Restorative Service Providers"

## 2025-01-23 DIAGNOSIS — R278 Other lack of coordination: Secondary | ICD-10-CM

## 2025-01-23 DIAGNOSIS — M25631 Stiffness of right wrist, not elsewhere classified: Secondary | ICD-10-CM | POA: Diagnosis not present

## 2025-01-23 DIAGNOSIS — R202 Paresthesia of skin: Secondary | ICD-10-CM

## 2025-01-23 DIAGNOSIS — R600 Localized edema: Secondary | ICD-10-CM | POA: Diagnosis not present

## 2025-01-23 DIAGNOSIS — M25641 Stiffness of right hand, not elsewhere classified: Secondary | ICD-10-CM

## 2025-01-23 DIAGNOSIS — M6281 Muscle weakness (generalized): Secondary | ICD-10-CM

## 2025-01-23 NOTE — Therapy (Signed)
 " OUTPATIENT OCCUPATIONAL THERAPY TREATMENT NOTE Patient Name: Keith Jordan MRN: [id number] DOB:11/02/1941, 84 y.o., male Today's Date: 01/23/2025  PCP:  Rilla Baller, MD   REFERRING PROVIDER:  Rilla Baller, MD    END OF SESSION:  OT End of Session - 01/23/25 1342     Visit Number 2    Number of Visits 6    Date for Recertification  02/23/25    Authorization Type [employer]    OT Start Time 1342    OT Stop Time 1429    OT Time Calculation (min) 47 min    Activity Tolerance Patient tolerated treatment well;No increased pain;Patient limited by fatigue    Behavior During Therapy [facility] for tasks assessed/performed           Past Medical History:  Diagnosis Date   Abnormal breath sounds 10/11/2020   Acute sinusitis 01/29/2021   Advanced care planning/counseling discussion 09/01/2016   BPH (benign prostatic hyperplasia) 09/29/2018   Ear fullness, bilateral 04/03/2021   Health maintenance examination 09/01/2016   Hyperlipidemia    Irregular heart beat 03/24/2021   Ischemic optic neuropathy 2006   [facility] at [facility]   Leukocytosis 01/30/2021   Severe s/p ER eval 01/2021   Medicare annual wellness visit, subsequent 10/04/2019   Nausea without vomiting 03/24/2021   Personal history of colonic adenoma 07/30/2008   Prediabetes 07/05/2016   A1c 6.5% 03/2014    Prediabetes 07/05/2016   A1c 6.5% 03/2014    Primary osteoarthritis of knee    bilateral s/p L knee replacement, receives R knee injections libbie, [name])   Sebaceous cyst 01/30/2020   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   Past Surgical History:  Procedure Laterality Date   A-FLUTTER ABLATION N/A 07/11/2021   Procedure: A-FLUTTER ABLATION;  Surgeon: Inocencio Soyla Lunger, MD;  Location: [facility];  Service: Cardiovascular;  Laterality: N/A;   COLONOSCOPY  12/2013   no polyps, no rpt due Ollen)   CYST EXCISION  01/2020   TOTAL KNEE ARTHROPLASTY Left 2005   [name]   Patient Active Problem  List   Diagnosis Date Noted   Right hand pain 12/25/2024   Acute respiratory infection 12/25/2024   Hand swelling 11/22/2024   Hematoma of arm, right, subsequent encounter 10/26/2024   Acute pain of left shoulder 09/14/2024   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 11/09/2023   Fuchs' corneal dystrophy of both eyes 11/09/2023   Carotid atherosclerosis 11/08/2023   Pulsatile tinnitus 11/01/2023   Thigh hematoma, left, initial encounter 05/04/2023   Chronic anticoagulation 05/04/2023   Thrombocytopenia 05/04/2023   First degree AV block 04/26/2023   Chronic venous insufficiency of lower extremity 02/12/2023   Cellulitis of left leg 02/05/2023   Allergic rhinitis 05/29/2022   Insomnia 11/17/2021   Nocturia 11/17/2021   Elevated blood pressure reading 11/17/2021   Typical atrial flutter (HCC) 10/13/2021   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   Abnormal breath sounds 10/11/2020   Medicare annual wellness visit, subsequent 10/04/2019   BPH (benign prostatic hyperplasia) 09/29/2018   Encounter for general adult medical examination with abnormal findings 09/01/2016   Advanced care planning/counseling discussion 09/01/2016   Type 2 diabetes mellitus with other specified complication (HCC) 07/05/2016   Hyperlipidemia associated with type 2 diabetes mellitus (HCC)    Primary osteoarthritis of knee    Ischemic optic neuropathy    History of colonic polyps 07/30/2008   S/P TKR (total knee replacement), left 2005    ONSET DATE: ~2.5  months ago  REFERRING DIAG:  M79.89 (ICD-10-CM) - Hand swelling  S40.021D (ICD-10-CM) - Hematoma of arm, right, subsequent encounter  R20.0 (ICD-10-CM) - Hand numbness    THERAPY DIAG:  Localized edema  Muscle weakness (generalized)  Other lack of coordination  Stiffness of right hand, not elsewhere classified  Paresthesia of skin  Stiffness of right wrist, not elsewhere classified  Rationale for Evaluation and  Treatment: Rehabilitation   PERTINENT HISTORY: Per MD: Chronic R hand swelling, stiffness and numbness after large spontaneous hematoma while on AC earlier this fall, now off eliquis .  Apparently he developed spontaneous hematoma while on anticoagulants in Fall 2025 causing swelling, stiffness, numbness, etc in Rt hand. He states his right hand has been very stiff and also feeling numb in digits 1 through 3.  Some of this is better as time goes on, but his lingering swelling, stiffness and paresthesia concerns him.  It makes daily activities more difficult.  He states that digits 4 and 5 are moving better, also not feeling numb.  PRECAUTIONS: None  RED FLAGS: None   WEIGHT BEARING RESTRICTIONS: No   SUBJECTIVE:   SUBJECTIVE STATEMENT: He states opening a jar is now easier, but he still having numbness and tingling.  He believes that he is doing his exercises correctly but does have some difficulty relaxing at times.SABRA   PAIN:  Are you having pain? no pain at the moment     PATIENT GOALS: Improve stiffness and paresthesia in the right hand to have better functional ability  NEXT MD VISIT: As needed   OBJECTIVE: (All objective assessments below are from initial evaluation on: 01/08/25 unless otherwise specified.)   HAND DOMINANCE: Right   ADLs: Overall ADLs: States decreased ability to grab, hold household objects, pain and difficulty to open containers, perform FMS tasks (manipulate fasteners on clothing), etc.   FUNCTIONAL OUTCOME MEASURES: Eval: QuickDASH: 30% impairment today.    UPPER EXTREMITY ROM     Shoulder to Wrist AROM Right eval Left eval Rt 01/23/25  Forearm supination     Forearm pronation      Wrist flexion 41 51 44  Wrist extension 54 84 52  Wrist ulnar deviation     Wrist radial deviation     Functional dart thrower's motion (F-DTM) in ulnar flexion     F-DTM in radial extension      (Blank rows = not tested)   Hand AROM Right eval Left eval  Rt 01/23/25  Full Fist Ability (or Gap to Distal Palmar Crease) 6.5cm gap from tip of MF to [facility], all fingers are stiff with difficulty to make a fist, fingers also have a lack of full extension Full fist  5.9cm gap  (4.8cm after manual therapy today)   Thumb Opposition  (Kapandji Scale)  6/10 8/10 7/10  Thumb MCP (0-60) 35 39 34  Thumb IP (0-80) 35 47 27  (Blank rows = not tested)   HAND FUNCTION: 01/23/25: Grip Rt: 6#   Eval: Observed weakness in affected Rt hand.  Grip strength Right: 8.2 lbs, Left: 60 lbs   COORDINATION: Eval: Observed coordination impairments with affected Rt hand. 9 Hole Peg Test Right: 54sec ( ~35 sec is WFL)   SENSATION: 01/23/25:  S2PDT- Rt Median was 7-64mm; Rt Ulnar 4-5mm   Eval:  Light touch intact diminished in Rt hand median nerve   S2PDT- Rt Median was 7-49mm; Rt Ulnar 4mm; Lt median and ulnar 4mm   EDEMA:   01/23/25: 20cm around MCPJs of  Rt hand Eval:  Mildly swollen in Rt hand today, 20.2cm circumferentially around Rt hand MCP Js compared to 19.2cm in Lt hand   OBSERVATIONS:   Eval: Numbness through digits 1 through 3 in the right hand with decreased sensory testing.  Also apparent swelling and stiffness to the right hand, possibly due to disuse during the recovery process from hematoma, or simply exacerbated by underlying arthritis and other issues.   He presents as a right hand swelling, stiffness, tightness as well as median nerve paresthesia/possible carpal tunnel syndrome   TODAY'S TREATMENT:  01/23/25: He starts with active range of motion for exercise as well as new measures that shows some slight improvements in wrist motion and finger flexion, but generally the same for thumb motion, grip strength.  He does state that his symptoms are improving and that functional ability is also improving.  He states that paresthesia is getting a bit better as well, though sensory check today shows approximately the same as 2 weeks ago.  Again OT reviews  nerve gliding and expectations that paresthesias can take months to improve-we just want him to be on the right path.  To help with that, we review his home exercises making some adjustments where needed, he will start using a towel roll for grip rather than a squeeze ball, he was also introduced to fine motor skills and hand manipulations as listed below to help with use of the hand dexterity, etc.  He does fairly well with these though struggling with his stiffness and poor coordination.  He states understanding how to do these things and was recommended to make a follow-up again in 2 weeks.  He should keep working consistently every day.  OT reviewed his stretches with him until post manual therapy measurements today which did yield significantly better results with finger motion.  This is encouraging and he should do these things every day to make consistent progress.   In-Hand Manipulation Skills Rotation:  Hold pen, try to twirl like a baton, keeping parallel (or flat) with surface of table. Try going BOTH directions 10x  Flip:  Hold pen in writing position,  flip in an arch to erase position, then back to write position. Do not lift hand off table.  10x  Translation:  Open hand palm up,  put an object in your palm and then use your fingers and thumb to move it to the tips of your fingers, pinched against your thumb. (bigger is easier (fat marker), smaller is harder (penny)) 10x  Shift:  Hold pen like a dart, start shifting it forward & backwards from tip to base (like putting a key in a key hole) 10x      PATIENT EDUCATION: Education details: See tx section above for details  Person educated: Patient Education method: Verbal Instruction, Teach back, Handouts  Education comprehension: States and demonstrates understanding, Additional Education required    HOME EXERCISE PROGRAM: Access Code: [email or web address] URL: [email or web address] Date: 01/08/2025 Prepared by:  Melvenia Ada   GOALS: Goals reviewed with patient? Yes   SHORT TERM GOALS: (STG required if POC>30 days) Target Date: 01/08/2025  Pt will obtain mobilization, custom orthotic. Goal status: TBD/PRN  2.  Pt will demo/state understanding of initial HEP to improve pain levels and prerequisite motion. Goal status: 01/23/25: MET   LONG TERM GOALS: Target Date: 02/23/2025  Pt will improve functional ability by decreased impairment per QuickDASH assessment from 30% to 15% or better, for better quality of life. Goal status: INITIAL  2.  Pt will improve grip strength in right hand from 8.2lbs to at least 25lbs for functional use at home and in IADLs. Goal status: INITIAL  3.  Pt will improve A/ROM in right wrist flexion/extension from 41/54 degrees respectively to at least 55 degrees each, to have functional motion for tasks like reach and grasp.  Goal status: INITIAL  4.  Pt will improve coordination skills in right hand, as seen by within functional limit score on nine-hole peg testing to have increased functional ability to carry out fine motor tasks (fasteners, etc.) and more complex, coordinated IADLs (meal prep, sports, etc.).  Goal status: INITIAL    ASSESSMENT:  CLINICAL IMPRESSION: 01/23/25: He is making significant improvements, just very slowly.  Manual therapy increased finger flexion by more than a centimeter after only a few minutes today.  This is encouraging that if he consistently does HEP and continues to use his hand for light functional activities his function will continue to improve.  It will take time as do all nerve injuries.  Eval: Patient is a 84 y.o. male who was seen today for occupational therapy evaluation for stiffness, swelling, weakness, paresthesia in the right hand after spontaneous hematoma, complications with possible carpal tunnel syndrome and decreased use of the right hand.  The patient will benefit from outpatient occupational therapy to decrease  symptoms, improve functional upper extremity use, and increase quality of life.   PLAN:  OT FREQUENCY: Up to 1 time a week   OT DURATION: 6 weeks through 02/23/2025 and up to 6 total visits as needed   PLANNED INTERVENTIONS: 97535 self care/ADL training, 02889 therapeutic exercise, 97530 therapeutic activity, 97112 neuromuscular re-education, 97140 manual therapy, 97035 ultrasound, 97032 electrical stimulation (manual), 97760 Orthotic Initial, [id number] Orthotic/Prosthetic subsequent, compression bandaging, Dry needling, energy conservation, coping strategies training, and patient/family education  CONSULTED AND AGREED WITH PLAN OF CARE: Patient  PLAN FOR NEXT SESSION:   Check back in 2 weeks about hand stiffness, median nerve paresthesia.  Check fine motor skills and in hand manipulation   Melvenia Ada, OTR/L, CHT  01/23/2025, 4:06 PM   "

## 2025-01-25 ENCOUNTER — Ambulatory Visit: Admitting: Cardiology

## 2025-01-31 ENCOUNTER — Ambulatory Visit: Payer: Self-pay

## 2025-01-31 ENCOUNTER — Ambulatory Visit: Admitting: Cardiology

## 2025-01-31 NOTE — Telephone Encounter (Signed)
 Please advise.

## 2025-01-31 NOTE — Telephone Encounter (Signed)
 Would offer appt with next available provider tomorrow or Friday. If scheduled, may cancel appt with me Monday.

## 2025-01-31 NOTE — Telephone Encounter (Signed)
 FYI Only or Action Required?: Action required by provider: request for appointment.  Patient was last seen in primary care on 12/25/2024 by Rilla Baller, MD.  Called Nurse Triage reporting Leg Injury.  Symptoms began a week ago.  Interventions attempted: Rest, hydration, or home remedies.  Symptoms are: bruising has improved per patient but swelling/redness/pain still present on anterior and lateral aspect of shin and calf.  Triage Disposition: See HCP Within 4 Hours (Or PCP Triage)  Patient/caregiver understands and will follow disposition?: No, wishes to speak with PCP     Message from Alexandria E sent at 01/31/2025 10:26 AM EST  Summary: Right leg swelling.   Reason for Triage: Piece of wood fell on patient and it hit his leg, right leg swelling.      Reason for Disposition  [1] Thigh, calf, or ankle swelling AND [2] only 1 side  Answer Assessment - Initial Assessment Questions Patient states that he was stacking wood a week ago and a piece of wood hit his leg He said the wood piece was about 8 inches in diameter and he hit his anterior aspect of his lower leg Patient states he can still walk fine This was right lower leg on anterior aspect and a little around the outside he states He states the swelling is slightly better Bruising was present and now there is just redness --1.5 inch circled area Pain on outside/lateral side of this right shin/calf area per patient  Patient denies any bleeding or cuts in this area, chest pain, difficulty breathing, fevers  Patient states he was on blood thinners but was taken off of them 2-3 months ago Patient states his phone battery is about to die and he was just checking to see if his PCP could see him any this week. He has an appointment to see his PCP on Monday 2/29/2026 for a follow up that was already scheduled with his PCP Dr Baller Rilla  Patient was advised having the injury with bruising and still having  swelling/pain in the right lower leg area---it is recommended to have this evaluated today within the next four hours. There are no available appointments at patient's PCP office. Offered patient an appointment at a surrounding PCP office within the region--patient declined this and states he will wait to see his PCP on Monday. Patient states if it gets worse he will go to Urgent Care   Patient is advised to call us  back if anything changes or with any further questions/concerns. Patient is advised that if anything worsens to go to the Emergency Room. Patient verbalized understanding.  Protocols used: Leg Swelling and Edema-A-AH

## 2025-02-01 NOTE — Telephone Encounter (Signed)
 Mail box full not able to leave message at mobile number was able to reach at home number. He states has improvement on symptoms. Redness is going down. He would like to wait until Monday and see PCP at scheduled appointment. Reviewed in detail all symptoms of infection. If any he will be seen sooner. Advised we have several appointment open at our office tomorrow if he changes his mind to give us  a call and we will get set up.

## 2025-02-01 NOTE — Telephone Encounter (Signed)
 Noted. Thanks.

## 2025-02-02 ENCOUNTER — Ambulatory Visit: Admitting: Family Medicine

## 2025-02-02 ENCOUNTER — Encounter: Payer: Self-pay | Admitting: Family Medicine

## 2025-02-02 NOTE — Telephone Encounter (Signed)
 Patient stated he received his heart monitor and has injured his right hand.  Patient stated he will need assistance putting on the device.

## 2025-02-02 NOTE — Progress Notes (Unsigned)
 "   Patient ID: Keith Jordan, male    DOB: 03-13-1941, 84 y.o.   MRN: [id number]  This visit was conducted in person.  BP 130/60   Pulse 61   Temp 99 F (37.2 C) (Temporal)   Ht 5' 9 (1.753 m)   Wt 137 lb 2 oz (62.2 kg)   SpO2 97%   BMI 20.25 kg/m    CC:  Chief Complaint  Patient presents with   Leg Pain    Hit RightShin 2 weeks ago.  Dr. KANDICE wanted someone to look at it     Subjective:   HPI: Keith Jordan is a 84 y.o. male presenting on 02/02/2025 for Leg Pain (Hit RightShin 2 weeks ago.  Dr. KANDICE wanted someone to look at it )   Hist right shin with a piece of wood. Had brusie and swelling... has decreasing in size.  Less redness, no heat.    Occ sharp pain.    No numbness in foot.       Relevant past medical, surgical, family and social history reviewed and updated as indicated. Interim medical history since our last visit reviewed. Allergies and medications reviewed and updated. Outpatient Medications Prior to Visit  Medication Sig Dispense Refill   acetaminophen  (TYLENOL ) 500 MG tablet Take 1,000 mg by mouth every 6 (six) hours as needed.     Ascorbic Acid (VITAMIN C) 1000 MG tablet Take 500 mg by mouth 2 (two) times daily as needed.     atorvastatin  (LIPITOR) 10 MG tablet TAKE 1 TABLET BY MOUTH EVERY DAY 90 tablet 4   carboxymethylcellulose (REFRESH PLUS) 0.5 % SOLN Place 1 drop into both eyes 2 (two) times daily as needed (dry eyes).     carvedilol  (COREG ) 6.25 MG tablet TAKE 1 TABLET BY MOUTH TWICE A DAY 180 tablet 2   melatonin 3 MG TABS tablet Take 3 mg by mouth at bedtime as needed.     Multiple Vitamins-Minerals (MULTIVITAMIN WITH MINERALS) tablet Take 1 tablet by mouth daily as needed. Unknown strenght     Multiple Vitamins-Minerals (PRESERVISION AREDS 2 PO) Take 1 capsule by mouth 2 (two) times daily as needed.     naproxen  (NAPROSYN ) 375 MG tablet Take 1 tablet (375 mg total) by mouth 2 (two) times daily with a meal. For 7 days then as needed 30  tablet 0   Olopatadine  HCl 0.2 % SOLN Apply 1 drop to eye daily. (Both) 2.5 mL 0   sodium chloride  (OCEAN) 0.65 % SOLN nasal spray Place 1 spray into both nostrils as needed for congestion.     triamcinolone  (NASACORT ) 55 MCG/ACT AERO nasal inhaler Place 1 spray into the nose daily as needed (sinus congestion). 1 each    azithromycin  (ZITHROMAX ) 250 MG tablet Take two tablets on day one followed by one tablet on days 2-5 6 each 0   acetaminophen  (TYLENOL ) 500 MG tablet Take 2 tablets (1,000 mg total) by mouth 3 (three) times daily. (Patient taking differently: Take 1,000 mg by mouth as needed.)     Multiple Vitamins-Minerals (PRESERVISION AREDS) CAPS Take 1 capsule by mouth in the morning and at bedtime. (Patient taking differently: Take 1 capsule by mouth as needed.) 180 capsule 3   No facility-administered medications prior to visit.     Per HPI unless specifically indicated in ROS section below Review of Systems Objective:  BP 130/60   Pulse 61   Temp 99 F (37.2 C) (Temporal)   Ht 5' 9 (1.753  m)   Wt 137 lb 2 oz (62.2 kg)   SpO2 97%   BMI 20.25 kg/m   Wt Readings from Last 3 Encounters:  02/02/25 137 lb 2 oz (62.2 kg)  12/25/24 133 lb 3.2 oz (60.4 kg)  12/08/24 136 lb 4.8 oz (61.8 kg)      Physical Exam    Results for orders placed or performed in visit on 01/02/25  Lactate dehydrogenase (LDH)   Collection Time: 01/02/25 11:44 AM  Result Value Ref Range   LDH 195 105 - 235 U/L  CMP ([facility] only)   Collection Time: 01/02/25 11:45 AM  Result Value Ref Range   Sodium 139 135 - 145 mmol/L   Potassium 4.0 3.5 - 5.1 mmol/L   Chloride 101 98 - 111 mmol/L   CO2 30 22 - 32 mmol/L   Glucose, Bld 115 (H) 70 - 99 mg/dL   BUN 22 8 - 23 mg/dL   Creatinine 9.20 9.38 - 1.24 mg/dL   Calcium  9.2 8.9 - 10.3 mg/dL   Total Protein 6.6 6.5 - 8.1 g/dL   Albumin 3.8 3.5 - 5.0 g/dL   AST 28 15 - 41 U/L   ALT 7 0 - 44 U/L   Alkaline Phosphatase 86 38 - 126 U/L   Total  Bilirubin 0.3 0.0 - 1.2 mg/dL   GFR, Estimated >39 >39 mL/min   Anion gap 7 5 - 15  CBC with Differential ([facility] Center Only)   Collection Time: 01/02/25 11:45 AM  Result Value Ref Range   WBC Count 238.0 (HH) 4.0 - 10.5 K/uL   RBC 3.54 (L) 4.22 - 5.81 MIL/uL   Hemoglobin 10.9 (L) 13.0 - 17.0 g/dL   HCT 65.3 (L) 60.9 - 47.9 %   MCV 97.7 80.0 - 100.0 fL   MCH 30.8 26.0 - 34.0 pg   MCHC 31.5 30.0 - 36.0 g/dL   RDW 83.7 (H) 88.4 - 84.4 %   Platelet Count 177 150 - 400 K/uL   nRBC 0.0 0.0 - 0.2 %   Neutrophils Relative % 2 %   Neutro Abs 5.1 1.7 - 7.7 K/uL   Lymphocytes Relative 93 %   Lymphs Abs 221.0 (H) 0.7 - 4.0 K/uL   Monocytes Relative 4 %   Monocytes Absolute 9.7 (H) 0.1 - 1.0 K/uL   Eosinophils Relative 0 %   Eosinophils Absolute 0.7 (H) 0.0 - 0.5 K/uL   Basophils Relative 0 %   Basophils Absolute 0.1 0.0 - 0.1 K/uL   WBC Morphology See Note    RBC Morphology MORPHOLOGY UNREMARKABLE    Smear Review Normal platelet morphology    Immature Granulocytes 1 %   Abs Immature Granulocytes 1.45 (H) 0.00 - 0.07 K/uL    Assessment and Plan  There are no diagnoses linked to this encounter.  No follow-ups on file.   Greig Ring, MD  "

## 2025-02-05 ENCOUNTER — Ambulatory Visit: Admitting: Family Medicine

## 2025-02-07 ENCOUNTER — Encounter: Admitting: Rehabilitative and Restorative Service Providers"

## 2025-03-30 ENCOUNTER — Inpatient Hospital Stay: Attending: Hematology and Oncology

## 2025-03-30 ENCOUNTER — Inpatient Hospital Stay: Admitting: Hematology and Oncology

## 2025-04-02 ENCOUNTER — Inpatient Hospital Stay

## 2025-04-02 ENCOUNTER — Inpatient Hospital Stay: Admitting: Hematology and Oncology

## 2025-05-01 ENCOUNTER — Ambulatory Visit: Admitting: Family Medicine

## 2025-07-02 ENCOUNTER — Inpatient Hospital Stay: Attending: Hematology and Oncology

## 2025-10-03 ENCOUNTER — Inpatient Hospital Stay: Attending: Hematology and Oncology | Admitting: Hematology and Oncology

## 2025-10-03 ENCOUNTER — Inpatient Hospital Stay

## 2025-10-25 ENCOUNTER — Ambulatory Visit

## 2025-10-26 ENCOUNTER — Ambulatory Visit
